# Patient Record
Sex: Female | Born: 1945 | Race: Asian | Hispanic: No | Marital: Married
Health system: Northeastern US, Academic
[De-identification: ages and names within clinical notes are randomized; demographics above are authoritative.]

## PROBLEM LIST (undated history)

## (undated) DIAGNOSIS — I10 Essential (primary) hypertension: Secondary | ICD-10-CM

## (undated) DIAGNOSIS — K219 Gastro-esophageal reflux disease without esophagitis: Secondary | ICD-10-CM

## (undated) DIAGNOSIS — E119 Type 2 diabetes mellitus without complications: Secondary | ICD-10-CM

## (undated) DIAGNOSIS — G629 Polyneuropathy, unspecified: Secondary | ICD-10-CM

## (undated) DIAGNOSIS — I739 Peripheral vascular disease, unspecified: Secondary | ICD-10-CM

## (undated) DIAGNOSIS — Z794 Long term (current) use of insulin: Secondary | ICD-10-CM

## (undated) DIAGNOSIS — E782 Mixed hyperlipidemia: Secondary | ICD-10-CM

## (undated) DIAGNOSIS — M1990 Unspecified osteoarthritis, unspecified site: Secondary | ICD-10-CM

## (undated) DIAGNOSIS — C801 Malignant (primary) neoplasm, unspecified: Secondary | ICD-10-CM

## (undated) DIAGNOSIS — Z923 Personal history of irradiation: Secondary | ICD-10-CM

## (undated) HISTORY — PX: SHOULDER ARTHROSCOPY: SHX128

## (undated) HISTORY — DX: Peripheral vascular disease, unspecified: I73.9

## (undated) HISTORY — DX: Essential (primary) hypertension: I10

## (undated) HISTORY — DX: Gastro-esophageal reflux disease without esophagitis: K21.9

## (undated) HISTORY — PX: SHOULDER OPEN ROTATOR CUFF REPAIR: SHX2407

---

## 1990-08-10 HISTORY — PX: VAGINAL HYSTERECTOMY: SUR661

## 1998-08-10 HISTORY — PX: BREAST BIOPSY: SHX20

## 1999-06-05 ENCOUNTER — Encounter: Payer: Self-pay | Admitting: General Surgery

## 1999-06-05 ENCOUNTER — Encounter (INDEPENDENT_AMBULATORY_CARE_PROVIDER_SITE_OTHER): Payer: Self-pay

## 1999-06-05 ENCOUNTER — Ambulatory Visit (HOSPITAL_COMMUNITY): Admission: RE | Admit: 1999-06-05 | Discharge: 1999-06-05 | Payer: Self-pay | Admitting: General Surgery

## 1999-06-17 ENCOUNTER — Ambulatory Visit (HOSPITAL_COMMUNITY): Admission: RE | Admit: 1999-06-17 | Discharge: 1999-06-17 | Payer: Self-pay | Admitting: General Surgery

## 1999-06-17 ENCOUNTER — Encounter: Payer: Self-pay | Admitting: General Surgery

## 1999-06-17 ENCOUNTER — Encounter (INDEPENDENT_AMBULATORY_CARE_PROVIDER_SITE_OTHER): Payer: Self-pay | Admitting: Specialist

## 1999-12-18 ENCOUNTER — Encounter: Admission: RE | Admit: 1999-12-18 | Discharge: 1999-12-18 | Payer: Self-pay | Admitting: Family Medicine

## 1999-12-18 ENCOUNTER — Encounter: Payer: Self-pay | Admitting: Family Medicine

## 2000-07-28 ENCOUNTER — Ambulatory Visit (HOSPITAL_BASED_OUTPATIENT_CLINIC_OR_DEPARTMENT_OTHER): Admission: RE | Admit: 2000-07-28 | Discharge: 2000-07-28 | Payer: Self-pay | Admitting: Orthopedic Surgery

## 2000-09-16 ENCOUNTER — Encounter: Admission: RE | Admit: 2000-09-16 | Discharge: 2000-12-15 | Payer: Self-pay | Admitting: Internal Medicine

## 2000-12-28 ENCOUNTER — Ambulatory Visit (HOSPITAL_COMMUNITY): Admission: RE | Admit: 2000-12-28 | Discharge: 2000-12-28 | Payer: Self-pay | Admitting: Family Medicine

## 2000-12-28 ENCOUNTER — Encounter: Payer: Self-pay | Admitting: Unknown Physician Specialty

## 2001-12-29 ENCOUNTER — Encounter: Payer: Self-pay | Admitting: Family Medicine

## 2001-12-29 ENCOUNTER — Ambulatory Visit (HOSPITAL_COMMUNITY): Admission: RE | Admit: 2001-12-29 | Discharge: 2001-12-29 | Payer: Self-pay | Admitting: Family Medicine

## 2002-06-16 ENCOUNTER — Ambulatory Visit (HOSPITAL_BASED_OUTPATIENT_CLINIC_OR_DEPARTMENT_OTHER): Admission: RE | Admit: 2002-06-16 | Discharge: 2002-06-16 | Payer: Self-pay | Admitting: Orthopedic Surgery

## 2003-01-04 ENCOUNTER — Ambulatory Visit (HOSPITAL_COMMUNITY): Admission: RE | Admit: 2003-01-04 | Discharge: 2003-01-04 | Payer: Self-pay | Admitting: *Deleted

## 2003-01-04 ENCOUNTER — Encounter: Payer: Self-pay | Admitting: *Deleted

## 2003-02-26 ENCOUNTER — Ambulatory Visit (HOSPITAL_COMMUNITY): Admission: RE | Admit: 2003-02-26 | Discharge: 2003-02-26 | Payer: Self-pay | Admitting: Gastroenterology

## 2005-06-19 ENCOUNTER — Ambulatory Visit (HOSPITAL_COMMUNITY): Admission: RE | Admit: 2005-06-19 | Discharge: 2005-06-19 | Payer: Self-pay | Admitting: *Deleted

## 2005-08-10 HISTORY — PX: WRIST SURGERY: SHX841

## 2005-10-06 ENCOUNTER — Ambulatory Visit: Payer: Self-pay | Admitting: Internal Medicine

## 2005-10-23 ENCOUNTER — Emergency Department (HOSPITAL_COMMUNITY): Admission: EM | Admit: 2005-10-23 | Discharge: 2005-10-23 | Payer: Self-pay | Admitting: Emergency Medicine

## 2008-03-15 ENCOUNTER — Ambulatory Visit (HOSPITAL_COMMUNITY): Admission: RE | Admit: 2008-03-15 | Discharge: 2008-03-15 | Payer: Self-pay | Admitting: Family Medicine

## 2009-04-04 ENCOUNTER — Ambulatory Visit (HOSPITAL_COMMUNITY): Admission: RE | Admit: 2009-04-04 | Discharge: 2009-04-04 | Payer: Self-pay | Admitting: Family Medicine

## 2010-04-02 ENCOUNTER — Ambulatory Visit (HOSPITAL_COMMUNITY): Admission: RE | Admit: 2010-04-02 | Discharge: 2010-04-02 | Payer: Self-pay | Admitting: Family Medicine

## 2010-09-17 ENCOUNTER — Other Ambulatory Visit: Payer: Self-pay | Admitting: Family Medicine

## 2010-09-17 DIAGNOSIS — N6310 Unspecified lump in the right breast, unspecified quadrant: Secondary | ICD-10-CM

## 2010-09-23 ENCOUNTER — Ambulatory Visit
Admission: RE | Admit: 2010-09-23 | Discharge: 2010-09-23 | Disposition: A | Discharge status: Home / Self Care | Payer: Federal, State, Local not specified - PPO | Source: Ambulatory Visit | Attending: Family Medicine | Admitting: Family Medicine

## 2010-09-23 ENCOUNTER — Other Ambulatory Visit: Payer: Self-pay | Admitting: Family Medicine

## 2010-09-23 DIAGNOSIS — N6310 Unspecified lump in the right breast, unspecified quadrant: Secondary | ICD-10-CM

## 2010-12-26 NOTE — Op Note (Signed)
   NAMEHIRAL, Gloria Mckinney                           ACCOUNT NO.:  [id number]   MEDICAL RECORD NO.:  [id number]                   PATIENT TYPE:  AMB   LOCATION:  ENDO                                 FACILITY:  [facility]   PHYSICIAN:  Anselmo Rod, M.D.               DATE OF BIRTH:  June 18, 1946   DATE OF PROCEDURE:  02/26/2003  DATE OF DISCHARGE:                                 OPERATIVE REPORT   PROCEDURE PERFORMED:  Esophagogastroduodenoscopy with biopsy.   ENDOSCOPIST:  Charna Elizabeth, M.D.   INSTRUMENT USED:  Olympus video panendoscope.   INDICATIONS FOR PROCEDURE:  The patient is a 65 year old Asian female with a  history of iron deficiency anemia.  Rule out peptic ulcer disease,  esophagitis, gastritis, etc.   PREPROCEDURE PREPARATION:  Informed consent was procured from the patient.  The patient was fasted for eight hours prior to the procedure.   PREPROCEDURE PHYSICAL:  The patient had stable vital signs.  Neck supple,  chest clear to auscultation.  S1, S2 regular.  Abdomen soft with normal  bowel sounds.   DESCRIPTION OF PROCEDURE:  The patient was placed in the left lateral  decubitus position and sedated with 50 mg of Demerol and 5 mg of Versed  intravenously.  Once the patient was adequately sedated and maintained on  low-flow oxygen and continuous cardiac monitoring, the Olympus video  panendoscope was advanced through the mouth piece over the tongue into the  esophagus under direct vision.  The entire esophagus appeared normal with no  evidence of ring, stricture, masses, esophagitis or Barrett's mucosa.  The  scope was then advanced to the stomach.  The entire gastric mucosa appeared  normal with no evidence of erosions, ulcerations, masses or polyps.  Retroflexion in the high cardia revealed no abnormalities.  The duodenal  bulb and the proximal small bowel distal to the bulb up to 16 cm appeared  normal.  There was no outlet obstruction.   IMPRESSION:  Normal  esophagogastroduodenoscopy.    RECOMMENDATIONS:  Proceed with a colonoscopy at this time.  Further  recommendations made after colonoscopy.                                                Anselmo Rod, M.D.    JNM/MEDQ  D:  02/26/2003  T:  02/26/2003  Job:  [id number]   cc:   Gabriel Earing, M.D.  [address]  [place]  [place] [address]  Fax: [phone]

## 2010-12-26 NOTE — Op Note (Signed)
   [name], Gloria Mckinney                           ACCOUNT NO.:  [id number]   MEDICAL RECORD NO.:  [id number]                   PATIENT TYPE:  AMB   LOCATION:  ENDO                                 FACILITY:  [facility]   PHYSICIAN:  Anselmo Rod, M.D.               DATE OF BIRTH:  21-Jul-1946   DATE OF PROCEDURE:  02/26/2003  DATE OF DISCHARGE:                                 OPERATIVE REPORT   PROCEDURE:  Screening colonoscopy.   ENDOSCOPIST:  Anselmo Rod, M.D.   INSTRUMENT USED:  Olympus video colonoscope.   INDICATIONS FOR PROCEDURE:  Iron deficiency anemia in a 65 year old [place] female, rule out colonic polyps, masses, etc.   PREPROCEDURE PREPARATION:  Informed consent was obtained from the patient  and the patient was  fasted for 8 hours prior to  the procedure and prepped  with a bottle of magnesium citrate and a gallon of GoLYTELY the  night prior  to the procedure.   PREPROCEDURE PHYSICAL:  The patient had stable vital signs. Neck supple.  Chest clear to auscultation, normal S1, S2. Abdomen soft with normoactive  bowel sounds.   DESCRIPTION OF PROCEDURE:  The patient was placed in the left lateral  decubitus position and sedated with Demerol and Versed for the EGD; no  additional sedation was used for the colonoscopy.   Once the patient was adequately positioned the Olympus video colonoscope was  advanced into the rectum to the cecum with slight difficulty. There was some  residual stool  on the right side of the colon. The appendiceal orifice and  ileocecal valve were clearly visualized and photographed. No masses, polyps,  erosions or ulcerations were seen. There was no evidence of diverticulosis.  Small  internal hemorrhoids were seen on retroflexion in the rectum.   IMPRESSION:  Normal colonoscopy except for small internal hemorrhoids. No  masses or polyps seen.    RECOMMENDATIONS:  1. Repeat CBCs will be done on an outpatient basis and iron  supplementation     provided if needed.  2. Repeat colorectal cancer screening in the next 10 years unless the     patient develops any abnormal symptoms in the interim.                                               Anselmo Rod, M.D.    JNM/MEDQ  D:  02/26/2003  T:  02/26/2003  Job:  [id number]   cc:   Gabriel Earing, M.D.  [address]  [place]  [place] [address]  Fax: [phone]

## 2010-12-26 NOTE — Op Note (Signed)
[place]  Patient:    Gloria Mckinney, Gloria Mckinney                        MRN: [id number] Proc. Date: 07/28/00 Adm. Date:  [id number] Attending:  Milly Jakob                           Operative Report  PREOPERATIVE DIAGNOSES:  Rotator cuff tear with impingement, acromioclavicular joint arthritis.  POSTOPERATIVE DIAGNOSES: 1. Rotator cuff tear with impingement, acromioclavicular joint arthritis. 2. Superior labral tearing anterior to posterior.  PRINCIPAL PROCEDURES: 1. Mini-open rotator cuff repair with three Mitek suture anchors. 2. Arthroscopic evaluation and subacromial decompression. 3. Debridement of superior labrum tear anterior to posterior and the    glenohumeral joint. 4. Partial distal clavicle excision.  SURGEON:  Harvie Junior, M.D.  ASSISTANT:  Currie Paris. Thedore Mins.  ANESTHESIA:  General.  BRIEF HISTORY:  This is a 65 year old female with a long history of having had significant right shoulder pain and weakness.  She had been in evaluated and felt to have significant weakness.  An injection had helped her pain some, but did not eliminate it.  Because of continued complaints of pain and weakness, she was sent through physical therapy.  When this failed, she was ultimately taken to the operating room for evaluation under anesthesia and arthroscopy.  PROCEDURE:  Patient taken to the operating room and after adequate anesthesia was obtained with general anesthetic, the patient placed supine on the operating table. She was then moved into the beach chair position.  All bony prominences were well-padded and care taken to position her without any pressure.  At this point, the right arm was prepped and draped in usual sterile fashion and following this, routine arthroscopic examination of the shoulder revealed there was a tear of the superior labrum anterior to posterior with fraying of the biceps tendon.  This was debrided back  to bleeding tissue within the glenohumeral joint.  Attention was then turned to the rotator cuff undersurface, which showed a connection to bone, but it was clear that this was floppy and it seemed like scar tissue.  The actual end of the rotator cuff could be identified undersurface.  No frank hole was seen to the undersurface. Attention was then turned superiorly, where there was noted to be an obvious defect in the rotator cuff.  An acromioplasty was performed. Anterolateral acromioplasty from both the anterior and side portal.  Following this, a distal clavicle excision was performed from an anterior portal of about 17 mm. Attention was then turned to the rotator cuff tear, where the camera was then removed from the shoulder joint.  Attention was then turned towards a mini-open.  Incision was made at the lateral aspect of the deltoid linear.  Subcutaneous tissues were taken down to level of the deltoid muscle, which was divided in line with its fibers.  The rotator cuff tear was identified, debrided, the tendinopathy was fairly consistent and showed that there was poor healing at the bone both in front and back of the rotator cuff tear.  This poor tendinous healing was resected and the attachment to bone was burred, three Superquick suture anchors were then used and the rotator cuff was repaired to this bleeding, bony bed.  Following this, the shoulder was put through a range of motion.  There was no tendency towards pull or stress on the rotator  cuff with the arm at the side.  The shoulder was then copiously irrigated and suctioned dry.  The deltoid muscle was then closed with 0 Vicryl interrupted suture.  Skin was then closed with a combination of 0 and 2-0 Vicryl and the skin with a 4-0 Prolene pullout suture.  Benzoin and Steri-Strips were applied.  A sterile compressive dressing was applied and the patient was placed into a shoulder sling.  She was taken to the recovery room, where  she was noted to be in satisfactory condition.  Estimated blood loss for the procedure was none. DD:  07/28/00 TD:  07/28/00 Job: [id number]

## 2010-12-26 NOTE — Op Note (Signed)
NAME:  Gloria Mckinney, Gloria Mckinney                           ACCOUNT NO.:  [id number]   MEDICAL RECORD NO.:  [id number]                   PATIENT TYPE:  AMB   LOCATION:  DSC                                  FACILITY:  [facility]   PHYSICIAN:  Harvie Junior, M.D.                DATE OF BIRTH:  [birth year]   DATE OF PROCEDURE:  DATE OF DISCHARGE:                                 OPERATIVE REPORT   PREOPERATIVE DIAGNOSES:  1. Impingement.  2. Acromioclavicular joint arthritis.  3. Suspected rotator cuff tear.   POSTOPERATIVE DIAGNOSES:  1. Impingement.  2. Acromioclavicular joint arthritis.  3. Anterior superior labral tear.   PROCEDURES:  1. Anterolateral acromioplasty.  2. Distal clavicle resection.  3. Debridement of anterior labral tear.   SURGEON:  Harvie Junior, M.D.   ASSISTANT:  Marshia Ly, P.A.   ANESTHESIA:  General.   BRIEF HISTORY:  The patient is a 65 year old female with a long history of  having right shoulder pain, ultimately had a full-thickness rotator cuff  tear, which was fixed through a mini-open approach.  She did well from this  repair and began having left shoulder pain.  She had a type 2 acromion  radiographically, failed all conservative care, and because of continued  complaints of pain came in for evaluation of the left shoulder.   DESCRIPTION OF PROCEDURE:  The patient was taken to the operating room and  after adequate anesthesia was obtained with a block, the patient was placed  supine on the operating table.  She was then moved to the beach chair  position, all bony prominences well-padded.  At this point attention was  turned into the glenohumeral joint, where there was noted to be an anterior  superior labral tear.  This was debrided from within the glenohumeral joint.  There was some tightness in the shoulder preoperatively, and this was  released with a manipulation.  The glenohumeral joint appeared to be  relatively open at that point.  Following  this, attention was turned into  the subacromial space, where after a superior labral resection attention was  turned to the subacromial space.  After checking the rotator cuff from  within the glenohumeral joint, it looked to be within normal limits.  At  this time attention was turned to the subacromial space, where there was a  large anterior lateral spur.  An anterior lateral acromioplasty was  performed, followed by distal clavicle resection of approximately 15 mm.  Once this was done, a significant bursectomy was performed from within the  subacromial space, and the rotator cuff was examined thoroughly at this  time.  The rotator cuff showed no evidence of full-thickness tearing and no  evidence of significant bursal side tearing.  At this point the shoulder was  copiously irrigated and suctioned dry.  The arthroscopic portals were closed  with a bandage, a sterile and compressive dressing  was applied, and the  patient was taken to the recovery room, where she was noted to be in  satisfactory condition in an arm sling.  She will be discharged to home, and  she will follow up in one week.                                               Harvie Junior, M.D.    Ranae Plumber  D:  06/16/2002  T:  06/16/2002  Job:  [id number]

## 2012-01-06 ENCOUNTER — Other Ambulatory Visit (HOSPITAL_COMMUNITY): Payer: Self-pay | Admitting: Family Medicine

## 2012-01-06 DIAGNOSIS — Z1231 Encounter for screening mammogram for malignant neoplasm of breast: Secondary | ICD-10-CM

## 2012-01-15 ENCOUNTER — Ambulatory Visit (HOSPITAL_COMMUNITY)
Admission: RE | Admit: 2012-01-15 | Discharge: 2012-01-15 | Disposition: A | Discharge status: Home / Self Care | Payer: Federal, State, Local not specified - PPO | Source: Ambulatory Visit | Attending: Family Medicine | Admitting: Family Medicine

## 2012-01-15 DIAGNOSIS — Z1231 Encounter for screening mammogram for malignant neoplasm of breast: Secondary | ICD-10-CM

## 2013-01-16 ENCOUNTER — Other Ambulatory Visit (HOSPITAL_COMMUNITY): Payer: Self-pay | Admitting: Family Medicine

## 2013-01-16 DIAGNOSIS — Z1231 Encounter for screening mammogram for malignant neoplasm of breast: Secondary | ICD-10-CM

## 2013-01-19 ENCOUNTER — Ambulatory Visit (HOSPITAL_COMMUNITY)
Admission: RE | Admit: 2013-01-19 | Discharge: 2013-01-19 | Disposition: A | Discharge status: Home / Self Care | Payer: Federal, State, Local not specified - PPO | Source: Ambulatory Visit | Attending: Family Medicine | Admitting: Family Medicine

## 2013-01-19 DIAGNOSIS — Z1231 Encounter for screening mammogram for malignant neoplasm of breast: Secondary | ICD-10-CM | POA: Insufficient documentation

## 2013-03-16 ENCOUNTER — Ambulatory Visit
Admission: RE | Admit: 2013-03-16 | Discharge: 2013-03-16 | Disposition: A | Discharge status: Home / Self Care | Payer: Federal, State, Local not specified - PPO | Source: Ambulatory Visit | Attending: Family Medicine | Admitting: Family Medicine

## 2013-03-16 ENCOUNTER — Other Ambulatory Visit: Payer: Self-pay | Admitting: Family Medicine

## 2013-03-16 DIAGNOSIS — R109 Unspecified abdominal pain: Secondary | ICD-10-CM

## 2013-12-25 ENCOUNTER — Other Ambulatory Visit (HOSPITAL_COMMUNITY): Payer: Self-pay | Admitting: Family Medicine

## 2013-12-25 DIAGNOSIS — Z1231 Encounter for screening mammogram for malignant neoplasm of breast: Secondary | ICD-10-CM

## 2014-01-22 ENCOUNTER — Ambulatory Visit (HOSPITAL_COMMUNITY)
Admission: RE | Admit: 2014-01-22 | Discharge: 2014-01-22 | Disposition: A | Discharge status: Home / Self Care | Payer: Federal, State, Local not specified - PPO | Source: Ambulatory Visit | Attending: Family Medicine | Admitting: Family Medicine

## 2014-01-22 ENCOUNTER — Ambulatory Visit (HOSPITAL_COMMUNITY): Payer: Federal, State, Local not specified - PPO

## 2014-01-22 DIAGNOSIS — Z1231 Encounter for screening mammogram for malignant neoplasm of breast: Secondary | ICD-10-CM | POA: Insufficient documentation

## 2014-10-19 ENCOUNTER — Other Ambulatory Visit (HOSPITAL_COMMUNITY): Payer: Self-pay | Admitting: Family Medicine

## 2014-10-19 DIAGNOSIS — Z1231 Encounter for screening mammogram for malignant neoplasm of breast: Secondary | ICD-10-CM

## 2015-02-22 ENCOUNTER — Ambulatory Visit (HOSPITAL_COMMUNITY)
Admission: RE | Admit: 2015-02-22 | Discharge: 2015-02-22 | Disposition: A | Discharge status: Home / Self Care | Payer: Federal, State, Local not specified - PPO | Source: Ambulatory Visit | Attending: Family Medicine | Admitting: Family Medicine

## 2015-02-22 DIAGNOSIS — Z1231 Encounter for screening mammogram for malignant neoplasm of breast: Secondary | ICD-10-CM | POA: Diagnosis not present

## 2015-12-18 ENCOUNTER — Other Ambulatory Visit: Payer: Self-pay

## 2015-12-18 DIAGNOSIS — Z1231 Encounter for screening mammogram for malignant neoplasm of breast: Secondary | ICD-10-CM

## 2016-03-25 ENCOUNTER — Ambulatory Visit: Payer: Federal, State, Local not specified - PPO

## 2016-03-31 ENCOUNTER — Ambulatory Visit
Admission: RE | Admit: 2016-03-31 | Discharge: 2016-03-31 | Disposition: A | Discharge status: Home / Self Care | Payer: Federal, State, Local not specified - PPO | Source: Ambulatory Visit

## 2016-03-31 DIAGNOSIS — Z1231 Encounter for screening mammogram for malignant neoplasm of breast: Secondary | ICD-10-CM

## 2016-09-25 ENCOUNTER — Telehealth: Payer: Self-pay | Admitting: *Deleted

## 2016-09-25 ENCOUNTER — Telehealth: Payer: Self-pay

## 2016-09-25 NOTE — Telephone Encounter (Signed)
Notes sent to scheduling.   

## 2016-09-25 NOTE — Telephone Encounter (Signed)
SENT NOTES TO SCHELDING 

## 2016-10-01 ENCOUNTER — Telehealth: Payer: Self-pay | Admitting: Cardiology

## 2016-10-01 NOTE — Telephone Encounter (Signed)
Received records from [facility] for appoiintment on 10/20/16 with Dr Percival Spanish.  Records put with Dr Hochrein's schedule for 10/20/16. lp

## 2016-10-20 ENCOUNTER — Encounter: Payer: Self-pay | Admitting: Cardiology

## 2016-10-20 ENCOUNTER — Ambulatory Visit (INDEPENDENT_AMBULATORY_CARE_PROVIDER_SITE_OTHER): Payer: Federal, State, Local not specified - PPO | Admitting: Cardiology

## 2016-10-20 VITALS — BP 144/88 | HR 77 | Ht 62.0 in | Wt 177.0 lb

## 2016-10-20 DIAGNOSIS — E785 Hyperlipidemia, unspecified: Secondary | ICD-10-CM

## 2016-10-20 DIAGNOSIS — R0609 Other forms of dyspnea: Secondary | ICD-10-CM | POA: Diagnosis not present

## 2016-10-20 DIAGNOSIS — E119 Type 2 diabetes mellitus without complications: Secondary | ICD-10-CM | POA: Diagnosis not present

## 2016-10-20 NOTE — Progress Notes (Signed)
Cardiology Office Note   Date:  10/21/2016   ID:  Gloria Mckinney, DOB Oct 31, 1945, MRN [id number]  PCP:  Tia Alert, PA  Cardiologist:   Minus Breeding, MD  Referring:  Tia Alert, PA  Chief Complaint  Patient presents with  . Shortness of Breath      History of Present Illness: Gloria Mckinney is a 71 y.o. female who presents for Evaluation of shortness of breath. She has not had any prior cardiac history although she apparently saw another cardiologist some years ago but I don't have this workup and she doesn't recall any details. She was having some shortness of breath. This seemed to happen after she was given a pain medication by one of her orthopedist. She doesn't recall the name. She took about 20 days worth of this and this at the recommendation of her daughter who is a Software engineer. She says that since stopping that in late February or breathing is better. She does do some walking for exercise. She denies any PND or orthopnea. She's had no palpitations, presyncope or syncope. She denies any chest pressure, neck or arm discomfort. She apparently  carotid bruits in the past but has not wanted Dopplers per notes that I can repeat. She's never had any other testing that she recalls. She does have household chores without significant limitations. She does have diabetes that has not been adequately controlled with hemoglobin A1c of 8.    Past Medical History:  Diagnosis Date  . Arthritis   . Diabetes mellitus without complication ([place])   . GERD (gastroesophageal reflux disease)   . Hyperlipidemia   . Hypertension   . PVD (peripheral vascular disease) ([place])     Past Surgical History:  Procedure Laterality Date  . ROTATOR CUFF REPAIR Bilateral   . VAGINAL HYSTERECTOMY    . WRIST SURGERY  2007     Current Outpatient Prescriptions  Medication Sig Dispense Refill  . aspirin EC 81 MG tablet Take 81 mg by mouth daily.    . Cholecalciferol (VITAMIN D3 PO) Take 600  mg by mouth daily.    Marland Kitchen EXFORGE 5-320 MG tablet Take 1 tablet by mouth daily.    . furosemide (LASIX) 40 MG tablet Take 40 mg by mouth.    . hydrochlorothiazide (HYDRODIURIL) 25 MG tablet Take 25 mg by mouth daily.    . IRON PO Take 65 mg by mouth daily.    . metFORMIN (GLUCOPHAGE) 1000 MG tablet Take 1,000 mg by mouth 2 (two) times daily.    Marland Kitchen omeprazole (PRILOSEC) 40 MG capsule Take 40 mg by mouth daily.    . simvastatin (ZOCOR) 40 MG tablet Take 40 mg by mouth daily.     No current facility-administered medications for this visit.     Allergies:   Cephalexin and Statins    Social History:  The patient  reports that she has never smoked. She has never used smokeless tobacco.   Family History:  The patient's family history includes Asthma in her father; CVA in her mother; Diabetes in her sister, sister, and sister; Heart disease in her brother and mother; Hypertension in her mother, sister, sister, and sister.    ROS:  Please see the history of present illness.   Otherwise, review of systems are positive for none.   All other systems are reviewed and negative.    PHYSICAL EXAM: VS:  BP (!) 144/88 Comment: Right arm.  Pulse 77   Ht 5\' 2"  (1.575  m)   Wt 177 lb (80.3 kg)   BMI 32.37 kg/m  , BMI Body mass index is 32.37 kg/m. GENERAL:  Well appearing HEENT:  Pupils equal round and reactive, fundi not visualized, oral mucosa unremarkable NECK:  No jugular venous distention, waveform within normal limits, carotid upstroke brisk and symmetric, soft carotid bruits, no thyromegaly LYMPHATICS:  No cervical, inguinal adenopathy LUNGS:  Clear to auscultation bilaterally BACK:  No CVA tenderness CHEST:  Unremarkable HEART:  PMI not displaced or sustained,S1 and S2 within normal limits, no S3, no S4, no clicks, no rubs, no murmurs ABD:  Flat, positive bowel sounds normal in frequency in pitch, no bruits, no rebound, no guarding, no midline pulsatile mass, no hepatomegaly, no  splenomegaly EXT:  2 plus pulses throughout, trace edema, no cyanosis no clubbing SKIN:  No rashes no nodules NEURO:  Cranial nerves II through XII grossly intact, motor grossly intact throughout PSYCH:  Cognitively intact, oriented to person place and time    EKG:  EKG is ordered today. The ekg ordered today demonstrates sinus rhythm, rate 77, axis within normal limits, intervals within normal limits, poor anterior R wave progression, no acute ST-T wave changes.   Recent Labs: No results found for requested labs within last 8760 hours.    Lipid Panel No results found for: CHOL, TRIG, HDL, CHOLHDL, VLDL, LDLCALC, LDLDIRECT    Wt Readings from Last 3 Encounters:  10/20/16 177 lb (80.3 kg)      Other studies Reviewed: Additional studies/ records that were reviewed today include: Office records. Review of the above records demonstrates:  Please see elsewhere in the note.      ASSESSMENT AND PLAN:  DYSPNEA: This seems to have improved. However, she does have cardiovascular risk factors. I will bring the patient back for a POET (Plain Old Exercise Test). This will allow me to screen for obstructive coronary disease, risk stratify and very importantly provide a prescription for exercise.  HTN:  The blood pressure is at target. No change in medications is indicated. We will continue with therapeutic lifestyle changes (TLC).  DM:  Per Dr. Stephanie Acre.  The patient and I did discuss that she needs better control.  I reviewed her most recent A1C and she is not at goal.     Current medicines are reviewed at length with the patient today.  The patient does not have concerns regarding medicines.  The following changes have been made:  no change  Labs/ tests ordered today include:   Orders Placed This Encounter  Procedures  . EXERCISE TOLERANCE TEST  . EKG 12-Lead     Disposition:   FU with me as needed.      Signed, Minus Breeding, MD  10/21/2016 1:00 PM    [place]

## 2016-10-20 NOTE — Patient Instructions (Signed)

## 2016-10-21 ENCOUNTER — Encounter: Payer: Self-pay | Admitting: Cardiology

## 2016-10-21 DIAGNOSIS — R0609 Other forms of dyspnea: Secondary | ICD-10-CM | POA: Insufficient documentation

## 2016-10-21 DIAGNOSIS — E119 Type 2 diabetes mellitus without complications: Secondary | ICD-10-CM | POA: Insufficient documentation

## 2016-10-21 DIAGNOSIS — E785 Hyperlipidemia, unspecified: Secondary | ICD-10-CM | POA: Insufficient documentation

## 2016-10-27 ENCOUNTER — Telehealth (HOSPITAL_COMMUNITY): Payer: Self-pay

## 2016-10-27 NOTE — Telephone Encounter (Signed)
I did reach pt through interpreter services-language line. Pt speaks English and I did not have any difficulty speaking with her. Encounter complete.

## 2016-10-30 ENCOUNTER — Ambulatory Visit (HOSPITAL_COMMUNITY)
Admission: RE | Admit: 2016-10-30 | Discharge: 2016-10-30 | Disposition: A | Discharge status: Home / Self Care | Payer: Federal, State, Local not specified - PPO | Source: Ambulatory Visit | Attending: Cardiology | Admitting: Cardiology

## 2016-10-30 DIAGNOSIS — R0609 Other forms of dyspnea: Secondary | ICD-10-CM

## 2016-11-03 LAB — EXERCISE TOLERANCE TEST
Exercise duration (min): 6 min
Exercise duration (sec): 4 s
Percent HR: 92 %

## 2017-02-22 ENCOUNTER — Other Ambulatory Visit: Payer: Self-pay | Admitting: Family Medicine

## 2017-02-22 DIAGNOSIS — Z1231 Encounter for screening mammogram for malignant neoplasm of breast: Secondary | ICD-10-CM

## 2017-04-01 ENCOUNTER — Ambulatory Visit
Admission: RE | Admit: 2017-04-01 | Discharge: 2017-04-01 | Disposition: A | Discharge status: Home / Self Care | Payer: Federal, State, Local not specified - PPO | Source: Ambulatory Visit | Attending: Family Medicine | Admitting: Family Medicine

## 2017-04-01 DIAGNOSIS — Z1231 Encounter for screening mammogram for malignant neoplasm of breast: Secondary | ICD-10-CM

## 2017-09-06 ENCOUNTER — Other Ambulatory Visit: Payer: Self-pay | Admitting: Family Medicine

## 2017-09-06 DIAGNOSIS — R229 Localized swelling, mass and lump, unspecified: Principal | ICD-10-CM

## 2017-09-06 DIAGNOSIS — IMO0002 Reserved for concepts with insufficient information to code with codable children: Secondary | ICD-10-CM

## 2017-09-10 ENCOUNTER — Ambulatory Visit
Admission: RE | Admit: 2017-09-10 | Discharge: 2017-09-10 | Disposition: A | Discharge status: Home / Self Care | Payer: Federal, State, Local not specified - PPO | Source: Ambulatory Visit | Attending: Family Medicine | Admitting: Family Medicine

## 2017-09-10 ENCOUNTER — Other Ambulatory Visit: Payer: Federal, State, Local not specified - PPO

## 2017-09-10 DIAGNOSIS — IMO0002 Reserved for concepts with insufficient information to code with codable children: Secondary | ICD-10-CM

## 2017-09-10 DIAGNOSIS — R229 Localized swelling, mass and lump, unspecified: Principal | ICD-10-CM

## 2017-11-08 DIAGNOSIS — I160 Hypertensive urgency: Secondary | ICD-10-CM | POA: Diagnosis present

## 2017-11-08 DIAGNOSIS — E782 Mixed hyperlipidemia: Secondary | ICD-10-CM | POA: Diagnosis present

## 2018-05-05 ENCOUNTER — Other Ambulatory Visit: Payer: Self-pay | Admitting: Family Medicine

## 2018-05-05 DIAGNOSIS — Z1231 Encounter for screening mammogram for malignant neoplasm of breast: Secondary | ICD-10-CM

## 2018-06-06 ENCOUNTER — Ambulatory Visit: Payer: Federal, State, Local not specified - PPO

## 2018-06-28 ENCOUNTER — Other Ambulatory Visit: Payer: Self-pay | Admitting: Orthopedic Surgery

## 2018-07-04 ENCOUNTER — Ambulatory Visit
Admission: RE | Admit: 2018-07-04 | Discharge: 2018-07-04 | Disposition: A | Discharge status: Home / Self Care | Payer: Federal, State, Local not specified - PPO | Source: Ambulatory Visit | Attending: Family Medicine | Admitting: Family Medicine

## 2018-07-04 DIAGNOSIS — Z1231 Encounter for screening mammogram for malignant neoplasm of breast: Secondary | ICD-10-CM

## 2018-07-06 ENCOUNTER — Other Ambulatory Visit: Payer: Self-pay | Admitting: Family Medicine

## 2018-07-06 ENCOUNTER — Other Ambulatory Visit (HOSPITAL_COMMUNITY): Payer: Self-pay | Admitting: *Deleted

## 2018-07-06 DIAGNOSIS — R928 Other abnormal and inconclusive findings on diagnostic imaging of breast: Secondary | ICD-10-CM

## 2018-07-06 NOTE — Patient Instructions (Addendum)
Gloria Mckinney  07/06/2018   Your procedure is scheduled on: 07-22-18  Report to [facility] Main  Entrance,  Report to admitting at  10:00 AM    Call this number if you have problems the morning of surgery [phone]        Remember: Do not eat food or drink liquids :After Midnight.                                    BRUSH YOUR TEETH MORNING OF SURGERY AND RINSE YOUR MOUTH OUT, NO CHEWING GUM CANDY OR MINTS.   _________________________________________________________________________________________________   How to Manage Your Diabetes Before and After Surgery    Why is it important to control my blood sugar before and after surgery? . Improving blood sugar levels before and after surgery helps healing and can limit problems. . A way of improving blood sugar control is eating a healthy diet by: o  Eating less sugar and carbohydrates o  Increasing activity/exercise o  Talking with your doctor about reaching your blood sugar goals . High blood sugars (greater than 180 mg/dL) can raise your risk of infections and slow your recovery, so you will need to focus on controlling your diabetes during the weeks before surgery. . Make sure that the doctor who takes care of your diabetes knows about your planned surgery including the date and location.  How do I manage my blood sugar before surgery? . Check your blood sugar at least 4 times a day, starting 2 days before surgery, to make sure that the level is not too high or low. o Check your blood sugar the morning of your surgery when you wake up and every 2 hours until you get to the Short Stay unit. . If your blood sugar is less than 70 mg/dL, you will need to treat for low blood sugar: o Do not take insulin. o Treat a low blood sugar (less than 70 mg/dL) with  cup of clear juice (cranberry or apple), 4 glucose tablets, OR glucose gel. o Recheck blood sugar in 15 minutes after treatment (to  make sure it is greater than 70 mg/dL). If your blood sugar is not greater than 70 mg/dL on recheck, call [phone] for further instructions. . Report your blood sugar to the short stay nurse when you get to Short Stay.  . If you are admitted to the hospital after surgery: o Your blood sugar will be checked by the staff and you will probably be given insulin after surgery (instead of oral diabetes medicines) to make sure you have good blood sugar levels. o The goal for blood sugar control after surgery is 80-180 mg/dL.   WHAT DO I DO ABOUT MY DIABETES MEDICATION?  . TAKE 1/2 OF YOUR EVENING DOSE OF TOUJEO ON 07-21-18 ( TAKE 25 UNITS)  . THE DAY  BEFORE SURGERY TAKE YOUR METFORMIN AS USUAL     . THE MORNING OF SURGERY DO NOT TAKE YOUR METFORMIN  .       Patient Signature:  Date:   Nurse Signature:  Date:   Reviewed and Endorsed by [facility] Patient Education Committee, August 2015   Take these medicines the morning of surgery with A SIP OF WATER: OMEPRAZOLE (PRILOSEC) DO NOT TAKE ANY DIABETIC MEDICATIONS DAY OF YOUR SURGERY  You may not have any metal on your body including hair pins and              piercings  Do not wear jewelry, make-up, lotions, powders or perfumes, deodorant             Do not wear nail polish.  Do not shave  48 hours prior to surgery.                Do not bring valuables to the hospital. [place].  Contacts, dentures or bridgework may not be worn into surgery.  Leave suitcase in the car. After surgery it may be brought to your room.                   Please read over the following fact sheets you were given: _____________________________________________________________________             [facility] - Preparing for Surgery Before surgery, you can play an important role.  Because skin is not sterile, your skin needs to be as free of germs as possible.  You can  reduce the number of germs on your skin by washing with CHG (chlorahexidine gluconate) soap before surgery.  CHG is an antiseptic cleaner which kills germs and bonds with the skin to continue killing germs even after washing. Please DO NOT use if you have an allergy to CHG or antibacterial soaps.  If your skin becomes reddened/irritated stop using the CHG and inform your nurse when you arrive at Short Stay. Do not shave (including legs and underarms) for at least 48 hours prior to the first CHG shower.  You may shave your face/neck. Please follow these instructions carefully:  1.  Shower with CHG Soap the night before surgery and the  morning of Surgery.  2.  If you choose to wash your hair, wash your hair first as usual with your  normal  shampoo.  3.  After you shampoo, rinse your hair and body thoroughly to remove the  shampoo.                            4.  Use CHG as you would any other liquid soap.  You can apply chg directly  to the skin and wash                       Gently with a scrungie or clean washcloth.  5.  Apply the CHG Soap to your body ONLY FROM THE NECK DOWN.   Do not use on face/ open                           Wound or open sores. Avoid contact with eyes, ears mouth and genitals (private parts).                       Wash face,  Genitals (private parts) with your normal soap.             6.  Wash thoroughly, paying special attention to the area where your surgery  will be performed.  7.  Thoroughly rinse your body with warm water from the neck down.  8.  DO NOT shower/wash with your normal soap after using and rinsing off  the CHG Soap.             9.  Pat yourself dry with a clean towel.            10.  Wear clean pajamas.            11.  Place clean sheets on your bed the night of your first shower and do not  sleep with pets. Day of Surgery : Do not apply any lotions/deodorants the morning of surgery.  Please wear clean clothes to the hospital/surgery  center.  ________________________________________________________________________   Incentive Spirometer  An incentive spirometer is a tool that can help keep your lungs clear and active. This tool measures how well you are filling your lungs with each breath. Taking long deep breaths may help reverse or decrease the chance of developing breathing (pulmonary) problems (especially infection) following:  A long period of time when you are unable to move or be active. BEFORE THE PROCEDURE   If the spirometer includes an indicator to show your best effort, your nurse or respiratory therapist will set it to a desired goal.  If possible, sit up straight or lean slightly forward. Try not to slouch.  Hold the incentive spirometer in an upright position. INSTRUCTIONS FOR USE  1. Sit on the edge of your bed if possible, or sit up as far as you can in bed or on a chair. 2. Hold the incentive spirometer in an upright position. 3. Breathe out normally. 4. Place the mouthpiece in your mouth and seal your lips tightly around it. 5. Breathe in slowly and as deeply as possible, raising the piston or the ball toward the top of the column. 6. Hold your breath for 3-5 seconds or for as long as possible. Allow the piston or ball to fall to the bottom of the column. 7. Remove the mouthpiece from your mouth and breathe out normally. 8. Rest for a few seconds and repeat Steps 1 through 7 at least 10 times every 1-2 hours when you are awake. Take your time and take a few normal breaths between deep breaths. 9. The spirometer may include an indicator to show your best effort. Use the indicator as a goal to work toward during each repetition. 10. After each set of 10 deep breaths, practice coughing to be sure your lungs are clear. If you have an incision (the cut made at the time of surgery), support your incision when coughing by placing a pillow or rolled up towels firmly against it. Once you are able to get out  of bed, walk around indoors and cough well. You may stop using the incentive spirometer when instructed by your caregiver.  RISKS AND COMPLICATIONS  Take your time so you do not get dizzy or light-headed.  If you are in pain, you may need to take or ask for pain medication before doing incentive spirometry. It is harder to take a deep breath if you are having pain. AFTER USE  Rest and breathe slowly and easily.  It can be helpful to keep track of a log of your progress. Your caregiver can provide you with a simple table to help with this. If you are using the spirometer at home, follow these instructions: [place] IF:   You are having difficultly using the spirometer.  You have trouble using the spirometer as often as instructed.  Your pain medication is not giving enough relief while using the spirometer.  You develop fever of 100.5  F (38.1 C) or higher. SEEK IMMEDIATE MEDICAL CARE IF:   You cough up bloody sputum that had not been present before.  You develop fever of 102 F (38.9 C) or greater.  You develop worsening pain at or near the incision site. MAKE SURE YOU:   Understand these instructions.  Will watch your condition.  Will get help right away if you are not doing well or get worse. Document Released: 12/07/2006 Document Revised: 10/19/2011 Document Reviewed: 02/07/2007 ExitCare Patient Information 2014 ExitCare, [place].   ________________________________________________________________________  WHAT IS A BLOOD TRANSFUSION? Blood Transfusion Information  A transfusion is the replacement of blood or some of its parts. Blood is made up of multiple cells which provide different functions.  Red blood cells carry oxygen and are used for blood loss replacement.  White blood cells fight against infection.  Platelets control bleeding.  Plasma helps clot blood.  Other blood products are available for specialized needs, such as hemophilia or other  clotting disorders. BEFORE THE TRANSFUSION  Who gives blood for transfusions?   Healthy volunteers who are fully evaluated to make sure their blood is safe. This is blood bank blood. Transfusion therapy is the safest it has ever been in the practice of medicine. Before blood is taken from a donor, a complete history is taken to make sure that person has no history of diseases nor engages in risky social behavior (examples are intravenous drug use or sexual activity with multiple partners). The donor's travel history is screened to minimize risk of transmitting infections, such as malaria. The donated blood is tested for signs of infectious diseases, such as HIV and hepatitis. The blood is then tested to be sure it is compatible with you in order to minimize the chance of a transfusion reaction. If you or a relative donates blood, this is often done in anticipation of surgery and is not appropriate for emergency situations. It takes many days to process the donated blood. RISKS AND COMPLICATIONS Although transfusion therapy is very safe and saves many lives, the main dangers of transfusion include:   Getting an infectious disease.  Developing a transfusion reaction. This is an allergic reaction to something in the blood you were given. Every precaution is taken to prevent this. The decision to have a blood transfusion has been considered carefully by your caregiver before blood is given. Blood is not given unless the benefits outweigh the risks. AFTER THE TRANSFUSION  Right after receiving a blood transfusion, you will usually feel much better and more energetic. This is especially true if your red blood cells have gotten low (anemic). The transfusion raises the level of the red blood cells which carry oxygen, and this usually causes an energy increase.  The nurse administering the transfusion will monitor you carefully for complications. HOME CARE INSTRUCTIONS  No special instructions are needed  after a transfusion. You may find your energy is better. Speak with your caregiver about any limitations on activity for underlying diseases you may have. SEEK MEDICAL CARE IF:   Your condition is not improving after your transfusion.  You develop redness or irritation at the intravenous (IV) site. SEEK IMMEDIATE MEDICAL CARE IF:  Any of the following symptoms occur over the next 12 hours:  Shaking chills.  You have a temperature by mouth above 102 F (38.9 C), not controlled by medicine.  Chest, back, or muscle pain.  People around you feel you are not acting correctly or are confused.  Shortness of breath or difficulty breathing.  Dizziness and fainting.  You get a rash or develop hives.  You have a decrease in urine output.  Your urine turns a dark color or changes to pink, red, or brown. Any of the following symptoms occur over the next 10 days:  You have a temperature by mouth above 102 F (38.9 C), not controlled by medicine.  Shortness of breath.  Weakness after normal activity.  The white part of the eye turns yellow (jaundice).  You have a decrease in the amount of urine or are urinating less often.  Your urine turns a dark color or changes to pink, red, or brown. Document Released: 07/24/2000 Document Revised: 10/19/2011 Document Reviewed: 03/12/2008 [facility] Patient Information 2014 [place], [place].  _______________________________________________________________________

## 2018-07-06 NOTE — Progress Notes (Signed)
LOV [facility] 10-20-16 Epic EXERCISE TOLERANCE TEST 10-30-16 EPIC

## 2018-07-13 ENCOUNTER — Other Ambulatory Visit: Payer: Federal, State, Local not specified - PPO

## 2018-07-15 ENCOUNTER — Encounter (HOSPITAL_COMMUNITY)
Admission: RE | Admit: 2018-07-15 | Discharge: 2018-07-15 | Disposition: A | Discharge status: Home / Self Care | Payer: Federal, State, Local not specified - PPO | Source: Ambulatory Visit | Attending: Orthopedic Surgery | Admitting: Orthopedic Surgery

## 2018-07-15 ENCOUNTER — Encounter (HOSPITAL_COMMUNITY): Payer: Self-pay

## 2018-07-15 ENCOUNTER — Ambulatory Visit
Admission: RE | Admit: 2018-07-15 | Discharge: 2018-07-15 | Disposition: A | Discharge status: Home / Self Care | Payer: Federal, State, Local not specified - PPO | Source: Ambulatory Visit | Attending: Family Medicine | Admitting: Family Medicine

## 2018-07-15 ENCOUNTER — Ambulatory Visit (HOSPITAL_COMMUNITY)
Admission: RE | Admit: 2018-07-15 | Discharge: 2018-07-15 | Disposition: A | Discharge status: Home / Self Care | Payer: Federal, State, Local not specified - PPO | Source: Ambulatory Visit | Attending: Orthopedic Surgery | Admitting: Orthopedic Surgery

## 2018-07-15 ENCOUNTER — Other Ambulatory Visit: Payer: Self-pay

## 2018-07-15 ENCOUNTER — Other Ambulatory Visit: Payer: Self-pay | Admitting: Family Medicine

## 2018-07-15 DIAGNOSIS — R928 Other abnormal and inconclusive findings on diagnostic imaging of breast: Secondary | ICD-10-CM

## 2018-07-15 DIAGNOSIS — M1711 Unilateral primary osteoarthritis, right knee: Secondary | ICD-10-CM | POA: Insufficient documentation

## 2018-07-15 DIAGNOSIS — Z01811 Encounter for preprocedural respiratory examination: Secondary | ICD-10-CM

## 2018-07-15 DIAGNOSIS — Z01818 Encounter for other preprocedural examination: Secondary | ICD-10-CM | POA: Diagnosis not present

## 2018-07-15 DIAGNOSIS — R9431 Abnormal electrocardiogram [ECG] [EKG]: Secondary | ICD-10-CM | POA: Diagnosis not present

## 2018-07-15 DIAGNOSIS — N6320 Unspecified lump in the left breast, unspecified quadrant: Secondary | ICD-10-CM

## 2018-07-15 HISTORY — DX: Type 2 diabetes mellitus without complications: Z79.4

## 2018-07-15 HISTORY — DX: Polyneuropathy, unspecified: G62.9

## 2018-07-15 HISTORY — DX: Type 2 diabetes mellitus without complications: E11.9

## 2018-07-15 HISTORY — DX: Mixed hyperlipidemia: E78.2

## 2018-07-15 HISTORY — DX: Unspecified osteoarthritis, unspecified site: M19.90

## 2018-07-15 LAB — CBC WITH DIFFERENTIAL/PLATELET
Abs Immature Granulocytes: 0.02 10*3/uL (ref 0.00–0.07)
Basophils Absolute: 0 10*3/uL (ref 0.0–0.1)
Basophils Relative: 1 %
Eosinophils Absolute: 0.2 10*3/uL (ref 0.0–0.5)
Eosinophils Relative: 3 %
HCT: 39.2 % (ref 36.0–46.0)
Hemoglobin: 12.4 g/dL (ref 12.0–15.0)
Immature Granulocytes: 0 %
LYMPHS PCT: 29 %
Lymphs Abs: 1.8 10*3/uL (ref 0.7–4.0)
MCH: 26.4 pg (ref 26.0–34.0)
MCHC: 31.6 g/dL (ref 30.0–36.0)
MCV: 83.4 fL (ref 80.0–100.0)
Monocytes Absolute: 0.5 10*3/uL (ref 0.1–1.0)
Monocytes Relative: 7 %
Neutro Abs: 3.7 10*3/uL (ref 1.7–7.7)
Neutrophils Relative %: 60 %
Platelets: 255 10*3/uL (ref 150–400)
RBC: 4.7 MIL/uL (ref 3.87–5.11)
RDW: 13.1 % (ref 11.5–15.5)
WBC: 6.2 10*3/uL (ref 4.0–10.5)
nRBC: 0 % (ref 0.0–0.2)

## 2018-07-15 LAB — ABO/RH: ABO/RH(D): B POS

## 2018-07-15 LAB — COMPREHENSIVE METABOLIC PANEL
ALK PHOS: 76 U/L (ref 38–126)
ALT: 19 U/L (ref 0–44)
AST: 19 U/L (ref 15–41)
Albumin: 4 g/dL (ref 3.5–5.0)
Anion gap: 11 (ref 5–15)
BUN: 12 mg/dL (ref 8–23)
CALCIUM: 9.5 mg/dL (ref 8.9–10.3)
CO2: 23 mmol/L (ref 22–32)
Chloride: 104 mmol/L (ref 98–111)
Creatinine, Ser: 0.88 mg/dL (ref 0.44–1.00)
GFR calc Af Amer: >60 mL/min (ref >60)
GFR calc non Af Amer: >60 mL/min (ref >60)
Glucose, Bld: 260 mg/dL — ABNORMAL HIGH (ref 70–99)
Potassium: 4.1 mmol/L (ref 3.5–5.1)
Sodium: 138 mmol/L (ref 135–145)
Total Bilirubin: 0.5 mg/dL (ref 0.3–1.2)
Total Protein: 7.1 g/dL (ref 6.5–8.1)

## 2018-07-15 LAB — APTT: aPTT: 30 seconds (ref 24–36)

## 2018-07-15 LAB — HEMOGLOBIN A1C
Hgb A1c MFr Bld: 8.5 % — ABNORMAL HIGH (ref 4.8–5.6)
Mean Plasma Glucose: 197.25 mg/dL

## 2018-07-15 LAB — PROTIME-INR
INR: 0.88
Prothrombin Time: 11.9 seconds (ref 11.4–15.2)

## 2018-07-15 LAB — SURGICAL PCR SCREEN
MRSA, PCR: NEGATIVE
Staphylococcus aureus: NEGATIVE

## 2018-07-15 LAB — GLUCOSE, CAPILLARY: Glucose-Capillary: 245 mg/dL — ABNORMAL HIGH (ref 70–99)

## 2018-07-15 NOTE — Progress Notes (Signed)
Routed A1c result dated 07-15-2018 to dr graves in epic. Final EKG dated 07-15-2018 in epic. Final CXR dated 07-15-2018 in epic.  Called and spoke w/ pt via phone. Asked pt to come by on Monday 07-18-2018 to give urine sample, pt stated that she could come by.

## 2018-07-18 ENCOUNTER — Other Ambulatory Visit (HOSPITAL_COMMUNITY): Payer: Self-pay | Admitting: *Deleted

## 2018-07-18 ENCOUNTER — Ambulatory Visit
Admission: RE | Admit: 2018-07-18 | Discharge: 2018-07-18 | Disposition: A | Discharge status: Home / Self Care | Payer: Federal, State, Local not specified - PPO | Source: Ambulatory Visit | Attending: Family Medicine | Admitting: Family Medicine

## 2018-07-18 ENCOUNTER — Other Ambulatory Visit (HOSPITAL_COMMUNITY)
Admission: RE | Admit: 2018-07-18 | Payer: Federal, State, Local not specified - PPO | Source: Ambulatory Visit | Admitting: Orthopedic Surgery

## 2018-07-18 ENCOUNTER — Encounter (HOSPITAL_COMMUNITY)
Admission: RE | Admit: 2018-07-18 | Payer: Federal, State, Local not specified - PPO | Source: Ambulatory Visit | Admitting: Orthopedic Surgery

## 2018-07-18 DIAGNOSIS — N6320 Unspecified lump in the left breast, unspecified quadrant: Secondary | ICD-10-CM

## 2018-07-18 LAB — URINALYSIS, ROUTINE W REFLEX MICROSCOPIC
BACTERIA UA: NONE SEEN
Bilirubin Urine: NEGATIVE
Glucose, UA: >=500 mg/dL — AB
Hgb urine dipstick: NEGATIVE
Ketones, ur: NEGATIVE mg/dL
Leukocytes, UA: NEGATIVE
Nitrite: NEGATIVE
Protein, ur: NEGATIVE mg/dL
Specific Gravity, Urine: 1.011 (ref 1.005–1.030)
pH: 7 (ref 5.0–8.0)

## 2018-07-21 MED ORDER — BUPIVACAINE LIPOSOME 1.3 % IJ SUSP
20.0000 mL | INTRAMUSCULAR | Status: DC
  Filled 2018-07-21: qty 20

## 2018-07-22 ENCOUNTER — Telehealth: Payer: Self-pay | Admitting: Hematology and Oncology

## 2018-07-22 ENCOUNTER — Ambulatory Visit (HOSPITAL_COMMUNITY)
Admission: RE | Admit: 2018-07-22 | Payer: Federal, State, Local not specified - PPO | Source: Ambulatory Visit | Admitting: Orthopedic Surgery

## 2018-07-22 ENCOUNTER — Encounter (HOSPITAL_COMMUNITY): Admission: RE | Payer: Self-pay | Source: Ambulatory Visit

## 2018-07-22 LAB — TYPE AND SCREEN
ABO/RH(D): B POS
Antibody Screen: NEGATIVE

## 2018-07-22 SURGERY — ARTHROPLASTY, KNEE, TOTAL
Anesthesia: Spinal | Laterality: Right

## 2018-07-22 NOTE — Telephone Encounter (Signed)
New referral received from Dr. Dalbert Batman for invasive ductal carcinoma of lft breast. Spoke to the pt's daughter and scheduled to see Dr. Lindi Adie on 12/18 at 330pm. Aware to arrive 30 minutes early. I provided the address and phone number for myself and the breast navigators for questions.

## 2018-07-26 NOTE — Progress Notes (Signed)
[facility] CONSULT NOTE  Patient Care Team: Tia Alert, [place] as PCP - General (Neurology)  CHIEF COMPLAINTS/PURPOSE OF CONSULTATION:  Newly diagnosed breast cancer  HISTORY OF PRESENTING ILLNESS:  Gloria Mckinney 72 y.o. female is here because of recent diagnosis of invasive ductal carcinoma of the left breast. She was referred by Dr. Dalbert Batman. The cancer was detected on a screening mammogram on 07/04/18 and was palpable. A diagnostic mammogram and ultrasound on 07/15/18 showed a 1.8cm mass in the 10 o'clock position in the left breast. A biopsy on 07/18/18 showed the tumor to be grade 3, HER2 positive, ER positive 80%, PR positive 70%, Ki67 40%.   She presents to the clinic today with her husband and two daughters, one is a lab tech at [employer]. She was scheduled to get a knee replacement on 07/18/18 but the surgery was canceled because her HgA1C was 8.5. Her daughters asked many questions about surgery and chance of ovarian cancer and translated for the patient. She has a past medical history of hysterectomy. She reviewed her medication list with me.  I reviewed her records extensively and collaborated the history with the patient.  REVIEW OF SYSTEMS:   Constitutional: Denies fevers, chills or abnormal night sweats Eyes: Denies blurriness of vision, double vision or watery eyes Ears, nose, mouth, throat, and face: Denies mucositis or sore throat Respiratory: Denies cough, dyspnea or wheezes Cardiovascular: Denies palpitation, chest discomfort or lower extremity swelling Gastrointestinal:  Denies nausea, heartburn or change in bowel habits Skin: Denies abnormal skin rashes Lymphatics: Denies new lymphadenopathy or easy bruising Neurological:Denies numbness, tingling or new weaknesses Behavioral/Psych: Mood is stable, no new changes  Breast: Denies discharge All other systems were reviewed with the patient and are negative.  SUMMARY OF ONCOLOGIC  HISTORY:   Malignant neoplasm of upper-inner quadrant of left breast in female, estrogen receptor positive ([place])   07/18/2018 Initial Diagnosis    Screening detected left breast mass at 10 o'clock position 1.8 cm axilla negative, biopsy revealed grade 3 IDC ER 80%, PR 70%, Ki-67 40%, HER-2 +3+ by IHC with heterogeneity, T1c N0 stage Ia clinical stage      MEDICAL HISTORY:  Past Medical History:  Diagnosis Date  . GERD (gastroesophageal reflux disease)   . Hypertension   . Mixed hyperlipidemia   . OA (osteoarthritis)    knee right  . Peripheral neuropathy   . PVD (peripheral vascular disease) ([place])   . Type 2 diabetes mellitus treated with insulin [facility])    endocrinologist--- dr Providence Crosby Mcpeters    SURGICAL HISTORY: Past Surgical History:  Procedure Laterality Date  . BREAST BIOPSY Bilateral 2000   benign  . SHOULDER ARTHROSCOPY Right 06-16-2002   dr graves  . SHOULDER OPEN ROTATOR CUFF REPAIR Right 07-28-2000    dr Berenice Primas  . VAGINAL HYSTERECTOMY  1992   with BSO  . WRIST SURGERY  2007    SOCIAL HISTORY: Social History   Socioeconomic History  . Marital status: Married    Spouse name: Not on file  . Number of children: 4  . Years of education: Not on file  . Highest education level: Not on file  Occupational History  . Not on file  Social Needs  . Financial resource strain: Not on file  . Food insecurity:    Worry: Not on file    Inability: Not on file  . Transportation needs:    Medical: Not on file    Non-medical: Not on file  Tobacco Use  . Smoking status: Never Smoker  . Smokeless tobacco: Never Used  Substance and Sexual Activity  . Alcohol use: Never    Frequency: Never  . Drug use: Never  . Sexual activity: Not on file  Lifestyle  . Physical activity:    Days per week: Not on file    Minutes per session: Not on file  . Stress: Not on file  Relationships  . Social connections:    Talks on phone: Not on file    Gets together: Not on file    Attends  religious service: Not on file    Active member of club or organization: Not on file    Attends meetings of clubs or organizations: Not on file    Relationship status: Not on file  . Intimate partner violence:    Fear of current or ex partner: Not on file    Emotionally abused: Not on file    Physically abused: Not on file    Forced sexual activity: Not on file  Other Topics Concern  . Not on file  Social History Narrative   Lives with husband and son.      FAMILY HISTORY: Family History  Problem Relation Age of Onset  . Hypertension Mother   . CVA Mother   . Heart disease Mother   . Asthma Father   . Hypertension Sister   . Diabetes Sister   . Heart disease Brother   . Hypertension Sister   . Diabetes Sister   . Hypertension Sister   . Diabetes Sister   . Breast cancer Neg Hx     ALLERGIES:  is allergic to penicillins; cephalexin; and statins.  MEDICATIONS:  Current Outpatient Medications  Medication Sig Dispense Refill  . aspirin EC 81 MG tablet Take 81 mg by mouth daily.    . Calcium Carb-Cholecalciferol (CALCIUM + VITAMIN D3 PO) Take 1 tablet by mouth daily.    . Empagliflozin-metFORMIN HCl (SYNJARDY) 12.12-998 MG TABS Take 1 tablet by mouth 2 (two) times daily.    Marland Kitchen EXFORGE 5-320 MG tablet Take 1 tablet by mouth daily.    . ferrous sulfate 325 (65 FE) MG tablet Take 325 mg by mouth daily with breakfast.    . hydrochlorothiazide (HYDRODIURIL) 25 MG tablet Take 25 mg by mouth daily.    . Insulin Glargine, 1 Unit Dial, (TOUJEO SOLOSTAR) 300 UNIT/ML SOPN Inject 50 Units into the skin every evening.    Marland Kitchen omeprazole (PRILOSEC) 40 MG capsule Take 40 mg by mouth every morning.     . simvastatin (ZOCOR) 40 MG tablet Take 40 mg by mouth daily.     No current facility-administered medications for this visit.     PHYSICAL EXAMINATION: ECOG PERFORMANCE STATUS: 1 - Symptomatic but completely ambulatory  Vitals:   07/27/18 1521  BP: 136/72  Pulse: 91  Resp: 18  Temp:  97.8 F (36.6 C)  SpO2: 100%   Filed Weights   07/27/18 1521  Weight: 167 lb 9.6 oz (76 kg)    GENERAL:alert, no distress and comfortable SKIN: skin color, texture, turgor are normal, no rashes or significant lesions EYES: normal, conjunctiva are pink and non-injected, sclera clear OROPHARYNX:no exudate, no erythema and lips, buccal mucosa, and tongue normal  NECK: supple, thyroid normal size, non-tender, without nodularity LYMPH:  no palpable lymphadenopathy in the cervical, axillary or inguinal LUNGS: clear to auscultation and percussion with normal breathing effort HEART: regular rate & rhythm and no murmurs and no lower extremity  edema ABDOMEN:abdomen soft, non-tender and normal bowel sounds Musculoskeletal:no cyanosis of digits and no clubbing  PSYCH: alert & oriented x 3 with fluent speech NEURO: no focal motor/sensory deficits  LABORATORY DATA:  I have reviewed the data as listed Lab Results  Component Value Date   WBC 6.2 07/15/2018   HGB 12.4 07/15/2018   HCT 39.2 07/15/2018   MCV 83.4 07/15/2018   PLT 255 07/15/2018   Lab Results  Component Value Date   NA 138 07/15/2018   K 4.1 07/15/2018   CL 104 07/15/2018   CO2 23 07/15/2018    RADIOGRAPHIC STUDIES: I have personally reviewed the radiological reports and agreed with the findings in the report.  ASSESSMENT AND PLAN:  Malignant neoplasm of upper-inner quadrant of left breast in female, estrogen receptor positive ([place]) 07/18/2018: Screening detected left breast mass at 10 o'clock position 1.8 cm axilla negative, biopsy revealed grade 3 IDC ER 80%, PR 70%, Ki-67 40%, HER-2 +3+ by IHC with heterogeneity, T1c N0 stage Ia clinical stage  Pathology and radiology counseling: Discussed with the patient, the details of pathology including the type of breast cancer,the clinical staging, the significance of ER, PR and HER-2/neu receptors and the implications for treatment. After reviewing the pathology in detail, we  proceeded to discuss the different treatment options between surgery, radiation, chemotherapy, antiestrogen therapies.  Recommendation: 1.  Breast conserving surgery with sentinel lymph node biopsy 2.  Adjuvant Taxol Herceptin weekly x12 followed by Herceptin maintenance for 1 year 3.  Adjuvant radiation therapy 4.  Followed by adjuvant antiestrogen therapy  Chemo counseling: I discussed with the patient the risks and benefits of chemotherapy with Taxol including hair loss neuropathy risk and cytopenias and fatigue and nausea.  Risks of Herceptin include cardiac dysfunction with a drop in ejection fraction which can be reversible.  Patient will undergo port placement and echocardiogram and chemo class after surgery. Return to clinic after surgery to discuss the final pathology report and then set up time for chemo.   All questions were answered. The patient knows to call the clinic with any problems, questions or concerns.   Nicholas Lose, MD 07/27/2018   I, Cloyde Reams Dorshimer, am acting as scribe for Nicholas Lose, MD.  I have reviewed the above documentation for accuracy and completeness, and I agree with the above.

## 2018-07-27 ENCOUNTER — Inpatient Hospital Stay
Payer: Federal, State, Local not specified - PPO | Attending: Hematology and Oncology | Admitting: Hematology and Oncology

## 2018-07-27 ENCOUNTER — Encounter: Payer: Self-pay | Admitting: *Deleted

## 2018-07-27 DIAGNOSIS — G629 Polyneuropathy, unspecified: Secondary | ICD-10-CM | POA: Insufficient documentation

## 2018-07-27 DIAGNOSIS — Z170 Estrogen receptor positive status [ER+]: Secondary | ICD-10-CM

## 2018-07-27 DIAGNOSIS — Z79899 Other long term (current) drug therapy: Secondary | ICD-10-CM | POA: Insufficient documentation

## 2018-07-27 DIAGNOSIS — Z90710 Acquired absence of both cervix and uterus: Secondary | ICD-10-CM | POA: Insufficient documentation

## 2018-07-27 DIAGNOSIS — M1990 Unspecified osteoarthritis, unspecified site: Secondary | ICD-10-CM | POA: Diagnosis not present

## 2018-07-27 DIAGNOSIS — Z794 Long term (current) use of insulin: Secondary | ICD-10-CM | POA: Diagnosis not present

## 2018-07-27 DIAGNOSIS — K219 Gastro-esophageal reflux disease without esophagitis: Secondary | ICD-10-CM | POA: Insufficient documentation

## 2018-07-27 DIAGNOSIS — I10 Essential (primary) hypertension: Secondary | ICD-10-CM

## 2018-07-27 DIAGNOSIS — C50212 Malignant neoplasm of upper-inner quadrant of left female breast: Secondary | ICD-10-CM | POA: Insufficient documentation

## 2018-07-27 DIAGNOSIS — Z7982 Long term (current) use of aspirin: Secondary | ICD-10-CM | POA: Diagnosis not present

## 2018-07-27 DIAGNOSIS — I739 Peripheral vascular disease, unspecified: Secondary | ICD-10-CM | POA: Insufficient documentation

## 2018-07-27 DIAGNOSIS — E119 Type 2 diabetes mellitus without complications: Secondary | ICD-10-CM | POA: Insufficient documentation

## 2018-07-27 DIAGNOSIS — E785 Hyperlipidemia, unspecified: Secondary | ICD-10-CM | POA: Diagnosis not present

## 2018-07-27 NOTE — Assessment & Plan Note (Signed)
07/18/2018: Screening detected left breast mass at 10 o'clock position 1.8 cm axilla negative, biopsy revealed grade 3 IDC ER 80%, PR 70%, Ki-67 40%, HER-2 +3+ by IHC with heterogeneity, T1c N0 stage Ia clinical stage  Pathology and radiology counseling: Discussed with the patient, the details of pathology including the type of breast cancer,the clinical staging, the significance of ER, PR and HER-2/neu receptors and the implications for treatment. After reviewing the pathology in detail, we proceeded to discuss the different treatment options between surgery, radiation, chemotherapy, antiestrogen therapies.  Recommendation: 1.  Breast conserving surgery with sentinel lymph node biopsy 2.  Adjuvant Taxol Herceptin weekly x12 followed by Herceptin maintenance for 1 year 3.  Adjuvant radiation therapy 4.  Followed by adjuvant antiestrogen therapy  Chemo counseling: I discussed with the patient the risks and benefits of chemotherapy with Taxol including hair loss neuropathy risk and cytopenias and fatigue and nausea.  Risks of Herceptin include cardiac dysfunction with a drop in ejection fraction which can be reversible.  Patient will undergo port placement and echocardiogram and chemo class after surgery. Return to clinic after surgery to discuss the final pathology report and then set up time for chemo.

## 2018-07-28 ENCOUNTER — Telehealth: Payer: Self-pay | Admitting: Hematology and Oncology

## 2018-07-28 NOTE — Telephone Encounter (Signed)
Per 12/18 no los °

## 2018-07-29 ENCOUNTER — Other Ambulatory Visit: Payer: Self-pay | Admitting: General Surgery

## 2018-07-29 DIAGNOSIS — C50212 Malignant neoplasm of upper-inner quadrant of left female breast: Secondary | ICD-10-CM

## 2018-07-29 DIAGNOSIS — Z170 Estrogen receptor positive status [ER+]: Principal | ICD-10-CM

## 2018-08-01 ENCOUNTER — Encounter (HOSPITAL_BASED_OUTPATIENT_CLINIC_OR_DEPARTMENT_OTHER): Payer: Self-pay | Admitting: *Deleted

## 2018-08-01 ENCOUNTER — Other Ambulatory Visit: Payer: Self-pay | Admitting: General Surgery

## 2018-08-01 ENCOUNTER — Telehealth: Payer: Self-pay | Admitting: Hematology and Oncology

## 2018-08-01 ENCOUNTER — Other Ambulatory Visit: Payer: Self-pay

## 2018-08-01 ENCOUNTER — Other Ambulatory Visit: Payer: Self-pay | Admitting: *Deleted

## 2018-08-01 DIAGNOSIS — C50212 Malignant neoplasm of upper-inner quadrant of left female breast: Secondary | ICD-10-CM

## 2018-08-01 DIAGNOSIS — Z170 Estrogen receptor positive status [ER+]: Principal | ICD-10-CM

## 2018-08-01 NOTE — Telephone Encounter (Signed)
Scheduled appt per 12/23 sch message - pt is aware of appt date and time   

## 2018-08-09 ENCOUNTER — Ambulatory Visit: Payer: Federal, State, Local not specified - PPO | Admitting: Hematology and Oncology

## 2018-08-11 ENCOUNTER — Ambulatory Visit
Admission: RE | Admit: 2018-08-11 | Discharge: 2018-08-11 | Disposition: A | Discharge status: Home / Self Care | Payer: Federal, State, Local not specified - PPO | Source: Ambulatory Visit | Attending: General Surgery | Admitting: General Surgery

## 2018-08-11 DIAGNOSIS — Z170 Estrogen receptor positive status [ER+]: Principal | ICD-10-CM

## 2018-08-11 DIAGNOSIS — C50212 Malignant neoplasm of upper-inner quadrant of left female breast: Secondary | ICD-10-CM

## 2018-08-11 NOTE — Progress Notes (Signed)
Ensure pre surgery drink given with instructions to complete by [place], surgical soap given with instructions, pt verbalized understanding.

## 2018-08-14 NOTE — H&P (Signed)
Gloria Mckinney Location: [facility] Patient #: [id number] DOB: 07-13-1946 Undefined / Language: [place] C[place] / Race: Undefined Female      History of Present Illness      . This is a very pleasant 73 year old female from [place]. She is here with her husband and 2 daughters. She was referred by Dr. Lovey Newcomer at the [facility] for her to invasive cancer left breast upper inner quadrant. She has requested that she see Dr. Lindi Adie and we are going to arrange that     The only prior breast problems was in 2017. She apparently had a core biopsy of the left breast lower outer quadrant which was negative family history negative She gets annual screening and was recalled. They found a 1.8 cm mass in the left breast, 10 o'clock position, 5 cm from the nipple. Left axillary ultrasound okay. Core biopsy was performed. Clip placement is good. Pathology shows invasive ductal carcinoma, grade 3. ER 80%. PR 70%. Ki-67 40%. HER-2/neu strongly positive      She was scheduled for right total knee replacement tomorrow by Dr. Berenice Primas. Cancelled.      Comorbidities include diabetes. Followed by a Dr. Posey Pronto endocrinology. CK D1. Chronic anemia. TAH. Rotator cuff surgery. Hypertension. GERD. Degenerative joint disease. Hyperlipidemia No cardiac disease. No pulmonary disease. CKD 1. Family history negative for breast ovarian or prostate cancer Father died had asthma Mother deceased had stroke Brother deceased of hypertension and renal failure Social history reveals she is married. Children. Husband with her. Never smoked. Denies alcohol. walks every day.      We had a very long discussion almost one hour. We talked about surgical options including lumpectomy sentinel node biopsy and radiation therapy. We talked about mastectomy and sentinel node biopsy. We talked about systemic chemotherapy and the high likelihood that she would be offered that. We talked about Port-A-Cath  insertion.  she needs to see Dr. Lindi Adie for a preop conference. Once the breast hematoma settles down she can probably have lumpectomy and sentinel node biopsy now.  She will be placed on breast conference.   Addendum Note 07/29/2018 9:17 AM) Dr. Lindi Adie has evaluated the patient and states that we may go ahead with definitive surgery and Port-A-Cath insertion We will plan left breast lumpectomy with RSL, left deep axillary SL and biopsy, insert Port-A-Cath with ultrasound Will contact patient and go ahead with scheduling    Past Surgical History  Breast Biopsy  Left. Hysterectomy (not due to cancer) - Complete  Shoulder Surgery  Bilateral.  Diagnostic Studies History  Colonoscopy  5-10 years ago Mammogram  within last year Pap Smear  never  Allergies  Penicillins  Swollen lips. Allergies Reconciled   Medication History [employer], RMA; 07/21/2018 11:59 AM) Exforge (5-320MG Tablet, Oral) Active. Iron (325 (65 Fe)MG Tablet, Oral) Active. Jardiance (25MG Tablet, Oral) Active. Toujeo SoloStar (300UNIT/ML Soln Pen-inj, Subcutaneous) Active. metFORMIN HCl (1000MG Tablet, Oral) Active. Medications Reconciled  Social History  Caffeine use  Tea. No alcohol use  No drug use  Tobacco use  Never smoker.  Family History  Cerebrovascular Accident  Brother, Mother. Diabetes Mellitus  Daughter, Sister. Hypertension  Mother. Kidney Disease  Brother. Migraine Headache  Daughter. Respiratory Condition  Father.  Pregnancy / Birth History Age at menarche  37 years. Age of menopause  26-50 Gravida  6 Irregular periods  Length (months) of breastfeeding  3-6 Maternal age  51-25 Para  44  Other Problems Arthritis  Breast Cancer  Diabetes Mellitus  Gastroesophageal  Reflux Disease  High blood pressure  Hypercholesterolemia  Lump In Breast     Review of Systems  General Not Present- Appetite Loss, Chills, Fatigue, Fever, Night  Sweats, Weight Gain and Weight Loss. HEENT Present- Ringing in the Ears. Not Present- Earache, Hearing Loss, Hoarseness, Nose Bleed, Oral Ulcers, Seasonal Allergies, Sinus Pain, Sore Throat, Visual Disturbances, Wears glasses/contact lenses and Yellow Eyes. Respiratory Not Present- Bloody sputum, Chronic Cough, Difficulty Breathing, Snoring and Wheezing. Breast Not Present- Breast Mass, Breast Pain, Nipple Discharge and Skin Changes. Gastrointestinal Present- Constipation, Excessive gas and Indigestion. Not Present- Abdominal Pain, Bloating, Bloody Stool, Change in Bowel Habits, Chronic diarrhea, Difficulty Swallowing, Gets full quickly at meals, Hemorrhoids, Nausea, Rectal Pain and Vomiting. Female Genitourinary Present- Nocturia. Not Present- Frequency, Painful Urination, Pelvic Pain and Urgency. Musculoskeletal Present- Joint Pain and Swelling of Extremities. Not Present- Back Pain, Joint Stiffness, Muscle Pain and Muscle Weakness. Neurological Not Present- Decreased Memory, Fainting, Headaches, Numbness, Seizures, Tingling, Tremor, Trouble walking and Weakness. Psychiatric Not Present- Anxiety, Bipolar, Change in Sleep Pattern, Depression, Fearful and Frequent crying. Endocrine Present- Hair Changes. Not Present- Cold Intolerance, Excessive Hunger, Heat Intolerance, Hot flashes and New Diabetes. Hematology Not Present- Blood Thinners, Easy Bruising, Excessive bleeding, Gland problems, HIV and Persistent Infections.  Vitals  Weight: 167 lb Height: 62in Body Surface Area: 1.77 m Body Mass Index: 30.54 kg/m  Temp.: 96.57F(Temporal)  Pulse: 99 (Regular)  P.OX: 99% (Room air) BP: 142/88 (Sitting, Left Arm, Standard)       Physical Exam  General Mental Status-Alert. General Appearance-Consistent with stated age. Hydration-Well hydrated. Voice-Normal.  Head and Neck Head-normocephalic, atraumatic with no lesions or palpable  masses. Trachea-midline. Thyroid Gland Characteristics - normal size and consistency.  Eye Eyeball - Bilateral-Extraocular movements intact. Sclera/Conjunctiva - Bilateral-No scleral icterus.  Chest and Lung Exam Chest and lung exam reveals -quiet, even and easy respiratory effort with no use of accessory muscles and on auscultation, normal breath sounds, no adventitious sounds and normal vocal resonance. Inspection Chest Wall - Normal. Back - normal.  Breast Note: Left breast reveals a 3 cm hematoma upper inner quadrant. No other masses in either breast. No skin changes. No axillary adenopathy.   Cardiovascular Cardiovascular examination reveals -normal heart sounds, regular rate and rhythm with no murmurs and normal pedal pulses bilaterally.  Abdomen Inspection Inspection of the abdomen reveals - No Hernias. Skin - Scar - no surgical scars. Palpation/Percussion Palpation and Percussion of the abdomen reveal - Soft, Non Tender, No Rebound tenderness, No Rigidity (guarding) and No hepatosplenomegaly. Auscultation Auscultation of the abdomen reveals - Bowel sounds normal.  Neurologic Neurologic evaluation reveals -alert and oriented x 3 with no impairment of recent or remote memory. Mental Status-Normal.  Musculoskeletal Normal Exam - Left-Upper Extremity Strength Normal and Lower Extremity Strength Normal. Normal Exam - Right-Upper Extremity Strength Normal and Lower Extremity Strength Normal.  Lymphatic Head & Neck  General Head & Neck Lymphatics: Bilateral - Description - Normal. Axillary  General Axillary Region: Bilateral - Description - Normal. Tenderness - Non Tender. Femoral & Inguinal  Generalized Femoral & Inguinal Lymphatics: Bilateral - Description - Normal. Tenderness - Non Tender.    Assessment & Plan PRIMARY CANCER OF UPPER INNER QUADRANT OF LEFT FEMALE BREAST (C50.212)   You have been found to have a cancer in the left breast,  upper inner quadrant By x-ray this is 1.8 cm in diameter By x-ray and by exam the lymph nodes are negative  Biopsy shows invasive ductal carcinoma, high-grade, estrogen receptor  positive, HER-2 positive  We had a long talk about surgical options including lumpectomy, sentinel node biopsy and postop radiation therapy. We also discussed mastectomy. The tumor appears solitary and you most likely will be a candidate for lumpectomy if you choose The next step in your care is a medical oncology consult with Dr. Nicholas Lose and we are making those arrangements now  Because the tumor is HER-2 positive, it is likely that he will recommend chemotherapy and Port-A-Cath insertion You decided to postpone the knee surgery and go ahead with definitive breast cancer surgery  You are scheduled for port a cath insertion, left breast lumpectomy with RSL, and left axillary sentinal lymph node biopsy      DEGENERATIVE JOINT DISEASE OF KNEE (M17.10) Impression: Scheduled for total knee replacement tomorrow. Dr. 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 Dalbert Batman, M.D., [facility] General and Minimally invasive Surgery Breast and Colorectal Surgery Office:   [phone] Pager:   [phone]

## 2018-08-15 ENCOUNTER — Ambulatory Visit (HOSPITAL_BASED_OUTPATIENT_CLINIC_OR_DEPARTMENT_OTHER): Payer: Federal, State, Local not specified - PPO | Admitting: Anesthesiology

## 2018-08-15 ENCOUNTER — Encounter (HOSPITAL_BASED_OUTPATIENT_CLINIC_OR_DEPARTMENT_OTHER): Payer: Self-pay | Admitting: Emergency Medicine

## 2018-08-15 ENCOUNTER — Ambulatory Visit (HOSPITAL_BASED_OUTPATIENT_CLINIC_OR_DEPARTMENT_OTHER)
Admission: RE | Admit: 2018-08-15 | Discharge: 2018-08-15 | Disposition: A | Discharge status: Home / Self Care | Payer: Federal, State, Local not specified - PPO | Attending: General Surgery | Admitting: General Surgery

## 2018-08-15 ENCOUNTER — Ambulatory Visit
Admission: RE | Admit: 2018-08-15 | Discharge: 2018-08-15 | Disposition: A | Discharge status: Home / Self Care | Payer: Federal, State, Local not specified - PPO | Source: Ambulatory Visit | Attending: General Surgery | Admitting: General Surgery

## 2018-08-15 ENCOUNTER — Ambulatory Visit (HOSPITAL_COMMUNITY): Payer: Federal, State, Local not specified - PPO

## 2018-08-15 ENCOUNTER — Encounter (HOSPITAL_COMMUNITY)
Admission: RE | Admit: 2018-08-15 | Discharge: 2018-08-15 | Disposition: A | Discharge status: Home / Self Care | Payer: Federal, State, Local not specified - PPO | Source: Ambulatory Visit | Attending: General Surgery | Admitting: General Surgery

## 2018-08-15 ENCOUNTER — Other Ambulatory Visit: Payer: Self-pay

## 2018-08-15 ENCOUNTER — Encounter (HOSPITAL_BASED_OUTPATIENT_CLINIC_OR_DEPARTMENT_OTHER)
Admission: RE | Disposition: A | Discharge status: Home / Self Care | Payer: Self-pay | Source: Home / Self Care | Attending: General Surgery

## 2018-08-15 DIAGNOSIS — Z79899 Other long term (current) drug therapy: Secondary | ICD-10-CM | POA: Insufficient documentation

## 2018-08-15 DIAGNOSIS — C50212 Malignant neoplasm of upper-inner quadrant of left female breast: Secondary | ICD-10-CM

## 2018-08-15 DIAGNOSIS — I129 Hypertensive chronic kidney disease with stage 1 through stage 4 chronic kidney disease, or unspecified chronic kidney disease: Secondary | ICD-10-CM | POA: Insufficient documentation

## 2018-08-15 DIAGNOSIS — Z95828 Presence of other vascular implants and grafts: Secondary | ICD-10-CM

## 2018-08-15 DIAGNOSIS — Z170 Estrogen receptor positive status [ER+]: Principal | ICD-10-CM

## 2018-08-15 DIAGNOSIS — Z880 Allergy status to penicillin: Secondary | ICD-10-CM | POA: Diagnosis not present

## 2018-08-15 DIAGNOSIS — Z794 Long term (current) use of insulin: Secondary | ICD-10-CM | POA: Insufficient documentation

## 2018-08-15 DIAGNOSIS — E1122 Type 2 diabetes mellitus with diabetic chronic kidney disease: Secondary | ICD-10-CM | POA: Insufficient documentation

## 2018-08-15 DIAGNOSIS — N181 Chronic kidney disease, stage 1: Secondary | ICD-10-CM | POA: Diagnosis not present

## 2018-08-15 DIAGNOSIS — M1710 Unilateral primary osteoarthritis, unspecified knee: Secondary | ICD-10-CM | POA: Diagnosis not present

## 2018-08-15 HISTORY — PX: PORTACATH PLACEMENT: SHX2246

## 2018-08-15 HISTORY — PX: BREAST LUMPECTOMY WITH RADIOACTIVE SEED AND SENTINEL LYMPH NODE BIOPSY: SHX6550

## 2018-08-15 LAB — GLUCOSE, CAPILLARY
Glucose-Capillary: 250 mg/dL — ABNORMAL HIGH (ref 70–99)
Glucose-Capillary: 183 mg/dL — ABNORMAL HIGH (ref 70–99)

## 2018-08-15 SURGERY — BREAST LUMPECTOMY WITH RADIOACTIVE SEED AND SENTINEL LYMPH NODE BIOPSY
Anesthesia: General | Site: Chest | Laterality: Right

## 2018-08-15 MED ORDER — EPHEDRINE SULFATE-NACL 50-0.9 MG/10ML-% IV SOSY
PREFILLED_SYRINGE | INTRAVENOUS | Status: DC | PRN
  Administered 2018-08-15: 10 mg via INTRAVENOUS

## 2018-08-15 MED ORDER — SODIUM CHLORIDE 0.9% FLUSH: 3.0000 mL | INTRAVENOUS | Status: DC | PRN

## 2018-08-15 MED ORDER — DEXAMETHASONE SODIUM PHOSPHATE 10 MG/ML IJ SOLN
INTRAMUSCULAR | Status: AC
  Filled 2018-08-15: qty 1

## 2018-08-15 MED ORDER — ACETAMINOPHEN 325 MG PO TABS: 650.0000 mg | ORAL_TABLET | ORAL | Status: DC | PRN

## 2018-08-15 MED ORDER — LIDOCAINE 2% (20 MG/ML) 5 ML SYRINGE
INTRAMUSCULAR | Status: AC
  Filled 2018-08-15: qty 5

## 2018-08-15 MED ORDER — OXYCODONE HCL 5 MG PO TABS: 5.0000 mg | ORAL_TABLET | ORAL | Status: DC | PRN

## 2018-08-15 MED ORDER — ACETAMINOPHEN 650 MG RE SUPP: 650.0000 mg | RECTAL | Status: DC | PRN

## 2018-08-15 MED ORDER — SODIUM CHLORIDE 0.9% FLUSH: 3.0000 mL | Freq: Two times a day (BID) | INTRAVENOUS | Status: DC

## 2018-08-15 MED ORDER — MIDAZOLAM HCL 2 MG/2ML IJ SOLN
INTRAMUSCULAR | Status: AC
  Filled 2018-08-15: qty 2

## 2018-08-15 MED ORDER — CELECOXIB 100 MG PO CAPS
ORAL_CAPSULE | ORAL | Status: AC
  Filled 2018-08-15: qty 1

## 2018-08-15 MED ORDER — CHLORHEXIDINE GLUCONATE CLOTH 2 % EX PADS: 6.0000 | MEDICATED_PAD | Freq: Once | CUTANEOUS | Status: DC

## 2018-08-15 MED ORDER — BUPIVACAINE-EPINEPHRINE (PF) 0.25% -1:200000 IJ SOLN
INTRAMUSCULAR | Status: DC | PRN
  Administered 2018-08-15: 22 mL

## 2018-08-15 MED ORDER — GABAPENTIN 300 MG PO CAPS
ORAL_CAPSULE | ORAL | Status: AC
  Filled 2018-08-15: qty 1

## 2018-08-15 MED ORDER — FENTANYL CITRATE (PF) 100 MCG/2ML IJ SOLN
INTRAMUSCULAR | Status: AC
  Filled 2018-08-15: qty 2

## 2018-08-15 MED ORDER — ACETAMINOPHEN 500 MG PO TABS
1000.0000 mg | ORAL_TABLET | ORAL | Status: AC
  Administered 2018-08-15: 1000 mg via ORAL

## 2018-08-15 MED ORDER — VANCOMYCIN HCL IN DEXTROSE 1-5 GM/200ML-% IV SOLN
1000.0000 mg | INTRAVENOUS | Status: AC
  Administered 2018-08-15: 1000 mg via INTRAVENOUS

## 2018-08-15 MED ORDER — HEPARIN SOD (PORK) LOCK FLUSH 100 UNIT/ML IV SOLN
INTRAVENOUS | Status: DC | PRN
  Administered 2018-08-15: 500 [IU] via INTRAVENOUS

## 2018-08-15 MED ORDER — ACETAMINOPHEN 500 MG PO TABS
ORAL_TABLET | ORAL | Status: AC
  Filled 2018-08-15: qty 2

## 2018-08-15 MED ORDER — TECHNETIUM TC 99M SULFUR COLLOID FILTERED
1.0000 | Freq: Once | INTRAVENOUS | Status: AC | PRN
  Administered 2018-08-15: 1 via INTRADERMAL

## 2018-08-15 MED ORDER — HYDROCODONE-ACETAMINOPHEN 5-325 MG PO TABS: 1.0000 | ORAL_TABLET | Freq: Four times a day (QID) | ORAL | 0 refills | Status: DC | PRN

## 2018-08-15 MED ORDER — EPHEDRINE 5 MG/ML INJ
INTRAVENOUS | Status: AC
  Filled 2018-08-15: qty 10

## 2018-08-15 MED ORDER — SCOPOLAMINE 1 MG/3DAYS TD PT72: 1.0000 | MEDICATED_PATCH | Freq: Once | TRANSDERMAL | Status: DC | PRN

## 2018-08-15 MED ORDER — FENTANYL CITRATE (PF) 100 MCG/2ML IJ SOLN
50.0000 ug | INTRAMUSCULAR | Status: AC | PRN
  Administered 2018-08-15: 25 ug via INTRAVENOUS
  Administered 2018-08-15: 50 ug via INTRAVENOUS
  Administered 2018-08-15 (×2): 25 ug via INTRAVENOUS

## 2018-08-15 MED ORDER — ONDANSETRON HCL 4 MG/2ML IJ SOLN
INTRAMUSCULAR | Status: AC
  Filled 2018-08-15: qty 2

## 2018-08-15 MED ORDER — FENTANYL CITRATE (PF) 100 MCG/2ML IJ SOLN
25.0000 ug | INTRAMUSCULAR | Status: DC | PRN
  Administered 2018-08-15: 25 ug via INTRAVENOUS

## 2018-08-15 MED ORDER — PROPOFOL 10 MG/ML IV BOLUS
INTRAVENOUS | Status: AC
  Filled 2018-08-15: qty 20

## 2018-08-15 MED ORDER — LACTATED RINGERS IV SOLN: INTRAVENOUS | Status: DC

## 2018-08-15 MED ORDER — GABAPENTIN 300 MG PO CAPS
300.0000 mg | ORAL_CAPSULE | ORAL | Status: AC
  Administered 2018-08-15: 300 mg via ORAL

## 2018-08-15 MED ORDER — CELECOXIB 100 MG PO CAPS
100.0000 mg | ORAL_CAPSULE | ORAL | Status: AC
  Administered 2018-08-15: 100 mg via ORAL

## 2018-08-15 MED ORDER — ONDANSETRON HCL 4 MG/2ML IJ SOLN
INTRAMUSCULAR | Status: DC | PRN
  Administered 2018-08-15: 4 mg via INTRAVENOUS

## 2018-08-15 MED ORDER — SODIUM CHLORIDE (PF) 0.9 % IJ SOLN
INTRAVENOUS | Status: DC | PRN
  Administered 2018-08-15: 5 mL via INTRAMUSCULAR

## 2018-08-15 MED ORDER — HEPARIN (PORCINE) IN NACL 2-0.9 UNITS/ML
INTRAMUSCULAR | Status: AC | PRN
  Administered 2018-08-15: 1 via INTRAVENOUS

## 2018-08-15 MED ORDER — SODIUM CHLORIDE 0.9 % IV SOLN: 250.0000 mL | INTRAVENOUS | Status: DC | PRN

## 2018-08-15 MED ORDER — ONDANSETRON HCL 4 MG/2ML IJ SOLN: 4.0000 mg | Freq: Once | INTRAMUSCULAR | Status: DC | PRN

## 2018-08-15 MED ORDER — ACETAMINOPHEN 500 MG PO TABS: 1000.0000 mg | ORAL_TABLET | Freq: Four times a day (QID) | ORAL | Status: DC

## 2018-08-15 MED ORDER — LIDOCAINE HCL (CARDIAC) PF 100 MG/5ML IV SOSY
PREFILLED_SYRINGE | INTRAVENOUS | Status: DC | PRN
  Administered 2018-08-15: 80 mg via INTRAVENOUS

## 2018-08-15 MED ORDER — BUPIVACAINE-EPINEPHRINE (PF) 0.5% -1:200000 IJ SOLN
INTRAMUSCULAR | Status: DC | PRN
  Administered 2018-08-15: 30 mL via PERINEURAL

## 2018-08-15 MED ORDER — PROPOFOL 10 MG/ML IV BOLUS
INTRAVENOUS | Status: DC | PRN
  Administered 2018-08-15: 150 mg via INTRAVENOUS
  Administered 2018-08-15 (×2): 20 mg via INTRAVENOUS

## 2018-08-15 MED ORDER — VANCOMYCIN HCL IN DEXTROSE 1-5 GM/200ML-% IV SOLN
INTRAVENOUS | Status: AC
  Filled 2018-08-15: qty 200

## 2018-08-15 MED ORDER — LACTATED RINGERS IV SOLN
INTRAVENOUS | Status: DC
  Administered 2018-08-15 (×2): via INTRAVENOUS

## 2018-08-15 MED ORDER — MIDAZOLAM HCL 2 MG/2ML IJ SOLN
1.0000 mg | INTRAMUSCULAR | Status: DC | PRN
  Administered 2018-08-15: 1 mg via INTRAVENOUS

## 2018-08-15 MED ORDER — DEXAMETHASONE SODIUM PHOSPHATE 4 MG/ML IJ SOLN
INTRAMUSCULAR | Status: DC | PRN
  Administered 2018-08-15: 10 mg via INTRAVENOUS

## 2018-08-15 MED ORDER — FENTANYL CITRATE (PF) 100 MCG/2ML IJ SOLN: 25.0000 ug | INTRAMUSCULAR | Status: DC | PRN

## 2018-08-15 SURGICAL SUPPLY — 85 items
ADH SKN CLS APL DERMABOND .7 (GAUZE/BANDAGES/DRESSINGS) ×2
APL SKNCLS STERI-STRIP NONHPOA (GAUZE/BANDAGES/DRESSINGS)
APPLIER CLIP 11 MED OPEN (CLIP) ×4
APR CLP MED 11 20 MLT OPN (CLIP) ×2
BAG DECANTER FOR FLEXI CONT (MISCELLANEOUS) ×4 IMPLANT
BENZOIN TINCTURE PRP APPL 2/3 (GAUZE/BANDAGES/DRESSINGS) IMPLANT
BINDER BREAST LRG (GAUZE/BANDAGES/DRESSINGS) IMPLANT
BINDER BREAST MEDIUM (GAUZE/BANDAGES/DRESSINGS) IMPLANT
BINDER BREAST XLRG (GAUZE/BANDAGES/DRESSINGS) IMPLANT
BLADE HEX COATED 2.75 (ELECTRODE) ×4 IMPLANT
BLADE SURG 15 STRL LF DISP TIS (BLADE) ×4 IMPLANT
BLADE SURG 15 STRL SS (BLADE) ×8
CANISTER SUCT 1200ML W/VALVE (MISCELLANEOUS) ×4 IMPLANT
CHLORAPREP W/TINT 26ML (MISCELLANEOUS) ×6 IMPLANT
CLIP APPLIE 11 MED OPEN (CLIP) ×2 IMPLANT
CLOSURE WOUND 1/2 X4 (GAUZE/BANDAGES/DRESSINGS)
COVER BACK TABLE 60X90IN (DRAPES) ×4 IMPLANT
COVER MAYO STAND STRL (DRAPES) ×4 IMPLANT
COVER PROBE 5X48 (MISCELLANEOUS) ×3
COVER PROBE W GEL 5X96 (DRAPES) ×4 IMPLANT
COVER WAND RF STERILE (DRAPES) IMPLANT
DECANTER SPIKE VIAL GLASS SM (MISCELLANEOUS) IMPLANT
DERMABOND ADVANCED (GAUZE/BANDAGES/DRESSINGS) ×2
DERMABOND ADVANCED .7 DNX12 (GAUZE/BANDAGES/DRESSINGS) ×2 IMPLANT
DRAPE C-ARM 42X72 X-RAY (DRAPES) ×4 IMPLANT
DRAPE LAPAROSCOPIC ABDOMINAL (DRAPES) ×6 IMPLANT
DRAPE UTILITY XL STRL (DRAPES) ×6 IMPLANT
DRSG PAD ABDOMINAL 8X10 ST (GAUZE/BANDAGES/DRESSINGS) ×4 IMPLANT
DRSG TEGADERM 2-3/8X2-3/4 SM (GAUZE/BANDAGES/DRESSINGS) IMPLANT
DRSG TEGADERM 4X10 (GAUZE/BANDAGES/DRESSINGS) IMPLANT
DRSG TEGADERM 4X4.75 (GAUZE/BANDAGES/DRESSINGS) IMPLANT
ELECT REM PT RETURN 9FT ADLT (ELECTROSURGICAL) ×4
ELECTRODE REM PT RTRN 9FT ADLT (ELECTROSURGICAL) ×2 IMPLANT
GAUZE SPONGE 4X4 12PLY STRL (GAUZE/BANDAGES/DRESSINGS) ×4 IMPLANT
GAUZE SPONGE 4X4 12PLY STRL LF (GAUZE/BANDAGES/DRESSINGS) IMPLANT
GLOVE BIO SURGEON STRL SZ7 (GLOVE) ×4 IMPLANT
GLOVE BIOGEL PI IND STRL 7.0 (GLOVE) IMPLANT
GLOVE BIOGEL PI IND STRL 7.5 (GLOVE) IMPLANT
GLOVE BIOGEL PI INDICATOR 7.0 (GLOVE) ×2
GLOVE BIOGEL PI INDICATOR 7.5 (GLOVE) ×4
GLOVE EUDERMIC 7 POWDERFREE (GLOVE) ×6 IMPLANT
GOWN STRL REUS W/ TWL LRG LVL3 (GOWN DISPOSABLE) ×2 IMPLANT
GOWN STRL REUS W/ TWL XL LVL3 (GOWN DISPOSABLE) ×2 IMPLANT
GOWN STRL REUS W/TWL LRG LVL3 (GOWN DISPOSABLE) ×6
GOWN STRL REUS W/TWL XL LVL3 (GOWN DISPOSABLE) ×6
ILLUMINATOR WAVEGUIDE N/F (MISCELLANEOUS) IMPLANT
IV CATH PLACEMENT UNIT 16 GA (IV SOLUTION) IMPLANT
IV KIT MINILOC 20X1 SAFETY (NEEDLE) IMPLANT
KIT CVR 48X5XPRB PLUP LF (MISCELLANEOUS) ×2 IMPLANT
KIT MARKER MARGIN INK (KITS) ×4 IMPLANT
KIT PORT POWER 8FR ISP CVUE (Port) ×4 IMPLANT
LIGHT WAVEGUIDE WIDE FLAT (MISCELLANEOUS) IMPLANT
NDL BLUNT 17GA (NEEDLE) IMPLANT
NDL HYPO 25X1 1.5 SAFETY (NEEDLE) ×4 IMPLANT
NDL SAFETY ECLIPSE 18X1.5 (NEEDLE) ×2 IMPLANT
NEEDLE BLUNT 17GA (NEEDLE) IMPLANT
NEEDLE HYPO 18GX1.5 SHARP (NEEDLE) ×3
NEEDLE HYPO 22GX1.5 SAFETY (NEEDLE) ×4 IMPLANT
NEEDLE HYPO 25X1 1.5 SAFETY (NEEDLE) ×4 IMPLANT
NS IRRIG 1000ML POUR BTL (IV SOLUTION) ×4 IMPLANT
PACK BASIN DAY SURGERY FS (CUSTOM PROCEDURE TRAY) ×4 IMPLANT
PAD ALCOHOL SWAB (MISCELLANEOUS) ×4 IMPLANT
PENCIL BUTTON HOLSTER BLD 10FT (ELECTRODE) ×4 IMPLANT
SET SHEATH INTRODUCER 10FR (MISCELLANEOUS) IMPLANT
SHEATH COOK PEEL AWAY SET 9F (SHEATH) IMPLANT
SHEET MEDIUM DRAPE 40X70 STRL (DRAPES) ×4 IMPLANT
SLEEVE SCD COMPRESS KNEE MED (MISCELLANEOUS) ×4 IMPLANT
SPONGE LAP 4X18 RFD (DISPOSABLE) ×6 IMPLANT
STRIP CLOSURE SKIN 1/2X4 (GAUZE/BANDAGES/DRESSINGS) IMPLANT
SUT MNCRL AB 4-0 PS2 18 (SUTURE) ×10 IMPLANT
SUT PROLENE 2 0 CT2 30 (SUTURE) ×4 IMPLANT
SUT SILK 2 0 SH (SUTURE) ×4 IMPLANT
SUT VIC AB 2-0 CT1 27 (SUTURE)
SUT VIC AB 2-0 CT1 TAPERPNT 27 (SUTURE) IMPLANT
SUT VIC AB 3-0 SH 27 (SUTURE)
SUT VIC AB 3-0 SH 27X BRD (SUTURE) IMPLANT
SUT VICRYL 3-0 CR8 SH (SUTURE) ×6 IMPLANT
SYR 10ML LL (SYRINGE) ×8 IMPLANT
SYR 5ML LUER SLIP (SYRINGE) ×4 IMPLANT
TOWEL GREEN STERILE FF (TOWEL DISPOSABLE) ×6 IMPLANT
TOWEL OR NON WOVEN STRL DISP B (DISPOSABLE) ×4 IMPLANT
TRAY FAXITRON CT DISP (TRAY / TRAY PROCEDURE) ×2 IMPLANT
TUBE CONNECTING 20'X1/4 (TUBING) ×1
TUBE CONNECTING 20X1/4 (TUBING) ×3 IMPLANT
YANKAUER SUCT BULB TIP NO VENT (SUCTIONS) ×4 IMPLANT

## 2018-08-15 NOTE — Anesthesia Procedure Notes (Signed)
Anesthesia Regional Block: Pectoralis block   Pre-Anesthetic Checklist: ,, timeout performed, Correct Patient, Correct Site, Correct Laterality, Correct Procedure, Correct Position, site marked, Risks and benefits discussed,  Surgical consent,  Pre-op evaluation,  At surgeon's request and post-op pain management  Laterality: Left  Prep: chloraprep       Needles:  Injection technique: Single-shot  Needle Type: Echogenic Needle     Needle Length: 9cm  Needle Gauge: 21     Additional Needles:   Procedures:,,,, ultrasound used (permanent image in chart),,,,  Narrative:  Start time: 08/15/2018 12:01 PM End time: 08/15/2018 12:06 PM Injection made incrementally with aspirations every 5 mL.  Performed by: Personally  Anesthesiologist: Catalina Gravel, MD  Additional Notes: No pain on injection. No increased resistance to injection. Injection made in 5cc increments.  Good needle visualization.  Patient tolerated procedure well.

## 2018-08-15 NOTE — Anesthesia Procedure Notes (Signed)
Procedure Name: LMA Insertion Date/Time: 08/15/2018 12:42 PM Performed by: Lyndee Leo, CRNA Pre-anesthesia Checklist: Patient identified, Emergency Drugs available, Suction available and Patient being monitored Patient Re-evaluated:Patient Re-evaluated prior to induction Oxygen Delivery Method: Circle system utilized Preoxygenation: Pre-oxygenation with 100% oxygen Induction Type: IV induction Ventilation: Mask ventilation without difficulty LMA: LMA inserted LMA Size: 4.0 Number of attempts: 1 Airway Equipment and Method: Bite block Placement Confirmation: positive ETCO2 Tube secured with: Tape Dental Injury: Teeth and Oropharynx as per pre-operative assessment

## 2018-08-15 NOTE — Progress Notes (Signed)
Assisted Dr. Turk with left, ultrasound guided, pectoralis block. Side rails up, monitors on throughout procedure. See vital signs in flow sheet. Tolerated Procedure well. 

## 2018-08-15 NOTE — Anesthesia Postprocedure Evaluation (Signed)
Anesthesia Post Note  Patient: Ellana Rameshchandra Irion  Procedure(s) Performed: LEFT BREAST LUMPECTOMY WITH RADIOACTIVE SEED AND LEFT AXILLARY DEEP SENTINEL LYMPH NODE BIOPSY INJECT BLUE DYE LEFT BREAST (Left Breast) INSERTION PORT-A-CATH WITH ULTRASOUND (Right Chest)     Patient location during evaluation: PACU Anesthesia Type: General Level of consciousness: awake and alert Pain management: pain level controlled Vital Signs Assessment: post-procedure vital signs reviewed and stable Respiratory status: spontaneous breathing, nonlabored ventilation and respiratory function stable Cardiovascular status: blood pressure returned to baseline and stable Postop Assessment: no apparent nausea or vomiting Anesthetic complications: no    Last Vitals:  Vitals:   08/15/18 1545 08/15/18 1600  BP: (!) 153/85 (!) 153/88  Pulse: 91 87  Resp: 15 16  Temp:  36.4 C  SpO2: 97% 98%    Last Pain:  Vitals:   08/15/18 1600  TempSrc:   PainSc: 0-No pain                 Catalina Gravel

## 2018-08-15 NOTE — Transfer of Care (Signed)
Immediate Anesthesia Transfer of Care Note  Patient: Gloria Mckinney  Procedure(s) Performed: LEFT BREAST LUMPECTOMY WITH RADIOACTIVE SEED AND LEFT AXILLARY DEEP SENTINEL LYMPH NODE BIOPSY INJECT BLUE DYE LEFT BREAST (Left Breast) INSERTION PORT-A-CATH WITH ULTRASOUND (Right Chest)  Patient Location: PACU  Anesthesia Type:GA combined with regional for post-op pain  Level of Consciousness: sedated and responds to stimulation  Airway & Oxygen Therapy: Patient Spontanous Breathing and Patient connected to face mask oxygen  Post-op Assessment: Report given to RN and Post -op Vital signs reviewed and stable  Post vital signs: Reviewed and stable  Last Vitals:  Vitals Value Taken Time  BP 161/90 08/15/2018  2:39 PM  Temp    Pulse 98 08/15/2018  2:41 PM  Resp 17 08/15/2018  2:41 PM  SpO2 100 % 08/15/2018  2:41 PM  Vitals shown include unvalidated device data.  Last Pain:  Vitals:   08/15/18 1215  TempSrc:   PainSc: 0-No pain         Complications: No apparent anesthesia complications

## 2018-08-15 NOTE — Discharge Instructions (Signed)
PORT-A-CATH: POST OP INSTRUCTIONS  Always review your discharge instruction sheet given to you by the facility where your surgery was performed.   1. A prescription for pain medication may be given to you upon discharge. Take your pain medication as prescribed, if needed. If narcotic pain medicine is not needed, then you make take acetaminophen (Tylenol) or ibuprofen (Advil) as needed. No Tylenol until 5:45pm if needed. No Ibuprofen until 6:45pm if needed. 2. Take your usually prescribed medications unless otherwise directed. 3. If you need a refill on your pain medication, please contact our office. All narcotic pain medicine now requires a paper prescription.  Phoned in and fax refills are no longer allowed by law.  Prescriptions will not be filled after 5 pm or on weekends.  4. You should follow a light diet for the remainder of the day after your procedure. 5. Most patients will experience some mild swelling and/or bruising in the area of the incision. It may take several days to resolve. 6. It is common to experience some constipation if taking pain medication after surgery. Increasing fluid intake and taking a stool softener (such as Colace) will usually help or prevent this problem from occurring. A mild laxative (Milk of Magnesia or Miralax) should be taken according to package directions if there are no bowel movements after 48 hours.  7. Unless discharge instructions indicate otherwise, you may remove your bandages 48 hours after surgery, and you may shower at that time. You may have steri-strips (small white skin tapes) in place directly over the incision.  These strips should be left on the skin for 7-10 days.  If your surgeon used Dermabond (skin glue) on the incision, you may shower in 24 hours.  The glue will flake off over the next 2-3 weeks.  8. If your port is left accessed at the end of surgery (needle left in port), the dressing cannot get wet and should only by changed by a  healthcare professional. When the port is no longer accessed (when the needle has been removed), follow step 7.   9. ACTIVITIES:  Limit activity involving your arms for the next 72 hours. Do no strenuous exercise or activity for 1 week. You may drive when you are no longer taking prescription pain medication, you can comfortably wear a seatbelt, and you can maneuver your car. 10.You may need to see your doctor in the office for a follow-up appointment.  Please       check with your doctor.  11.When you receive a new Port-a-Cath, you will get a product guide and        ID card.  Please keep them in case you need them.  WHEN TO CALL YOUR DOCTOR [phone]): 1. Fever over 101.0 2. Chills 3. Continued bleeding from incision 4. Increased redness and tenderness at the site 5. Shortness of breath, difficulty breathing   The clinic staff is available to answer your questions during regular business hours. Please dont hesitate to call and ask to speak to one of the nurses or medical assistants for clinical concerns. If you have a medical emergency, go to the nearest emergency room or call 911.  A surgeon from [facility] is always on call at the hospital.     For further information, please visit [email or web address]    [employer] Phone Number [phone]        BREAST BIOPSY/ LUMPECTOMY: POST OP INSTRUCTIONS  Always review your discharge instruction sheet given to  you by the facility where your surgery was performed.  IF YOU HAVE DISABILITY OR FAMILY LEAVE FORMS, YOU MUST BRING THEM TO THE OFFICE FOR PROCESSING.  DO NOT GIVE THEM TO YOUR DOCTOR.  1. A prescription for pain medication may be given to you upon discharge.  Take your pain medication as prescribed, if needed.  If narcotic pain medicine is not needed, then you may take acetaminophen (Tylenol) or ibuprofen (Advil) as needed. 2. Take your usually prescribed medications unless  otherwise directed 3. If you need a refill on your pain medication, please contact your pharmacy.  They will contact our office to request authorization.  Prescriptions will not be filled after 5pm or on week-ends. 4. You should eat very light the first 24 hours after surgery, such as soup, crackers, pudding, etc.  Resume your normal diet the day after surgery. 5. Most patients will experience some swelling and bruising in the breast.  Ice packs and a good support bra will help.  Swelling and bruising can take several days to resolve.  6. It is common to experience some constipation if taking pain medication after surgery.  Increasing fluid intake and taking a stool softener will usually help or prevent this problem from occurring.  A mild laxative (Milk of Magnesia or Miralax) should be taken according to package directions if there are no bowel movements after 48 hours. 7. Unless discharge instructions indicate otherwise, you may remove your bandages 24-48 hours after surgery, and you may shower at that time.  You may have steri-strips (small skin tapes) in place directly over the incision.  These strips should be left on the skin for 7-10 days.  If your surgeon used skin glue on the incision, you may shower in 24 hours.  The glue will flake off over the next 2-3 weeks.  Any sutures or staples will be removed at the office during your follow-up visit. 8. ACTIVITIES:  You may resume regular daily activities (gradually increasing) beginning the next day.  Wearing a good support bra or sports bra minimizes pain and swelling.  You may have sexual intercourse when it is comfortable. a. You may drive when you no longer are taking prescription pain medication, you can comfortably wear a seatbelt, and you can safely maneuver your car and apply brakes. b. RETURN TO WORK:  ______________________________________________________________________________________ 9. You should see your doctor in the office for a  follow-up appointment approximately two weeks after your surgery.  Your doctors nurse will typically make your follow-up appointment when she calls you with your pathology report.  Expect your pathology report 2-3 business days after your surgery.  You may call to check if you do not hear from [place] after three days. 10. OTHER INSTRUCTIONS: _______________________________________________________________________________________________ _____________________________________________________________________________________________________________________________________ _____________________________________________________________________________________________________________________________________ _____________________________________________________________________________________________________________________________________  WHEN TO CALL YOUR DOCTOR: 1. Fever over 101.0 2. Nausea and/or vomiting. 3. Extreme swelling or bruising. 4. Continued bleeding from incision. 5. Increased pain, redness, or drainage from the incision.  The clinic staff is available to answer your questions during regular business hours.  Please dont hesitate to call and ask to speak to one of the nurses for clinical concerns.  If you have a medical emergency, go to the nearest emergency room or call 911.  A surgeon from [facility] is always on call at the hospital.  For further questions, please visit [email or web address]    Post Anesthesia Home Care Instructions  Activity: Get plenty of rest for the remainder of the day. A responsible individual must stay  with you for 24 hours following the procedure.  For the next 24 hours, DO NOT: -Drive a car -Paediatric nurse -Drink alcoholic beverages -Take any medication unless instructed by your physician -Make any legal decisions or sign important papers.  Meals: Start with liquid foods such as gelatin or soup. Progress to regular foods as  tolerated. Avoid greasy, spicy, heavy foods. If nausea and/or vomiting occur, drink only clear liquids until the nausea and/or vomiting subsides. Call your physician if vomiting continues.  Special Instructions/Symptoms: Your throat may feel dry or sore from the anesthesia or the breathing tube placed in your throat during surgery. If this causes discomfort, gargle with warm salt water. The discomfort should disappear within 24 hours.  If you had a scopolamine patch placed behind your ear for the management of post- operative nausea and/or vomiting:  1. The medication in the patch is effective for 72 hours, after which it should be removed.  Wrap patch in a tissue and discard in the trash. Wash hands thoroughly with soap and water. 2. You may remove the patch earlier than 72 hours if you experience unpleasant side effects which may include dry mouth, dizziness or visual disturbances. 3. Avoid touching the patch. Wash your hands with soap and water after contact with the patch.

## 2018-08-15 NOTE — Anesthesia Preprocedure Evaluation (Signed)
Anesthesia Evaluation  Patient identified by MRN, date of birth, ID band Patient awake  General Assessment Comment: left breast cancer  Reviewed: Allergy & Precautions, NPO status , Patient's Chart, lab work & pertinent test results  Airway Mallampati: II  TM Distance: >3 FB Neck ROM: Full    Dental  (+) Teeth Intact, Dental Advisory Given   Pulmonary neg pulmonary ROS,    Pulmonary exam normal breath sounds clear to auscultation       Cardiovascular hypertension, Pt. on medications Normal cardiovascular exam Rhythm:Regular Rate:Normal     Neuro/Psych  Neuromuscular disease    GI/Hepatic Neg liver ROS, GERD  Medicated,  Endo/Other  diabetes, Type 2, Insulin Dependent  Renal/GU negative Renal ROS     Musculoskeletal negative musculoskeletal ROS (+)   Abdominal   Peds  Hematology negative hematology ROS (+)   Anesthesia Other Findings Day of surgery medications reviewed with the patient.  Reproductive/Obstetrics                             Anesthesia Physical Anesthesia Plan  ASA: III  Anesthesia Plan: General   Post-op Pain Management:    Induction: Intravenous  PONV Risk Score and Plan: 3 and Midazolam, Dexamethasone and Ondansetron  Airway Management Planned: LMA  Additional Equipment:   Intra-op Plan:   Post-operative Plan: Extubation in OR  Informed Consent: I have reviewed the patients History and Physical, chart, labs and discussed the procedure including the risks, benefits and alternatives for the proposed anesthesia with the patient or authorized representative who has indicated his/her understanding and acceptance.   Dental advisory given  Plan Discussed with: CRNA  Anesthesia Plan Comments:         Anesthesia Quick Evaluation

## 2018-08-15 NOTE — Progress Notes (Signed)
Assisted nuclear medicine with procedure and time out performed

## 2018-08-15 NOTE — Op Note (Signed)
Patient Name:           Ermel Verne   Date of Surgery:        08/15/2018  Pre op Diagnosis:      Invasive cancer left breast, upper inner quadrant, triple positive breast cancer, clinical stage T1c, N0  Post op Diagnosis:    Same  Procedure:                 Inject blue dye left breast                                      Left breast lumpectomy with radioactive seed localization                                      Left axillary deep sentinel lymph node biopsy                                      Insertion PowerPort [place] 8 French tunneled venous vascular access device                                      Use of fluoroscopy for guidance and positioning  Surgeon:                     Edsel Petrin. Dalbert Batman, M.D., FACS  Assistant:                      OR staff  Operative Indications:    This is a very pleasant 73 year old female from [place] who  was referred by Dr. Lovey Newcomer at the [facility] for her triple positive invasive cancer left breast upper inner quadrant.     The only prior breast problems was in 2017. She apparently had a core biopsy of the left breast lower outer quadrant which was negative She gets annual screening and was recalled. They found a 1.8 cm mass in the left breast, 10 o'clock position, 5 cm from the nipple. Left axillary ultrasound okay. Core biopsy was performed. Clip placement is good. Pathology shows invasive ductal carcinoma, grade 3. ER 80%. PR 70%. Ki-67 40%. HER-2/neu strongly positive. Family history negative for breast ovarian or prostate cancer      . We talked about surgical options including lumpectomy sentinel node biopsy and radiation therapy. We talked about mastectomy and sentinel node biopsy. We talked about systemic chemotherapy and the high likelihood that she would be offered that. We talked about Port-A-Cath insertion.  She has seen Dr. Lindi Adie who states that she will need chemotherapy in the adjuvant setting.  Our recommendation is  Port-A-Cath insertion, lumpectomy and sentinel node biopsy.  This is also the consensus recommendation from our citywide breast conference.  She and her family agree with this plan  Operative Findings:       The port was placed through the right subclavian vein without difficulty.  It appeared well positioned by C arm.  The tip of the catheter was in the superior vena cava near the right atrium and the catheter flushed well and had excellent blood return.     The cancer was high  in the upper inner quadrant of the left breast.  The broad anterior margin is the skin.  The broad posterior margin is the pectoralis muscle.  The specimen mammogram looked good and the tumor and the radioactive seed were in the center of the specimen.  I found 3 or 4 sentinel lymph nodes which were not pathologically enlarged.  Procedure in Detail:          The patient underwent pectoral block in the holding area.  The patient underwent injection of technetium 99 in the holding area by the nuclear medicine technician.  The patient was taken to the operating room and underwent general anesthesia with LMA device.  Intravenous antibiotics were given.  Surgical timeout was performed.  Following skin prep I injected 5 cc of dilute methylene blue into the left breast subareolar area and massaged the breast for a minute or 2.  The patient was positioned with a small roll behind her shoulders and her arms tucked at her sides.  0.25% Marcaine with epinephrine was used as a local infiltration anesthetic throughout the case      The entire neck and chest wall were then prepped and draped in a sterile fashion.  A right subclavian venipuncture was performed with good blood return.  Guidewire was inserted into the superior vena cava under fluoroscopic guidance.  Using the C arm I marked a template on the chest wall to guide positioning and length of catheter so that the tip of the catheter would be in the superior vena cava just above the right  atrium.  A small incision was made at the wire insertion site.  A transverse incision was made 2 or 3 cm below the mid clavicle on the right side.  A subcutaneous pocket was created.  Using a tunneling device I passed the catheter from the port pocket site to the wire insertion site.  Using the template marked on the chest wall I cut the catheter 24 cm in length.  The catheter was secured to the port with the locking device and the port and catheter flushed with heparinized saline.  The port was sutured to the pectoralis fascia with 3 interrupted sutures of 2-0 Prolene.  The dilator and peel-away sheath were inserted over the guidewire into the central venous circulation without difficulty.  The dilator and wire were removed and the catheter threaded through the peel-away sheath and the peel-away sheath removed.  I had excellent blood return and the catheter flushed easily.  Fluoroscopy confirmed that the catheter tip was in the superior vena cava just above the right atrium and there was no deformity of the catheter anywhere along its course.  The port and catheter were then flushed with concentrated heparin solution.  Subcutaneous tissue was closed with interrupted 3-0 Vicryl sutures and the skin incisions were closed with subcuticular 4-0 Monocryl and Dermabond.     We then repositioned the patient with her arms out at her sides.  We prepped the entire left chest wall and axilla.  Using the neoprobe I identified the area of maximal radioactivity in the upper inner left breast.  A curvilinear incision was made in this position with a knife.  The lumpectomy was performed with the neoprobe and electrocautery.  The specimen was removed and marked with silk sutures and a 6 color ink kit.  The specimen mammogram looked very good as described above.  The specimen was sent to the lab where the seed was retrieved.  The lumpectomy incision was irrigated.  Hemostasis was excellent.  I placed 5 metal marker clips in the  walls of the lumpectomy cavity.  The breast tissues were reapproximated with 3-0 Vicryl sutures and the skin closed with a running subcuticular 4-0 Monocryl and Dermabond.     I made an incision in a skin crease in the left axilla right at the hairline.  Dissection was carried down through the clavipectoral fascia entering the axillary space.  Using the neoprobe I  identified about 4 sentinel lymph nodes and sent all of these to the lab.  There was no more radioactivity at this point.  Hemostasis was excellent and had been achieved electrocautery and some Vicryl ties.  The wound was copiously irrigated.  It was completely dry.  The clavipectoral fascia was closed with 3-0 Vicryl sutures and the skin closed with a running subcuticular 4-0 Monocryl and Dermabond.  Clean bandages and a breast binder were placed.  The patient tolerated the procedure well and was taken to PACU in stable condition.  EBL 30 to 40 cc.  Counts correct.  Complications none.    Addendum: I logged onto the [employer] and reviewed her prescription medication history     Teaghan Formica M. Dalbert Batman, M.D., FACS General and Minimally Invasive Surgery Breast and Colorectal Surgery  08/15/2018 2:52 PM

## 2018-08-15 NOTE — Interval H&P Note (Signed)
History and Physical Interval Note:  08/15/2018 12:01 PM  Gloria Mckinney  has presented today for surgery, with the diagnosis of left breast cancer  The various methods of treatment have been discussed with the patient and family. After consideration of risks, benefits and other options for treatment, the patient has consented to  Procedure(s): LEFT BREAST LUMPECTOMY WITH RADIOACTIVE SEED AND LEFT AXILLARY DEEP SENTINEL LYMPH NODE BIOPSY INJECT BLUE DYE LEFT BREAST (Left) INSERTION PORT-A-CATH WITH ULTRASOUND (N/A) as a surgical intervention .  The patient's history has been reviewed, patient examined, no change in status, stable for surgery.  I have reviewed the patient's chart and labs.  Questions were answered to the patient's satisfaction.     Adin Hector

## 2018-08-17 ENCOUNTER — Encounter (HOSPITAL_BASED_OUTPATIENT_CLINIC_OR_DEPARTMENT_OTHER): Payer: Self-pay | Admitting: General Surgery

## 2018-08-18 NOTE — Progress Notes (Signed)
Patient Care Team: Tia Alert, PA as PCP - General (Neurology)  DIAGNOSIS:    ICD-10-CM   1. Malignant neoplasm of upper-inner quadrant of left breast in female, estrogen receptor positive ([place]) C50.212    Z17.0     SUMMARY OF ONCOLOGIC HISTORY:   Malignant neoplasm of upper-inner quadrant of left breast in female, estrogen receptor positive ([place])   07/18/2018 Initial Diagnosis    Screening detected left breast mass at 10 o'clock position 1.8 cm axilla negative, biopsy revealed grade 3 IDC ER 80%, PR 70%, Ki-67 40%, HER-2 +3+ by IHC with heterogeneity, T1c N0 stage Ia clinical stage    08/15/2018 Surgery    Left Lumpectomy: IDC grade 3, 1.9 cm, Posterior margin positive (Muscle) due to a transected satellite nodule 0.5 cm, 0/3 LN Neg, ER 80%, PR 70%, Ki-67 40%, HER-2 +3+ by IHC with heterogeneity T1cN0 Stage 1A     CHIEF COMPLIANT: Follow-up of left breast cancer s/p lumpectomy  INTERVAL HISTORY: Gloria Mckinney is a 73 y.o. with above-mentioned history of left breast cancer. She had a lumpectomy of the left breast on 08/15/18 for which pathology confirmed the stage 1a cancer to be 1.9cm, grade 3, HER 2 positive, ER 80%, PR 70%, Ki67 40%, with involved posterior margin and all 3 lymph nodes were negative. She had an ECHO today that showed an ejection fraction in the range of 55-60%. She presents to the clinic today with her daughters to review the surgical pathology report and discuss upcoming chemotherapy treatment. She has a follow-up appointment with Dr. Dalbert Batman on 09/05/18.   REVIEW OF SYSTEMS:   Constitutional: Denies fevers, chills or abnormal weight loss Eyes: Denies blurriness of vision Ears, nose, mouth, throat, and face: Denies mucositis or sore throat Respiratory: Denies cough, dyspnea or wheezes Cardiovascular: Denies palpitation, chest discomfort Gastrointestinal:  Denies nausea, heartburn or change in bowel habits Skin: Denies abnormal skin  rashes Lymphatics: Denies new lymphadenopathy or easy bruising Neurological: Denies numbness, tingling or new weaknesses Behavioral/Psych: Mood is stable, no new changes  Extremities: No lower extremity edema Breast: denies any pain or lumps or nodules in either breasts All other systems were reviewed with the patient and are negative.  I have reviewed the past medical history, past surgical history, social history and family history with the patient and they are unchanged from previous note.  ALLERGIES:  is allergic to penicillins; cephalexin; and statins.  MEDICATIONS:  Current Outpatient Medications  Medication Sig Dispense Refill  . aspirin EC 81 MG tablet Take 81 mg by mouth daily.    . Calcium Carb-Cholecalciferol (CALCIUM + VITAMIN D3 PO) Take 1 tablet by mouth daily.    . Empagliflozin-metFORMIN HCl (SYNJARDY) 12.12-998 MG TABS Take 1 tablet by mouth 2 (two) times daily.    Marland Kitchen EXFORGE 5-320 MG tablet Take 1 tablet by mouth daily.    . ferrous sulfate 325 (65 FE) MG tablet Take 325 mg by mouth daily with breakfast.    . hydrochlorothiazide (HYDRODIURIL) 25 MG tablet Take 25 mg by mouth daily.    Marland Kitchen HYDROcodone-acetaminophen (NORCO) 5-325 MG tablet Take 1-2 tablets by mouth every 6 (six) hours as needed for moderate pain or severe pain. 20 tablet 0  . Insulin Glargine, 1 Unit Dial, (TOUJEO SOLOSTAR) 300 UNIT/ML SOPN Inject 50 Units into the skin every evening.    Marland Kitchen omeprazole (PRILOSEC) 40 MG capsule Take 40 mg by mouth every morning.     . simvastatin (ZOCOR) 40 MG tablet Take 40  mg by mouth daily.     No current facility-administered medications for this visit.     PHYSICAL EXAMINATION: ECOG PERFORMANCE STATUS: 1 - Symptomatic but completely ambulatory  Vitals:   08/23/18 1314  BP: 131/81  Pulse: 89  Resp: 17  Temp: 98 F (36.7 C)  SpO2: 100%   Filed Weights   08/23/18 1314  Weight: 164 lb 11.2 oz (74.7 kg)    GENERAL: alert, no distress and comfortable SKIN: skin  color, texture, turgor are normal, no rashes or significant lesions EYES: normal, Conjunctiva are pink and non-injected, sclera clear OROPHARYNX: no exudate, no erythema and lips, buccal mucosa, and tongue normal  NECK: supple, thyroid normal size, non-tender, without nodularity LYMPH: no palpable lymphadenopathy in the cervical, axillary or inguinal LUNGS: clear to auscultation and percussion with normal breathing effort HEART: regular rate & rhythm and no murmurs and no lower extremity edema ABDOMEN: abdomen soft, non-tender and normal bowel sounds MUSCULOSKELETAL: no cyanosis of digits and no clubbing  NEURO: alert & oriented x 3 with fluent speech, no focal motor/sensory deficits EXTREMITIES: No lower extremity edema  LABORATORY DATA:  I have reviewed the data as listed CMP Latest Ref Rng & Units 07/15/2018  Glucose 70 - 99 mg/dL 260(H)  BUN 8 - 23 mg/dL 12  Creatinine 0.44 - 1.00 mg/dL 0.88  Sodium 135 - 145 mmol/L 138  Potassium 3.5 - 5.1 mmol/L 4.1  Chloride 98 - 111 mmol/L 104  CO2 22 - 32 mmol/L 23  Calcium 8.9 - 10.3 mg/dL 9.5  Total Protein 6.5 - 8.1 g/dL 7.1  Total Bilirubin 0.3 - 1.2 mg/dL 0.5  Alkaline Phos 38 - 126 U/L 76  AST 15 - 41 U/L 19  ALT 0 - 44 U/L 19    Lab Results  Component Value Date   WBC 6.2 07/15/2018   HGB 12.4 07/15/2018   HCT 39.2 07/15/2018   MCV 83.4 07/15/2018   PLT 255 07/15/2018   NEUTROABS 3.7 07/15/2018    ASSESSMENT & PLAN:  Malignant neoplasm of upper-inner quadrant of left breast in female, estrogen receptor positive ([place]) 08/15/18: Left Lumpectomy: IDC grade 3, 1.9 cm, Posterior margin positive (Muscle) due to a transected satellite nodule 0.5 cm, 0/3 LN Neg, ER 80%, PR 70%, Ki-67 40%, HER-2 +3+ by IHC with heterogeneity T1cN0 Stage 1A  Pathology counseling: I discussed the final pathology report of the patient provided  a copy of this report. I discussed the margins as well as lymph node surgeries. We also discussed the final  staging along with previously performed ER/PR and HER-2/neu testing.  Posterior Margin: based on discussions with surgery, it is muscle and hence no further surgery is possible.  Plan:  1. Adjuvant Taxol Herceptin weekly x12 followed by Herceptin maintenance for 1 year 2.  Adjuvant radiation therapy 3.  Followed by adjuvant antiestrogen therapy  RTC in 3 weeks to start chemo    No orders of the defined types were placed in this encounter.  The patient has a good understanding of the overall plan. she agrees with it. she will call with any problems that may develop before the next visit here.  Nicholas Lose, MD 08/23/2018  Julious Oka Dorshimer am acting as scribe for Dr. Nicholas Lose.  I have reviewed the above documentation for accuracy and completeness, and I agree with the above.

## 2018-08-23 ENCOUNTER — Inpatient Hospital Stay: Payer: Federal, State, Local not specified - PPO

## 2018-08-23 ENCOUNTER — Inpatient Hospital Stay
Payer: Federal, State, Local not specified - PPO | Attending: Hematology and Oncology | Admitting: Hematology and Oncology

## 2018-08-23 ENCOUNTER — Ambulatory Visit (HOSPITAL_COMMUNITY)
Admission: RE | Admit: 2018-08-23 | Discharge: 2018-08-23 | Disposition: A | Discharge status: Home / Self Care | Payer: Federal, State, Local not specified - PPO | Source: Ambulatory Visit | Attending: Hematology and Oncology | Admitting: Hematology and Oncology

## 2018-08-23 ENCOUNTER — Telehealth: Payer: Self-pay | Admitting: Hematology and Oncology

## 2018-08-23 DIAGNOSIS — Z5111 Encounter for antineoplastic chemotherapy: Secondary | ICD-10-CM | POA: Diagnosis not present

## 2018-08-23 DIAGNOSIS — C50212 Malignant neoplasm of upper-inner quadrant of left female breast: Secondary | ICD-10-CM

## 2018-08-23 DIAGNOSIS — E119 Type 2 diabetes mellitus without complications: Secondary | ICD-10-CM | POA: Diagnosis not present

## 2018-08-23 DIAGNOSIS — I10 Essential (primary) hypertension: Secondary | ICD-10-CM | POA: Insufficient documentation

## 2018-08-23 DIAGNOSIS — Z7982 Long term (current) use of aspirin: Secondary | ICD-10-CM

## 2018-08-23 DIAGNOSIS — Z5112 Encounter for antineoplastic immunotherapy: Secondary | ICD-10-CM

## 2018-08-23 DIAGNOSIS — Z170 Estrogen receptor positive status [ER+]: Secondary | ICD-10-CM

## 2018-08-23 DIAGNOSIS — Z794 Long term (current) use of insulin: Secondary | ICD-10-CM

## 2018-08-23 DIAGNOSIS — E785 Hyperlipidemia, unspecified: Secondary | ICD-10-CM | POA: Insufficient documentation

## 2018-08-23 DIAGNOSIS — Z79899 Other long term (current) drug therapy: Secondary | ICD-10-CM | POA: Diagnosis not present

## 2018-08-23 DIAGNOSIS — Z9221 Personal history of antineoplastic chemotherapy: Secondary | ICD-10-CM | POA: Diagnosis not present

## 2018-08-23 NOTE — Progress Notes (Signed)
  Echocardiogram 2D Echocardiogram has been performed.  Gloria Mckinney 08/23/2018, 11:50 AM

## 2018-08-23 NOTE — Telephone Encounter (Signed)
Gave avs and calendar ° °

## 2018-08-23 NOTE — Assessment & Plan Note (Signed)
08/15/18: Left Lumpectomy: IDC grade 3, 1.9 cm, Posterior margin positive (Muscle) due to a transected satellite nodule 0.5 cm, 0/3 LN Neg, ER 80%, PR 70%, Ki-67 40%, HER-2 +3+ by IHC with heterogeneity T1cN0 Stage 1A  Pathology counseling: I discussed the final pathology report of the patient provided  a copy of this report. I discussed the margins as well as lymph node surgeries. We also discussed the final staging along with previously performed ER/PR and HER-2/neu testing.  Posterior Margin: based on discussions with surgery, it is muscle and hence no further surgery is possible.  Plan:  1. Adjuvant Taxol Herceptin weekly x12 followed by Herceptin maintenance for 1 year 2.  Adjuvant radiation therapy 3.  Followed by adjuvant antiestrogen therapy  RTC in 3 weeks to start chemo

## 2018-08-27 ENCOUNTER — Other Ambulatory Visit: Payer: Self-pay | Admitting: Hematology and Oncology

## 2018-08-27 DIAGNOSIS — C50212 Malignant neoplasm of upper-inner quadrant of left female breast: Secondary | ICD-10-CM

## 2018-08-27 DIAGNOSIS — Z170 Estrogen receptor positive status [ER+]: Principal | ICD-10-CM

## 2018-08-27 MED ORDER — LIDOCAINE-PRILOCAINE 2.5-2.5 % EX CREA: TOPICAL_CREAM | CUTANEOUS | 3 refills | Status: DC

## 2018-08-27 MED ORDER — ONDANSETRON HCL 8 MG PO TABS: 8.0000 mg | ORAL_TABLET | Freq: Two times a day (BID) | ORAL | 1 refills | Status: DC | PRN

## 2018-08-27 MED ORDER — PROCHLORPERAZINE MALEATE 10 MG PO TABS: 10.0000 mg | ORAL_TABLET | Freq: Four times a day (QID) | ORAL | 1 refills | Status: DC | PRN

## 2018-08-27 NOTE — Progress Notes (Signed)
START ON PATHWAY REGIMEN - Breast   Paclitaxel Weekly + Trastuzumab Weekly:   Administer weekly:     Paclitaxel      Trastuzumab-xxxx      Trastuzumab-xxxx   **Always confirm dose/schedule in your pharmacy ordering system**  Trastuzumab (Maintenance - NO Loading Dose):   A cycle is every 21 days:     Trastuzumab-xxxx   **Always confirm dose/schedule in your pharmacy ordering system**  Patient Characteristics: Postoperative without Neoadjuvant Therapy (Pathologic Staging), Invasive Disease, Adjuvant Therapy, HER2 Positive, ER Positive, Node Negative, pT1c, pN0/N1mi Therapeutic Status: Postoperative without Neoadjuvant Therapy (Pathologic Staging) AJCC Grade: G3 AJCC N Category: pN0 AJCC M Category: cM0 ER Status: Positive (+) AJCC 8 Stage Grouping: IA HER2 Status: Positive (+) Oncotype Dx Recurrence Score: Not Appropriate AJCC T Category: pT1c PR Status: Positive (+) Intent of Therapy: Curative Intent, Discussed with Patient 

## 2018-09-08 ENCOUNTER — Telehealth: Payer: Self-pay | Admitting: Hematology and Oncology

## 2018-09-08 NOTE — Telephone Encounter (Signed)
VG PAL 1/31 - per instruction provided cxd 1/31 f/u not needed due to 1st treatment. Patient has other appointments, including f/u on schedule. Spoke with dtr re change and new time for 1/31 @ 10:45 am. Other appointments remain the same.

## 2018-09-09 ENCOUNTER — Inpatient Hospital Stay: Payer: Federal, State, Local not specified - PPO

## 2018-09-09 ENCOUNTER — Encounter: Payer: Self-pay | Admitting: Hematology and Oncology

## 2018-09-09 ENCOUNTER — Inpatient Hospital Stay: Payer: Federal, State, Local not specified - PPO | Admitting: Hematology and Oncology

## 2018-09-09 VITALS — BP 163/82 | HR 86 | Temp 99.1°F | Resp 16

## 2018-09-09 DIAGNOSIS — C50212 Malignant neoplasm of upper-inner quadrant of left female breast: Secondary | ICD-10-CM

## 2018-09-09 DIAGNOSIS — Z95828 Presence of other vascular implants and grafts: Secondary | ICD-10-CM

## 2018-09-09 DIAGNOSIS — Z170 Estrogen receptor positive status [ER+]: Principal | ICD-10-CM

## 2018-09-09 LAB — CMP (CANCER CENTER ONLY)
ALT: 11 U/L (ref 0–44)
AST: 10 U/L — ABNORMAL LOW (ref 15–41)
Albumin: 3.6 g/dL (ref 3.5–5.0)
Alkaline Phosphatase: 81 U/L (ref 38–126)
Anion gap: 11 (ref 5–15)
BUN: 11 mg/dL (ref 8–23)
CO2: 22 mmol/L (ref 22–32)
Calcium: 9.9 mg/dL (ref 8.9–10.3)
Chloride: 104 mmol/L (ref 98–111)
Creatinine: 0.93 mg/dL (ref 0.44–1.00)
GFR, Est AFR Am: >60 mL/min (ref >60)
GFR, Estimated: >60 mL/min (ref >60)
Glucose, Bld: 202 mg/dL — ABNORMAL HIGH (ref 70–99)
Potassium: 4 mmol/L (ref 3.5–5.1)
SODIUM: 137 mmol/L (ref 135–145)
Total Bilirubin: 0.3 mg/dL (ref 0.3–1.2)
Total Protein: 7.2 g/dL (ref 6.5–8.1)

## 2018-09-09 LAB — CBC WITH DIFFERENTIAL (CANCER CENTER ONLY)
Abs Immature Granulocytes: 0.02 10*3/uL (ref 0.00–0.07)
Basophils Absolute: 0 10*3/uL (ref 0.0–0.1)
Basophils Relative: 0 %
Eosinophils Absolute: 0.2 10*3/uL (ref 0.0–0.5)
Eosinophils Relative: 3 %
HCT: 36.1 % (ref 36.0–46.0)
Hemoglobin: 11.6 g/dL — ABNORMAL LOW (ref 12.0–15.0)
IMMATURE GRANULOCYTES: 0 %
Lymphocytes Relative: 26 %
Lymphs Abs: 1.9 10*3/uL (ref 0.7–4.0)
MCH: 26.7 pg (ref 26.0–34.0)
MCHC: 32.1 g/dL (ref 30.0–36.0)
MCV: 83.2 fL (ref 80.0–100.0)
Monocytes Absolute: 0.8 10*3/uL (ref 0.1–1.0)
Monocytes Relative: 10 %
NEUTROS PCT: 61 %
Neutro Abs: 4.6 10*3/uL (ref 1.7–7.7)
Platelet Count: 229 10*3/uL (ref 150–400)
RBC: 4.34 MIL/uL (ref 3.87–5.11)
RDW: 13.7 % (ref 11.5–15.5)
WBC Count: 7.6 10*3/uL (ref 4.0–10.5)
nRBC: 0 % (ref 0.0–0.2)

## 2018-09-09 MED ORDER — SODIUM CHLORIDE 0.9 % IV SOLN
20.0000 mg | Freq: Once | INTRAVENOUS | Status: AC
  Administered 2018-09-09: 20 mg via INTRAVENOUS
  Filled 2018-09-09: qty 2

## 2018-09-09 MED ORDER — DIPHENHYDRAMINE HCL 50 MG/ML IJ SOLN
INTRAMUSCULAR | Status: AC
  Filled 2018-09-09: qty 1

## 2018-09-09 MED ORDER — ACETAMINOPHEN 325 MG PO TABS
650.0000 mg | ORAL_TABLET | Freq: Once | ORAL | Status: AC
  Administered 2018-09-09: 650 mg via ORAL

## 2018-09-09 MED ORDER — SODIUM CHLORIDE 0.9 % IV SOLN
Freq: Once | INTRAVENOUS | Status: AC
  Administered 2018-09-09: 12:00:00 via INTRAVENOUS
  Filled 2018-09-09: qty 250

## 2018-09-09 MED ORDER — SODIUM CHLORIDE 0.9% FLUSH
10.0000 mL | INTRAVENOUS | Status: DC | PRN
  Administered 2018-09-09: 10 mL via INTRAVENOUS
  Filled 2018-09-09: qty 10

## 2018-09-09 MED ORDER — FAMOTIDINE IN NACL 20-0.9 MG/50ML-% IV SOLN
20.0000 mg | Freq: Once | INTRAVENOUS | Status: AC
  Administered 2018-09-09: 20 mg via INTRAVENOUS

## 2018-09-09 MED ORDER — ACETAMINOPHEN 325 MG PO TABS
ORAL_TABLET | ORAL | Status: AC
  Filled 2018-09-09: qty 2

## 2018-09-09 MED ORDER — HEPARIN SOD (PORK) LOCK FLUSH 100 UNIT/ML IV SOLN
500.0000 [IU] | Freq: Once | INTRAVENOUS | Status: AC | PRN
  Administered 2018-09-09: 500 [IU]
  Filled 2018-09-09: qty 5

## 2018-09-09 MED ORDER — FAMOTIDINE IN NACL 20-0.9 MG/50ML-% IV SOLN
INTRAVENOUS | Status: AC
  Filled 2018-09-09: qty 50

## 2018-09-09 MED ORDER — TRASTUZUMAB CHEMO 150 MG IV SOLR
300.0000 mg | Freq: Once | INTRAVENOUS | Status: AC
  Administered 2018-09-09: 300 mg via INTRAVENOUS
  Filled 2018-09-09: qty 14.29

## 2018-09-09 MED ORDER — DIPHENHYDRAMINE HCL 50 MG/ML IJ SOLN
50.0000 mg | Freq: Once | INTRAMUSCULAR | Status: AC
  Administered 2018-09-09: 50 mg via INTRAVENOUS

## 2018-09-09 MED ORDER — SODIUM CHLORIDE 0.9 % IV SOLN
80.0000 mg/m2 | Freq: Once | INTRAVENOUS | Status: AC
  Administered 2018-09-09: 150 mg via INTRAVENOUS
  Filled 2018-09-09: qty 25

## 2018-09-09 MED ORDER — SODIUM CHLORIDE 0.9% FLUSH
10.0000 mL | INTRAVENOUS | Status: DC | PRN
  Administered 2018-09-09: 10 mL
  Filled 2018-09-09: qty 10

## 2018-09-09 NOTE — Patient Instructions (Signed)
[facility] Discharge Instructions for Patients Receiving Chemotherapy  Today you received the following chemotherapy agents Herceptin and Taxol  To help prevent nausea and vomiting after your treatment, we encourage you to take your nausea medication as directed.    If you develop nausea and vomiting that is not controlled by your nausea medication, call the clinic.   BELOW ARE SYMPTOMS THAT SHOULD BE REPORTED IMMEDIATELY:  *FEVER GREATER THAN 100.5 F  *CHILLS WITH OR WITHOUT FEVER  NAUSEA AND VOMITING THAT IS NOT CONTROLLED WITH YOUR NAUSEA MEDICATION  *UNUSUAL SHORTNESS OF BREATH  *UNUSUAL BRUISING OR BLEEDING  TENDERNESS IN MOUTH AND THROAT WITH OR WITHOUT PRESENCE OF ULCERS  *URINARY PROBLEMS  *BOWEL PROBLEMS  UNUSUAL RASH Items with * indicate a potential emergency and should be followed up as soon as possible.  Feel free to call the clinic should you have any questions or concerns. The clinic phone number is [phone].  Please show the [place] [facility] at check-in to the Emergency Department and triage nurse.   Trastuzumab injection for infusion What is this medicine? TRASTUZUMAB (tras TOO zoo mab) is a monoclonal antibody. It is used to treat breast cancer and stomach cancer. This medicine may be used for other purposes; ask your health care provider or pharmacist if you have questions. COMMON BRAND NAME(S): Herceptin, Calla Kicks, OGIVRI What should I tell my health care provider before I take this medicine? They need to know if you have any of these conditions: -heart disease -heart failure -lung or breathing disease, like asthma -an unusual or allergic reaction to trastuzumab, benzyl alcohol, or other medications, foods, dyes, or preservatives -pregnant or trying to get pregnant -breast-feeding How should I use this medicine? This drug is given as an infusion into a vein. It is administered in a hospital or clinic by a specially trained  health care professional. Talk to your pediatrician regarding the use of this medicine in children. This medicine is not approved for use in children. Overdosage: If you think you have taken too much of this medicine contact a poison control center or emergency room at once. NOTE: This medicine is only for you. Do not share this medicine with others. What if I miss a dose? It is important not to miss a dose. Call your doctor or health care professional if you are unable to keep an appointment. What may interact with this medicine? This medicine may interact with the following medications: -certain types of chemotherapy, such as daunorubicin, doxorubicin, epirubicin, and idarubicin This list may not describe all possible interactions. Give your health care provider a list of all the medicines, herbs, non-prescription drugs, or dietary supplements you use. Also tell them if you smoke, drink alcohol, or use illegal drugs. Some items may interact with your medicine. What should I watch for while using this medicine? Visit your doctor for checks on your progress. Report any side effects. Continue your course of treatment even though you feel ill unless your doctor tells you to stop. Call your doctor or health care professional for advice if you get a fever, chills or sore throat, or other symptoms of a cold or flu. Do not treat yourself. Try to avoid being around people who are sick. You may experience fever, chills and shaking during your first infusion. These effects are usually mild and can be treated with other medicines. Report any side effects during the infusion to your health care professional. Fever and chills usually do not happen with later  infusions. Do not become pregnant while taking this medicine or for 7 months after stopping it. Women should inform their doctor if they wish to become pregnant or think they might be pregnant. Women of child-bearing potential will need to have a negative  pregnancy test before starting this medicine. There is a potential for serious side effects to an unborn child. Talk to your health care professional or pharmacist for more information. Do not breast-feed an infant while taking this medicine or for 7 months after stopping it. Women must use effective birth control with this medicine. What side effects may I notice from receiving this medicine? Side effects that you should report to your doctor or health care professional as soon as possible: -allergic reactions like skin rash, itching or hives, swelling of the face, lips, or tongue -chest pain or palpitations -cough -dizziness -feeling faint or lightheaded, falls -fever -general ill feeling or flu-like symptoms -signs of worsening heart failure like breathing problems; swelling in your legs and feet -unusually weak or tired Side effects that usually do not require medical attention (report to your doctor or health care professional if they continue or are bothersome): -bone pain -changes in taste -diarrhea -joint pain -nausea/vomiting -weight loss This list may not describe all possible side effects. Call your doctor for medical advice about side effects. You may report side effects to FDA at 1-800-FDA-1088. Where should I keep my medicine? This drug is given in a hospital or clinic and will not be stored at home. NOTE: This sheet is a summary. It may not cover all possible information. If you have questions about this medicine, talk to your doctor, pharmacist, or health care provider.  2019 Elsevier/Gold Standard (2016-07-21 14:37:52)   Paclitaxel injection What is this medicine? PACLITAXEL (PAK li TAX el) is a chemotherapy drug. It targets fast dividing cells, like cancer cells, and causes these cells to die. This medicine is used to treat ovarian cancer, breast cancer, lung cancer, Kaposi's sarcoma, and other cancers. This medicine may be used for other purposes; ask your health  care provider or pharmacist if you have questions. COMMON BRAND NAME(S): Onxol, Taxol What should I tell my health care provider before I take this medicine? They need to know if you have any of these conditions: -history of irregular heartbeat -liver disease -low blood counts, like low white cell, platelet, or red cell counts -lung or breathing disease, like asthma -tingling of the fingers or toes, or other nerve disorder -an unusual or allergic reaction to paclitaxel, alcohol, polyoxyethylated castor oil, other chemotherapy, other medicines, foods, dyes, or preservatives -pregnant or trying to get pregnant -breast-feeding How should I use this medicine? This drug is given as an infusion into a vein. It is administered in a hospital or clinic by a specially trained health care professional. Talk to your pediatrician regarding the use of this medicine in children. Special care may be needed. Overdosage: If you think you have taken too much of this medicine contact a poison control center or emergency room at once. NOTE: This medicine is only for you. Do not share this medicine with others. What if I miss a dose? It is important not to miss your dose. Call your doctor or health care professional if you are unable to keep an appointment. What may interact with this medicine? Do not take this medicine with any of the following medications: -disulfiram -metronidazole This medicine may also interact with the following medications: -antiviral medicines for hepatitis, HIV or AIDS -certain  antibiotics like erythromycin and clarithromycin -certain medicines for fungal infections like ketoconazole and itraconazole -certain medicines for seizures like carbamazepine, phenobarbital, phenytoin -gemfibrozil -nefazodone -rifampin -St. John's wort This list may not describe all possible interactions. Give your health care provider a list of all the medicines, herbs, non-prescription drugs, or dietary  supplements you use. Also tell them if you smoke, drink alcohol, or use illegal drugs. Some items may interact with your medicine. What should I watch for while using this medicine? Your condition will be monitored carefully while you are receiving this medicine. You will need important blood work done while you are taking this medicine. This medicine can cause serious allergic reactions. To reduce your risk you will need to take other medicine(s) before treatment with this medicine. If you experience allergic reactions like skin rash, itching or hives, swelling of the face, lips, or tongue, tell your doctor or health care professional right away. In some cases, you may be given additional medicines to help with side effects. Follow all directions for their use. This drug may make you feel generally unwell. This is not uncommon, as chemotherapy can affect healthy cells as well as cancer cells. Report any side effects. Continue your course of treatment even though you feel ill unless your doctor tells you to stop. Call your doctor or health care professional for advice if you get a fever, chills or sore throat, or other symptoms of a cold or flu. Do not treat yourself. This drug decreases your body's ability to fight infections. Try to avoid being around people who are sick. This medicine may increase your risk to bruise or bleed. Call your doctor or health care professional if you notice any unusual bleeding. Be careful brushing and flossing your teeth or using a toothpick because you may get an infection or bleed more easily. If you have any dental work done, tell your dentist you are receiving this medicine. Avoid taking products that contain aspirin, acetaminophen, ibuprofen, naproxen, or ketoprofen unless instructed by your doctor. These medicines may hide a fever. Do not become pregnant while taking this medicine. Women should inform their doctor if they wish to become pregnant or think they might be  pregnant. There is a potential for serious side effects to an unborn child. Talk to your health care professional or pharmacist for more information. Do not breast-feed an infant while taking this medicine. Men are advised not to father a child while receiving this medicine. This product may contain alcohol. Ask your pharmacist or healthcare provider if this medicine contains alcohol. Be sure to tell all healthcare providers you are taking this medicine. Certain medicines, like metronidazole and disulfiram, can cause an unpleasant reaction when taken with alcohol. The reaction includes flushing, headache, nausea, vomiting, sweating, and increased thirst. The reaction can last from 30 minutes to several hours. What side effects may I notice from receiving this medicine? Side effects that you should report to your doctor or health care professional as soon as possible: -allergic reactions like skin rash, itching or hives, swelling of the face, lips, or tongue -breathing problems -changes in vision -fast, irregular heartbeat -high or low blood pressure -mouth sores -pain, tingling, numbness in the hands or feet -signs of decreased platelets or bleeding - bruising, pinpoint red spots on the skin, black, tarry stools, blood in the urine -signs of decreased red blood cells - unusually weak or tired, feeling faint or lightheaded, falls -signs of infection - fever or chills, cough, sore throat, pain or  difficulty passing urine -signs and symptoms of liver injury like dark yellow or brown urine; general ill feeling or flu-like symptoms; light-colored stools; loss of appetite; nausea; right upper belly pain; unusually weak or tired; yellowing of the eyes or skin -swelling of the ankles, feet, hands -unusually slow heartbeat Side effects that usually do not require medical attention (report to your doctor or health care professional if they continue or are bothersome): -diarrhea -hair loss -loss of  appetite -muscle or joint pain -nausea, vomiting -pain, redness, or irritation at site where injected -tiredness This list may not describe all possible side effects. Call your doctor for medical advice about side effects. You may report side effects to FDA at 1-800-FDA-1088. Where should I keep my medicine? This drug is given in a hospital or clinic and will not be stored at home. NOTE: This sheet is a summary. It may not cover all possible information. If you have questions about this medicine, talk to your doctor, pharmacist, or health care provider.  2019 Elsevier/Gold Standard (2017-03-30 13:14:55)

## 2018-09-09 NOTE — Progress Notes (Signed)
Called pt to introduce myself as her Arboriculturist and to discuss copay assistance.  Since pt doesn't speak English well I spoke to her daughter who gave me consent to apply in her behalf so I applied to the [facility] [place] but pt was denied because her household income exceeded the guidelines of their program so I enrolled her in [facility] program. She wasapproved for $25,000 for Herceptin for a year from1/31/20.Pt's responsibility will be as little as $5 per infusion.  Pt is also overqualified for the [employer].  Iwill giveher my card on 09/09/18 for any questions or concerns she may have in the future.

## 2018-09-12 ENCOUNTER — Ambulatory Visit: Payer: Federal, State, Local not specified - PPO

## 2018-09-19 ENCOUNTER — Inpatient Hospital Stay: Payer: Federal, State, Local not specified - PPO

## 2018-09-19 ENCOUNTER — Encounter: Payer: Self-pay | Admitting: Adult Health

## 2018-09-19 ENCOUNTER — Inpatient Hospital Stay: Payer: Federal, State, Local not specified - PPO | Attending: Hematology and Oncology

## 2018-09-19 ENCOUNTER — Telehealth: Payer: Self-pay | Admitting: Adult Health

## 2018-09-19 ENCOUNTER — Inpatient Hospital Stay (HOSPITAL_BASED_OUTPATIENT_CLINIC_OR_DEPARTMENT_OTHER): Payer: Federal, State, Local not specified - PPO | Admitting: Adult Health

## 2018-09-19 VITALS — BP 151/81 | HR 88 | Temp 98.5°F | Resp 18 | Ht 65.0 in | Wt 166.3 lb

## 2018-09-19 DIAGNOSIS — Z170 Estrogen receptor positive status [ER+]: Secondary | ICD-10-CM

## 2018-09-19 DIAGNOSIS — C50212 Malignant neoplasm of upper-inner quadrant of left female breast: Secondary | ICD-10-CM

## 2018-09-19 DIAGNOSIS — Z5112 Encounter for antineoplastic immunotherapy: Secondary | ICD-10-CM

## 2018-09-19 DIAGNOSIS — I10 Essential (primary) hypertension: Secondary | ICD-10-CM

## 2018-09-19 DIAGNOSIS — Z794 Long term (current) use of insulin: Secondary | ICD-10-CM | POA: Diagnosis not present

## 2018-09-19 DIAGNOSIS — R21 Rash and other nonspecific skin eruption: Secondary | ICD-10-CM | POA: Insufficient documentation

## 2018-09-19 DIAGNOSIS — R0602 Shortness of breath: Secondary | ICD-10-CM | POA: Diagnosis not present

## 2018-09-19 DIAGNOSIS — E1140 Type 2 diabetes mellitus with diabetic neuropathy, unspecified: Secondary | ICD-10-CM | POA: Insufficient documentation

## 2018-09-19 DIAGNOSIS — Z79899 Other long term (current) drug therapy: Secondary | ICD-10-CM

## 2018-09-19 DIAGNOSIS — K219 Gastro-esophageal reflux disease without esophagitis: Secondary | ICD-10-CM | POA: Insufficient documentation

## 2018-09-19 DIAGNOSIS — R197 Diarrhea, unspecified: Secondary | ICD-10-CM | POA: Diagnosis not present

## 2018-09-19 DIAGNOSIS — Z95828 Presence of other vascular implants and grafts: Secondary | ICD-10-CM

## 2018-09-19 DIAGNOSIS — D649 Anemia, unspecified: Secondary | ICD-10-CM | POA: Diagnosis not present

## 2018-09-19 DIAGNOSIS — E119 Type 2 diabetes mellitus without complications: Secondary | ICD-10-CM

## 2018-09-19 DIAGNOSIS — E785 Hyperlipidemia, unspecified: Secondary | ICD-10-CM | POA: Insufficient documentation

## 2018-09-19 LAB — CMP (CANCER CENTER ONLY)
ALT: 25 U/L (ref 0–44)
AST: 16 U/L (ref 15–41)
Albumin: 3.5 g/dL (ref 3.5–5.0)
Alkaline Phosphatase: 81 U/L (ref 38–126)
Anion gap: 9 (ref 5–15)
BUN: 12 mg/dL (ref 8–23)
CO2: 21 mmol/L — ABNORMAL LOW (ref 22–32)
Calcium: 9.2 mg/dL (ref 8.9–10.3)
Chloride: 107 mmol/L (ref 98–111)
Creatinine: 0.87 mg/dL (ref 0.44–1.00)
GFR, Est AFR Am: >60 mL/min (ref >60)
Glucose, Bld: 182 mg/dL — ABNORMAL HIGH (ref 70–99)
Potassium: 4.1 mmol/L (ref 3.5–5.1)
Sodium: 137 mmol/L (ref 135–145)
Total Bilirubin: 0.5 mg/dL (ref 0.3–1.2)
Total Protein: 6.8 g/dL (ref 6.5–8.1)

## 2018-09-19 LAB — CBC WITH DIFFERENTIAL (CANCER CENTER ONLY)
Abs Immature Granulocytes: 0.03 10*3/uL (ref 0.00–0.07)
Basophils Absolute: 0 10*3/uL (ref 0.0–0.1)
Basophils Relative: 0 %
Eosinophils Absolute: 0.1 10*3/uL (ref 0.0–0.5)
Eosinophils Relative: 2 %
HCT: 33.4 % — ABNORMAL LOW (ref 36.0–46.0)
HEMOGLOBIN: 11.1 g/dL — AB (ref 12.0–15.0)
Immature Granulocytes: 1 %
Lymphocytes Relative: 33 %
Lymphs Abs: 2 10*3/uL (ref 0.7–4.0)
MCH: 27.1 pg (ref 26.0–34.0)
MCHC: 33.2 g/dL (ref 30.0–36.0)
MCV: 81.5 fL (ref 80.0–100.0)
Monocytes Absolute: 0.5 10*3/uL (ref 0.1–1.0)
Monocytes Relative: 8 %
Neutro Abs: 3.5 10*3/uL (ref 1.7–7.7)
Neutrophils Relative %: 56 %
Platelet Count: 233 10*3/uL (ref 150–400)
RBC: 4.1 MIL/uL (ref 3.87–5.11)
RDW: 14.1 % (ref 11.5–15.5)
WBC Count: 6.1 10*3/uL (ref 4.0–10.5)
nRBC: 0 % (ref 0.0–0.2)

## 2018-09-19 MED ORDER — ACETAMINOPHEN 325 MG PO TABS
ORAL_TABLET | ORAL | Status: AC
  Filled 2018-09-19: qty 2

## 2018-09-19 MED ORDER — TRASTUZUMAB CHEMO 150 MG IV SOLR
150.0000 mg | Freq: Once | INTRAVENOUS | Status: AC
  Administered 2018-09-19: 150 mg via INTRAVENOUS
  Filled 2018-09-19: qty 7.14

## 2018-09-19 MED ORDER — FAMOTIDINE IN NACL 20-0.9 MG/50ML-% IV SOLN
INTRAVENOUS | Status: AC
  Filled 2018-09-19: qty 50

## 2018-09-19 MED ORDER — FAMOTIDINE IN NACL 20-0.9 MG/50ML-% IV SOLN
20.0000 mg | Freq: Once | INTRAVENOUS | Status: AC
  Administered 2018-09-19: 20 mg via INTRAVENOUS

## 2018-09-19 MED ORDER — ACETAMINOPHEN 325 MG PO TABS
650.0000 mg | ORAL_TABLET | Freq: Once | ORAL | Status: AC
  Administered 2018-09-19: 650 mg via ORAL

## 2018-09-19 MED ORDER — SODIUM CHLORIDE 0.9 % IV SOLN
20.0000 mg | Freq: Once | INTRAVENOUS | Status: AC
  Administered 2018-09-19: 20 mg via INTRAVENOUS
  Filled 2018-09-19: qty 2

## 2018-09-19 MED ORDER — SODIUM CHLORIDE 0.9% FLUSH
10.0000 mL | INTRAVENOUS | Status: DC | PRN
  Administered 2018-09-19: 10 mL
  Filled 2018-09-19: qty 10

## 2018-09-19 MED ORDER — HEPARIN SOD (PORK) LOCK FLUSH 100 UNIT/ML IV SOLN
500.0000 [IU] | Freq: Once | INTRAVENOUS | Status: AC | PRN
  Administered 2018-09-19: 500 [IU]
  Filled 2018-09-19: qty 5

## 2018-09-19 MED ORDER — SODIUM CHLORIDE 0.9% FLUSH
10.0000 mL | Freq: Once | INTRAVENOUS | Status: AC
  Administered 2018-09-19: 10 mL
  Filled 2018-09-19: qty 10

## 2018-09-19 MED ORDER — SODIUM CHLORIDE 0.9 % IV SOLN
80.0000 mg/m2 | Freq: Once | INTRAVENOUS | Status: AC
  Administered 2018-09-19: 150 mg via INTRAVENOUS
  Filled 2018-09-19: qty 25

## 2018-09-19 MED ORDER — DIPHENHYDRAMINE HCL 50 MG/ML IJ SOLN
50.0000 mg | Freq: Once | INTRAMUSCULAR | Status: AC
  Administered 2018-09-19: 50 mg via INTRAVENOUS

## 2018-09-19 MED ORDER — DIPHENHYDRAMINE HCL 50 MG/ML IJ SOLN
INTRAMUSCULAR | Status: AC
  Filled 2018-09-19: qty 1

## 2018-09-19 MED ORDER — SODIUM CHLORIDE 0.9 % IV SOLN
Freq: Once | INTRAVENOUS | Status: AC
  Administered 2018-09-19: 13:00:00 via INTRAVENOUS
  Filled 2018-09-19: qty 250

## 2018-09-19 MED ORDER — SODIUM CHLORIDE 0.9 % IV SOLN: 20.0000 mg | Freq: Once | INTRAVENOUS | Status: DC

## 2018-09-19 NOTE — Progress Notes (Signed)
Gloria Mckinney Follow up:    Gloria Jordan, MD Gloria Mckinney [address] [id number]   DIAGNOSIS: Mckinney Staging Malignant neoplasm of upper-inner quadrant of left breast in female, estrogen receptor positive ([place]) Staging form: Breast, AJCC 8th Edition - Pathologic: Stage IA (pT1b, pN0, cM0, G3, ER+, PR+, HER2+) - Signed by Gloria Phlegm, NP on 08/24/2018   SUMMARY OF ONCOLOGIC HISTORY:   Malignant neoplasm of upper-inner quadrant of left breast in female, estrogen receptor positive ([place])   07/18/2018 Initial Diagnosis    Screening detected left breast mass at 10 o'clock position 1.8 cm axilla negative, biopsy revealed grade 3 IDC ER 80%, PR 70%, Ki-67 40%, HER-2 +3+ by IHC with heterogeneity, T1c N0 stage Ia clinical stage    08/15/2018 Surgery    Left Lumpectomy: IDC grade 3, 1.9 cm, Posterior margin positive (Muscle) due to a transected satellite nodule 0.5 cm, 0/3 LN Neg, ER 80%, PR 70%, Ki-67 40%, HER-2 +3+ by IHC with heterogeneity T1cN0 Stage 1A    08/24/2018 Mckinney Staging    Staging form: Breast, AJCC 8th Edition - Pathologic: Stage IA (pT1b, pN0, cM0, G3, ER+, PR+, HER2+) - Signed by Gloria Phlegm, NP on 08/24/2018    09/09/2018 -  Chemotherapy    The patient had trastuzumab (HERCEPTIN) 300 mg in sodium chloride 0.9 % 250 mL chemo infusion, 294 mg, Intravenous,  Once, 1 of 16 cycles Administration: 300 mg (09/09/2018) PACLitaxel (TAXOL) 150 mg in sodium chloride 0.9 % 250 mL chemo infusion (</= 4m/m2), 80 mg/m2 = 150 mg, Intravenous,  Once, 1 of 3 cycles Administration: 150 mg (09/09/2018)  for chemotherapy treatment.      CURRENT THERAPY: Taxol/Herceptin  INTERVAL HISTORY: Gloria Mckinney 73y.o. female returns for evaluation prior to receiving her second week of Taxol and Herceptin.  She is doing well today.  She had no issues with the treatment other than mild diarrhea that has since resolved.  She  wants more information on when she should be meeting with radiation oncology and who I would recommend.  They note that they haven't met a radiation oncologist.     Patient Active Problem List   Diagnosis Date Noted  . Port-A-Cath in place 09/19/2018  . Malignant neoplasm of upper-inner quadrant of left breast in female, estrogen receptor positive ([place] 07/27/2018  . Dyspnea on exertion 10/21/2016  . Type 2 diabetes mellitus without complication, without long-term current use of insulin ([place] 10/21/2016  . Dyslipidemia 10/21/2016    is allergic to penicillins; cephalexin; and statins.  MEDICAL HISTORY: Past Medical History:  Diagnosis Date  . GERD (gastroesophageal reflux disease)   . Hypertension   . Mixed hyperlipidemia   . OA (osteoarthritis)    knee right  . Peripheral neuropathy   . PVD (peripheral vascular disease) ([facility]   . Type 2 diabetes mellitus treated with insulin ([facility]    endocrinologist--- dr dProvidence Crosbypatel    SURGICAL HISTORY: Past Surgical History:  Procedure Laterality Date  . BREAST BIOPSY Bilateral 2000   benign  . BREAST LUMPECTOMY WITH RADIOACTIVE SEED AND SENTINEL LYMPH NODE BIOPSY Left 08/15/2018   Procedure: LEFT BREAST LUMPECTOMY WITH RADIOACTIVE SEED AND LEFT AXILLARY DEEP SENTINEL LYMPH NODE BIOPSY INJECT BLUE DYE LEFT BREAST;  Surgeon: IFanny Skates MD;  [facility]  Service: General;  Laterality: Left;  . PORTACATH PLACEMENT Right 08/15/2018   Procedure: INSERTION PORT-A-CATH WITH ULTRASOUND;  Surgeon: IFanny Skates MD;  Location[facility];  Service: General;  Laterality: Right;  . SHOULDER ARTHROSCOPY Right 06-16-2002   dr graves  . SHOULDER OPEN ROTATOR CUFF REPAIR Right 07-28-2000    dr Berenice Primas  . VAGINAL HYSTERECTOMY  1992   with BSO  . WRIST SURGERY  2007    SOCIAL HISTORY: Social History   Socioeconomic History  . Marital status: Married    Spouse name: Not on file  . Number of children: 4   . Years of education: Not on file  . Highest education level: Not on file  Occupational History  . Not on file  Social Needs  . Financial resource strain: Not on file  . Food insecurity:    Worry: Not on file    Inability: Not on file  . Transportation needs:    Medical: Not on file    Non-medical: Not on file  Tobacco Use  . Smoking status: Never Smoker  . Smokeless tobacco: Never Used  Substance and Sexual Activity  . Alcohol use: Never    Frequency: Never  . Drug use: Never  . Sexual activity: Not on file  Lifestyle  . Physical activity:    Days per week: Not on file    Minutes per session: Not on file  . Stress: Not on file  Relationships  . Social connections:    Talks on phone: Not on file    Gets together: Not on file    Attends religious service: Not on file    Active member of club or organization: Not on file    Attends meetings of clubs or organizations: Not on file    Relationship status: Not on file  . Intimate partner violence:    Fear of current or ex partner: Not on file    Emotionally abused: Not on file    Physically abused: Not on file    Forced sexual activity: Not on file  Other Topics Concern  . Not on file  Social History Narrative   Lives with husband and son.      FAMILY HISTORY: Family History  Problem Relation Age of Onset  . Hypertension Mother   . CVA Mother   . Heart disease Mother   . Asthma Father   . Hypertension Sister   . Diabetes Sister   . Heart disease Brother   . Hypertension Sister   . Diabetes Sister   . Hypertension Sister   . Diabetes Sister   . Breast Mckinney Neg Hx     Review of Systems  Constitutional: Negative for appetite change, chills, fatigue, fever and unexpected weight change.  HENT:   Negative for hearing loss, lump/mass, mouth sores and trouble swallowing.   Eyes: Negative for eye problems and icterus.  Respiratory: Negative for chest tightness, cough and shortness of breath.   Cardiovascular:  Negative for chest pain, leg swelling and palpitations.  Gastrointestinal: Negative for abdominal distention, abdominal pain, constipation, diarrhea, nausea and vomiting.  Endocrine: Negative for hot flashes.  Musculoskeletal: Negative for arthralgias.  Skin: Negative for itching and rash.  Neurological: Negative for dizziness, extremity weakness, headaches and numbness.  Hematological: Negative for adenopathy. Does not bruise/bleed easily.  Psychiatric/Behavioral: Negative for depression. The patient is not nervous/anxious.       PHYSICAL EXAMINATION  ECOG PERFORMANCE STATUS: 1 - Symptomatic but completely ambulatory  Vitals:   09/19/18 1127  BP: (!) 151/81  Pulse: 88  Resp: 18  Temp: 98.5 F (36.9 C)  SpO2: 100%    Physical  Exam Constitutional:      General: She is not in acute distress.    Appearance: Normal appearance.  HENT:     Head: Normocephalic and atraumatic.     Mouth/Throat:     Mouth: Mucous membranes are moist.     Pharynx: Oropharynx is clear. No oropharyngeal exudate.  Eyes:     General: No scleral icterus.    Pupils: Pupils are equal, round, and reactive to light.  Neck:     Musculoskeletal: Neck supple.  Cardiovascular:     Rate and Rhythm: Normal rate and regular rhythm.     Pulses: Normal pulses.     Heart sounds: Normal heart sounds.  Pulmonary:     Effort: Pulmonary effort is normal.     Breath sounds: Normal breath sounds.  Abdominal:     General: Abdomen is flat. Bowel sounds are normal. There is no distension.     Palpations: Abdomen is soft.     Tenderness: There is no abdominal tenderness.  Musculoskeletal:        General: No swelling.  Lymphadenopathy:     Cervical: No cervical adenopathy.  Skin:    General: Skin is warm and dry.     Capillary Refill: Capillary refill takes less than 2 seconds.     Findings: No rash.  Neurological:     General: No focal deficit present.     Mental Status: She is alert.  Psychiatric:        Mood  and Affect: Mood normal.        Behavior: Behavior normal.     LABORATORY DATA:  CBC    Component Value Date/Time   WBC 6.1 09/19/2018 1104   WBC 6.2 07/15/2018 1003   RBC 4.10 09/19/2018 1104   HGB 11.1 (L) 09/19/2018 1104   HCT 33.4 (L) 09/19/2018 1104   PLT 233 09/19/2018 1104   MCV 81.5 09/19/2018 1104   MCH 27.1 09/19/2018 1104   MCHC 33.2 09/19/2018 1104   RDW 14.1 09/19/2018 1104   LYMPHSABS 2.0 09/19/2018 1104   MONOABS 0.5 09/19/2018 1104   EOSABS 0.1 09/19/2018 1104   BASOSABS 0.0 09/19/2018 1104    CMP     Component Value Date/Time   NA 137 09/19/2018 1104   K 4.1 09/19/2018 1104   CL 107 09/19/2018 1104   CO2 21 (L) 09/19/2018 1104   GLUCOSE 182 (H) 09/19/2018 1104   BUN 12 09/19/2018 1104   CREATININE 0.87 09/19/2018 1104   CALCIUM 9.2 09/19/2018 1104   PROT 6.8 09/19/2018 1104   ALBUMIN 3.5 09/19/2018 1104   AST 16 09/19/2018 1104   ALT 25 09/19/2018 1104   ALKPHOS 81 09/19/2018 1104   BILITOT 0.5 09/19/2018 1104   GFRNONAA >60 09/19/2018 1104   GFRAA >60 09/19/2018 1104     ASSESSMENT and PLAN:   Malignant neoplasm of upper-inner quadrant of left breast in female, estrogen receptor positive ([place]) 08/15/18: Left Lumpectomy: IDC grade 3, 1.9 cm, Posterior margin positive (Muscle) due to a transected satellite nodule 0.5 cm, 0/3 LN Neg, ER 80%, PR 70%, Ki-67 40%, HER-2 +3+ by IHC with heterogeneity T1cN0 Stage 1A  Pathology counseling: I discussed the final pathology report of the patient provided  a copy of this report. I discussed the margins as well as lymph node surgeries. We also discussed the final staging along with previously performed ER/PR and HER-2/neu testing.  Posterior Margin: based on discussions with surgery, it is muscle and hence no further surgery is  possible.  Plan:  1. Adjuvant Taxol Herceptin weekly x12 followed by Herceptin maintenance for 1 year 2.  Adjuvant radiation therapy 3.  Followed by adjuvant antiestrogen  therapy   _________________________________________________________________________________________ Echo 08/23/2018 shows EF of 55-60%  Week 2 Taxol/Herceptin Tolerating well Toxicities 1. Diarrhea-resolved  Let patient and her daughter know that we will schedule with radiation oncology when we get closer to the completion date of her chemotherapy.  Likely around week 8 or 10 of treatment.    She will return in 1 week for labs and treatment, and will see myself or Dr. Lindi Adie with every other treatment (in 2 weeks).  All questions were answered. The patient knows to call the clinic with any problems, questions or concerns. We can certainly see the patient much sooner if necessary.  A total of (20) minutes of face-to-face time was spent with this patient with greater than 50% of that time in counseling and care-coordination.  This note was electronically signed. Scot Dock, NP 09/19/2018

## 2018-09-19 NOTE — Patient Instructions (Signed)
[facility] Discharge Instructions for Patients Receiving Chemotherapy  Today you received the following chemotherapy agents:  Herceptin and Taxol.  To help prevent nausea and vomiting after your treatment, we encourage you to take your nausea medication as directed.   If you develop nausea and vomiting that is not controlled by your nausea medication, call the clinic.   BELOW ARE SYMPTOMS THAT SHOULD BE REPORTED IMMEDIATELY:  *FEVER GREATER THAN 100.5 F  *CHILLS WITH OR WITHOUT FEVER  NAUSEA AND VOMITING THAT IS NOT CONTROLLED WITH YOUR NAUSEA MEDICATION  *UNUSUAL SHORTNESS OF BREATH  *UNUSUAL BRUISING OR BLEEDING  TENDERNESS IN MOUTH AND THROAT WITH OR WITHOUT PRESENCE OF ULCERS  *URINARY PROBLEMS  *BOWEL PROBLEMS  UNUSUAL RASH Items with * indicate a potential emergency and should be followed up as soon as possible.  Feel free to call the clinic should you have any questions or concerns. The clinic phone number is [phone].  Please show the CHEMO ALERT CARD at check-in to the Emergency Department and triage nurse.   

## 2018-09-19 NOTE — Assessment & Plan Note (Addendum)
08/15/18: Left Lumpectomy: IDC grade 3, 1.9 cm, Posterior margin positive (Muscle) due to a transected satellite nodule 0.5 cm, 0/3 LN Neg, ER 80%, PR 70%, Ki-67 40%, HER-2 +3+ by IHC with heterogeneity T1cN0 Stage 1A  Pathology counseling: I discussed the final pathology report of the patient provided  a copy of this report. I discussed the margins as well as lymph node surgeries. We also discussed the final staging along with previously performed ER/PR and HER-2/neu testing.  Posterior Margin: based on discussions with surgery, it is muscle and hence no further surgery is possible.  Plan:  1. Adjuvant Taxol Herceptin weekly x12 followed by Herceptin maintenance for 1 year 2.  Adjuvant radiation therapy 3.  Followed by adjuvant antiestrogen therapy   _________________________________________________________________________________________ Echo 08/23/2018 shows EF of 55-60%  Week 2 Taxol/Herceptin Tolerating well Toxicities 1. Diarrhea-resolved  Let patient and her daughter know that we will schedule with radiation oncology when we get closer to the completion date of her chemotherapy.  Likely around week 8 or 10 of treatment.    She will return in 1 week for labs and treatment, and will see myself or Dr. Lindi Adie with every other treatment (in 2 weeks).

## 2018-09-19 NOTE — Telephone Encounter (Signed)
No los °

## 2018-09-26 ENCOUNTER — Inpatient Hospital Stay: Payer: Federal, State, Local not specified - PPO

## 2018-09-26 ENCOUNTER — Other Ambulatory Visit: Payer: Self-pay | Admitting: Medical

## 2018-09-26 ENCOUNTER — Inpatient Hospital Stay (HOSPITAL_BASED_OUTPATIENT_CLINIC_OR_DEPARTMENT_OTHER): Payer: Federal, State, Local not specified - PPO | Admitting: Medical

## 2018-09-26 VITALS — BP 153/75 | HR 86 | Temp 98.3°F | Resp 16

## 2018-09-26 DIAGNOSIS — Z95828 Presence of other vascular implants and grafts: Secondary | ICD-10-CM

## 2018-09-26 DIAGNOSIS — C50212 Malignant neoplasm of upper-inner quadrant of left female breast: Secondary | ICD-10-CM | POA: Diagnosis not present

## 2018-09-26 DIAGNOSIS — Z170 Estrogen receptor positive status [ER+]: Secondary | ICD-10-CM

## 2018-09-26 DIAGNOSIS — G4700 Insomnia, unspecified: Secondary | ICD-10-CM

## 2018-09-26 LAB — CBC WITH DIFFERENTIAL (CANCER CENTER ONLY)
Abs Immature Granulocytes: 0.03 10*3/uL (ref 0.00–0.07)
Basophils Absolute: 0 10*3/uL (ref 0.0–0.1)
Basophils Relative: 1 %
EOS ABS: 0.1 10*3/uL (ref 0.0–0.5)
Eosinophils Relative: 2 %
HCT: 32.8 % — ABNORMAL LOW (ref 36.0–46.0)
Hemoglobin: 10.8 g/dL — ABNORMAL LOW (ref 12.0–15.0)
Immature Granulocytes: 1 %
LYMPHS ABS: 2.2 10*3/uL (ref 0.7–4.0)
Lymphocytes Relative: 42 %
MCH: 26.7 pg (ref 26.0–34.0)
MCHC: 32.9 g/dL (ref 30.0–36.0)
MCV: 81.2 fL (ref 80.0–100.0)
Monocytes Absolute: 0.3 10*3/uL (ref 0.1–1.0)
Monocytes Relative: 7 %
Neutro Abs: 2.5 10*3/uL (ref 1.7–7.7)
Neutrophils Relative %: 47 %
Platelet Count: 239 10*3/uL (ref 150–400)
RBC: 4.04 MIL/uL (ref 3.87–5.11)
RDW: 13.7 % (ref 11.5–15.5)
WBC Count: 5.2 10*3/uL (ref 4.0–10.5)
nRBC: 0 % (ref 0.0–0.2)

## 2018-09-26 LAB — CMP (CANCER CENTER ONLY)
ALT: 26 U/L (ref 0–44)
AST: 16 U/L (ref 15–41)
Albumin: 3.6 g/dL (ref 3.5–5.0)
Alkaline Phosphatase: 77 U/L (ref 38–126)
Anion gap: 11 (ref 5–15)
BUN: 10 mg/dL (ref 8–23)
CO2: 22 mmol/L (ref 22–32)
Calcium: 9.5 mg/dL (ref 8.9–10.3)
Chloride: 105 mmol/L (ref 98–111)
Creatinine: 0.78 mg/dL (ref 0.44–1.00)
GFR, Est AFR Am: >60 mL/min (ref >60)
GFR, Estimated: >60 mL/min (ref >60)
Glucose, Bld: 107 mg/dL — ABNORMAL HIGH (ref 70–99)
Potassium: 4.3 mmol/L (ref 3.5–5.1)
SODIUM: 138 mmol/L (ref 135–145)
Total Bilirubin: 0.2 mg/dL — ABNORMAL LOW (ref 0.3–1.2)
Total Protein: 6.8 g/dL (ref 6.5–8.1)

## 2018-09-26 MED ORDER — TRASTUZUMAB CHEMO 150 MG IV SOLR
150.0000 mg | Freq: Once | INTRAVENOUS | Status: AC
  Administered 2018-09-26: 150 mg via INTRAVENOUS
  Filled 2018-09-26: qty 7.14

## 2018-09-26 MED ORDER — ACETAMINOPHEN 325 MG PO TABS
650.0000 mg | ORAL_TABLET | Freq: Once | ORAL | Status: AC
  Administered 2018-09-26: 650 mg via ORAL

## 2018-09-26 MED ORDER — SODIUM CHLORIDE 0.9% FLUSH
10.0000 mL | INTRAVENOUS | Status: DC | PRN
  Administered 2018-09-26: 10 mL
  Filled 2018-09-26: qty 10

## 2018-09-26 MED ORDER — SODIUM CHLORIDE 0.9% FLUSH
10.0000 mL | Freq: Once | INTRAVENOUS | Status: AC
  Administered 2018-09-26: 10 mL
  Filled 2018-09-26: qty 10

## 2018-09-26 MED ORDER — SODIUM CHLORIDE 0.9 % IV SOLN
20.0000 mg | Freq: Once | INTRAVENOUS | Status: AC
  Administered 2018-09-26: 20 mg via INTRAVENOUS
  Filled 2018-09-26: qty 20

## 2018-09-26 MED ORDER — SODIUM CHLORIDE 0.9 % IV SOLN
80.0000 mg/m2 | Freq: Once | INTRAVENOUS | Status: AC
  Administered 2018-09-26: 150 mg via INTRAVENOUS
  Filled 2018-09-26: qty 25

## 2018-09-26 MED ORDER — FAMOTIDINE IN NACL 20-0.9 MG/50ML-% IV SOLN
INTRAVENOUS | Status: AC
  Filled 2018-09-26: qty 50

## 2018-09-26 MED ORDER — MIRTAZAPINE 7.5 MG PO TABS: 7.5000 mg | ORAL_TABLET | Freq: Every day | ORAL | 0 refills | Status: DC

## 2018-09-26 MED ORDER — DIPHENHYDRAMINE HCL 50 MG/ML IJ SOLN
INTRAMUSCULAR | Status: AC
  Filled 2018-09-26: qty 1

## 2018-09-26 MED ORDER — SODIUM CHLORIDE 0.9 % IV SOLN
Freq: Once | INTRAVENOUS | Status: AC
  Administered 2018-09-26: 13:00:00 via INTRAVENOUS
  Filled 2018-09-26: qty 250

## 2018-09-26 MED ORDER — HEPARIN SOD (PORK) LOCK FLUSH 100 UNIT/ML IV SOLN
500.0000 [IU] | Freq: Once | INTRAVENOUS | Status: AC | PRN
  Administered 2018-09-26: 500 [IU]
  Filled 2018-09-26: qty 5

## 2018-09-26 MED ORDER — ACETAMINOPHEN 325 MG PO TABS
ORAL_TABLET | ORAL | Status: AC
  Filled 2018-09-26: qty 2

## 2018-09-26 MED ORDER — FAMOTIDINE IN NACL 20-0.9 MG/50ML-% IV SOLN
20.0000 mg | Freq: Once | INTRAVENOUS | Status: AC
  Administered 2018-09-26: 20 mg via INTRAVENOUS

## 2018-09-26 MED ORDER — DIPHENHYDRAMINE HCL 50 MG/ML IJ SOLN
50.0000 mg | Freq: Once | INTRAMUSCULAR | Status: AC
  Administered 2018-09-26: 50 mg via INTRAVENOUS

## 2018-09-26 NOTE — Progress Notes (Signed)
The patient was seen briefly in the infusion room today.  She has been having insomnia since last week.  She has not been taking anything for her insomnia.  A prescription for Remeron 7.5 mg p.o. nightly was sent to the patient's pharmacy.  Sandi Mealy, MHS, PA-C Physician Assistant

## 2018-09-26 NOTE — Patient Instructions (Signed)
[facility] Discharge Instructions for Patients Receiving Chemotherapy  Today you received the following chemotherapy agents:  Herceptin and Taxol.  To help prevent nausea and vomiting after your treatment, we encourage you to take your nausea medication as directed.   If you develop nausea and vomiting that is not controlled by your nausea medication, call the clinic.   BELOW ARE SYMPTOMS THAT SHOULD BE REPORTED IMMEDIATELY:  *FEVER GREATER THAN 100.5 F  *CHILLS WITH OR WITHOUT FEVER  NAUSEA AND VOMITING THAT IS NOT CONTROLLED WITH YOUR NAUSEA MEDICATION  *UNUSUAL SHORTNESS OF BREATH  *UNUSUAL BRUISING OR BLEEDING  TENDERNESS IN MOUTH AND THROAT WITH OR WITHOUT PRESENCE OF ULCERS  *URINARY PROBLEMS  *BOWEL PROBLEMS  UNUSUAL RASH Items with * indicate a potential emergency and should be followed up as soon as possible.  Feel free to call the clinic should you have any questions or concerns. The clinic phone number is [phone].  Please show the CHEMO ALERT CARD at check-in to the Emergency Department and triage nurse.   

## 2018-09-26 NOTE — Progress Notes (Signed)
Patient verbalized increased insomnia since beginning chemo treatments. Denies napping during the day or any other changes in activity that would potentially increase the incidence of insomnia. Sandi Mealy, PA-C came to infusion room to see patient.

## 2018-10-03 ENCOUNTER — Inpatient Hospital Stay (HOSPITAL_BASED_OUTPATIENT_CLINIC_OR_DEPARTMENT_OTHER): Payer: Federal, State, Local not specified - PPO | Admitting: Hematology and Oncology

## 2018-10-03 ENCOUNTER — Inpatient Hospital Stay: Payer: Federal, State, Local not specified - PPO

## 2018-10-03 DIAGNOSIS — Z95828 Presence of other vascular implants and grafts: Secondary | ICD-10-CM

## 2018-10-03 DIAGNOSIS — C50212 Malignant neoplasm of upper-inner quadrant of left female breast: Secondary | ICD-10-CM

## 2018-10-03 DIAGNOSIS — Z79899 Other long term (current) drug therapy: Secondary | ICD-10-CM

## 2018-10-03 DIAGNOSIS — K219 Gastro-esophageal reflux disease without esophagitis: Secondary | ICD-10-CM

## 2018-10-03 DIAGNOSIS — R21 Rash and other nonspecific skin eruption: Secondary | ICD-10-CM | POA: Diagnosis not present

## 2018-10-03 DIAGNOSIS — E785 Hyperlipidemia, unspecified: Secondary | ICD-10-CM

## 2018-10-03 DIAGNOSIS — Z170 Estrogen receptor positive status [ER+]: Secondary | ICD-10-CM

## 2018-10-03 DIAGNOSIS — Z5112 Encounter for antineoplastic immunotherapy: Secondary | ICD-10-CM | POA: Diagnosis not present

## 2018-10-03 DIAGNOSIS — I10 Essential (primary) hypertension: Secondary | ICD-10-CM

## 2018-10-03 DIAGNOSIS — R197 Diarrhea, unspecified: Secondary | ICD-10-CM

## 2018-10-03 DIAGNOSIS — Z794 Long term (current) use of insulin: Secondary | ICD-10-CM

## 2018-10-03 DIAGNOSIS — R0602 Shortness of breath: Secondary | ICD-10-CM

## 2018-10-03 DIAGNOSIS — E1140 Type 2 diabetes mellitus with diabetic neuropathy, unspecified: Secondary | ICD-10-CM

## 2018-10-03 DIAGNOSIS — D649 Anemia, unspecified: Secondary | ICD-10-CM

## 2018-10-03 LAB — CBC WITH DIFFERENTIAL (CANCER CENTER ONLY)
Abs Immature Granulocytes: 0.06 10*3/uL (ref 0.00–0.07)
Basophils Absolute: 0 10*3/uL (ref 0.0–0.1)
Basophils Relative: 1 %
EOS ABS: 0.2 10*3/uL (ref 0.0–0.5)
EOS PCT: 4 %
HCT: 34 % — ABNORMAL LOW (ref 36.0–46.0)
Hemoglobin: 10.9 g/dL — ABNORMAL LOW (ref 12.0–15.0)
Immature Granulocytes: 1 %
Lymphocytes Relative: 35 %
Lymphs Abs: 1.8 10*3/uL (ref 0.7–4.0)
MCH: 26.6 pg (ref 26.0–34.0)
MCHC: 32.1 g/dL (ref 30.0–36.0)
MCV: 82.9 fL (ref 80.0–100.0)
Monocytes Absolute: 0.4 10*3/uL (ref 0.1–1.0)
Monocytes Relative: 8 %
Neutro Abs: 2.7 10*3/uL (ref 1.7–7.7)
Neutrophils Relative %: 51 %
Platelet Count: 235 10*3/uL (ref 150–400)
RBC: 4.1 MIL/uL (ref 3.87–5.11)
RDW: 13.9 % (ref 11.5–15.5)
WBC Count: 5.2 10*3/uL (ref 4.0–10.5)
nRBC: 0 % (ref 0.0–0.2)

## 2018-10-03 LAB — CMP (CANCER CENTER ONLY)
ALT: 25 U/L (ref 0–44)
ANION GAP: 11 (ref 5–15)
AST: 15 U/L (ref 15–41)
Albumin: 3.6 g/dL (ref 3.5–5.0)
Alkaline Phosphatase: 79 U/L (ref 38–126)
BUN: 11 mg/dL (ref 8–23)
CO2: 23 mmol/L (ref 22–32)
Calcium: 9.2 mg/dL (ref 8.9–10.3)
Chloride: 104 mmol/L (ref 98–111)
Creatinine: 0.86 mg/dL (ref 0.44–1.00)
GFR, Est AFR Am: >60 mL/min (ref >60)
GFR, Estimated: >60 mL/min (ref >60)
Glucose, Bld: 183 mg/dL — ABNORMAL HIGH (ref 70–99)
Potassium: 4 mmol/L (ref 3.5–5.1)
Sodium: 138 mmol/L (ref 135–145)
Total Bilirubin: 0.3 mg/dL (ref 0.3–1.2)
Total Protein: 6.7 g/dL (ref 6.5–8.1)

## 2018-10-03 MED ORDER — SODIUM CHLORIDE 0.9 % IV SOLN
20.0000 mg | Freq: Once | INTRAVENOUS | Status: AC
  Administered 2018-10-03: 20 mg via INTRAVENOUS
  Filled 2018-10-03: qty 20

## 2018-10-03 MED ORDER — SODIUM CHLORIDE 0.9 % IV SOLN
80.0000 mg/m2 | Freq: Once | INTRAVENOUS | Status: AC
  Administered 2018-10-03: 150 mg via INTRAVENOUS
  Filled 2018-10-03: qty 25

## 2018-10-03 MED ORDER — SODIUM CHLORIDE 0.9 % IV SOLN
Freq: Once | INTRAVENOUS | Status: AC
  Administered 2018-10-03: 11:00:00 via INTRAVENOUS
  Filled 2018-10-03: qty 250

## 2018-10-03 MED ORDER — TRASTUZUMAB CHEMO 150 MG IV SOLR
150.0000 mg | Freq: Once | INTRAVENOUS | Status: AC
  Administered 2018-10-03: 150 mg via INTRAVENOUS
  Filled 2018-10-03: qty 7.14

## 2018-10-03 MED ORDER — FAMOTIDINE IN NACL 20-0.9 MG/50ML-% IV SOLN: 20.0000 mg | Freq: Once | INTRAVENOUS | Status: DC

## 2018-10-03 MED ORDER — SODIUM CHLORIDE 0.9% FLUSH
10.0000 mL | Freq: Once | INTRAVENOUS | Status: AC
  Administered 2018-10-03: 10 mL
  Filled 2018-10-03: qty 10

## 2018-10-03 MED ORDER — SODIUM CHLORIDE 0.9% FLUSH
10.0000 mL | INTRAVENOUS | Status: DC | PRN
  Administered 2018-10-03: 10 mL
  Filled 2018-10-03: qty 10

## 2018-10-03 MED ORDER — SODIUM CHLORIDE 0.9 % IV SOLN
20.0000 mg | Freq: Once | INTRAVENOUS | Status: AC
  Administered 2018-10-03: 20 mg via INTRAVENOUS
  Filled 2018-10-03: qty 2

## 2018-10-03 MED ORDER — HEPARIN SOD (PORK) LOCK FLUSH 100 UNIT/ML IV SOLN
500.0000 [IU] | Freq: Once | INTRAVENOUS | Status: AC | PRN
  Administered 2018-10-03: 500 [IU]
  Filled 2018-10-03: qty 5

## 2018-10-03 MED ORDER — DIPHENHYDRAMINE HCL 50 MG/ML IJ SOLN
50.0000 mg | Freq: Once | INTRAMUSCULAR | Status: AC
  Administered 2018-10-03: 50 mg via INTRAVENOUS

## 2018-10-03 MED ORDER — DIPHENHYDRAMINE HCL 50 MG/ML IJ SOLN
INTRAMUSCULAR | Status: AC
  Filled 2018-10-03: qty 1

## 2018-10-03 MED ORDER — ACETAMINOPHEN 325 MG PO TABS
650.0000 mg | ORAL_TABLET | Freq: Once | ORAL | Status: AC
  Administered 2018-10-03: 650 mg via ORAL

## 2018-10-03 MED ORDER — ACETAMINOPHEN 325 MG PO TABS
ORAL_TABLET | ORAL | Status: AC
  Filled 2018-10-03: qty 2

## 2018-10-03 NOTE — Assessment & Plan Note (Addendum)
08/15/18: Left Lumpectomy: IDC grade 3, 1.9 cm, Posterior margin positive (Muscle) due to a transected satellite nodule 0.5 cm, 0/3 LN Neg, ER 80%, PR 70%, Ki-67 40%, HER-2 +3+ by IHC with heterogeneity T1cN0 Stage 1A  Posterior Margin: based on discussions with surgery, it is muscle and hence no further surgery is possible.  Plan:  1. Adjuvant Taxol Herceptin weekly x12 followed by Herceptin maintenance for 1 year 2.Adjuvant radiation therapy 3.Followed by adjuvant antiestrogen therapy --------------------------------------------------------------------------------------------------------------------------------------------------------- Current treatment: Cycle 4 Taxol Herceptin Chemo toxicities:  We discussed the importance of icing to decrease neuropathy. Return to clinic weekly for chemo and every other week for follow-up with me.

## 2018-10-03 NOTE — Progress Notes (Signed)
Patient Care Team: [name], MD as PCP - General (Family Medicine) Nicholas Lose, MD as Consulting Physician (Hematology and Oncology) Delice Bison Charlestine Massed, NP as Nurse Practitioner (Hematology and Oncology) Fanny Skates, MD as Consulting Physician (General Surgery)  DIAGNOSIS:  Encounter Diagnosis  Name Primary?  . Malignant neoplasm of upper-inner quadrant of left breast in female, estrogen receptor positive ([place])     SUMMARY OF ONCOLOGIC HISTORY:   Malignant neoplasm of upper-inner quadrant of left breast in female, estrogen receptor positive ([place])   07/18/2018 Initial Diagnosis    Screening detected left breast mass at 10 o'clock position 1.8 cm axilla negative, biopsy revealed grade 3 IDC ER 80%, PR 70%, Ki-67 40%, HER-2 +3+ by IHC with heterogeneity, T1c N0 stage Ia clinical stage    08/15/2018 Surgery    Left Lumpectomy: IDC grade 3, 1.9 cm, Posterior margin positive (Muscle) due to a transected satellite nodule 0.5 cm, 0/3 LN Neg, ER 80%, PR 70%, Ki-67 40%, HER-2 +3+ by IHC with heterogeneity T1cN0 Stage 1A    08/24/2018 Cancer Staging    Staging form: Breast, AJCC 8th Edition - Pathologic: Stage IA (pT1b, pN0, cM0, G3, ER+, PR+, HER2+) - Signed by Gardenia Phlegm, NP on 08/24/2018    09/09/2018 -  Chemotherapy    The patient had trastuzumab (HERCEPTIN) 300 mg in sodium chloride 0.9 % 250 mL chemo infusion, 294 mg, Intravenous,  Once, 1 of 16 cycles Administration: 300 mg (09/09/2018), 150 mg (09/19/2018), 150 mg (09/26/2018) PACLitaxel (TAXOL) 150 mg in sodium chloride 0.9 % 250 mL chemo infusion (</= 87m/m2), 80 mg/m2 = 150 mg, Intravenous,  Once, 1 of 3 cycles Administration: 150 mg (09/09/2018), 150 mg (09/19/2018), 150 mg (09/26/2018)  for chemotherapy treatment.      CHIEF COMPLIANT: Cycle 4 Taxol Herceptin  INTERVAL HISTORY: Gloria Mckinney a 73year old with above-mentioned history of left breast cancer treated with lumpectomy and is  now on adjuvant chemotherapy with Taxol Herceptin.  Gloria Mckinney is tolerating the treatment fairly well.  Her biggest complaint is hair loss as well as a rash on her cheeks.  It seems to get worse after the treatment gets better over time.  REVIEW OF SYSTEMS:   Constitutional: Denies fevers, chills or abnormal weight loss, complains of hair loss Eyes: Denies blurriness of vision Ears, nose, mouth, throat, and face: Denies mucositis or sore throat Respiratory: Denies cough, dyspnea or wheezes Cardiovascular: Denies palpitation, chest discomfort Gastrointestinal:  Denies nausea, heartburn or change in bowel habits Skin: Skin rash on bilateral cheeks Lymphatics: Denies new lymphadenopathy or easy bruising Neurological:Denies numbness, tingling or new weaknesses Behavioral/Psych: Mood is stable, no new changes  Extremities: No lower extremity edema All other systems were reviewed with the patient and are negative.  I have reviewed the past medical history, past surgical history, social history and family history with the patient and they are unchanged from previous note.  ALLERGIES:  is allergic to penicillins; cephalexin; and statins.  MEDICATIONS:  Current Outpatient Medications  Medication Sig Dispense Refill  . aspirin EC 81 MG tablet Take 81 mg by mouth daily.    . Calcium Carb-Cholecalciferol (CALCIUM + VITAMIN D3 PO) Take 1 tablet by mouth daily.    . Empagliflozin-metFORMIN HCl (SYNJARDY) 12.12-998 MG TABS Take 1 tablet by mouth 2 (two) times daily.    .Marland KitchenEXFORGE 5-320 MG tablet Take 1 tablet by mouth daily.    . ferrous sulfate 325 (65 FE) MG tablet Take 325 mg by mouth daily with  breakfast.    . hydrochlorothiazide (HYDRODIURIL) 25 MG tablet Take 25 mg by mouth daily.    Marland Kitchen HYDROcodone-acetaminophen (NORCO) 5-325 MG tablet Take 1-2 tablets by mouth every 6 (six) hours as needed for moderate pain or severe pain. 20 tablet 0  . Insulin Glargine, 1 Unit Dial, (TOUJEO SOLOSTAR) 300 UNIT/ML SOPN  Inject 50 Units into the skin every evening.    . lidocaine-prilocaine (EMLA) cream Apply to affected area once 30 g 3  . mirtazapine (REMERON) 7.5 MG tablet Take 1 tablet (7.5 mg total) by mouth at bedtime. 30 tablet 0  . omeprazole (PRILOSEC) 40 MG capsule Take 40 mg by mouth every morning.     . ondansetron (ZOFRAN) 8 MG tablet Take 1 tablet (8 mg total) by mouth 2 (two) times daily as needed (Nausea or vomiting). 30 tablet 1  . prochlorperazine (COMPAZINE) 10 MG tablet Take 1 tablet (10 mg total) by mouth every 6 (six) hours as needed (Nausea or vomiting). 30 tablet 1  . simvastatin (ZOCOR) 40 MG tablet Take 40 mg by mouth daily.     No current facility-administered medications for this visit.    Facility-Administered Medications Ordered in Other Visits  Medication Dose Route Frequency Provider Last Rate Last Dose  . sodium chloride flush (NS) 0.9 % injection 10 mL  10 mL Intravenous PRN Nicholas Lose, MD   10 mL at 09/09/18 1114    PHYSICAL EXAMINATION: ECOG PERFORMANCE STATUS: 1 - Symptomatic but completely ambulatory  Vitals:   10/03/18 1014  BP: (!) 163/80  Pulse: 92  Resp: 20  Temp: 98.3 F (36.8 C)  SpO2: 100%   Filed Weights   10/03/18 1014  Weight: 166 lb 12.8 oz (75.7 kg)    GENERAL:alert, no distress and comfortable SKIN: skin color, texture, turgor are normal, no rashes or significant lesions EYES: normal, Conjunctiva are pink and non-injected, sclera clear OROPHARYNX:no exudate, no erythema and lips, buccal mucosa, and tongue normal  NECK: supple, thyroid normal size, non-tender, without nodularity LYMPH:  no palpable lymphadenopathy in the cervical, axillary or inguinal LUNGS: clear to auscultation and percussion with normal breathing effort HEART: regular rate & rhythm and no murmurs and no lower extremity edema ABDOMEN:abdomen soft, non-tender and normal bowel sounds MUSCULOSKELETAL:no cyanosis of digits and no clubbing  NEURO: alert & oriented x 3 with  fluent speech, no focal motor/sensory deficits EXTREMITIES: No lower extremity edema   LABORATORY DATA:  I have reviewed the data as listed CMP Latest Ref Rng & Units 09/26/2018 09/19/2018 09/09/2018  Glucose 70 - 99 mg/dL 107(H) 182(H) 202(H)  BUN 8 - 23 mg/dL _0 Creatinine 0.44 - 1.00 mg/dL 0.78 0.87 0.93  Sodium 135 - 145 mmol/L 138 137 137  Potassium 3.5 - 5.1 mmol/L 4.3 4.1 4.0  Chloride 98 - 111 mmol/L 105 107 104  CO2 22 - 32 mmol/L 22 21(L) 22  Calcium 8.9 - 10.3 mg/dL 9.5 9.2 9.9  Total Protein 6.5 - 8.1 g/dL 6.8 6.8 7.2  Total Bilirubin 0.3 - 1.2 mg/dL 0.2(L) 0.5 0.3  Alkaline Phos 38 - 126 U/L 77 81 81  AST 15 - 41 U/L 16 16 10(L)  ALT 0 - 44 U/L _1 Lab Results  Component Value Date   WBC 5.2 09/26/2018   HGB 10.8 (L) 09/26/2018   HCT 32.8 (L) 09/26/2018   MCV 81.2 09/26/2018   PLT 239 09/26/2018   NEUTROABS 2.5 09/26/2018    ASSESSMENT &  PLAN:  Malignant neoplasm of upper-inner quadrant of left breast in female, estrogen receptor positive ([place]) 08/15/18: Left Lumpectomy: IDC grade 3, 1.9 cm, Posterior margin positive (Muscle) due to a transected satellite nodule 0.5 cm, 0/3 LN Neg, ER 80%, PR 70%, Ki-67 40%, HER-2 +3+ by IHC with heterogeneity T1cN0 Stage 1A  Posterior Margin: based on discussions with surgery, it is muscle and hence no further surgery is possible.  Plan:  1. Adjuvant Taxol Herceptin weekly x12 followed by Herceptin maintenance for 1 year 2.Adjuvant radiation therapy 3.Followed by adjuvant antiestrogen therapy --------------------------------------------------------------------------------------------------------------------------------------------------------- Current treatment: Cycle 4 Taxol Herceptin Chemo toxicities: 1.  Hair thinning and hair loss: I will give her a prescription for a week 2.  Intermittent diarrhea  I reviewed her blood work.  Mild anemia we will monitor  We discussed the importance of icing to  decrease neuropathy. Return to clinic weekly for chemo and every other week for follow-up with me.   No orders of the defined types were placed in this encounter.  The patient has a good understanding of the overall plan. Gloria Mckinney agrees with it. Gloria Mckinney will call with any problems that may develop before the next visit here.   Harriette Ohara, MD 10/03/18

## 2018-10-03 NOTE — Patient Instructions (Signed)
[facility] Discharge Instructions for Patients Receiving Chemotherapy  Today you received the following chemotherapy agents:  Herceptin and Taxol.  To help prevent nausea and vomiting after your treatment, we encourage you to take your nausea medication as directed.   If you develop nausea and vomiting that is not controlled by your nausea medication, call the clinic.   BELOW ARE SYMPTOMS THAT SHOULD BE REPORTED IMMEDIATELY:  *FEVER GREATER THAN 100.5 F  *CHILLS WITH OR WITHOUT FEVER  NAUSEA AND VOMITING THAT IS NOT CONTROLLED WITH YOUR NAUSEA MEDICATION  *UNUSUAL SHORTNESS OF BREATH  *UNUSUAL BRUISING OR BLEEDING  TENDERNESS IN MOUTH AND THROAT WITH OR WITHOUT PRESENCE OF ULCERS  *URINARY PROBLEMS  *BOWEL PROBLEMS  UNUSUAL RASH Items with * indicate a potential emergency and should be followed up as soon as possible.  Feel free to call the clinic should you have any questions or concerns. The clinic phone number is [phone].  Please show the CHEMO ALERT CARD at check-in to the Emergency Department and triage nurse.   

## 2018-10-04 ENCOUNTER — Telehealth: Payer: Self-pay | Admitting: Hematology and Oncology

## 2018-10-04 NOTE — Telephone Encounter (Signed)
No los °

## 2018-10-08 ENCOUNTER — Telehealth: Payer: Self-pay | Admitting: Hematology

## 2018-10-08 NOTE — Telephone Encounter (Signed)
Pt's daughter called and reports pt is having some abdominal cramps, rectal urgency with very small bowel movement, since last night.  She has no significant nausea, able to eat and drink adequately, no fever or other new complaints.  Patient is on adjuvant chemotherapy Herceptin and Taxol for early stage breast cancer, status post 4 weeks treatment.  I think this is probably related to chemotherapy, I encouraged patient to drink fluids adequately, and watch for fever, or worsening diarrhea or abdominal pain.  She knows to call us back, or go to emergency room if she has worsening symptoms.   Truitt Merle  10/08/2018

## 2018-10-10 ENCOUNTER — Inpatient Hospital Stay: Payer: Federal, State, Local not specified - PPO

## 2018-10-10 ENCOUNTER — Inpatient Hospital Stay: Payer: Federal, State, Local not specified - PPO | Attending: Hematology and Oncology

## 2018-10-10 VITALS — BP 152/70 | HR 88 | Temp 98.6°F | Resp 18

## 2018-10-10 DIAGNOSIS — Z95828 Presence of other vascular implants and grafts: Secondary | ICD-10-CM

## 2018-10-10 DIAGNOSIS — R5383 Other fatigue: Secondary | ICD-10-CM | POA: Insufficient documentation

## 2018-10-10 DIAGNOSIS — Z79899 Other long term (current) drug therapy: Secondary | ICD-10-CM | POA: Insufficient documentation

## 2018-10-10 DIAGNOSIS — C50212 Malignant neoplasm of upper-inner quadrant of left female breast: Secondary | ICD-10-CM

## 2018-10-10 DIAGNOSIS — Z794 Long term (current) use of insulin: Secondary | ICD-10-CM | POA: Diagnosis not present

## 2018-10-10 DIAGNOSIS — Z170 Estrogen receptor positive status [ER+]: Secondary | ICD-10-CM | POA: Insufficient documentation

## 2018-10-10 DIAGNOSIS — Z7982 Long term (current) use of aspirin: Secondary | ICD-10-CM | POA: Diagnosis not present

## 2018-10-10 DIAGNOSIS — Z5112 Encounter for antineoplastic immunotherapy: Secondary | ICD-10-CM | POA: Insufficient documentation

## 2018-10-10 DIAGNOSIS — Z5111 Encounter for antineoplastic chemotherapy: Secondary | ICD-10-CM | POA: Diagnosis not present

## 2018-10-10 DIAGNOSIS — R197 Diarrhea, unspecified: Secondary | ICD-10-CM | POA: Insufficient documentation

## 2018-10-10 DIAGNOSIS — D649 Anemia, unspecified: Secondary | ICD-10-CM | POA: Diagnosis not present

## 2018-10-10 DIAGNOSIS — R110 Nausea: Secondary | ICD-10-CM | POA: Insufficient documentation

## 2018-10-10 DIAGNOSIS — R109 Unspecified abdominal pain: Secondary | ICD-10-CM | POA: Insufficient documentation

## 2018-10-10 LAB — CMP (CANCER CENTER ONLY)
ALT: 24 U/L (ref 0–44)
AST: 14 U/L — ABNORMAL LOW (ref 15–41)
Albumin: 3.4 g/dL — ABNORMAL LOW (ref 3.5–5.0)
Alkaline Phosphatase: 74 U/L (ref 38–126)
Anion gap: 10 (ref 5–15)
BUN: 13 mg/dL (ref 8–23)
CHLORIDE: 105 mmol/L (ref 98–111)
CO2: 21 mmol/L — ABNORMAL LOW (ref 22–32)
Calcium: 9.1 mg/dL (ref 8.9–10.3)
Creatinine: 0.97 mg/dL (ref 0.44–1.00)
GFR, Est AFR Am: >60 mL/min (ref >60)
GFR, Estimated: 59 mL/min — ABNORMAL LOW (ref >60)
Glucose, Bld: 218 mg/dL — ABNORMAL HIGH (ref 70–99)
POTASSIUM: 3.9 mmol/L (ref 3.5–5.1)
Sodium: 136 mmol/L (ref 135–145)
Total Bilirubin: 0.3 mg/dL (ref 0.3–1.2)
Total Protein: 6.6 g/dL (ref 6.5–8.1)

## 2018-10-10 LAB — CBC WITH DIFFERENTIAL (CANCER CENTER ONLY)
Abs Immature Granulocytes: 0.04 10*3/uL (ref 0.00–0.07)
BASOS ABS: 0 10*3/uL (ref 0.0–0.1)
BASOS PCT: 1 %
Eosinophils Absolute: 0.2 10*3/uL (ref 0.0–0.5)
Eosinophils Relative: 4 %
HCT: 33.2 % — ABNORMAL LOW (ref 36.0–46.0)
Hemoglobin: 11.1 g/dL — ABNORMAL LOW (ref 12.0–15.0)
Immature Granulocytes: 1 %
LYMPHS PCT: 41 %
Lymphs Abs: 2 10*3/uL (ref 0.7–4.0)
MCH: 27.5 pg (ref 26.0–34.0)
MCHC: 33.4 g/dL (ref 30.0–36.0)
MCV: 82.4 fL (ref 80.0–100.0)
Monocytes Absolute: 0.3 10*3/uL (ref 0.1–1.0)
Monocytes Relative: 7 %
NEUTROS ABS: 2.2 10*3/uL (ref 1.7–7.7)
Neutrophils Relative %: 46 %
Platelet Count: 257 10*3/uL (ref 150–400)
RBC: 4.03 MIL/uL (ref 3.87–5.11)
RDW: 13.9 % (ref 11.5–15.5)
WBC Count: 4.8 10*3/uL (ref 4.0–10.5)
nRBC: 0 % (ref 0.0–0.2)

## 2018-10-10 MED ORDER — ACETAMINOPHEN 325 MG PO TABS
ORAL_TABLET | ORAL | Status: AC
  Filled 2018-10-10: qty 2

## 2018-10-10 MED ORDER — ACETAMINOPHEN 325 MG PO TABS
650.0000 mg | ORAL_TABLET | Freq: Once | ORAL | Status: AC
  Administered 2018-10-10: 650 mg via ORAL

## 2018-10-10 MED ORDER — SODIUM CHLORIDE 0.9 % IV SOLN
20.0000 mg | Freq: Once | INTRAVENOUS | Status: AC
  Administered 2018-10-10: 20 mg via INTRAVENOUS
  Filled 2018-10-10: qty 2

## 2018-10-10 MED ORDER — DIPHENHYDRAMINE HCL 50 MG/ML IJ SOLN
INTRAMUSCULAR | Status: AC
  Filled 2018-10-10: qty 1

## 2018-10-10 MED ORDER — TRASTUZUMAB CHEMO 150 MG IV SOLR
150.0000 mg | Freq: Once | INTRAVENOUS | Status: AC
  Administered 2018-10-10: 150 mg via INTRAVENOUS
  Filled 2018-10-10: qty 7.14

## 2018-10-10 MED ORDER — DIPHENHYDRAMINE HCL 50 MG/ML IJ SOLN
50.0000 mg | Freq: Once | INTRAMUSCULAR | Status: AC
  Administered 2018-10-10: 50 mg via INTRAVENOUS

## 2018-10-10 MED ORDER — SODIUM CHLORIDE 0.9% FLUSH
10.0000 mL | Freq: Once | INTRAVENOUS | Status: AC
  Administered 2018-10-10: 10 mL
  Filled 2018-10-10: qty 10

## 2018-10-10 MED ORDER — FAMOTIDINE IN NACL 20-0.9 MG/50ML-% IV SOLN: 20.0000 mg | Freq: Once | INTRAVENOUS | Status: DC

## 2018-10-10 MED ORDER — SODIUM CHLORIDE 0.9% FLUSH
10.0000 mL | INTRAVENOUS | Status: DC | PRN
  Administered 2018-10-10: 10 mL
  Filled 2018-10-10: qty 10

## 2018-10-10 MED ORDER — HEPARIN SOD (PORK) LOCK FLUSH 100 UNIT/ML IV SOLN
500.0000 [IU] | Freq: Once | INTRAVENOUS | Status: AC | PRN
  Administered 2018-10-10: 500 [IU]
  Filled 2018-10-10: qty 5

## 2018-10-10 MED ORDER — SODIUM CHLORIDE 0.9 % IV SOLN
Freq: Once | INTRAVENOUS | Status: AC
  Administered 2018-10-10: 12:00:00 via INTRAVENOUS
  Filled 2018-10-10: qty 250

## 2018-10-10 MED ORDER — SODIUM CHLORIDE 0.9 % IV SOLN
80.0000 mg/m2 | Freq: Once | INTRAVENOUS | Status: AC
  Administered 2018-10-10: 150 mg via INTRAVENOUS
  Filled 2018-10-10: qty 25

## 2018-10-17 ENCOUNTER — Inpatient Hospital Stay: Payer: Federal, State, Local not specified - PPO

## 2018-10-17 ENCOUNTER — Inpatient Hospital Stay (HOSPITAL_BASED_OUTPATIENT_CLINIC_OR_DEPARTMENT_OTHER): Payer: Federal, State, Local not specified - PPO | Admitting: Hematology and Oncology

## 2018-10-17 VITALS — BP 117/59 | HR 93 | Temp 98.3°F | Resp 16

## 2018-10-17 DIAGNOSIS — R109 Unspecified abdominal pain: Secondary | ICD-10-CM

## 2018-10-17 DIAGNOSIS — Z794 Long term (current) use of insulin: Secondary | ICD-10-CM

## 2018-10-17 DIAGNOSIS — Z5111 Encounter for antineoplastic chemotherapy: Secondary | ICD-10-CM | POA: Diagnosis not present

## 2018-10-17 DIAGNOSIS — C50212 Malignant neoplasm of upper-inner quadrant of left female breast: Secondary | ICD-10-CM

## 2018-10-17 DIAGNOSIS — Z170 Estrogen receptor positive status [ER+]: Secondary | ICD-10-CM | POA: Diagnosis not present

## 2018-10-17 DIAGNOSIS — R110 Nausea: Secondary | ICD-10-CM

## 2018-10-17 DIAGNOSIS — Z5112 Encounter for antineoplastic immunotherapy: Secondary | ICD-10-CM

## 2018-10-17 DIAGNOSIS — R197 Diarrhea, unspecified: Secondary | ICD-10-CM

## 2018-10-17 DIAGNOSIS — D649 Anemia, unspecified: Secondary | ICD-10-CM

## 2018-10-17 DIAGNOSIS — Z7982 Long term (current) use of aspirin: Secondary | ICD-10-CM

## 2018-10-17 DIAGNOSIS — Z79899 Other long term (current) drug therapy: Secondary | ICD-10-CM

## 2018-10-17 LAB — COMPREHENSIVE METABOLIC PANEL
ALT: 23 U/L (ref 0–44)
AST: 21 U/L (ref 15–41)
Albumin: 3.7 g/dL (ref 3.5–5.0)
Alkaline Phosphatase: 73 U/L (ref 38–126)
Anion gap: 11 (ref 5–15)
BUN: 12 mg/dL (ref 8–23)
CHLORIDE: 105 mmol/L (ref 98–111)
CO2: 19 mmol/L — AB (ref 22–32)
Calcium: 9 mg/dL (ref 8.9–10.3)
Creatinine, Ser: 0.84 mg/dL (ref 0.44–1.00)
GFR calc Af Amer: >60 mL/min (ref >60)
GFR calc non Af Amer: >60 mL/min (ref >60)
Glucose, Bld: 191 mg/dL — ABNORMAL HIGH (ref 70–99)
Potassium: 3.9 mmol/L (ref 3.5–5.1)
SODIUM: 135 mmol/L (ref 135–145)
Total Bilirubin: 0.5 mg/dL (ref 0.3–1.2)
Total Protein: 6.5 g/dL (ref 6.5–8.1)

## 2018-10-17 LAB — CBC WITH DIFFERENTIAL (CANCER CENTER ONLY)
Abs Immature Granulocytes: 0.09 10*3/uL — ABNORMAL HIGH (ref 0.00–0.07)
BASOS ABS: 0 10*3/uL (ref 0.0–0.1)
Basophils Relative: 1 %
Eosinophils Absolute: 0.2 10*3/uL (ref 0.0–0.5)
Eosinophils Relative: 4 %
HCT: 33.1 % — ABNORMAL LOW (ref 36.0–46.0)
Hemoglobin: 10.8 g/dL — ABNORMAL LOW (ref 12.0–15.0)
Immature Granulocytes: 2 %
Lymphocytes Relative: 31 %
Lymphs Abs: 1.7 10*3/uL (ref 0.7–4.0)
MCH: 27.1 pg (ref 26.0–34.0)
MCHC: 32.6 g/dL (ref 30.0–36.0)
MCV: 83 fL (ref 80.0–100.0)
Monocytes Absolute: 0.5 10*3/uL (ref 0.1–1.0)
Monocytes Relative: 9 %
NEUTROS ABS: 3 10*3/uL (ref 1.7–7.7)
Neutrophils Relative %: 53 %
PLATELETS: 245 10*3/uL (ref 150–400)
RBC: 3.99 MIL/uL (ref 3.87–5.11)
RDW: 14.7 % (ref 11.5–15.5)
WBC Count: 5.6 10*3/uL (ref 4.0–10.5)
nRBC: 0 % (ref 0.0–0.2)

## 2018-10-17 MED ORDER — SODIUM CHLORIDE 0.9 % IV SOLN
60.0000 mg/m2 | Freq: Once | INTRAVENOUS | Status: AC
  Administered 2018-10-17: 114 mg via INTRAVENOUS
  Filled 2018-10-17: qty 19

## 2018-10-17 MED ORDER — SODIUM CHLORIDE 0.9 % IV SOLN
20.0000 mg | Freq: Once | INTRAVENOUS | Status: AC
  Administered 2018-10-17: 20 mg via INTRAVENOUS
  Filled 2018-10-17: qty 2

## 2018-10-17 MED ORDER — DIPHENHYDRAMINE HCL 50 MG/ML IJ SOLN
50.0000 mg | Freq: Once | INTRAMUSCULAR | Status: AC
  Administered 2018-10-17: 50 mg via INTRAVENOUS

## 2018-10-17 MED ORDER — SODIUM CHLORIDE 0.9% FLUSH
10.0000 mL | INTRAVENOUS | Status: DC | PRN
  Administered 2018-10-17: 10 mL
  Filled 2018-10-17: qty 10

## 2018-10-17 MED ORDER — MAGIC MOUTHWASH W/LIDOCAINE: 5.0000 mL | Freq: Three times a day (TID) | ORAL | 0 refills | Status: DC | PRN

## 2018-10-17 MED ORDER — FAMOTIDINE IN NACL 20-0.9 MG/50ML-% IV SOLN: 20.0000 mg | Freq: Once | INTRAVENOUS | Status: DC

## 2018-10-17 MED ORDER — SODIUM CHLORIDE 0.9 % IV SOLN
Freq: Once | INTRAVENOUS | Status: AC
  Administered 2018-10-17: 10:00:00 via INTRAVENOUS
  Filled 2018-10-17: qty 250

## 2018-10-17 MED ORDER — HEPARIN SOD (PORK) LOCK FLUSH 100 UNIT/ML IV SOLN
500.0000 [IU] | Freq: Once | INTRAVENOUS | Status: AC | PRN
  Administered 2018-10-17: 500 [IU]
  Filled 2018-10-17: qty 5

## 2018-10-17 MED ORDER — ACETAMINOPHEN 325 MG PO TABS
650.0000 mg | ORAL_TABLET | Freq: Once | ORAL | Status: AC
  Administered 2018-10-17: 650 mg via ORAL

## 2018-10-17 MED ORDER — TRASTUZUMAB CHEMO 150 MG IV SOLR
150.0000 mg | Freq: Once | INTRAVENOUS | Status: AC
  Administered 2018-10-17: 150 mg via INTRAVENOUS
  Filled 2018-10-17: qty 7.14

## 2018-10-17 MED ORDER — DIPHENHYDRAMINE HCL 50 MG/ML IJ SOLN
INTRAMUSCULAR | Status: AC
  Filled 2018-10-17: qty 1

## 2018-10-17 MED ORDER — ACETAMINOPHEN 325 MG PO TABS
ORAL_TABLET | ORAL | Status: AC
  Filled 2018-10-17: qty 2

## 2018-10-17 NOTE — Assessment & Plan Note (Signed)
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  Plan:  1.Adjuvant Taxol Herceptin weekly x12 followed by Herceptin maintenance for 1 year 2.Adjuvant radiation therapy 3.Followed by adjuvant antiestrogen therapy --------------------------------------------------------------------------------------------------------------------------------------------------------- Current treatment: Cycle 6 Taxol Herceptin Chemo toxicities: 1.  Hair thinning and hair loss: I will give her a prescription for a week 2.  Intermittent diarrhea with abdominal cramps  I reviewed her blood work.  Mild anemia we will monitor  Return to clinic weekly for chemo and every other week for follow-up with me.

## 2018-10-17 NOTE — Progress Notes (Signed)
Patient Care Team: [name], MD as PCP - General (Family Medicine) Nicholas Lose, MD as Consulting Physician (Hematology and Oncology) Delice Bison Charlestine Massed, NP as Nurse Practitioner (Hematology and Oncology) Fanny Skates, MD as Consulting Physician (General Surgery)  DIAGNOSIS:  Encounter Diagnosis  Name Primary?  . Malignant neoplasm of upper-inner quadrant of left breast in female, estrogen receptor positive ([place])     SUMMARY OF ONCOLOGIC HISTORY:   Malignant neoplasm of upper-inner quadrant of left breast in female, estrogen receptor positive ([place])   07/18/2018 Initial Diagnosis    Screening detected left breast mass at 10 o'clock position 1.8 cm axilla negative, biopsy revealed grade 3 IDC ER 80%, PR 70%, Ki-67 40%, HER-2 +3+ by IHC with heterogeneity, T1c N0 stage Ia clinical stage    08/15/2018 Surgery    Left Lumpectomy: IDC grade 3, 1.9 cm, Posterior margin positive (Muscle) due to a transected satellite nodule 0.5 cm, 0/3 LN Neg, ER 80%, PR 70%, Ki-67 40%, HER-2 +3+ by IHC with heterogeneity T1cN0 Stage 1A    08/24/2018 Cancer Staging    Staging form: Breast, AJCC 8th Edition - Pathologic: Stage IA (pT1b, pN0, cM0, G3, ER+, PR+, HER2+) - Signed by Gardenia Phlegm, NP on 08/24/2018    09/09/2018 -  Chemotherapy    The patient had trastuzumab (HERCEPTIN) 300 mg in sodium chloride 0.9 % 250 mL chemo infusion, 294 mg, Intravenous,  Once, 2 of 16 cycles Administration: 300 mg (09/09/2018), 150 mg (09/19/2018), 150 mg (10/10/2018), 150 mg (09/26/2018), 150 mg (10/03/2018) PACLitaxel (TAXOL) 150 mg in sodium chloride 0.9 % 250 mL chemo infusion (</= 70m/m2), 80 mg/m2 = 150 mg, Intravenous,  Once, 2 of 3 cycles Dose modification: 60 mg/m2 (original dose 80 mg/m2, Cycle 2, Reason: Dose not tolerated) Administration: 150 mg (09/09/2018), 150 mg (09/19/2018), 150 mg (10/10/2018), 150 mg (09/26/2018), 150 mg (10/03/2018)  for chemotherapy treatment.      CHIEF COMPLIANT:  Cycle 6 Taxol and Herceptin  INTERVAL HISTORY: Gloria Mckinney a 73year old with above-mentioned history left breast cancer underwent lumpectomy is here on adjuvant chemotherapy today cycle 6 of Taxol and Herceptin.  She has been complaining of a lot of abdominal cramps diarrhea due to chemo.  She also has nausea.  She is also losing hair.  She has had days where she lost 10-15 loose stools per day but did not take any Imodium.  She is worried about constipation and therefore is not using Imodium.  She tells me that she is drinking lots of water 2 to 3 L/day.  REVIEW OF SYSTEMS:   Constitutional: Denies fevers, chills or abnormal weight loss Eyes: Denies blurriness of vision Ears, nose, mouth, throat, and face: Denies mucositis or sore throat Respiratory: Denies cough, dyspnea or wheezes Cardiovascular: Denies palpitation, chest discomfort Gastrointestinal: Nausea, abdominal cramps and diarrhea Skin: Denies abnormal skin rashes Lymphatics: Denies new lymphadenopathy or easy bruising Neurological:Denies numbness, tingling or new weaknesses Behavioral/Psych: Mood is stable, no new changes  Extremities: No lower extremity edema  All other systems were reviewed with the patient and are negative.  I have reviewed the past medical history, past surgical history, social history and family history with the patient and they are unchanged from previous note.  ALLERGIES:  is allergic to penicillins; cephalexin; and statins.  MEDICATIONS:  Current Outpatient Medications  Medication Sig Dispense Refill  . aspirin EC 81 MG tablet Take 81 mg by mouth daily.    . Calcium Carb-Cholecalciferol (CALCIUM + VITAMIN D3 PO) Take  1 tablet by mouth daily.    . Empagliflozin-metFORMIN HCl (SYNJARDY) 12.12-998 MG TABS Take 1 tablet by mouth 2 (two) times daily.    Marland Kitchen EXFORGE 5-320 MG tablet Take 1 tablet by mouth daily.    . ferrous sulfate 325 (65 FE) MG tablet Take 325 mg by mouth daily with  breakfast.    . hydrochlorothiazide (HYDRODIURIL) 25 MG tablet Take 25 mg by mouth daily.    Marland Kitchen HYDROcodone-acetaminophen (NORCO) 5-325 MG tablet Take 1-2 tablets by mouth every 6 (six) hours as needed for moderate pain or severe pain. 20 tablet 0  . Insulin Glargine, 1 Unit Dial, (TOUJEO SOLOSTAR) 300 UNIT/ML SOPN Inject 50 Units into the skin every evening.    . lidocaine-prilocaine (EMLA) cream Apply to affected area once 30 g 3  . magic mouthwash w/lidocaine SOLN Take 5 mLs by mouth 3 (three) times daily as needed for mouth pain. 100 mL 0  . mirtazapine (REMERON) 7.5 MG tablet Take 1 tablet (7.5 mg total) by mouth at bedtime. 30 tablet 0  . omeprazole (PRILOSEC) 40 MG capsule Take 40 mg by mouth every morning.     . ondansetron (ZOFRAN) 8 MG tablet Take 1 tablet (8 mg total) by mouth 2 (two) times daily as needed (Nausea or vomiting). 30 tablet 1  . prochlorperazine (COMPAZINE) 10 MG tablet Take 1 tablet (10 mg total) by mouth every 6 (six) hours as needed (Nausea or vomiting). 30 tablet 1  . simvastatin (ZOCOR) 40 MG tablet Take 40 mg by mouth daily.     No current facility-administered medications for this visit.    Facility-Administered Medications Ordered in Other Visits  Medication Dose Route Frequency Provider Last Rate Last Dose  . sodium chloride flush (NS) 0.9 % injection 10 mL  10 mL Intravenous PRN Nicholas Lose, MD   10 mL at 09/09/18 1114    PHYSICAL EXAMINATION: ECOG PERFORMANCE STATUS: 1 - Symptomatic but completely ambulatory  There were no vitals filed for this visit. There were no vitals filed for this visit.  GENERAL:alert, no distress and comfortable SKIN: skin color, texture, turgor are normal, no rashes or significant lesions EYES: normal, Conjunctiva are pink and non-injected, sclera clear OROPHARYNX:no exudate, no erythema and lips, buccal mucosa, and tongue normal  NECK: supple, thyroid normal size, non-tender, without nodularity LYMPH:  no palpable  lymphadenopathy in the cervical, axillary or inguinal LUNGS: clear to auscultation and percussion with normal breathing effort HEART: regular rate & rhythm and no murmurs and no lower extremity edema ABDOMEN:abdomen soft, non-tender and normal bowel sounds MUSCULOSKELETAL:no cyanosis of digits and no clubbing  NEURO: alert & oriented x 3 with fluent speech, no focal motor/sensory deficits EXTREMITIES: No lower extremity edema   LABORATORY DATA:  I have reviewed the data as listed CMP Latest Ref Rng & Units 10/17/2018 10/10/2018 10/03/2018  Glucose 70 - 99 mg/dL 191(H) 218(H) 183(H)  BUN 8 - 23 mg/dL _0 Creatinine 0.44 - 1.00 mg/dL 0.84 0.97 0.86  Sodium 135 - 145 mmol/L 135 136 138  Potassium 3.5 - 5.1 mmol/L 3.9 3.9 4.0  Chloride 98 - 111 mmol/L 105 105 104  CO2 22 - 32 mmol/L 19(L) 21(L) 23  Calcium 8.9 - 10.3 mg/dL 9.0 9.1 9.2  Total Protein 6.5 - 8.1 g/dL 6.5 6.6 6.7  Total Bilirubin 0.3 - 1.2 mg/dL 0.5 0.3 0.3  Alkaline Phos 38 - 126 U/L 73 74 79  AST 15 - 41 U/L 21 14(L) 15  ALT  0 - 44 U/L _0 Lab Results  Component Value Date   WBC 5.6 10/17/2018   HGB 10.8 (L) 10/17/2018   HCT 33.1 (L) 10/17/2018   MCV 83.0 10/17/2018   PLT 245 10/17/2018   NEUTROABS 3.0 10/17/2018    ASSESSMENT & PLAN:  Malignant neoplasm of upper-inner quadrant of left breast in female, estrogen receptor positive ([facility]) 08/15/18:Left Lumpectomy: IDC grade 3, 1.9 cm, Posterior margin positive (Muscle) due to a transected satellite nodule 0.5 cm, 0/3 LN Neg, ER 80%, PR 70%, Ki-67 40%, HER-2 +3+ by IHC with heterogeneity T1cN0 Stage 1A  Posterior Margin: based on discussions with surgery, it is muscle and hence no further surgery is possible.  Plan:  1.Adjuvant Taxol Herceptin weekly x12 followed by Herceptin maintenance for 1 year 2.Adjuvant radiation therapy 3.Followed by adjuvant antiestrogen  therapy --------------------------------------------------------------------------------------------------------------------------------------------------------- Current treatment: Cycle 6 Taxol Herceptin Chemo toxicities: 1.  Hair thinning and hair loss: I will give her a prescription for a week 2.  Intermittent diarrhea with abdominal cramps: Discussed with her about taking Imodium but she is reluctant to take any medication. 3.  Denies any neuropathy.  Because of her intolerance generally speaking, we reduced the dosage of Taxol to 60 mg/m. She was also requesting if the total number of doses of chemo can be reduced.  I informed her that we will assess how she is tolerating it over the next 4 weeks and determine if only 10 treatments can be given versus continuing and finishing all 12 cycles.  I reviewed her blood work.  Mild anemia we will monitor  Return to clinic weekly for chemo and every other week for follow-up with me.   No orders of the defined types were placed in this encounter.  The patient has a good understanding of the overall plan. she agrees with it. she will call with any problems that may develop before the next visit here.   Harriette Ohara, MD 10/17/18

## 2018-10-17 NOTE — Patient Instructions (Signed)
[facility] Discharge Instructions for Patients Receiving Chemotherapy  Today you received the following chemotherapy agents :  Herceptin,  Taxol.  To help prevent nausea and vomiting after your treatment, we encourage you to take your nausea medication as prescribed.   If you develop nausea and vomiting that is not controlled by your nausea medication, call the clinic.   BELOW ARE SYMPTOMS THAT SHOULD BE REPORTED IMMEDIATELY:  *FEVER GREATER THAN 100.5 F  *CHILLS WITH OR WITHOUT FEVER  NAUSEA AND VOMITING THAT IS NOT CONTROLLED WITH YOUR NAUSEA MEDICATION  *UNUSUAL SHORTNESS OF BREATH  *UNUSUAL BRUISING OR BLEEDING  TENDERNESS IN MOUTH AND THROAT WITH OR WITHOUT PRESENCE OF ULCERS  *URINARY PROBLEMS  *BOWEL PROBLEMS  UNUSUAL RASH Items with * indicate a potential emergency and should be followed up as soon as possible.  Feel free to call the clinic should you have any questions or concerns. The clinic phone number is [phone].  Please show the CHEMO ALERT CARD at check-in to the Emergency Department and triage nurse.   

## 2018-10-18 ENCOUNTER — Other Ambulatory Visit: Payer: Self-pay

## 2018-10-18 MED ORDER — MAGIC MOUTHWASH W/LIDOCAINE: 5.0000 mL | Freq: Three times a day (TID) | ORAL | 0 refills | Status: DC | PRN

## 2018-10-24 ENCOUNTER — Inpatient Hospital Stay: Payer: Federal, State, Local not specified - PPO

## 2018-10-24 ENCOUNTER — Other Ambulatory Visit: Payer: Self-pay

## 2018-10-24 VITALS — BP 171/70 | HR 89 | Temp 98.3°F | Resp 16

## 2018-10-24 DIAGNOSIS — Z170 Estrogen receptor positive status [ER+]: Principal | ICD-10-CM

## 2018-10-24 DIAGNOSIS — C50212 Malignant neoplasm of upper-inner quadrant of left female breast: Secondary | ICD-10-CM | POA: Diagnosis not present

## 2018-10-24 DIAGNOSIS — Z95828 Presence of other vascular implants and grafts: Secondary | ICD-10-CM

## 2018-10-24 LAB — CBC WITH DIFFERENTIAL (CANCER CENTER ONLY)
ABS IMMATURE GRANULOCYTES: 0.06 10*3/uL (ref 0.00–0.07)
Basophils Absolute: 0 10*3/uL (ref 0.0–0.1)
Basophils Relative: 1 %
Eosinophils Absolute: 0.2 10*3/uL (ref 0.0–0.5)
Eosinophils Relative: 3 %
HCT: 32.5 % — ABNORMAL LOW (ref 36.0–46.0)
Hemoglobin: 10.6 g/dL — ABNORMAL LOW (ref 12.0–15.0)
Immature Granulocytes: 1 %
LYMPHS PCT: 35 %
Lymphs Abs: 1.7 10*3/uL (ref 0.7–4.0)
MCH: 27.5 pg (ref 26.0–34.0)
MCHC: 32.6 g/dL (ref 30.0–36.0)
MCV: 84.2 fL (ref 80.0–100.0)
Monocytes Absolute: 0.4 10*3/uL (ref 0.1–1.0)
Monocytes Relative: 9 %
Neutro Abs: 2.5 10*3/uL (ref 1.7–7.7)
Neutrophils Relative %: 51 %
Platelet Count: 277 10*3/uL (ref 150–400)
RBC: 3.86 MIL/uL — ABNORMAL LOW (ref 3.87–5.11)
RDW: 15.1 % (ref 11.5–15.5)
WBC Count: 4.8 10*3/uL (ref 4.0–10.5)
nRBC: 0 % (ref 0.0–0.2)

## 2018-10-24 LAB — CMP (CANCER CENTER ONLY)
ALK PHOS: 79 U/L (ref 38–126)
ALT: 21 U/L (ref 0–44)
ANION GAP: 11 (ref 5–15)
AST: 15 U/L (ref 15–41)
Albumin: 3.3 g/dL — ABNORMAL LOW (ref 3.5–5.0)
BUN: 10 mg/dL (ref 8–23)
CALCIUM: 9.4 mg/dL (ref 8.9–10.3)
CO2: 22 mmol/L (ref 22–32)
CREATININE: 0.85 mg/dL (ref 0.44–1.00)
Chloride: 105 mmol/L (ref 98–111)
GFR, Est AFR Am: >60 mL/min (ref >60)
GFR, Estimated: >60 mL/min (ref >60)
Glucose, Bld: 163 mg/dL — ABNORMAL HIGH (ref 70–99)
Potassium: 4.3 mmol/L (ref 3.5–5.1)
SODIUM: 138 mmol/L (ref 135–145)
Total Bilirubin: 0.3 mg/dL (ref 0.3–1.2)
Total Protein: 6.5 g/dL (ref 6.5–8.1)

## 2018-10-24 MED ORDER — DIPHENHYDRAMINE HCL 25 MG PO CAPS
ORAL_CAPSULE | ORAL | Status: AC
  Filled 2018-10-24: qty 2

## 2018-10-24 MED ORDER — DIPHENHYDRAMINE HCL 50 MG/ML IJ SOLN
INTRAMUSCULAR | Status: AC
  Filled 2018-10-24: qty 1

## 2018-10-24 MED ORDER — SODIUM CHLORIDE 0.9 % IV SOLN
Freq: Once | INTRAVENOUS | Status: AC
  Administered 2018-10-24: 12:00:00 via INTRAVENOUS
  Filled 2018-10-24: qty 250

## 2018-10-24 MED ORDER — HEPARIN SOD (PORK) LOCK FLUSH 100 UNIT/ML IV SOLN
500.0000 [IU] | Freq: Once | INTRAVENOUS | Status: AC | PRN
  Administered 2018-10-24: 500 [IU]
  Filled 2018-10-24: qty 5

## 2018-10-24 MED ORDER — ACETAMINOPHEN 325 MG PO TABS
650.0000 mg | ORAL_TABLET | Freq: Once | ORAL | Status: AC
  Administered 2018-10-24: 650 mg via ORAL

## 2018-10-24 MED ORDER — SODIUM CHLORIDE 0.9% FLUSH
10.0000 mL | Freq: Once | INTRAVENOUS | Status: AC
  Administered 2018-10-24: 10 mL
  Filled 2018-10-24: qty 10

## 2018-10-24 MED ORDER — SODIUM CHLORIDE 0.9 % IV SOLN
60.0000 mg/m2 | Freq: Once | INTRAVENOUS | Status: AC
  Administered 2018-10-24: 114 mg via INTRAVENOUS
  Filled 2018-10-24: qty 19

## 2018-10-24 MED ORDER — SODIUM CHLORIDE 0.9% FLUSH
10.0000 mL | INTRAVENOUS | Status: DC | PRN
  Administered 2018-10-24: 10 mL
  Filled 2018-10-24: qty 10

## 2018-10-24 MED ORDER — ACETAMINOPHEN 325 MG PO TABS
ORAL_TABLET | ORAL | Status: AC
  Filled 2018-10-24: qty 2

## 2018-10-24 MED ORDER — DIPHENHYDRAMINE HCL 50 MG/ML IJ SOLN
50.0000 mg | Freq: Once | INTRAMUSCULAR | Status: AC
  Administered 2018-10-24: 50 mg via INTRAVENOUS

## 2018-10-24 MED ORDER — TRASTUZUMAB CHEMO 150 MG IV SOLR
150.0000 mg | Freq: Once | INTRAVENOUS | Status: AC
  Administered 2018-10-24: 150 mg via INTRAVENOUS
  Filled 2018-10-24: qty 7.14

## 2018-10-24 MED ORDER — SODIUM CHLORIDE 0.9 % IV SOLN
20.0000 mg | Freq: Once | INTRAVENOUS | Status: AC
  Administered 2018-10-24: 20 mg via INTRAVENOUS
  Filled 2018-10-24: qty 2

## 2018-10-24 NOTE — Patient Instructions (Signed)
[facility] Discharge Instructions for Patients Receiving Chemotherapy  Today you received the following chemotherapy agents :  Herceptin,  Taxol.  To help prevent nausea and vomiting after your treatment, we encourage you to take your nausea medication as prescribed.   If you develop nausea and vomiting that is not controlled by your nausea medication, call the clinic.   BELOW ARE SYMPTOMS THAT SHOULD BE REPORTED IMMEDIATELY:  *FEVER GREATER THAN 100.5 F  *CHILLS WITH OR WITHOUT FEVER  NAUSEA AND VOMITING THAT IS NOT CONTROLLED WITH YOUR NAUSEA MEDICATION  *UNUSUAL SHORTNESS OF BREATH  *UNUSUAL BRUISING OR BLEEDING  TENDERNESS IN MOUTH AND THROAT WITH OR WITHOUT PRESENCE OF ULCERS  *URINARY PROBLEMS  *BOWEL PROBLEMS  UNUSUAL RASH Items with * indicate a potential emergency and should be followed up as soon as possible.  Feel free to call the clinic should you have any questions or concerns. The clinic phone number is [phone].  Please show the CHEMO ALERT CARD at check-in to the Emergency Department and triage nurse.   

## 2018-10-26 NOTE — Progress Notes (Signed)
Patient Care Team: [name], MD as PCP - General (Family Medicine) Nicholas Lose, MD as Consulting Physician (Hematology and Oncology) Delice Bison, Charlestine Massed, NP as Nurse Practitioner (Hematology and Oncology) Fanny Skates, MD as Consulting Physician (General Surgery)  DIAGNOSIS:    ICD-10-CM   1. Malignant neoplasm of upper-inner quadrant of left breast in female, estrogen receptor positive ([place]) C50.212    Z17.0     SUMMARY OF ONCOLOGIC HISTORY:   Malignant neoplasm of upper-inner quadrant of left breast in female, estrogen receptor positive ([place])   07/18/2018 Initial Diagnosis    Screening detected left breast mass at 10 o'clock position 1.8 cm axilla negative, biopsy revealed grade 3 IDC ER 80%, PR 70%, Ki-67 40%, HER-2 +3+ by IHC with heterogeneity, T1c N0 stage Ia clinical stage    08/15/2018 Surgery    Left Lumpectomy: IDC grade 3, 1.9 cm, Posterior margin positive (Muscle) due to a transected satellite nodule 0.5 cm, 0/3 LN Neg, ER 80%, PR 70%, Ki-67 40%, HER-2 +3+ by IHC with heterogeneity T1cN0 Stage 1A    08/24/2018 Cancer Staging    Staging form: Breast, AJCC 8th Edition - Pathologic: Stage IA (pT1b, pN0, cM0, G3, ER+, PR+, HER2+) - Signed by Gardenia Phlegm, NP on 08/24/2018    09/09/2018 -  Chemotherapy    The patient had trastuzumab (HERCEPTIN) 300 mg in sodium chloride 0.9 % 250 mL chemo infusion, 294 mg, Intravenous,  Once, 2 of 16 cycles Administration: 300 mg (09/09/2018), 150 mg (09/19/2018), 150 mg (10/10/2018), 150 mg (09/26/2018), 150 mg (10/03/2018), 150 mg (10/17/2018), 150 mg (10/24/2018) PACLitaxel (TAXOL) 150 mg in sodium chloride 0.9 % 250 mL chemo infusion (</= 28m/m2), 80 mg/m2 = 150 mg, Intravenous,  Once, 2 of 3 cycles Dose modification: 60 mg/m2 (original dose 80 mg/m2, Cycle 2, Reason: Dose not tolerated) Administration: 150 mg (09/09/2018), 150 mg (09/19/2018), 150 mg (10/10/2018), 150 mg (09/26/2018), 150 mg (10/03/2018), 114 mg (10/17/2018), 114  mg (10/24/2018)  for chemotherapy treatment.      CHIEF COMPLIANT: Cycle 8 Taxol and Herceptin  INTERVAL HISTORY: Malory Rameshchandra PEimeris a 73y.o. with above-mentioned history of left breast cancer who underwent a lumpectomy and is on adjuvant chemotherapy with Taxol and Herceptin. She presents to the clinic today for cycle 8.  She is doing much better with reduced dosage of Taxol.  She denies neuropathy.  Denies any nausea or vomiting.  She does feel moderate fatigue.  REVIEW OF SYSTEMS:   Constitutional: Denies fevers, chills or abnormal weight loss Eyes: Denies blurriness of vision Ears, nose, mouth, throat, and face: Denies mucositis or sore throat Respiratory: Denies cough, dyspnea or wheezes Cardiovascular: Denies palpitation, chest discomfort Gastrointestinal: Denies nausea, heartburn or change in bowel habits Skin: Denies abnormal skin rashes Lymphatics: Denies new lymphadenopathy or easy bruising Neurological: Denies numbness, tingling or new weaknesses Behavioral/Psych: Mood is stable, no new changes  Extremities: No lower extremity edema Breast: denies any pain or lumps or nodules in either breasts All other systems were reviewed with the patient and are negative.  I have reviewed the past medical history, past surgical history, social history and family history with the patient and they are unchanged from previous note.  ALLERGIES:  is allergic to penicillins; cephalexin; and statins.  MEDICATIONS:  Current Outpatient Medications  Medication Sig Dispense Refill  . aspirin EC 81 MG tablet Take 81 mg by mouth daily.    . Calcium Carb-Cholecalciferol (CALCIUM + VITAMIN D3 PO) Take 1 tablet by mouth daily.    .Marland Kitchen  Empagliflozin-metFORMIN HCl (SYNJARDY) 12.12-998 MG TABS Take 1 tablet by mouth 2 (two) times daily.    Marland Kitchen EXFORGE 5-320 MG tablet Take 1 tablet by mouth daily.    . ferrous sulfate 325 (65 FE) MG tablet Take 325 mg by mouth daily with breakfast.    .  hydrochlorothiazide (HYDRODIURIL) 25 MG tablet Take 25 mg by mouth daily.    Marland Kitchen HYDROcodone-acetaminophen (NORCO) 5-325 MG tablet Take 1-2 tablets by mouth every 6 (six) hours as needed for moderate pain or severe pain. 20 tablet 0  . Insulin Glargine, 1 Unit Dial, (TOUJEO SOLOSTAR) 300 UNIT/ML SOPN Inject 50 Units into the skin every evening.    . lidocaine-prilocaine (EMLA) cream Apply to affected area once 30 g 3  . magic mouthwash w/lidocaine SOLN Take 5 mLs by mouth 3 (three) times daily as needed for mouth pain. Recipe: 1 part lidocaine, 1 part Maalox, 1 part Diphenhydramine. 100 mL 0  . mirtazapine (REMERON) 7.5 MG tablet Take 1 tablet (7.5 mg total) by mouth at bedtime. 30 tablet 0  . omeprazole (PRILOSEC) 40 MG capsule Take 40 mg by mouth every morning.     . ondansetron (ZOFRAN) 8 MG tablet Take 1 tablet (8 mg total) by mouth 2 (two) times daily as needed (Nausea or vomiting). 30 tablet 1  . prochlorperazine (COMPAZINE) 10 MG tablet Take 1 tablet (10 mg total) by mouth every 6 (six) hours as needed (Nausea or vomiting). 30 tablet 1  . simvastatin (ZOCOR) 40 MG tablet Take 40 mg by mouth daily.     No current facility-administered medications for this visit.    Facility-Administered Medications Ordered in Other Visits  Medication Dose Route Frequency Provider Last Rate Last Dose  . sodium chloride flush (NS) 0.9 % injection 10 mL  10 mL Intravenous PRN Nicholas Lose, MD   10 mL at 09/09/18 1114    PHYSICAL EXAMINATION: ECOG PERFORMANCE STATUS: 1 - Symptomatic but completely ambulatory  Vitals:   10/31/18 1039  BP: (!) 145/76  Pulse: 83  Resp: 18  Temp: 98.2 F (36.8 C)  SpO2: 100%   Filed Weights   10/31/18 1039  Weight: 167 lb 1.6 oz (75.8 kg)    GENERAL: alert, no distress and comfortable SKIN: skin color, texture, turgor are normal, no rashes or significant lesions EYES: normal, Conjunctiva are pink and non-injected, sclera clear OROPHARYNX: no exudate, no erythema  and lips, buccal mucosa, and tongue normal  NECK: supple, thyroid normal size, non-tender, without nodularity LYMPH: no palpable lymphadenopathy in the cervical, axillary or inguinal LUNGS: clear to auscultation and percussion with normal breathing effort HEART: regular rate & rhythm and no murmurs and no lower extremity edema ABDOMEN: abdomen soft, non-tender and normal bowel sounds MUSCULOSKELETAL: no cyanosis of digits and no clubbing  NEURO: alert & oriented x 3 with fluent speech, no focal motor/sensory deficits EXTREMITIES: No lower extremity edema  LABORATORY DATA:  I have reviewed the data as listed CMP Latest Ref Rng & Units 10/31/2018 10/24/2018 10/17/2018  Glucose 70 - 99 mg/dL 173(H) 163(H) 191(H)  BUN 8 - 23 mg/dL _0 Creatinine 0.44 - 1.00 mg/dL 0.89 0.85 0.84  Sodium 135 - 145 mmol/L 136 138 135  Potassium 3.5 - 5.1 mmol/L 4.4 4.3 3.9  Chloride 98 - 111 mmol/L 105 105 105  CO2 22 - 32 mmol/L 20(L) 22 19(L)  Calcium 8.9 - 10.3 mg/dL 9.2 9.4 9.0  Total Protein 6.5 - 8.1 g/dL 6.7 6.5 6.5  Total  Bilirubin 0.3 - 1.2 mg/dL 0.3 0.3 0.5  Alkaline Phos 38 - 126 U/L 65 79 73  AST 15 - 41 U/L _0 ALT 0 - 44 U/L _1 Lab Results  Component Value Date   WBC 5.2 10/31/2018   HGB 10.9 (L) 10/31/2018   HCT 33.3 (L) 10/31/2018   MCV 84.1 10/31/2018   PLT 291 10/31/2018   NEUTROABS 2.4 10/31/2018    ASSESSMENT & PLAN:  Malignant neoplasm of upper-inner quadrant of left breast in female, estrogen receptor positive ([facility]) 08/15/18:Left Lumpectomy: IDC grade 3, 1.9 cm, Posterior margin positive (Muscle) due to a transected satellite nodule 0.5 cm, 0/3 LN Neg, ER 80%, PR 70%, Ki-67 40%, HER-2 +3+ by IHC with heterogeneity T1cN0 Stage 1A  Posterior Margin: based on discussions with surgery, it is muscle and hence no further surgery is possible.  Plan:  1.Adjuvant Taxol Herceptin weekly x12 followed by Herceptin maintenance for 1 year 2.Adjuvant radiation  therapy 3.Followed by adjuvant antiestrogen therapy --------------------------------------------------------------------------------------------------------------------------------------------------------- Current treatment: Cycle 8 Taxol Herceptin Chemo toxicities: 1.Hair thinning and hair loss: I will give her a prescription for a wig 2.Intermittent diarrhea with abdominal cramps: Discussed with her about taking Imodium but she is reluctant to take any medication. 3.  Denies any neuropathy. 4.  Mild anemia: Being monitored  Patient wants to know if she can get fewer treatments.  I discussed with her the toxicity determines the number of treatments.  We will monitor her and make a decision in 2 weeks. Return to clinic weekly for chemo and every other week for follow-up with me   No orders of the defined types were placed in this encounter.  The patient has a good understanding of the overall plan. she agrees with it. she will call with any problems that may develop before the next visit here.  Nicholas Lose, MD 10/31/2018  Julious Oka Dorshimer am acting as scribe for Dr. Nicholas Lose.  I have reviewed the above documentation for accuracy and completeness, and I agree with the above.

## 2018-10-31 ENCOUNTER — Other Ambulatory Visit: Payer: Self-pay

## 2018-10-31 ENCOUNTER — Inpatient Hospital Stay: Payer: Federal, State, Local not specified - PPO

## 2018-10-31 ENCOUNTER — Inpatient Hospital Stay (HOSPITAL_BASED_OUTPATIENT_CLINIC_OR_DEPARTMENT_OTHER): Payer: Federal, State, Local not specified - PPO | Admitting: Hematology and Oncology

## 2018-10-31 DIAGNOSIS — Z170 Estrogen receptor positive status [ER+]: Principal | ICD-10-CM

## 2018-10-31 DIAGNOSIS — C50212 Malignant neoplasm of upper-inner quadrant of left female breast: Secondary | ICD-10-CM

## 2018-10-31 DIAGNOSIS — Z5112 Encounter for antineoplastic immunotherapy: Secondary | ICD-10-CM | POA: Diagnosis not present

## 2018-10-31 DIAGNOSIS — Z794 Long term (current) use of insulin: Secondary | ICD-10-CM

## 2018-10-31 DIAGNOSIS — R109 Unspecified abdominal pain: Secondary | ICD-10-CM

## 2018-10-31 DIAGNOSIS — R197 Diarrhea, unspecified: Secondary | ICD-10-CM

## 2018-10-31 DIAGNOSIS — Z5111 Encounter for antineoplastic chemotherapy: Secondary | ICD-10-CM | POA: Diagnosis not present

## 2018-10-31 DIAGNOSIS — Z7982 Long term (current) use of aspirin: Secondary | ICD-10-CM

## 2018-10-31 DIAGNOSIS — R5383 Other fatigue: Secondary | ICD-10-CM

## 2018-10-31 DIAGNOSIS — D649 Anemia, unspecified: Secondary | ICD-10-CM

## 2018-10-31 DIAGNOSIS — Z95828 Presence of other vascular implants and grafts: Secondary | ICD-10-CM

## 2018-10-31 DIAGNOSIS — Z79899 Other long term (current) drug therapy: Secondary | ICD-10-CM

## 2018-10-31 LAB — CMP (CANCER CENTER ONLY)
ALK PHOS: 65 U/L (ref 38–126)
ALT: 21 U/L (ref 0–44)
AST: 15 U/L (ref 15–41)
Albumin: 3.5 g/dL (ref 3.5–5.0)
Anion gap: 11 (ref 5–15)
BILIRUBIN TOTAL: 0.3 mg/dL (ref 0.3–1.2)
BUN: 13 mg/dL (ref 8–23)
CO2: 20 mmol/L — ABNORMAL LOW (ref 22–32)
Calcium: 9.2 mg/dL (ref 8.9–10.3)
Chloride: 105 mmol/L (ref 98–111)
Creatinine: 0.89 mg/dL (ref 0.44–1.00)
Glucose, Bld: 173 mg/dL — ABNORMAL HIGH (ref 70–99)
Potassium: 4.4 mmol/L (ref 3.5–5.1)
Sodium: 136 mmol/L (ref 135–145)
Total Protein: 6.7 g/dL (ref 6.5–8.1)

## 2018-10-31 LAB — CBC WITH DIFFERENTIAL (CANCER CENTER ONLY)
Abs Immature Granulocytes: 0.05 10*3/uL (ref 0.00–0.07)
BASOS PCT: 1 %
Basophils Absolute: 0 10*3/uL (ref 0.0–0.1)
Eosinophils Absolute: 0.3 10*3/uL (ref 0.0–0.5)
Eosinophils Relative: 5 %
HCT: 33.3 % — ABNORMAL LOW (ref 36.0–46.0)
Hemoglobin: 10.9 g/dL — ABNORMAL LOW (ref 12.0–15.0)
Immature Granulocytes: 1 %
Lymphocytes Relative: 38 %
Lymphs Abs: 2 10*3/uL (ref 0.7–4.0)
MCH: 27.5 pg (ref 26.0–34.0)
MCHC: 32.7 g/dL (ref 30.0–36.0)
MCV: 84.1 fL (ref 80.0–100.0)
MONOS PCT: 9 %
Monocytes Absolute: 0.5 10*3/uL (ref 0.1–1.0)
Neutro Abs: 2.4 10*3/uL (ref 1.7–7.7)
Neutrophils Relative %: 46 %
PLATELETS: 291 10*3/uL (ref 150–400)
RBC: 3.96 MIL/uL (ref 3.87–5.11)
RDW: 15.1 % (ref 11.5–15.5)
WBC Count: 5.2 10*3/uL (ref 4.0–10.5)
nRBC: 0 % (ref 0.0–0.2)

## 2018-10-31 MED ORDER — DIPHENHYDRAMINE HCL 50 MG/ML IJ SOLN
50.0000 mg | Freq: Once | INTRAMUSCULAR | Status: AC
  Administered 2018-10-31: 50 mg via INTRAVENOUS

## 2018-10-31 MED ORDER — SODIUM CHLORIDE 0.9% FLUSH
10.0000 mL | Freq: Once | INTRAVENOUS | Status: AC
  Administered 2018-10-31: 10 mL
  Filled 2018-10-31: qty 10

## 2018-10-31 MED ORDER — ACETAMINOPHEN 325 MG PO TABS
650.0000 mg | ORAL_TABLET | Freq: Once | ORAL | Status: AC
  Administered 2018-10-31: 650 mg via ORAL

## 2018-10-31 MED ORDER — SODIUM CHLORIDE 0.9 % IV SOLN
Freq: Once | INTRAVENOUS | Status: AC
  Administered 2018-10-31: 11:00:00 via INTRAVENOUS
  Filled 2018-10-31: qty 250

## 2018-10-31 MED ORDER — SODIUM CHLORIDE 0.9% FLUSH
10.0000 mL | INTRAVENOUS | Status: DC | PRN
  Administered 2018-10-31: 10 mL
  Filled 2018-10-31: qty 10

## 2018-10-31 MED ORDER — HEPARIN SOD (PORK) LOCK FLUSH 100 UNIT/ML IV SOLN
500.0000 [IU] | Freq: Once | INTRAVENOUS | Status: AC | PRN
  Administered 2018-10-31: 500 [IU]
  Filled 2018-10-31: qty 5

## 2018-10-31 MED ORDER — SODIUM CHLORIDE 0.9 % IV SOLN
20.0000 mg | Freq: Once | INTRAVENOUS | Status: AC
  Administered 2018-10-31: 20 mg via INTRAVENOUS
  Filled 2018-10-31: qty 2

## 2018-10-31 MED ORDER — TRASTUZUMAB CHEMO 150 MG IV SOLR
150.0000 mg | Freq: Once | INTRAVENOUS | Status: AC
  Administered 2018-10-31: 150 mg via INTRAVENOUS
  Filled 2018-10-31: qty 7.14

## 2018-10-31 MED ORDER — DIPHENHYDRAMINE HCL 50 MG/ML IJ SOLN
INTRAMUSCULAR | Status: AC
  Filled 2018-10-31: qty 1

## 2018-10-31 MED ORDER — ACETAMINOPHEN 325 MG PO TABS
ORAL_TABLET | ORAL | Status: AC
  Filled 2018-10-31: qty 2

## 2018-10-31 MED ORDER — SODIUM CHLORIDE 0.9 % IV SOLN
60.0000 mg/m2 | Freq: Once | INTRAVENOUS | Status: AC
  Administered 2018-10-31: 114 mg via INTRAVENOUS
  Filled 2018-10-31: qty 19

## 2018-10-31 NOTE — Patient Instructions (Signed)
[facility] Discharge Instructions for Patients Receiving Chemotherapy  Today you received the following chemotherapy agents :  Herceptin,  Taxol.  To help prevent nausea and vomiting after your treatment, we encourage you to take your nausea medication as prescribed.   If you develop nausea and vomiting that is not controlled by your nausea medication, call the clinic.   BELOW ARE SYMPTOMS THAT SHOULD BE REPORTED IMMEDIATELY:  *FEVER GREATER THAN 100.5 F  *CHILLS WITH OR WITHOUT FEVER  NAUSEA AND VOMITING THAT IS NOT CONTROLLED WITH YOUR NAUSEA MEDICATION  *UNUSUAL SHORTNESS OF BREATH  *UNUSUAL BRUISING OR BLEEDING  TENDERNESS IN MOUTH AND THROAT WITH OR WITHOUT PRESENCE OF ULCERS  *URINARY PROBLEMS  *BOWEL PROBLEMS  UNUSUAL RASH Items with * indicate a potential emergency and should be followed up as soon as possible.  Feel free to call the clinic should you have any questions or concerns. The clinic phone number is [phone].  Please show the CHEMO ALERT CARD at check-in to the Emergency Department and triage nurse.   

## 2018-10-31 NOTE — Assessment & Plan Note (Signed)
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  Plan:  1.Adjuvant Taxol Herceptin weekly x12 followed by Herceptin maintenance for 1 year 2.Adjuvant radiation therapy 3.Followed by adjuvant antiestrogen therapy --------------------------------------------------------------------------------------------------------------------------------------------------------- Current treatment: Cycle 8 Taxol Herceptin Chemo toxicities: 1.Hair thinning and hair loss: I will give her a prescription for a wig 2.Intermittent diarrhea with abdominal cramps: Discussed with her about taking Imodium but she is reluctant to take any medication. 3.  Denies any neuropathy. 4.  Mild anemia: Being monitored  Patient wants to know if she can get fewer treatments.  I discussed with her the toxicity determines the number of treatments.  We will monitor her and make a decision in 2 weeks. Return to clinic weekly for chemo and every other week for follow-up with me

## 2018-11-07 ENCOUNTER — Inpatient Hospital Stay: Payer: Federal, State, Local not specified - PPO

## 2018-11-07 ENCOUNTER — Other Ambulatory Visit: Payer: Self-pay

## 2018-11-07 VITALS — BP 148/68 | HR 75 | Temp 98.3°F | Resp 18

## 2018-11-07 DIAGNOSIS — C50212 Malignant neoplasm of upper-inner quadrant of left female breast: Secondary | ICD-10-CM

## 2018-11-07 DIAGNOSIS — Z170 Estrogen receptor positive status [ER+]: Principal | ICD-10-CM

## 2018-11-07 DIAGNOSIS — Z95828 Presence of other vascular implants and grafts: Secondary | ICD-10-CM

## 2018-11-07 LAB — CBC WITH DIFFERENTIAL (CANCER CENTER ONLY)
Abs Immature Granulocytes: 0.17 10*3/uL — ABNORMAL HIGH (ref 0.00–0.07)
BASOS ABS: 0.1 10*3/uL (ref 0.0–0.1)
BASOS PCT: 1 %
Eosinophils Absolute: 0.2 10*3/uL (ref 0.0–0.5)
Eosinophils Relative: 3 %
HCT: 34.5 % — ABNORMAL LOW (ref 36.0–46.0)
Hemoglobin: 11.3 g/dL — ABNORMAL LOW (ref 12.0–15.0)
Immature Granulocytes: 2 %
Lymphocytes Relative: 41 %
Lymphs Abs: 3 10*3/uL (ref 0.7–4.0)
MCH: 28 pg (ref 26.0–34.0)
MCHC: 32.8 g/dL (ref 30.0–36.0)
MCV: 85.4 fL (ref 80.0–100.0)
Monocytes Absolute: 0.8 10*3/uL (ref 0.1–1.0)
Monocytes Relative: 11 %
NRBC: 0 % (ref 0.0–0.2)
Neutro Abs: 3.3 10*3/uL (ref 1.7–7.7)
Neutrophils Relative %: 42 %
Platelet Count: 323 10*3/uL (ref 150–400)
RBC: 4.04 MIL/uL (ref 3.87–5.11)
RDW: 15.8 % — ABNORMAL HIGH (ref 11.5–15.5)
WBC: 7.5 10*3/uL (ref 4.0–10.5)

## 2018-11-07 LAB — CMP (CANCER CENTER ONLY)
ALT: 21 U/L (ref 0–44)
ANION GAP: 10 (ref 5–15)
AST: 16 U/L (ref 15–41)
Albumin: 3.7 g/dL (ref 3.5–5.0)
Alkaline Phosphatase: 69 U/L (ref 38–126)
BUN: 15 mg/dL (ref 8–23)
CO2: 21 mmol/L — ABNORMAL LOW (ref 22–32)
Calcium: 9.3 mg/dL (ref 8.9–10.3)
Chloride: 106 mmol/L (ref 98–111)
Creatinine: 0.86 mg/dL (ref 0.44–1.00)
GFR, Est AFR Am: >60 mL/min (ref >60)
GFR, Estimated: >60 mL/min (ref >60)
Glucose, Bld: 142 mg/dL — ABNORMAL HIGH (ref 70–99)
POTASSIUM: 4.2 mmol/L (ref 3.5–5.1)
Sodium: 137 mmol/L (ref 135–145)
Total Bilirubin: 0.3 mg/dL (ref 0.3–1.2)
Total Protein: 6.9 g/dL (ref 6.5–8.1)

## 2018-11-07 MED ORDER — DIPHENHYDRAMINE HCL 50 MG/ML IJ SOLN
50.0000 mg | Freq: Once | INTRAMUSCULAR | Status: AC
  Administered 2018-11-07: 50 mg via INTRAVENOUS

## 2018-11-07 MED ORDER — HEPARIN SOD (PORK) LOCK FLUSH 100 UNIT/ML IV SOLN
500.0000 [IU] | Freq: Once | INTRAVENOUS | Status: AC | PRN
  Administered 2018-11-07: 500 [IU]
  Filled 2018-11-07: qty 5

## 2018-11-07 MED ORDER — DIPHENHYDRAMINE HCL 50 MG/ML IJ SOLN
INTRAMUSCULAR | Status: AC
  Filled 2018-11-07: qty 1

## 2018-11-07 MED ORDER — SODIUM CHLORIDE 0.9% FLUSH
10.0000 mL | Freq: Once | INTRAVENOUS | Status: AC
  Administered 2018-11-07: 10 mL
  Filled 2018-11-07: qty 10

## 2018-11-07 MED ORDER — TRASTUZUMAB CHEMO 150 MG IV SOLR
150.0000 mg | Freq: Once | INTRAVENOUS | Status: AC
  Administered 2018-11-07: 150 mg via INTRAVENOUS
  Filled 2018-11-07: qty 7.14

## 2018-11-07 MED ORDER — SODIUM CHLORIDE 0.9% FLUSH
10.0000 mL | INTRAVENOUS | Status: DC | PRN
  Administered 2018-11-07: 10 mL
  Filled 2018-11-07: qty 10

## 2018-11-07 MED ORDER — SODIUM CHLORIDE 0.9 % IV SOLN
Freq: Once | INTRAVENOUS | Status: AC
  Administered 2018-11-07: 13:00:00 via INTRAVENOUS
  Filled 2018-11-07: qty 250

## 2018-11-07 MED ORDER — SODIUM CHLORIDE 0.9 % IV SOLN
20.0000 mg | Freq: Once | INTRAVENOUS | Status: AC
  Administered 2018-11-07: 20 mg via INTRAVENOUS
  Filled 2018-11-07: qty 2

## 2018-11-07 MED ORDER — ACETAMINOPHEN 325 MG PO TABS
650.0000 mg | ORAL_TABLET | Freq: Once | ORAL | Status: AC
  Administered 2018-11-07: 650 mg via ORAL

## 2018-11-07 MED ORDER — SODIUM CHLORIDE 0.9 % IV SOLN
60.0000 mg/m2 | Freq: Once | INTRAVENOUS | Status: AC
  Administered 2018-11-07: 114 mg via INTRAVENOUS
  Filled 2018-11-07: qty 19

## 2018-11-07 MED ORDER — ACETAMINOPHEN 325 MG PO TABS
ORAL_TABLET | ORAL | Status: AC
  Filled 2018-11-07: qty 2

## 2018-11-07 NOTE — Patient Instructions (Signed)
[facility] Discharge Instructions for Patients Receiving Chemotherapy  Today you received the following chemotherapy agents :  Herceptin,  Taxol.  To help prevent nausea and vomiting after your treatment, we encourage you to take your nausea medication as prescribed.   If you develop nausea and vomiting that is not controlled by your nausea medication, call the clinic.   BELOW ARE SYMPTOMS THAT SHOULD BE REPORTED IMMEDIATELY:  *FEVER GREATER THAN 100.5 F  *CHILLS WITH OR WITHOUT FEVER  NAUSEA AND VOMITING THAT IS NOT CONTROLLED WITH YOUR NAUSEA MEDICATION  *UNUSUAL SHORTNESS OF BREATH  *UNUSUAL BRUISING OR BLEEDING  TENDERNESS IN MOUTH AND THROAT WITH OR WITHOUT PRESENCE OF ULCERS  *URINARY PROBLEMS  *BOWEL PROBLEMS  UNUSUAL RASH Items with * indicate a potential emergency and should be followed up as soon as possible.  Feel free to call the clinic should you have any questions or concerns. The clinic phone number is [phone].  Please show the CHEMO ALERT CARD at check-in to the Emergency Department and triage nurse.   

## 2018-11-14 ENCOUNTER — Inpatient Hospital Stay: Payer: Federal, State, Local not specified - PPO

## 2018-11-14 ENCOUNTER — Other Ambulatory Visit: Payer: Self-pay | Admitting: *Deleted

## 2018-11-14 ENCOUNTER — Encounter: Payer: Self-pay | Admitting: Adult Health

## 2018-11-14 ENCOUNTER — Inpatient Hospital Stay: Payer: Federal, State, Local not specified - PPO | Attending: Hematology and Oncology

## 2018-11-14 ENCOUNTER — Other Ambulatory Visit: Payer: Self-pay

## 2018-11-14 ENCOUNTER — Inpatient Hospital Stay (HOSPITAL_BASED_OUTPATIENT_CLINIC_OR_DEPARTMENT_OTHER): Payer: Federal, State, Local not specified - PPO | Admitting: Adult Health

## 2018-11-14 VITALS — BP 148/84 | HR 91 | Temp 98.8°F | Resp 18 | Ht 65.0 in | Wt 163.4 lb

## 2018-11-14 DIAGNOSIS — E785 Hyperlipidemia, unspecified: Secondary | ICD-10-CM

## 2018-11-14 DIAGNOSIS — G629 Polyneuropathy, unspecified: Secondary | ICD-10-CM

## 2018-11-14 DIAGNOSIS — M1990 Unspecified osteoarthritis, unspecified site: Secondary | ICD-10-CM | POA: Diagnosis not present

## 2018-11-14 DIAGNOSIS — Z170 Estrogen receptor positive status [ER+]: Principal | ICD-10-CM

## 2018-11-14 DIAGNOSIS — Z5111 Encounter for antineoplastic chemotherapy: Secondary | ICD-10-CM

## 2018-11-14 DIAGNOSIS — Z90710 Acquired absence of both cervix and uterus: Secondary | ICD-10-CM

## 2018-11-14 DIAGNOSIS — C50212 Malignant neoplasm of upper-inner quadrant of left female breast: Secondary | ICD-10-CM

## 2018-11-14 DIAGNOSIS — I739 Peripheral vascular disease, unspecified: Secondary | ICD-10-CM | POA: Insufficient documentation

## 2018-11-14 DIAGNOSIS — K219 Gastro-esophageal reflux disease without esophagitis: Secondary | ICD-10-CM | POA: Diagnosis not present

## 2018-11-14 DIAGNOSIS — Z90722 Acquired absence of ovaries, bilateral: Secondary | ICD-10-CM | POA: Diagnosis not present

## 2018-11-14 DIAGNOSIS — R0602 Shortness of breath: Secondary | ICD-10-CM | POA: Diagnosis not present

## 2018-11-14 DIAGNOSIS — E119 Type 2 diabetes mellitus without complications: Secondary | ICD-10-CM

## 2018-11-14 DIAGNOSIS — Z5112 Encounter for antineoplastic immunotherapy: Secondary | ICD-10-CM

## 2018-11-14 DIAGNOSIS — Z95828 Presence of other vascular implants and grafts: Secondary | ICD-10-CM

## 2018-11-14 DIAGNOSIS — Z794 Long term (current) use of insulin: Secondary | ICD-10-CM

## 2018-11-14 DIAGNOSIS — I10 Essential (primary) hypertension: Secondary | ICD-10-CM

## 2018-11-14 DIAGNOSIS — Z9221 Personal history of antineoplastic chemotherapy: Secondary | ICD-10-CM | POA: Diagnosis not present

## 2018-11-14 DIAGNOSIS — R5383 Other fatigue: Secondary | ICD-10-CM | POA: Insufficient documentation

## 2018-11-14 DIAGNOSIS — D649 Anemia, unspecified: Secondary | ICD-10-CM

## 2018-11-14 LAB — CMP (CANCER CENTER ONLY)
ALT: 25 U/L (ref 0–44)
AST: 17 U/L (ref 15–41)
Albumin: 3.5 g/dL (ref 3.5–5.0)
Alkaline Phosphatase: 72 U/L (ref 38–126)
Anion gap: 10 (ref 5–15)
BUN: 11 mg/dL (ref 8–23)
CO2: 23 mmol/L (ref 22–32)
Calcium: 9.6 mg/dL (ref 8.9–10.3)
Chloride: 101 mmol/L (ref 98–111)
Creatinine: 0.85 mg/dL (ref 0.44–1.00)
GFR, Est AFR Am: >60 mL/min (ref >60)
GFR, Estimated: >60 mL/min (ref >60)
Glucose, Bld: 134 mg/dL — ABNORMAL HIGH (ref 70–99)
Potassium: 4.2 mmol/L (ref 3.5–5.1)
Sodium: 134 mmol/L — ABNORMAL LOW (ref 135–145)
Total Bilirubin: 0.3 mg/dL (ref 0.3–1.2)
Total Protein: 7 g/dL (ref 6.5–8.1)

## 2018-11-14 LAB — CBC WITH DIFFERENTIAL (CANCER CENTER ONLY)
Abs Immature Granulocytes: 0.06 10*3/uL (ref 0.00–0.07)
Basophils Absolute: 0 10*3/uL (ref 0.0–0.1)
Basophils Relative: 1 %
Eosinophils Absolute: 0.1 10*3/uL (ref 0.0–0.5)
Eosinophils Relative: 2 %
HCT: 33.2 % — ABNORMAL LOW (ref 36.0–46.0)
Hemoglobin: 10.9 g/dL — ABNORMAL LOW (ref 12.0–15.0)
Immature Granulocytes: 1 %
Lymphocytes Relative: 34 %
Lymphs Abs: 2 10*3/uL (ref 0.7–4.0)
MCH: 27.6 pg (ref 26.0–34.0)
MCHC: 32.8 g/dL (ref 30.0–36.0)
MCV: 84.1 fL (ref 80.0–100.0)
Monocytes Absolute: 0.7 10*3/uL (ref 0.1–1.0)
Monocytes Relative: 11 %
Neutro Abs: 3 10*3/uL (ref 1.7–7.7)
Neutrophils Relative %: 51 %
Platelet Count: 270 10*3/uL (ref 150–400)
RBC: 3.95 MIL/uL (ref 3.87–5.11)
RDW: 15.7 % — ABNORMAL HIGH (ref 11.5–15.5)
WBC Count: 5.9 10*3/uL (ref 4.0–10.5)
nRBC: 0 % (ref 0.0–0.2)

## 2018-11-14 MED ORDER — SODIUM CHLORIDE 0.9% FLUSH
10.0000 mL | Freq: Once | INTRAVENOUS | Status: AC
  Administered 2018-11-14: 10 mL
  Filled 2018-11-14: qty 10

## 2018-11-14 MED ORDER — SODIUM CHLORIDE 0.9 % IV SOLN
Freq: Once | INTRAVENOUS | Status: AC
  Administered 2018-11-14: 13:00:00 via INTRAVENOUS
  Filled 2018-11-14: qty 250

## 2018-11-14 MED ORDER — ACETAMINOPHEN 325 MG PO TABS
650.0000 mg | ORAL_TABLET | Freq: Once | ORAL | Status: AC
  Administered 2018-11-14: 13:00:00 650 mg via ORAL

## 2018-11-14 MED ORDER — HEPARIN SOD (PORK) LOCK FLUSH 100 UNIT/ML IV SOLN
500.0000 [IU] | Freq: Once | INTRAVENOUS | Status: AC | PRN
  Administered 2018-11-14: 500 [IU]
  Filled 2018-11-14: qty 5

## 2018-11-14 MED ORDER — FAMOTIDINE IN NACL 20-0.9 MG/50ML-% IV SOLN: 20.0000 mg | Freq: Once | INTRAVENOUS | Status: DC

## 2018-11-14 MED ORDER — ACETAMINOPHEN 325 MG PO TABS
ORAL_TABLET | ORAL | Status: AC
  Filled 2018-11-14: qty 2

## 2018-11-14 MED ORDER — SODIUM CHLORIDE 0.9 % IV SOLN
20.0000 mg | Freq: Once | INTRAVENOUS | Status: AC
  Administered 2018-11-14: 13:00:00 20 mg via INTRAVENOUS
  Filled 2018-11-14: qty 2

## 2018-11-14 MED ORDER — SODIUM CHLORIDE 0.9 % IV SOLN
60.0000 mg/m2 | Freq: Once | INTRAVENOUS | Status: AC
  Administered 2018-11-14: 114 mg via INTRAVENOUS
  Filled 2018-11-14: qty 19

## 2018-11-14 MED ORDER — DIPHENHYDRAMINE HCL 50 MG/ML IJ SOLN
INTRAMUSCULAR | Status: AC
  Filled 2018-11-14: qty 1

## 2018-11-14 MED ORDER — TRASTUZUMAB CHEMO 150 MG IV SOLR
150.0000 mg | Freq: Once | INTRAVENOUS | Status: AC
  Administered 2018-11-14: 14:00:00 150 mg via INTRAVENOUS
  Filled 2018-11-14: qty 7.14

## 2018-11-14 MED ORDER — DIPHENHYDRAMINE HCL 50 MG/ML IJ SOLN
50.0000 mg | Freq: Once | INTRAMUSCULAR | Status: AC
  Administered 2018-11-14: 13:00:00 50 mg via INTRAVENOUS

## 2018-11-14 MED ORDER — SODIUM CHLORIDE 0.9% FLUSH
10.0000 mL | INTRAVENOUS | Status: DC | PRN
  Administered 2018-11-14: 10 mL
  Filled 2018-11-14: qty 10

## 2018-11-14 NOTE — Assessment & Plan Note (Addendum)
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  Plan:  1.Adjuvant Taxol Herceptin weekly x12 followed by Herceptin maintenance for 1 year 2.Adjuvant radiation therapy 3.Followed by adjuvant antiestrogen therapy --------------------------------------------------------------------------------------------------------------------------------------------------------- Current treatment: Cycle 10 Taxol Herceptin  Chemo toxicities: 1.Hair thinning and hair loss: Has wig prescription 2.Mild anemia: stable  Covid precautions reviewed.  No peripheral neuropathy.  Will return weekly for labs/treatment, and will see Dr. Lindi Adie in 2 weeks for her final chemotherapy.  She and I reviewed her upcoming therapy plan.  She has appointment for radiation oncology on 12/28/2018.

## 2018-11-14 NOTE — Progress Notes (Signed)
[facility] Follow up:    [name], MD [address] [address]   DIAGNOSIS: Cancer Staging Malignant neoplasm of upper-inner quadrant of left breast in female, estrogen receptor positive ([place]) Staging form: Breast, AJCC 8th Edition - Pathologic: Stage IA (pT1b, pN0, cM0, G3, ER+, PR+, HER2+) - Signed by Gardenia Phlegm, NP on 08/24/2018   SUMMARY OF ONCOLOGIC HISTORY:   Malignant neoplasm of upper-inner quadrant of left breast in female, estrogen receptor positive ([place])   07/18/2018 Initial Diagnosis    Screening detected left breast mass at 10 o'clock position 1.8 cm axilla negative, biopsy revealed grade 3 IDC ER 80%, PR 70%, Ki-67 40%, HER-2 +3+ by IHC with heterogeneity, T1c N0 stage Ia clinical stage    08/15/2018 Surgery    Left Lumpectomy: IDC grade 3, 1.9 cm, Posterior margin positive (Muscle) due to a transected satellite nodule 0.5 cm, 0/3 LN Neg, ER 80%, PR 70%, Ki-67 40%, HER-2 +3+ by IHC with heterogeneity T1cN0 Stage 1A    08/24/2018 Cancer Staging    Staging form: Breast, AJCC 8th Edition - Pathologic: Stage IA (pT1b, pN0, cM0, G3, ER+, PR+, HER2+) - Signed by Gardenia Phlegm, NP on 08/24/2018    09/09/2018 -  Chemotherapy    The patient had trastuzumab (HERCEPTIN) 300 mg in sodium chloride 0.9 % 250 mL chemo infusion, 294 mg, Intravenous,  Once, 3 of 16 cycles Administration: 300 mg (09/09/2018), 150 mg (09/19/2018), 150 mg (10/10/2018), 150 mg (09/26/2018), 150 mg (10/03/2018), 150 mg (10/17/2018), 150 mg (10/24/2018), 150 mg (10/31/2018), 150 mg (11/07/2018) PACLitaxel (TAXOL) 150 mg in sodium chloride 0.9 % 250 mL chemo infusion (</= 50m/m2), 80 mg/m2 = 150 mg, Intravenous,  Once, 3 of 3 cycles Dose modification: 60 mg/m2 (original dose 80 mg/m2, Cycle 2, Reason: Dose not tolerated) Administration: 150 mg (09/09/2018), 150 mg (09/19/2018), 150 mg (10/10/2018), 150 mg (09/26/2018), 150 mg (10/03/2018), 114 mg  (10/17/2018), 114 mg (10/24/2018), 114 mg (10/31/2018), 114 mg (11/07/2018)  for chemotherapy treatment.      CURRENT THERAPY: Taxol/Herceptin week 10  INTERVAL HISTORY: SDoral89y.o. female returns for evaluation prior to receiving her tenth cycle of adjuvant Taxol/Herceptin.  She is tolerating it well and has no issues with it.  She denies peripheral neuropathy.     Patient Active Problem List   Diagnosis Date Noted  . Port-A-Cath in place 09/19/2018  . Malignant neoplasm of upper-inner quadrant of left breast in female, estrogen receptor positive ([facility] 07/27/2018  . Dyspnea on exertion 10/21/2016  . Type 2 diabetes mellitus without complication, without long-term current use of insulin ([facility] 10/21/2016  . Dyslipidemia 10/21/2016    is allergic to penicillins; cephalexin; and statins.  MEDICAL HISTORY: Past Medical History:  Diagnosis Date  . GERD (gastroesophageal reflux disease)   . Hypertension   . Mixed hyperlipidemia   . OA (osteoarthritis)    knee right  . Peripheral neuropathy   . PVD (peripheral vascular disease) ([facility]   . Type 2 diabetes mellitus treated with insulin ([facility]    endocrinologist--- dr dProvidence Crosbypatel    SURGICAL HISTORY: Past Surgical History:  Procedure Laterality Date  . BREAST BIOPSY Bilateral 2000   benign  . BREAST LUMPECTOMY WITH RADIOACTIVE SEED AND SENTINEL LYMPH NODE BIOPSY Left 08/15/2018   Procedure: LEFT BREAST LUMPECTOMY WITH RADIOACTIVE SEED AND LEFT AXILLARY DEEP SENTINEL LYMPH NODE BIOPSY INJECT BLUE DYE LEFT BREAST;  Surgeon: IFanny Skates MD;  Location: [facility];  Service: General;  Laterality: Left;  . PORTACATH PLACEMENT Right 08/15/2018   Procedure: INSERTION PORT-A-CATH WITH ULTRASOUND;  Surgeon: Fanny Skates, MD;  Location: [facility];  Service: General;  Laterality: Right;  . SHOULDER ARTHROSCOPY Right 06-16-2002   dr graves  . SHOULDER OPEN ROTATOR CUFF REPAIR Right  07-28-2000    dr Berenice Primas  . VAGINAL HYSTERECTOMY  1992   with BSO  . WRIST SURGERY  2007    SOCIAL HISTORY: Social History   Socioeconomic History  . Marital status: Married    Spouse name: Not on file  . Number of children: 4  . Years of education: Not on file  . Highest education level: Not on file  Occupational History  . Not on file  Social Needs  . Financial resource strain: Not on file  . Food insecurity:    Worry: Not on file    Inability: Not on file  . Transportation needs:    Medical: Not on file    Non-medical: Not on file  Tobacco Use  . Smoking status: Never Smoker  . Smokeless tobacco: Never Used  Substance and Sexual Activity  . Alcohol use: Never    Frequency: Never  . Drug use: Never  . Sexual activity: Not on file  Lifestyle  . Physical activity:    Days per week: Not on file    Minutes per session: Not on file  . Stress: Not on file  Relationships  . Social connections:    Talks on phone: Not on file    Gets together: Not on file    Attends religious service: Not on file    Active member of club or organization: Not on file    Attends meetings of clubs or organizations: Not on file    Relationship status: Not on file  . Intimate partner violence:    Fear of current or ex partner: Not on file    Emotionally abused: Not on file    Physically abused: Not on file    Forced sexual activity: Not on file  Other Topics Concern  . Not on file  Social History Narrative   Lives with husband and son.      FAMILY HISTORY: Family History  Problem Relation Age of Onset  . Hypertension Mother   . CVA Mother   . Heart disease Mother   . Asthma Father   . Hypertension Sister   . Diabetes Sister   . Heart disease Brother   . Hypertension Sister   . Diabetes Sister   . Hypertension Sister   . Diabetes Sister   . Breast cancer Neg Hx     Review of Systems  Constitutional: Positive for fatigue. Negative for appetite change, chills and fever.   HENT:   Negative for hearing loss, lump/mass and mouth sores.   Eyes: Negative for eye problems and icterus.  Respiratory: Negative for chest tightness, cough and shortness of breath.   Cardiovascular: Negative for chest pain and leg swelling.  Gastrointestinal: Negative for abdominal distention, abdominal pain, blood in stool, constipation, diarrhea, nausea and vomiting.  Endocrine: Negative for hot flashes.  Skin: Negative for itching and rash.  Neurological: Negative for dizziness, extremity weakness, headaches and numbness.  Hematological: Negative for adenopathy. Does not bruise/bleed easily.  Psychiatric/Behavioral: Negative for depression. The patient is not nervous/anxious.       PHYSICAL EXAMINATION  ECOG PERFORMANCE STATUS: 1 - Symptomatic but completely ambulatory  Vitals:   11/14/18 1138  BP: (!) 148/84  Pulse: 91  Resp: 18  Temp: 98.8 F (37.1 C)  SpO2: 100%    Physical Exam Constitutional:      General: She is not in acute distress.    Appearance: Normal appearance.  HENT:     Head: Atraumatic.     Mouth/Throat:     Mouth: Mucous membranes are dry.     Pharynx: No oropharyngeal exudate.  Eyes:     General: No scleral icterus.    Pupils: Pupils are equal, round, and reactive to light.  Neck:     Musculoskeletal: Neck supple.  Cardiovascular:     Rate and Rhythm: Normal rate and regular rhythm.     Pulses: Normal pulses.     Heart sounds: Normal heart sounds.  Pulmonary:     Effort: Pulmonary effort is normal. No respiratory distress.     Breath sounds: Normal breath sounds.  Abdominal:     General: Abdomen is flat. There is no distension.     Palpations: Abdomen is soft.     Tenderness: There is no abdominal tenderness.  Musculoskeletal:        General: No swelling.  Lymphadenopathy:     Cervical: No cervical adenopathy.  Skin:    General: Skin is warm and dry.     Capillary Refill: Capillary refill takes less than 2 seconds.     Findings: No  rash.  Neurological:     General: No focal deficit present.     Mental Status: She is alert.  Psychiatric:        Mood and Affect: Mood normal.        Behavior: Behavior normal.     LABORATORY DATA:  CBC    Component Value Date/Time   WBC 5.9 11/14/2018 1124   WBC 6.2 07/15/2018 1003   RBC 3.95 11/14/2018 1124   HGB 10.9 (L) 11/14/2018 1124   HCT 33.2 (L) 11/14/2018 1124   PLT 270 11/14/2018 1124   MCV 84.1 11/14/2018 1124   MCH 27.6 11/14/2018 1124   MCHC 32.8 11/14/2018 1124   RDW 15.7 (H) 11/14/2018 1124   LYMPHSABS 2.0 11/14/2018 1124   MONOABS 0.7 11/14/2018 1124   EOSABS 0.1 11/14/2018 1124   BASOSABS 0.0 11/14/2018 1124    CMP     Component Value Date/Time   NA 134 (L) 11/14/2018 1124   K 4.2 11/14/2018 1124   CL 101 11/14/2018 1124   CO2 23 11/14/2018 1124   GLUCOSE 134 (H) 11/14/2018 1124   BUN 11 11/14/2018 1124   CREATININE 0.85 11/14/2018 1124   CALCIUM 9.6 11/14/2018 1124   PROT 7.0 11/14/2018 1124   ALBUMIN 3.5 11/14/2018 1124   AST 17 11/14/2018 1124   ALT 25 11/14/2018 1124   ALKPHOS 72 11/14/2018 1124   BILITOT 0.3 11/14/2018 1124   GFRNONAA >60 11/14/2018 1124   GFRAA >60 11/14/2018 1124      ASSESSMENT and PLAN:   Malignant neoplasm of upper-inner quadrant of left breast in female, estrogen receptor positive ([facility]) 08/15/18:Left Lumpectomy: IDC grade 3, 1.9 cm, Posterior margin positive (Muscle) due to a transected satellite nodule 0.5 cm, 0/3 LN Neg, ER 80%, PR 70%, Ki-67 40%, HER-2 +3+ by IHC with heterogeneity T1cN0 Stage 1A  Posterior Margin: based on discussions with surgery, it is muscle and hence no further surgery is possible.  Plan:  1.Adjuvant Taxol Herceptin weekly x12 followed by Herceptin maintenance for 1 year 2.Adjuvant radiation therapy 3.Followed by adjuvant antiestrogen  therapy ---------------------------------------------------------------------------------------------------------------------------------------------------------  Current treatment: Cycle 10 Taxol Herceptin  Chemo toxicities: 1.Hair thinning and hair loss: Has wig prescription 2.Mild anemia: stable  Covid precautions reviewed.  No peripheral neuropathy.  Will return weekly for labs/treatment, and will see Dr. Lindi Adie in 2 weeks for her final chemotherapy.  She and I reviewed her upcoming therapy plan.  She has appointment for radiation oncology on 12/28/2018.      All questions were answered. The patient knows to call the clinic with any problems, questions or concerns. We can certainly see the patient much sooner if necessary.   A total of (20) minutes of face-to-face time was spent with this patient with greater than 50% of that time in counseling and care-coordination.  This note was electronically signed. Scot Dock, NP 11/14/2018

## 2018-11-14 NOTE — Patient Instructions (Signed)
[facility] Discharge Instructions for Patients Receiving Chemotherapy  Today you received the following chemotherapy agents:  Herceptin and Taxol.  To help prevent nausea and vomiting after your treatment, we encourage you to take your nausea medication as directed.   If you develop nausea and vomiting that is not controlled by your nausea medication, call the clinic.   BELOW ARE SYMPTOMS THAT SHOULD BE REPORTED IMMEDIATELY:  *FEVER GREATER THAN 100.5 F  *CHILLS WITH OR WITHOUT FEVER  NAUSEA AND VOMITING THAT IS NOT CONTROLLED WITH YOUR NAUSEA MEDICATION  *UNUSUAL SHORTNESS OF BREATH  *UNUSUAL BRUISING OR BLEEDING  TENDERNESS IN MOUTH AND THROAT WITH OR WITHOUT PRESENCE OF ULCERS  *URINARY PROBLEMS  *BOWEL PROBLEMS  UNUSUAL RASH Items with * indicate a potential emergency and should be followed up as soon as possible.  Feel free to call the clinic should you have any questions or concerns. The clinic phone number is [phone].  Please show the CHEMO ALERT CARD at check-in to the Emergency Department and triage nurse.   

## 2018-11-21 ENCOUNTER — Inpatient Hospital Stay: Payer: Federal, State, Local not specified - PPO

## 2018-11-21 ENCOUNTER — Other Ambulatory Visit: Payer: Self-pay

## 2018-11-21 VITALS — BP 140/67 | HR 83 | Temp 97.8°F | Resp 18

## 2018-11-21 DIAGNOSIS — C50212 Malignant neoplasm of upper-inner quadrant of left female breast: Secondary | ICD-10-CM

## 2018-11-21 DIAGNOSIS — Z170 Estrogen receptor positive status [ER+]: Principal | ICD-10-CM

## 2018-11-21 DIAGNOSIS — Z95828 Presence of other vascular implants and grafts: Secondary | ICD-10-CM

## 2018-11-21 LAB — CMP (CANCER CENTER ONLY)
ALT: 20 U/L (ref 0–44)
AST: 16 U/L (ref 15–41)
Albumin: 3.5 g/dL (ref 3.5–5.0)
Alkaline Phosphatase: 79 U/L (ref 38–126)
Anion gap: 10 (ref 5–15)
BUN: 13 mg/dL (ref 8–23)
CO2: 22 mmol/L (ref 22–32)
Calcium: 9.4 mg/dL (ref 8.9–10.3)
Chloride: 103 mmol/L (ref 98–111)
Creatinine: 0.88 mg/dL (ref 0.44–1.00)
GFR, Est AFR Am: >60 mL/min (ref >60)
GFR, Estimated: >60 mL/min (ref >60)
Glucose, Bld: 186 mg/dL — ABNORMAL HIGH (ref 70–99)
Potassium: 4.3 mmol/L (ref 3.5–5.1)
Sodium: 135 mmol/L (ref 135–145)
Total Bilirubin: 0.3 mg/dL (ref 0.3–1.2)
Total Protein: 6.7 g/dL (ref 6.5–8.1)

## 2018-11-21 LAB — CBC WITH DIFFERENTIAL (CANCER CENTER ONLY)
Abs Immature Granulocytes: 0.07 10*3/uL (ref 0.00–0.07)
Basophils Absolute: 0 10*3/uL (ref 0.0–0.1)
Basophils Relative: 1 %
Eosinophils Absolute: 0.1 10*3/uL (ref 0.0–0.5)
Eosinophils Relative: 1 %
HCT: 33.2 % — ABNORMAL LOW (ref 36.0–46.0)
Hemoglobin: 10.9 g/dL — ABNORMAL LOW (ref 12.0–15.0)
Immature Granulocytes: 1 %
Lymphocytes Relative: 26 %
Lymphs Abs: 1.6 10*3/uL (ref 0.7–4.0)
MCH: 28.2 pg (ref 26.0–34.0)
MCHC: 32.8 g/dL (ref 30.0–36.0)
MCV: 86 fL (ref 80.0–100.0)
Monocytes Absolute: 0.5 10*3/uL (ref 0.1–1.0)
Monocytes Relative: 9 %
Neutro Abs: 3.7 10*3/uL (ref 1.7–7.7)
Neutrophils Relative %: 62 %
Platelet Count: 297 10*3/uL (ref 150–400)
RBC: 3.86 MIL/uL — ABNORMAL LOW (ref 3.87–5.11)
RDW: 15.9 % — ABNORMAL HIGH (ref 11.5–15.5)
WBC Count: 6 10*3/uL (ref 4.0–10.5)
nRBC: 0 % (ref 0.0–0.2)

## 2018-11-21 MED ORDER — HEPARIN SOD (PORK) LOCK FLUSH 100 UNIT/ML IV SOLN
500.0000 [IU] | Freq: Once | INTRAVENOUS | Status: AC | PRN
  Administered 2018-11-21: 15:00:00 500 [IU]
  Filled 2018-11-21: qty 5

## 2018-11-21 MED ORDER — SODIUM CHLORIDE 0.9 % IV SOLN
Freq: Once | INTRAVENOUS | Status: AC
  Administered 2018-11-21: 12:00:00 via INTRAVENOUS
  Filled 2018-11-21: qty 250

## 2018-11-21 MED ORDER — TRASTUZUMAB CHEMO 150 MG IV SOLR
150.0000 mg | Freq: Once | INTRAVENOUS | Status: AC
  Administered 2018-11-21: 150 mg via INTRAVENOUS
  Filled 2018-11-21: qty 7.14

## 2018-11-21 MED ORDER — ACETAMINOPHEN 325 MG PO TABS
ORAL_TABLET | ORAL | Status: AC
  Filled 2018-11-21: qty 2

## 2018-11-21 MED ORDER — SODIUM CHLORIDE 0.9% FLUSH
10.0000 mL | INTRAVENOUS | Status: DC | PRN
  Administered 2018-11-21: 10 mL
  Filled 2018-11-21: qty 10

## 2018-11-21 MED ORDER — SODIUM CHLORIDE 0.9 % IV SOLN
20.0000 mg | Freq: Once | INTRAVENOUS | Status: AC
  Administered 2018-11-21: 20 mg via INTRAVENOUS
  Filled 2018-11-21: qty 2

## 2018-11-21 MED ORDER — DIPHENHYDRAMINE HCL 50 MG/ML IJ SOLN
50.0000 mg | Freq: Once | INTRAMUSCULAR | Status: AC
  Administered 2018-11-21: 50 mg via INTRAVENOUS

## 2018-11-21 MED ORDER — ACETAMINOPHEN 325 MG PO TABS
650.0000 mg | ORAL_TABLET | Freq: Once | ORAL | Status: AC
  Administered 2018-11-21: 650 mg via ORAL

## 2018-11-21 MED ORDER — DIPHENHYDRAMINE HCL 50 MG/ML IJ SOLN
INTRAMUSCULAR | Status: AC
  Filled 2018-11-21: qty 1

## 2018-11-21 MED ORDER — SODIUM CHLORIDE 0.9% FLUSH
10.0000 mL | Freq: Once | INTRAVENOUS | Status: AC
  Administered 2018-11-21: 10 mL
  Filled 2018-11-21: qty 10

## 2018-11-21 MED ORDER — SODIUM CHLORIDE 0.9 % IV SOLN
60.0000 mg/m2 | Freq: Once | INTRAVENOUS | Status: AC
  Administered 2018-11-21: 14:00:00 114 mg via INTRAVENOUS
  Filled 2018-11-21: qty 19

## 2018-11-21 NOTE — Patient Instructions (Signed)
[facility] Discharge Instructions for Patients Receiving Chemotherapy  Today you received the following chemotherapy agents:  Herceptin and Taxol.  To help prevent nausea and vomiting after your treatment, we encourage you to take your nausea medication as directed.   If you develop nausea and vomiting that is not controlled by your nausea medication, call the clinic.   BELOW ARE SYMPTOMS THAT SHOULD BE REPORTED IMMEDIATELY:  *FEVER GREATER THAN 100.5 F  *CHILLS WITH OR WITHOUT FEVER  NAUSEA AND VOMITING THAT IS NOT CONTROLLED WITH YOUR NAUSEA MEDICATION  *UNUSUAL SHORTNESS OF BREATH  *UNUSUAL BRUISING OR BLEEDING  TENDERNESS IN MOUTH AND THROAT WITH OR WITHOUT PRESENCE OF ULCERS  *URINARY PROBLEMS  *BOWEL PROBLEMS  UNUSUAL RASH Items with * indicate a potential emergency and should be followed up as soon as possible.  Feel free to call the clinic should you have any questions or concerns. The clinic phone number is [phone].  Please show the CHEMO ALERT CARD at check-in to the Emergency Department and triage nurse.   

## 2018-11-25 NOTE — Progress Notes (Signed)
Patient Care Team: [name], MD as PCP - General (Family Medicine) Nicholas Lose, MD as Consulting Physician (Hematology and Oncology) Delice Bison, Charlestine Massed, NP as Nurse Practitioner (Hematology and Oncology) Fanny Skates, MD as Consulting Physician (General Surgery)  DIAGNOSIS:    ICD-10-CM   1. Malignant neoplasm of upper-inner quadrant of left breast in female, estrogen receptor positive ([place]) C50.212    Z17.0     SUMMARY OF ONCOLOGIC HISTORY:   Malignant neoplasm of upper-inner quadrant of left breast in female, estrogen receptor positive ([place])   07/18/2018 Initial Diagnosis    Screening detected left breast mass at 10 o'clock position 1.8 cm axilla negative, biopsy revealed grade 3 IDC ER 80%, PR 70%, Ki-67 40%, HER-2 +3+ by IHC with heterogeneity, T1c N0 stage Ia clinical stage    08/15/2018 Surgery    Left Lumpectomy: IDC grade 3, 1.9 cm, Posterior margin positive (Muscle) due to a transected satellite nodule 0.5 cm, 0/3 LN Neg, ER 80%, PR 70%, Ki-67 40%, HER-2 +3+ by IHC with heterogeneity T1cN0 Stage 1A    08/24/2018 Cancer Staging    Staging form: Breast, AJCC 8th Edition - Pathologic: Stage IA (pT1b, pN0, cM0, G3, ER+, PR+, HER2+) - Signed by Gardenia Phlegm, NP on 08/24/2018    09/09/2018 -  Chemotherapy    The patient had trastuzumab (HERCEPTIN) 300 mg in sodium chloride 0.9 % 250 mL chemo infusion, 294 mg, Intravenous,  Once, 3 of 16 cycles Administration: 300 mg (09/09/2018), 150 mg (09/19/2018), 150 mg (10/10/2018), 150 mg (09/26/2018), 150 mg (10/03/2018), 150 mg (10/17/2018), 150 mg (10/24/2018), 150 mg (10/31/2018), 150 mg (11/07/2018), 150 mg (11/14/2018), 150 mg (11/21/2018) PACLitaxel (TAXOL) 150 mg in sodium chloride 0.9 % 250 mL chemo infusion (</= 49m/m2), 80 mg/m2 = 150 mg, Intravenous,  Once, 3 of 3 cycles Dose modification: 60 mg/m2 (original dose 80 mg/m2, Cycle 2, Reason: Dose not tolerated) Administration: 150 mg (09/09/2018), 150 mg (09/19/2018), 150  mg (10/10/2018), 150 mg (09/26/2018), 150 mg (10/03/2018), 114 mg (10/17/2018), 114 mg (10/24/2018), 114 mg (10/31/2018), 114 mg (11/07/2018), 114 mg (11/14/2018), 114 mg (11/21/2018)  for chemotherapy treatment.      CHIEF COMPLIANT: Cycle 12 Taxol and Herceptin  INTERVAL HISTORY: Gloria Rameshchandra PPreslaris a 73y.o. with above-mentioned history of left breast cancer treated with lumpectomy who is currently on adjuvant chemotherapy with weekly Taxol and Herceptin. She presents to the clinic alone today for treatment.  She denies any neuropathy.  Denies any nausea or vomiting.  She does have fatigue.  REVIEW OF SYSTEMS:   Constitutional: Fatigue Eyes: Denies blurriness of vision Ears, nose, mouth, throat, and face: Denies mucositis or sore throat Respiratory: Denies cough, dyspnea or wheezes Cardiovascular: Denies palpitation, chest discomfort Gastrointestinal: Denies nausea, heartburn or change in bowel habits Skin: Denies abnormal skin rashes Lymphatics: Denies new lymphadenopathy or easy bruising Neurological: Denies numbness, tingling or new weaknesses Behavioral/Psych: Mood is stable, no new changes  Extremities: No lower extremity edema Breast: denies any pain or lumps or nodules in either breasts All other systems were reviewed with the patient and are negative.  I have reviewed the past medical history, past surgical history, social history and family history with the patient and they are unchanged from previous note.  ALLERGIES:  is allergic to penicillins; cephalexin; and statins.  MEDICATIONS:  Current Outpatient Medications  Medication Sig Dispense Refill  . aspirin EC 81 MG tablet Take 81 mg by mouth daily.    . Calcium Carb-Cholecalciferol (CALCIUM + VITAMIN  D3 PO) Take 1 tablet by mouth daily.    . Empagliflozin-metFORMIN HCl (SYNJARDY) 12.12-998 MG TABS Take 1 tablet by mouth 2 (two) times daily.    Marland Kitchen EXFORGE 5-320 MG tablet Take 1 tablet by mouth daily.    . ferrous  sulfate 325 (65 FE) MG tablet Take 325 mg by mouth daily with breakfast.    . hydrochlorothiazide (HYDRODIURIL) 25 MG tablet Take 25 mg by mouth daily.    Marland Kitchen HYDROcodone-acetaminophen (NORCO) 5-325 MG tablet Take 1-2 tablets by mouth every 6 (six) hours as needed for moderate pain or severe pain. 20 tablet 0  . Insulin Glargine, 1 Unit Dial, (TOUJEO SOLOSTAR) 300 UNIT/ML SOPN Inject 50 Units into the skin every evening.    . lidocaine-prilocaine (EMLA) cream Apply to affected area once 30 g 3  . magic mouthwash w/lidocaine SOLN Take 5 mLs by mouth 3 (three) times daily as needed for mouth pain. Recipe: 1 part lidocaine, 1 part Maalox, 1 part Diphenhydramine. 100 mL 0  . mirtazapine (REMERON) 7.5 MG tablet Take 1 tablet (7.5 mg total) by mouth at bedtime. 30 tablet 0  . omeprazole (PRILOSEC) 40 MG capsule Take 40 mg by mouth every morning.     . ondansetron (ZOFRAN) 8 MG tablet Take 1 tablet (8 mg total) by mouth 2 (two) times daily as needed (Nausea or vomiting). 30 tablet 1  . prochlorperazine (COMPAZINE) 10 MG tablet Take 1 tablet (10 mg total) by mouth every 6 (six) hours as needed (Nausea or vomiting). 30 tablet 1  . simvastatin (ZOCOR) 40 MG tablet Take 40 mg by mouth daily.    . traMADol (ULTRAM) 50 MG tablet      No current facility-administered medications for this visit.    Facility-Administered Medications Ordered in Other Visits  Medication Dose Route Frequency Provider Last Rate Last Dose  . sodium chloride flush (NS) 0.9 % injection 10 mL  10 mL Intravenous PRN Nicholas Lose, MD   10 mL at 09/09/18 1114    PHYSICAL EXAMINATION: ECOG PERFORMANCE STATUS: 1 - Symptomatic but completely ambulatory  Vitals:   11/28/18 1059  BP: (!) 159/80  Pulse: 85  Resp: 18  Temp: 98.1 F (36.7 C)  SpO2: 100%   Filed Weights   11/28/18 1059  Weight: 162 lb 12.8 oz (73.8 kg)    GENERAL: alert, no distress and comfortable SKIN: skin color, texture, turgor are normal, no rashes or  significant lesions EYES: normal, Conjunctiva are pink and non-injected, sclera clear OROPHARYNX: no exudate, no erythema and lips, buccal mucosa, and tongue normal  NECK: supple, thyroid normal size, non-tender, without nodularity LYMPH: no palpable lymphadenopathy in the cervical, axillary or inguinal LUNGS: clear to auscultation and percussion with normal breathing effort HEART: regular rate & rhythm and no murmurs and no lower extremity edema ABDOMEN: abdomen soft, non-tender and normal bowel sounds MUSCULOSKELETAL: no cyanosis of digits and no clubbing  NEURO: alert & oriented x 3 with fluent speech, no focal motor/sensory deficits EXTREMITIES: No lower extremity edema  LABORATORY DATA:  I have reviewed the data as listed CMP Latest Ref Rng & Units 11/21/2018 11/14/2018 11/07/2018  Glucose 70 - 99 mg/dL 186(H) 134(H) 142(H)  BUN 8 - 23 mg/dL 13 11 15   Creatinine 0.44 - 1.00 mg/dL 0.88 0.85 0.86  Sodium 135 - 145 mmol/L 135 134(L) 137  Potassium 3.5 - 5.1 mmol/L 4.3 4.2 4.2  Chloride 98 - 111 mmol/L 103 101 106  CO2 22 - 32 mmol/L 22 23  21(L)  Calcium 8.9 - 10.3 mg/dL 9.4 9.6 9.3  Total Protein 6.5 - 8.1 g/dL 6.7 7.0 6.9  Total Bilirubin 0.3 - 1.2 mg/dL 0.3 0.3 0.3  Alkaline Phos 38 - 126 U/L 79 72 69  AST 15 - 41 U/L 16 17 16   ALT 0 - 44 U/L 20 25 21     Lab Results  Component Value Date   WBC 7.0 11/28/2018   HGB 11.0 (L) 11/28/2018   HCT 33.5 (L) 11/28/2018   MCV 85.5 11/28/2018   PLT 336 11/28/2018   NEUTROABS 3.4 11/28/2018    ASSESSMENT & PLAN:  Malignant neoplasm of upper-inner quadrant of left breast in female, estrogen receptor positive ([facility]) 08/15/18:Left Lumpectomy: IDC grade 3, 1.9 cm, Posterior margin positive (Muscle) due to a transected satellite nodule 0.5 cm, 0/3 LN Neg, ER 80%, PR 70%, Ki-67 40%, HER-2 +3+ by IHC with heterogeneity T1cN0 Stage 1A  Posterior Margin: based on discussions with surgery, it is muscle and hence no further surgery is possible.   Plan:  1.Adjuvant Taxol Herceptin weekly x12 followed by Herceptin maintenance for 1 year 2.Adjuvant radiation therapy 3.Followed by adjuvant antiestrogen therapy --------------------------------------------------------------------------------------------------------------------------------------------------------- Current treatment: Cycle 12 Taxol Herceptin  Chemo toxicities: 1.Hair thinning and hair loss  2.Mild anemia: stable Patient denies any signs or symptoms of peripheral neuropathy.  She has appointment for radiation oncology on 12/28/2018.   Plan is to continue Herceptin every 3 weeks to complete 1 year of maintenance therapy.  Patient needs an echocardiogram.  Labs and MD follow-up every 6 weeks.   No orders of the defined types were placed in this encounter.  The patient has a good understanding of the overall plan. she agrees with it. she will call with any problems that may develop before the next visit here.  Nicholas Lose, MD 11/28/2018  Julious Oka Dorshimer am acting as scribe for Dr. Nicholas Lose.  I have reviewed the above documentation for accuracy and completeness, and I agree with the above.

## 2018-11-28 ENCOUNTER — Other Ambulatory Visit: Payer: Self-pay

## 2018-11-28 ENCOUNTER — Other Ambulatory Visit: Payer: Self-pay | Admitting: *Deleted

## 2018-11-28 ENCOUNTER — Inpatient Hospital Stay: Payer: Federal, State, Local not specified - PPO

## 2018-11-28 ENCOUNTER — Inpatient Hospital Stay (HOSPITAL_BASED_OUTPATIENT_CLINIC_OR_DEPARTMENT_OTHER): Payer: Federal, State, Local not specified - PPO | Admitting: Hematology and Oncology

## 2018-11-28 ENCOUNTER — Encounter: Payer: Self-pay | Admitting: *Deleted

## 2018-11-28 DIAGNOSIS — Z5111 Encounter for antineoplastic chemotherapy: Secondary | ICD-10-CM | POA: Diagnosis not present

## 2018-11-28 DIAGNOSIS — C50212 Malignant neoplasm of upper-inner quadrant of left female breast: Secondary | ICD-10-CM

## 2018-11-28 DIAGNOSIS — G629 Polyneuropathy, unspecified: Secondary | ICD-10-CM

## 2018-11-28 DIAGNOSIS — Z90710 Acquired absence of both cervix and uterus: Secondary | ICD-10-CM

## 2018-11-28 DIAGNOSIS — R5383 Other fatigue: Secondary | ICD-10-CM

## 2018-11-28 DIAGNOSIS — E119 Type 2 diabetes mellitus without complications: Secondary | ICD-10-CM

## 2018-11-28 DIAGNOSIS — M1990 Unspecified osteoarthritis, unspecified site: Secondary | ICD-10-CM

## 2018-11-28 DIAGNOSIS — Z170 Estrogen receptor positive status [ER+]: Secondary | ICD-10-CM | POA: Diagnosis not present

## 2018-11-28 DIAGNOSIS — Z5112 Encounter for antineoplastic immunotherapy: Secondary | ICD-10-CM

## 2018-11-28 DIAGNOSIS — E785 Hyperlipidemia, unspecified: Secondary | ICD-10-CM

## 2018-11-28 DIAGNOSIS — Z9221 Personal history of antineoplastic chemotherapy: Secondary | ICD-10-CM

## 2018-11-28 DIAGNOSIS — Z794 Long term (current) use of insulin: Secondary | ICD-10-CM

## 2018-11-28 DIAGNOSIS — D649 Anemia, unspecified: Secondary | ICD-10-CM

## 2018-11-28 DIAGNOSIS — R0602 Shortness of breath: Secondary | ICD-10-CM

## 2018-11-28 DIAGNOSIS — I739 Peripheral vascular disease, unspecified: Secondary | ICD-10-CM

## 2018-11-28 DIAGNOSIS — Z95828 Presence of other vascular implants and grafts: Secondary | ICD-10-CM

## 2018-11-28 DIAGNOSIS — K219 Gastro-esophageal reflux disease without esophagitis: Secondary | ICD-10-CM

## 2018-11-28 DIAGNOSIS — I10 Essential (primary) hypertension: Secondary | ICD-10-CM

## 2018-11-28 DIAGNOSIS — Z90722 Acquired absence of ovaries, bilateral: Secondary | ICD-10-CM

## 2018-11-28 LAB — CBC WITH DIFFERENTIAL (CANCER CENTER ONLY)
Abs Immature Granulocytes: 0.09 10*3/uL — ABNORMAL HIGH (ref 0.00–0.07)
Basophils Absolute: 0 10*3/uL (ref 0.0–0.1)
Basophils Relative: 0 %
Eosinophils Absolute: 0 10*3/uL (ref 0.0–0.5)
Eosinophils Relative: 0 %
HCT: 33.5 % — ABNORMAL LOW (ref 36.0–46.0)
Hemoglobin: 11 g/dL — ABNORMAL LOW (ref 12.0–15.0)
Immature Granulocytes: 1 %
Lymphocytes Relative: 40 %
Lymphs Abs: 2.8 10*3/uL (ref 0.7–4.0)
MCH: 28.1 pg (ref 26.0–34.0)
MCHC: 32.8 g/dL (ref 30.0–36.0)
MCV: 85.5 fL (ref 80.0–100.0)
Monocytes Absolute: 0.7 10*3/uL (ref 0.1–1.0)
Monocytes Relative: 10 %
Neutro Abs: 3.4 10*3/uL (ref 1.7–7.7)
Neutrophils Relative %: 49 %
Platelet Count: 336 10*3/uL (ref 150–400)
RBC: 3.92 MIL/uL (ref 3.87–5.11)
RDW: 15.8 % — ABNORMAL HIGH (ref 11.5–15.5)
WBC Count: 7 10*3/uL (ref 4.0–10.5)
nRBC: 0 % (ref 0.0–0.2)

## 2018-11-28 LAB — CMP (CANCER CENTER ONLY)
ALT: 17 U/L (ref 0–44)
AST: 11 U/L — ABNORMAL LOW (ref 15–41)
Albumin: 3.6 g/dL (ref 3.5–5.0)
Alkaline Phosphatase: 72 U/L (ref 38–126)
Anion gap: 11 (ref 5–15)
BUN: 20 mg/dL (ref 8–23)
CO2: 22 mmol/L (ref 22–32)
Calcium: 9.7 mg/dL (ref 8.9–10.3)
Chloride: 102 mmol/L (ref 98–111)
Creatinine: 0.91 mg/dL (ref 0.44–1.00)
GFR, Est AFR Am: >60 mL/min (ref >60)
GFR, Estimated: >60 mL/min (ref >60)
Glucose, Bld: 164 mg/dL — ABNORMAL HIGH (ref 70–99)
Potassium: 4.1 mmol/L (ref 3.5–5.1)
Sodium: 135 mmol/L (ref 135–145)
Total Bilirubin: 0.3 mg/dL (ref 0.3–1.2)
Total Protein: 6.9 g/dL (ref 6.5–8.1)

## 2018-11-28 MED ORDER — SODIUM CHLORIDE 0.9% FLUSH
10.0000 mL | Freq: Once | INTRAVENOUS | Status: AC
  Administered 2018-11-28: 10 mL
  Filled 2018-11-28: qty 10

## 2018-11-28 MED ORDER — SODIUM CHLORIDE 0.9 % IV SOLN
60.0000 mg/m2 | Freq: Once | INTRAVENOUS | Status: AC
  Administered 2018-11-28: 15:00:00 114 mg via INTRAVENOUS
  Filled 2018-11-28: qty 19

## 2018-11-28 MED ORDER — SODIUM CHLORIDE 0.9 % IV SOLN
20.0000 mg | Freq: Once | INTRAVENOUS | Status: AC
  Administered 2018-11-28: 20 mg via INTRAVENOUS
  Filled 2018-11-28: qty 2

## 2018-11-28 MED ORDER — DIPHENHYDRAMINE HCL 50 MG/ML IJ SOLN
50.0000 mg | Freq: Once | INTRAMUSCULAR | Status: AC
  Administered 2018-11-28: 13:00:00 50 mg via INTRAVENOUS

## 2018-11-28 MED ORDER — SODIUM CHLORIDE 0.9% FLUSH
10.0000 mL | INTRAVENOUS | Status: DC | PRN
  Administered 2018-11-28: 16:00:00 10 mL
  Filled 2018-11-28: qty 10

## 2018-11-28 MED ORDER — HEPARIN SOD (PORK) LOCK FLUSH 100 UNIT/ML IV SOLN
500.0000 [IU] | Freq: Once | INTRAVENOUS | Status: AC | PRN
  Administered 2018-11-28: 16:00:00 500 [IU]
  Filled 2018-11-28: qty 5

## 2018-11-28 MED ORDER — ACETAMINOPHEN 325 MG PO TABS
ORAL_TABLET | ORAL | Status: AC
  Filled 2018-11-28: qty 2

## 2018-11-28 MED ORDER — SODIUM CHLORIDE 0.9 % IV SOLN
Freq: Once | INTRAVENOUS | Status: AC
  Administered 2018-11-28: 13:00:00 via INTRAVENOUS
  Filled 2018-11-28: qty 250

## 2018-11-28 MED ORDER — DIPHENHYDRAMINE HCL 50 MG/ML IJ SOLN
INTRAMUSCULAR | Status: AC
  Filled 2018-11-28: qty 1

## 2018-11-28 MED ORDER — TRASTUZUMAB CHEMO 150 MG IV SOLR
450.0000 mg | Freq: Once | INTRAVENOUS | Status: AC
  Administered 2018-11-28: 450 mg via INTRAVENOUS
  Filled 2018-11-28: qty 21.43

## 2018-11-28 MED ORDER — ACETAMINOPHEN 325 MG PO TABS
650.0000 mg | ORAL_TABLET | Freq: Once | ORAL | Status: AC
  Administered 2018-11-28: 13:00:00 650 mg via ORAL

## 2018-11-28 NOTE — Progress Notes (Signed)
11/28/18  Herceptin dose orders changed by Dr Lindi Adie to 6 mg/kg every 3 weeks.  Today's dose is in treatment plan is in at 2 mg/kg. Per dosing guidelines today's dose should be increased to 6 mg/kg as next dose is in 3 weeks.  Dr Lindi Adie approved dose increase to 6 mg/kg today.  Today's order modified to reflect 6 mg/kg dosing.  T.O. Dr Jannifer Hick, PharmD

## 2018-11-28 NOTE — Patient Instructions (Signed)
[facility] Discharge Instructions for Patients Receiving Chemotherapy  Today you received the following chemotherapy agents:  Herceptin and Taxol.  To help prevent nausea and vomiting after your treatment, we encourage you to take your nausea medication as directed.   If you develop nausea and vomiting that is not controlled by your nausea medication, call the clinic.   BELOW ARE SYMPTOMS THAT SHOULD BE REPORTED IMMEDIATELY:  *FEVER GREATER THAN 100.5 F  *CHILLS WITH OR WITHOUT FEVER  NAUSEA AND VOMITING THAT IS NOT CONTROLLED WITH YOUR NAUSEA MEDICATION  *UNUSUAL SHORTNESS OF BREATH  *UNUSUAL BRUISING OR BLEEDING  TENDERNESS IN MOUTH AND THROAT WITH OR WITHOUT PRESENCE OF ULCERS  *URINARY PROBLEMS  *BOWEL PROBLEMS  UNUSUAL RASH Items with * indicate a potential emergency and should be followed up as soon as possible.  Feel free to call the clinic should you have any questions or concerns. The clinic phone number is [phone].  Please show the CHEMO ALERT CARD at check-in to the Emergency Department and triage nurse.   

## 2018-11-28 NOTE — Patient Instructions (Signed)

## 2018-11-28 NOTE — Assessment & Plan Note (Signed)
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  Plan:  1.Adjuvant Taxol Herceptin weekly x12 followed by Herceptin maintenance for 1 year 2.Adjuvant radiation therapy 3.Followed by adjuvant antiestrogen therapy --------------------------------------------------------------------------------------------------------------------------------------------------------- Current treatment: Cycle 12 Taxol Herceptin  Chemo toxicities: 1.Hair thinning and hair loss  2.Mild anemia: stable Patient denies any signs or symptoms of peripheral neuropathy.  She has appointment for radiation oncology on 12/28/2018.   Plan is to continue Herceptin every 3 weeks to complete 1 year of maintenance therapy.  Labs and MD follow-up every 6 weeks.

## 2018-11-29 ENCOUNTER — Ambulatory Visit (HOSPITAL_COMMUNITY)
Admission: RE | Admit: 2018-11-29 | Discharge: 2018-11-29 | Disposition: A | Discharge status: Home / Self Care | Payer: Federal, State, Local not specified - PPO | Source: Ambulatory Visit | Attending: Hematology and Oncology | Admitting: Hematology and Oncology

## 2018-11-29 DIAGNOSIS — E1151 Type 2 diabetes mellitus with diabetic peripheral angiopathy without gangrene: Secondary | ICD-10-CM | POA: Insufficient documentation

## 2018-11-29 DIAGNOSIS — C50212 Malignant neoplasm of upper-inner quadrant of left female breast: Secondary | ICD-10-CM | POA: Diagnosis not present

## 2018-11-29 DIAGNOSIS — K219 Gastro-esophageal reflux disease without esophagitis: Secondary | ICD-10-CM | POA: Insufficient documentation

## 2018-11-29 DIAGNOSIS — I10 Essential (primary) hypertension: Secondary | ICD-10-CM | POA: Insufficient documentation

## 2018-11-29 DIAGNOSIS — E785 Hyperlipidemia, unspecified: Secondary | ICD-10-CM | POA: Diagnosis not present

## 2018-11-29 DIAGNOSIS — Z170 Estrogen receptor positive status [ER+]: Secondary | ICD-10-CM | POA: Diagnosis not present

## 2018-11-29 NOTE — Progress Notes (Signed)
  Echocardiogram 2D Echocardiogram has been performed.  Darlina Sicilian M 11/29/2018, 10:02 AM

## 2018-12-16 ENCOUNTER — Other Ambulatory Visit: Payer: Self-pay | Admitting: Hematology and Oncology

## 2018-12-19 ENCOUNTER — Other Ambulatory Visit: Payer: Self-pay

## 2018-12-19 ENCOUNTER — Inpatient Hospital Stay: Payer: Federal, State, Local not specified - PPO | Attending: Hematology and Oncology

## 2018-12-19 VITALS — BP 167/59 | HR 94 | Temp 98.2°F | Resp 18

## 2018-12-19 DIAGNOSIS — Z170 Estrogen receptor positive status [ER+]: Secondary | ICD-10-CM | POA: Diagnosis not present

## 2018-12-19 DIAGNOSIS — Z5112 Encounter for antineoplastic immunotherapy: Secondary | ICD-10-CM | POA: Insufficient documentation

## 2018-12-19 DIAGNOSIS — C50212 Malignant neoplasm of upper-inner quadrant of left female breast: Secondary | ICD-10-CM

## 2018-12-19 MED ORDER — ACETAMINOPHEN 325 MG PO TABS
650.0000 mg | ORAL_TABLET | Freq: Once | ORAL | Status: AC
  Administered 2018-12-19: 650 mg via ORAL

## 2018-12-19 MED ORDER — HEPARIN SOD (PORK) LOCK FLUSH 100 UNIT/ML IV SOLN
500.0000 [IU] | Freq: Once | INTRAVENOUS | Status: AC | PRN
  Administered 2018-12-19: 500 [IU]
  Filled 2018-12-19: qty 5

## 2018-12-19 MED ORDER — SODIUM CHLORIDE 0.9% FLUSH
10.0000 mL | INTRAVENOUS | Status: DC | PRN
  Administered 2018-12-19: 10 mL
  Filled 2018-12-19: qty 10

## 2018-12-19 MED ORDER — SODIUM CHLORIDE 0.9 % IV SOLN
Freq: Once | INTRAVENOUS | Status: AC
  Administered 2018-12-19: 12:00:00 via INTRAVENOUS
  Filled 2018-12-19: qty 250

## 2018-12-19 MED ORDER — TRASTUZUMAB CHEMO 150 MG IV SOLR
450.0000 mg | Freq: Once | INTRAVENOUS | Status: AC
  Administered 2018-12-19: 450 mg via INTRAVENOUS
  Filled 2018-12-19: qty 21.43

## 2018-12-19 MED ORDER — ACETAMINOPHEN 325 MG PO TABS
ORAL_TABLET | ORAL | Status: AC
  Filled 2018-12-19: qty 2

## 2018-12-19 MED ORDER — DIPHENHYDRAMINE HCL 25 MG PO CAPS
50.0000 mg | ORAL_CAPSULE | Freq: Once | ORAL | Status: AC
  Administered 2018-12-19: 50 mg via ORAL

## 2018-12-19 MED ORDER — DIPHENHYDRAMINE HCL 25 MG PO CAPS
ORAL_CAPSULE | ORAL | Status: AC
  Filled 2018-12-19: qty 2

## 2018-12-19 NOTE — Patient Instructions (Signed)
Trastuzumab injection for infusion What is this medicine? TRASTUZUMAB (tras TOO zoo mab) is a monoclonal antibody. It is used to treat breast cancer and stomach cancer. This medicine may be used for other purposes; ask your health care provider or pharmacist if you have questions. COMMON BRAND NAME(S): Herceptin, KANJINTI, OGIVRI What should I tell my health care provider before I take this medicine? They need to know if you have any of these conditions: -heart disease -heart failure -lung or breathing disease, like asthma -an unusual or allergic reaction to trastuzumab, benzyl alcohol, or other medications, foods, dyes, or preservatives -pregnant or trying to get pregnant -breast-feeding How should I use this medicine? This drug is given as an infusion into a vein. It is administered in a hospital or clinic by a specially trained health care professional. Talk to your pediatrician regarding the use of this medicine in children. This medicine is not approved for use in children. Overdosage: If you think you have taken too much of this medicine contact a poison control center or emergency room at once. NOTE: This medicine is only for you. Do not share this medicine with others. What if I miss a dose? It is important not to miss a dose. Call your doctor or health care professional if you are unable to keep an appointment. What may interact with this medicine? This medicine may interact with the following medications: -certain types of chemotherapy, such as daunorubicin, doxorubicin, epirubicin, and idarubicin This list may not describe all possible interactions. Give your health care provider a list of all the medicines, herbs, non-prescription drugs, or dietary supplements you use. Also tell them if you smoke, drink alcohol, or use illegal drugs. Some items may interact with your medicine. What should I watch for while using this medicine? Visit your doctor for checks on your progress. Report  any side effects. Continue your course of treatment even though you feel ill unless your doctor tells you to stop. Call your doctor or health care professional for advice if you get a fever, chills or sore throat, or other symptoms of a cold or flu. Do not treat yourself. Try to avoid being around people who are sick. You may experience fever, chills and shaking during your first infusion. These effects are usually mild and can be treated with other medicines. Report any side effects during the infusion to your health care professional. Fever and chills usually do not happen with later infusions. Do not become pregnant while taking this medicine or for 7 months after stopping it. Women should inform their doctor if they wish to become pregnant or think they might be pregnant. Women of child-bearing potential will need to have a negative pregnancy test before starting this medicine. There is a potential for serious side effects to an unborn child. Talk to your health care professional or pharmacist for more information. Do not breast-feed an infant while taking this medicine or for 7 months after stopping it. Women must use effective birth control with this medicine. What side effects may I notice from receiving this medicine? Side effects that you should report to your doctor or health care professional as soon as possible: -allergic reactions like skin rash, itching or hives, swelling of the face, lips, or tongue -chest pain or palpitations -cough -dizziness -feeling faint or lightheaded, falls -fever -general ill feeling or flu-like symptoms -signs of worsening heart failure like breathing problems; swelling in your legs and feet -unusually weak or tired Side effects that usually do not   require medical attention (report to your doctor or health care professional if they continue or are bothersome): -bone pain -changes in taste -diarrhea -joint pain -nausea/vomiting -weight loss This list may  not describe all possible side effects. Call your doctor for medical advice about side effects. You may report side effects to FDA at 1-800-FDA-1088. Where should I keep my medicine? This drug is given in a hospital or clinic and will not be stored at home. NOTE: This sheet is a summary. It may not cover all possible information. If you have questions about this medicine, talk to your doctor, pharmacist, or health care provider.  2019 Elsevier/Gold Standard (2016-07-21 14:37:52)  

## 2018-12-23 NOTE — Progress Notes (Signed)
Office Visit Note  Patient: Gloria Mckinney             Date of Birth: 11/01/1945           MRN: [id number]             PCP: [name], MD Referring: [name], MD Visit Date: 01/05/2019 Occupation: @GUAROCC @  Subjective:  Lower back pain.   History of Present Illness: Gloria Mckinney is a 73 y.o. female seen in consultation per request of her PCP.  According to patient in 2001 she started having knee joint discomfort.  At the time she was advised total knee replacement but she could not have it due to elevated hemoglobin A1c.  She states over time she has had pain and discomfort in multiple joints.  She has had arthritis in her hands hip joints and knee joints.  She was diagnosed with breast cancer in December 2019.  She states she just recently finished chemotherapy and is started on Herceptin.  She has been experiencing lower back pain for the last few months.  The pain is constant and is more on the right side.  She states she also has right SI joint pain and right trochanteric bursa pain.  She has difficulty sleeping on the right side at night.  She denies any joint swelling.  She also has history of gout for several years.  She states the last gout episode was about 2 years ago.  She takes colchicine as needed for gout.  Activities of Daily Living:  Patient reports morning stiffness for 5 minutes.   Patient Reports nocturnal pain.  Difficulty dressing/grooming: Denies Difficulty climbing stairs: Reports Difficulty getting out of chair: Reports Difficulty using hands for taps, buttons, cutlery, and/or writing: Denies  Review of Systems  Constitutional: Negative for fatigue, night sweats, weight gain and weight loss.  HENT: Positive for mouth dryness. Negative for mouth sores, trouble swallowing, trouble swallowing and nose dryness.   Eyes: Negative for pain, redness, visual disturbance and dryness.  Respiratory: Negative for cough, shortness of  breath and difficulty breathing.   Cardiovascular: Negative for chest pain, palpitations, hypertension, irregular heartbeat and swelling in legs/feet.  Gastrointestinal: Negative for blood in stool, constipation and diarrhea.  Endocrine: Negative for increased urination.  Genitourinary: Negative for vaginal dryness.  Musculoskeletal: Positive for arthralgias, joint pain and morning stiffness. Negative for joint swelling, myalgias, muscle weakness, muscle tenderness and myalgias.  Skin: Negative for color change, rash, hair loss, skin tightness, ulcers and sensitivity to sunlight.  Allergic/Immunologic: Negative for susceptible to infections.  Neurological: Negative for dizziness, memory loss, night sweats and weakness.  Hematological: Negative for swollen glands.  Psychiatric/Behavioral: Negative for depressed mood and sleep disturbance. The patient is not nervous/anxious.     PMFS History:  Patient Active Problem List   Diagnosis Date Noted  . Port-A-Cath in place 09/19/2018  . Malignant neoplasm of upper-inner quadrant of left breast in female, estrogen receptor positive ([place]) 07/27/2018  . Dyspnea on exertion 10/21/2016  . Type 2 diabetes mellitus without complication, without long-term current use of insulin ([place]) 10/21/2016  . Dyslipidemia 10/21/2016    Past Medical History:  Diagnosis Date  . GERD (gastroesophageal reflux disease)   . Hypertension   . Mixed hyperlipidemia   . OA (osteoarthritis)    knee right  . Peripheral neuropathy   . PVD (peripheral vascular disease) ([place])   . Type 2 diabetes mellitus treated with insulin [facility])    endocrinologist--- dr Providence Crosby Dannemiller  Family History  Problem Relation Age of Onset  . Hypertension Mother   . CVA Mother   . Heart disease Mother   . Asthma Father   . Hypertension Sister   . Diabetes Sister   . Heart disease Brother   . Hypertension Sister   . Diabetes Sister   . Hypertension Sister   . Diabetes Sister   . Healthy  Son   . Healthy Daughter   . Healthy Daughter   . Healthy Daughter   . Breast cancer Neg Hx    Past Surgical History:  Procedure Laterality Date  . BREAST BIOPSY Bilateral 2000   benign  . BREAST LUMPECTOMY WITH RADIOACTIVE SEED AND SENTINEL LYMPH NODE BIOPSY Left 08/15/2018   Procedure: LEFT BREAST LUMPECTOMY WITH RADIOACTIVE SEED AND LEFT AXILLARY DEEP SENTINEL LYMPH NODE BIOPSY INJECT BLUE DYE LEFT BREAST;  Surgeon: Fanny Skates, MD;  Location: [facility];  Service: General;  Laterality: Left;  . PORTACATH PLACEMENT Right 08/15/2018   Procedure: INSERTION PORT-A-CATH WITH ULTRASOUND;  Surgeon: Fanny Skates, MD;  Location: [facility];  Service: General;  Laterality: Right;  . SHOULDER ARTHROSCOPY Right 06-16-2002   dr graves  . SHOULDER OPEN ROTATOR CUFF REPAIR Right 07-28-2000    dr Berenice Primas  . VAGINAL HYSTERECTOMY  1992   with BSO  . WRIST SURGERY  2007   Social History   Social History Narrative   Lives with husband and son.     Immunization History  Administered Date(s) Administered  . Influenza-Unspecified 05/24/2018     Objective: Vital Signs: BP (!) 157/88 (BP Location: Left Arm, Patient Position: Sitting, Cuff Size: Normal)   Pulse 90   Resp 13   Ht 5\' 2"  (1.575 m)   Wt 168 lb (76.2 kg)   BMI 30.73 kg/m    Physical Exam Vitals signs and nursing note reviewed.  Constitutional:      Appearance: She is well-developed.  HENT:     Head: Normocephalic and atraumatic.  Eyes:     Conjunctiva/sclera: Conjunctivae normal.  Neck:     Musculoskeletal: Normal range of motion.  Cardiovascular:     Rate and Rhythm: Normal rate and regular rhythm.     Heart sounds: Normal heart sounds.  Pulmonary:     Effort: Pulmonary effort is normal.     Breath sounds: Normal breath sounds.  Abdominal:     General: Bowel sounds are normal.     Palpations: Abdomen is soft.  Lymphadenopathy:     Cervical: No cervical adenopathy.  Skin:    General:  Skin is warm and dry.     Capillary Refill: Capillary refill takes less than 2 seconds.  Neurological:     Mental Status: She is alert and oriented to person, place, and time.  Psychiatric:        Behavior: Behavior normal.      Musculoskeletal Exam: C-spine thoracic spine was in good range of motion.  She has discomfort range of motion of her lumbar spine which was limited.  She also had tenderness over right SI joint and right trochanteric bursa.  Shoulder joints, elbow joints, wrist joints were in good range of motion with no synovitis.  She has DIP and PIP mild thickening consistent with osteoarthritis.  She had good range of motion in her hip joints.  She has good range of motion her knee joints with some crepitus and discomfort.  No synovitis was noted.  CDAI Exam: CDAI Score: Not documented Patient Global  Assessment: Not documented; Provider Global Assessment: Not documented Swollen: Not documented; Tender: Not documented Joint Exam   Not documented   There is currently no information documented on the homunculus. Go to the Rheumatology activity and complete the homunculus joint exam.  Investigation: No additional findings.  Imaging: Xr Lumbar Spine 2-3 Views  Result Date: 01/05/2019 Multilevel spondylosis with anterior osteophytes.  Mild L4-5 spondylolisthesis.  Facet joint arthropathy. Impression: These findings are consistent with degenerative disease of lumbar spine and facet joint arthropathy.  Xr Pelvis 1-2 Views  Result Date: 01/05/2019 No SI joint to sclerosis or narrowing was noted.  Narrowing and spurring was noted. Impression: Unremarkable x-ray of the SI joints.    Recent Labs: Lab Results  Component Value Date   WBC 7.0 11/28/2018   HGB 11.0 (L) 11/28/2018   PLT 336 11/28/2018   NA 135 11/28/2018   K 4.1 11/28/2018   CL 102 11/28/2018   CO2 22 11/28/2018   GLUCOSE 164 (H) 11/28/2018   BUN 20 11/28/2018   CREATININE 0.91 11/28/2018   BILITOT 0.3  11/28/2018   ALKPHOS 72 11/28/2018   AST 11 (L) 11/28/2018   ALT 17 11/28/2018   PROT 6.9 11/28/2018   ALBUMIN 3.6 11/28/2018   CALCIUM 9.7 11/28/2018   GFRAA >60 11/28/2018    Speciality Comments: No specialty comments available.  Procedures:  No procedures performed Allergies: Penicillins; Cephalexin; and Statins   Assessment / Plan:     Visit Diagnoses: Chronic midline low back pain without sciatica -patient complains of chronic lower back pain.  Plan: XR Lumbar Spine 2-3 Views.  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  She has anterior spurring.  Mild L4-5 listhesis.  Due to history of diabetes she cannot take multiple medications.  We will apply for TENS unit with 2 electrodes.  Natural anti-inflammatories were discussed.  I have given her a handout on back exercises.  I will also given her a referral for physical therapy.  Weight loss diet and exercise was discussed.  Chronic right SI joint pain -patient complains of right-sided SI joint.  She states she had frequent cortisone injections by Dr. Berenice Primas in her right SI joint.  Her blood glucose levels are mostly elevated.  I would avoid cortisone injection for right now.  Plan: XR Pelvis 1-2 Views.  The SI joints were unremarkable.  Primary osteoarthritis of both hands-clinical findings are consistent with osteoarthritis in her hands.  Trochanteric bursitis of both hips-she has been having pain and discomfort in her bilateral trochanteric bursa.  More so on the right side.  A handout on IT band exercises was given.  Primary osteoarthritis of left knee-patient gives history of end-stage osteoarthritis of her knee joint.  She states she was not a candidate of total knee replacement.  Primary osteoarthritis of both hips-chronic pain.  History of gout-patient gives longstanding history of gout and she has not had a flare in the last 2 years.  She takes colchicine on PRN basis.   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 Tens unit  . XR Lumbar Spine 2-3 Views  . XR Pelvis 1-2 Views   No orders of the defined types were placed in this encounter.   Face-to-face time spent with patient was 50 minutes. Greater than 50% of time was spent in counseling and coordination of care.  Follow-Up Instructions: Return if symptoms worsen or fail to improve, for Lower back pain.   Bo Merino, MD  Note - This record has been created using Editor, commissioning.  Chart creation errors have been sought, but may not always  have been located. Such creation errors do not reflect on  the standard of medical care.

## 2018-12-23 NOTE — Progress Notes (Signed)
Location of Breast Cancer: Left Breast  Histology per Pathology Report:  07/18/18 Diagnosis Breast, left, needle core biopsy, 10 o'clock - INVASIVE DUCTAL CARCINOMA, GRADE 3. SEE NOTE.  Receptor Status: ER(80%), PR (70%), Her2-neu (POS), Ki-(40%)  08/15/18 Diagnosis 1. Breast, lumpectomy, Left w/seed - INVASIVE DUCTAL CARCINOMA, 1.9 CM, GRADE 3. - POSTERIOR MARGIN INVOLVED BY INVASIVE CARCINOMA. - FIBROCYSTIC CHANGES WITH CALCIFICATIONS. - PREVIOUS BIOPSY SITE AND BIOPSY CLIP. 2. Lymph node, sentinel, biopsy, Left Axillary - ONE BENIGN LYMPH NODE (0/1). 3. Lymph node, sentinel, biopsy, Left - ONE BENIGN LYMPH NODE (0/1). 4. Lymph node, sentinel, biopsy, Left - ONE BENIGN LYMPH NODE (0/1).  Did patient present with symptoms or was this found on screening mammography?: It was found on a screening mammogram.   Past/Anticipated interventions by surgeon, if any: Procedure:                 Inject blue dye left breast                                      Left breast lumpectomy with radioactive seed localization                                      Left axillary deep sentinel lymph node biopsy                                      Insertion PowerPort [place] 8 French tunneled venous vascular access device                                      Use of fluoroscopy for guidance and positioning Surgeon:                     Edsel Petrin. Dalbert Batman, M.D., [facility]   Past/Anticipated interventions by medical oncology, if any:  11/28/18 Dr. Lindi Adie Plan:  1.Adjuvant Taxol Herceptin weekly x12 followed by Herceptin maintenance for 1 year 2.Adjuvant radiation therapy 3.Followed by adjuvant antiestrogen therapy --------------------------------------------------------------------------------------------------------------------------------------------------------- Current treatment: Cycle12Taxol Herceptin  Chemo toxicities: 1.Hair thinning and hair loss  2.Mild anemia: stable Patient denies  any signs or symptoms of peripheral neuropathy.  She has appointment for radiation oncology on 12/28/2018.   Plan is to continue Herceptin every 3 weeks to complete 1 year of maintenance therapy.  Patient needs an echocardiogram.  Lymphedema issues, if any:  She denies.   Pain issues, if any:  She denies.   SAFETY ISSUES:  Prior radiation? No  Pacemaker/ICD? No  Possible current pregnancy? No  Is the patient on methotrexate? No  Current Complaints / other details:      Janos Shampine, Stephani Police, RN 12/23/2018,10:12 AM

## 2018-12-28 ENCOUNTER — Encounter: Payer: Self-pay | Admitting: Radiation Oncology

## 2018-12-28 ENCOUNTER — Ambulatory Visit
Admission: RE | Admit: 2018-12-28 | Discharge: 2018-12-28 | Disposition: A | Discharge status: Home / Self Care | Payer: Federal, State, Local not specified - PPO | Source: Ambulatory Visit | Attending: Radiation Oncology | Admitting: Radiation Oncology

## 2018-12-28 ENCOUNTER — Other Ambulatory Visit: Payer: Self-pay

## 2018-12-28 DIAGNOSIS — Z170 Estrogen receptor positive status [ER+]: Secondary | ICD-10-CM | POA: Insufficient documentation

## 2018-12-28 DIAGNOSIS — C50212 Malignant neoplasm of upper-inner quadrant of left female breast: Secondary | ICD-10-CM

## 2018-12-28 DIAGNOSIS — Z510 Encounter for antineoplastic radiation therapy: Secondary | ICD-10-CM | POA: Insufficient documentation

## 2018-12-30 ENCOUNTER — Encounter: Payer: Self-pay | Admitting: Radiation Oncology

## 2018-12-30 NOTE — Progress Notes (Signed)
Radiation Oncology         [phone] ________________________________  Initial phone Consultation  Name: Gloria Mckinney MRN: [id number]  Date: 12/28/2018  DOB: 09-11-1945  LX:[id number], Ivin Booty, MD  Nicholas Lose, MD   REFERRING PHYSICIAN: Nicholas Lose, MD  DIAGNOSIS:    ICD-10-CM   1. Malignant neoplasm of upper-inner quadrant of left breast in female, estrogen receptor positive ([place]) C50.212    Z17.0   Cancer Staging Malignant neoplasm of upper-inner quadrant of left breast in female, estrogen receptor positive ([place]) Staging form: Breast, AJCC 8th Edition - Pathologic: Stage IA (pT1b, pN0, cM0, G3, ER+, PR+, HER2+) - Signed by Gardenia Phlegm, NP on 08/24/2018   CHIEF COMPLAINT:  left breast cancer  HISTORY OF PRESENT ILLNESS::Gloria Mckinney is a 73 y.o. female  who presented with a left breast mass on screening mammogram. 1.8cm measurement on [place], axilla negative on [place].  On 07/18/18  Left breast needle core biopsy, 10 o'clock showed: - INVASIVE DUCTAL CARCINOMA, GRADE 3. SEE NOTE.  Receptor Status: ER(80%), PR (70%), Her2-neu (POS), Ki-(40%)  She then underwent, on 08/15/18, surgery showing the following:   1. Breast, lumpectomy, Left w/seed - INVASIVE DUCTAL CARCINOMA, 1.9 CM, GRADE 3. - POSTERIOR MARGIN INVOLVED BY INVASIVE CARCINOMA. - FIBROCYSTIC CHANGES WITH CALCIFICATIONS. - PREVIOUS BIOPSY SITE AND BIOPSY CLIP. 2. Lymph node, sentinel, biopsy, Left Axillary - ONE BENIGN LYMPH NODE (0/1). 3. Lymph node, sentinel, biopsy, Left - ONE BENIGN LYMPH NODE (0/1). 4. Lymph node, sentinel, biopsy, Left - ONE BENIGN LYMPH NODE (0/1).    Dr. Lindi Adie prescribed  1.Adjuvant Taxol Herceptin weekly x12 followed by Herceptin maintenance for 1 year (still receiving the Herceptin, done with chemotherapy).  She has had hair thinning and hair loss and mild anemia   Lymphedema issues, if any:  She denies.   Pain issues, if any:  She denies.    SAFETY ISSUES:  Prior radiation? No  Pacemaker/ICD? No  Possible current pregnancy? No  Is the patient on methotrexate? No    PREVIOUS RADIATION THERAPY: No  PAST MEDICAL HISTORY:  has a past medical history of GERD (gastroesophageal reflux disease), Hypertension, Mixed hyperlipidemia, OA (osteoarthritis), Peripheral neuropathy, PVD (peripheral vascular disease) ([place]), and Type 2 diabetes mellitus treated with insulin ([place]).    PAST SURGICAL HISTORY: Past Surgical History:  Procedure Laterality Date  . BREAST BIOPSY Bilateral 2000   benign  . BREAST LUMPECTOMY WITH RADIOACTIVE SEED AND SENTINEL LYMPH NODE BIOPSY Left 08/15/2018   Procedure: LEFT BREAST LUMPECTOMY WITH RADIOACTIVE SEED AND LEFT AXILLARY DEEP SENTINEL LYMPH NODE BIOPSY INJECT BLUE DYE LEFT BREAST;  Surgeon: Fanny Skates, MD;  Location: [facility];  Service: General;  Laterality: Left;  . PORTACATH PLACEMENT Right 08/15/2018   Procedure: INSERTION PORT-A-CATH WITH ULTRASOUND;  Surgeon: Fanny Skates, MD;  Location: [facility];  Service: General;  Laterality: Right;  . SHOULDER ARTHROSCOPY Right 06-16-2002   dr graves  . SHOULDER OPEN ROTATOR CUFF REPAIR Right 07-28-2000    dr Berenice Primas  . VAGINAL HYSTERECTOMY  1992   with BSO  . WRIST SURGERY  2007    FAMILY HISTORY: family history includes Asthma in her father; CVA in her mother; Diabetes in her sister, sister, and sister; Heart disease in her brother and mother; Hypertension in her mother, sister, sister, and sister.  SOCIAL HISTORY:  reports that she has never smoked. She has never used smokeless tobacco. She reports that she does not drink alcohol or use drugs.  ALLERGIES: Penicillins; Cephalexin; and Statins  MEDICATIONS:  Current Outpatient Medications  Medication Sig Dispense Refill  . aspirin EC 81 MG tablet Take 81 mg by mouth daily.    . Calcium Carb-Cholecalciferol (CALCIUM + VITAMIN D3 PO) Take 1 tablet by mouth daily.     . Empagliflozin-metFORMIN HCl (SYNJARDY) 12.12-998 MG TABS Take 1 tablet by mouth 2 (two) times daily.    Marland Kitchen EXFORGE 5-320 MG tablet Take 1 tablet by mouth daily.    . hydrochlorothiazide (HYDRODIURIL) 25 MG tablet Take 25 mg by mouth daily.    . Insulin Glargine, 1 Unit Dial, (TOUJEO SOLOSTAR) 300 UNIT/ML SOPN Inject 50 Units into the skin every evening.    . lidocaine-prilocaine (EMLA) cream Apply to affected area once 30 g 3  . magic mouthwash w/lidocaine SOLN Take 5 mLs by mouth 3 (three) times daily as needed for mouth pain. Recipe: 1 part lidocaine, 1 part Maalox, 1 part Diphenhydramine. 100 mL 0  . meloxicam (MOBIC) 15 MG tablet     . mirtazapine (REMERON) 7.5 MG tablet Take 1 tablet (7.5 mg total) by mouth at bedtime. 30 tablet 0  . omeprazole (PRILOSEC) 40 MG capsule Take 40 mg by mouth every morning.     . simvastatin (ZOCOR) 40 MG tablet Take 40 mg by mouth daily.    . ferrous sulfate 325 (65 FE) MG tablet Take 325 mg by mouth daily with breakfast.    . HYDROcodone-acetaminophen (NORCO) 5-325 MG tablet Take 1-2 tablets by mouth every 6 (six) hours as needed for moderate pain or severe pain. (Patient not taking: Reported on 12/28/2018) 20 tablet 0  . ondansetron (ZOFRAN) 8 MG tablet Take 1 tablet (8 mg total) by mouth 2 (two) times daily as needed (Nausea or vomiting). (Patient not taking: Reported on 12/28/2018) 30 tablet 1  . prochlorperazine (COMPAZINE) 10 MG tablet Take 1 tablet (10 mg total) by mouth every 6 (six) hours as needed (Nausea or vomiting). (Patient not taking: Reported on 12/28/2018) 30 tablet 1  . traMADol (ULTRAM) 50 MG tablet      No current facility-administered medications for this encounter.    Facility-Administered Medications Ordered in Other Encounters  Medication Dose Route Frequency Provider Last Rate Last Dose  . sodium chloride flush (NS) 0.9 % injection 10 mL  10 mL Intravenous PRN Nicholas Lose, MD   10 mL at 09/09/18 1114    REVIEW OF SYSTEMS: as  above  PHYSICAL EXAM:  vitals were not taken for this visit.     NA   LABORATORY DATA:  Lab Results  Component Value Date   WBC 7.0 11/28/2018   HGB 11.0 (L) 11/28/2018   HCT 33.5 (L) 11/28/2018   MCV 85.5 11/28/2018   PLT 336 11/28/2018   CMP     Component Value Date/Time   NA 135 11/28/2018 1045   K 4.1 11/28/2018 1045   CL 102 11/28/2018 1045   CO2 22 11/28/2018 1045   GLUCOSE 164 (H) 11/28/2018 1045   BUN 20 11/28/2018 1045   CREATININE 0.91 11/28/2018 1045   CALCIUM 9.7 11/28/2018 1045   PROT 6.9 11/28/2018 1045   ALBUMIN 3.6 11/28/2018 1045   AST 11 (L) 11/28/2018 1045   ALT 17 11/28/2018 1045   ALKPHOS 72 11/28/2018 1045   BILITOT 0.3 11/28/2018 1045   GFRNONAA >60 11/28/2018 1045   GFRAA >60 11/28/2018 1045        RADIOGRAPHY: as above    IMPRESSION/PLAN:  It was a pleasure talking  with the patient and her significant other today. We discussed the risks, benefits, and side effects of radiotherapy. I recommend radiotherapy to the left breast to reduce her risk of locoregional recurrence by 2/3.  We discussed that radiation would take approximately 4 week. We spoke about acute effects including skin irritation and fatigue as well as much less common late effects including internal organ injury or irritation. We spoke about the latest technology that is used to minimize the risk of late effects for patients undergoing radiotherapy to the breast or chest wall. No guarantees of treatment were given. The patient is enthusiastic about proceeding with treatment. I look forward to participating in the patient's care.    Simulation ordered for next week.  A total of 3 medically necessary complex treatment devices will be fabricated and supervised by me: 2 fields with MLCs for custom blocks to protect heart, and lungs;  and, a Vac-lok. MORE COMPLEX DEVICES MAY BE MADE IN DOSIMETRY FOR FIELD IN FIELD BEAMS FOR DOSE HOMOGENEITY.  I have requested : 3D Simulation which is  medically necessary to give adequate dose to at risk tissues while sparing lungs and heart.  I have requested a DVH of the following structures: lungs, heart, lumpectomy cavity.    The patient will receive 40.05 Gy in 15 fractions to the left breast with 2 tangential fields.  This will be followed by a boost.  This encounter was provided by telephone due to pandemic risks - family and patient couldn't access WebEx.   The time spent during this encounter was at least 15 minutes. The attendants for this meeting include Eppie Gibson  and [employer].  During the encounter, Eppie Gibson was located at [facility] Radiation Oncology Department.  Gloria Mckinney was located at home.  __________________________________________   Eppie Gibson, MD

## 2019-01-03 NOTE — Assessment & Plan Note (Signed)
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  Plan:  1.Adjuvant Taxol Herceptin weekly x12 completed 11/28/2018 followed by Herceptin maintenance for 1 year 2.Adjuvant radiation therapy 3.Followed by adjuvant antiestrogen therapy --------------------------------------------------------------------------------------------------------------------------------------------------------- Current treatment: Adjuvant Herceptin Radiation will start adjuvant radiation therapy. Echocardiogram 11/29/2018: EF 55 to 60%  Plan is to continue Herceptin every 3 weeks to complete 1 year of maintenance therapy

## 2019-01-05 ENCOUNTER — Encounter: Payer: Self-pay | Admitting: Rheumatology

## 2019-01-05 ENCOUNTER — Ambulatory Visit (INDEPENDENT_AMBULATORY_CARE_PROVIDER_SITE_OTHER): Payer: Federal, State, Local not specified - PPO | Admitting: Rheumatology

## 2019-01-05 ENCOUNTER — Ambulatory Visit (INDEPENDENT_AMBULATORY_CARE_PROVIDER_SITE_OTHER): Payer: Federal, State, Local not specified - PPO

## 2019-01-05 ENCOUNTER — Other Ambulatory Visit: Payer: Self-pay

## 2019-01-05 ENCOUNTER — Ambulatory Visit: Payer: Self-pay

## 2019-01-05 VITALS — BP 157/88 | HR 90 | Resp 13 | Ht 62.0 in | Wt 168.0 lb

## 2019-01-05 DIAGNOSIS — M7061 Trochanteric bursitis, right hip: Secondary | ICD-10-CM

## 2019-01-05 DIAGNOSIS — G8929 Other chronic pain: Secondary | ICD-10-CM

## 2019-01-05 DIAGNOSIS — M19041 Primary osteoarthritis, right hand: Secondary | ICD-10-CM | POA: Diagnosis not present

## 2019-01-05 DIAGNOSIS — I10 Essential (primary) hypertension: Secondary | ICD-10-CM

## 2019-01-05 DIAGNOSIS — N182 Chronic kidney disease, stage 2 (mild): Secondary | ICD-10-CM

## 2019-01-05 DIAGNOSIS — Z8719 Personal history of other diseases of the digestive system: Secondary | ICD-10-CM

## 2019-01-05 DIAGNOSIS — E559 Vitamin D deficiency, unspecified: Secondary | ICD-10-CM

## 2019-01-05 DIAGNOSIS — I739 Peripheral vascular disease, unspecified: Secondary | ICD-10-CM

## 2019-01-05 DIAGNOSIS — M7062 Trochanteric bursitis, left hip: Secondary | ICD-10-CM

## 2019-01-05 DIAGNOSIS — M545 Low back pain, unspecified: Secondary | ICD-10-CM

## 2019-01-05 DIAGNOSIS — M1712 Unilateral primary osteoarthritis, left knee: Secondary | ICD-10-CM

## 2019-01-05 DIAGNOSIS — E7800 Pure hypercholesterolemia, unspecified: Secondary | ICD-10-CM

## 2019-01-05 DIAGNOSIS — E119 Type 2 diabetes mellitus without complications: Secondary | ICD-10-CM

## 2019-01-05 DIAGNOSIS — M533 Sacrococcygeal disorders, not elsewhere classified: Secondary | ICD-10-CM

## 2019-01-05 DIAGNOSIS — M160 Bilateral primary osteoarthritis of hip: Secondary | ICD-10-CM

## 2019-01-05 DIAGNOSIS — M19042 Primary osteoarthritis, left hand: Secondary | ICD-10-CM

## 2019-01-05 DIAGNOSIS — E1022 Type 1 diabetes mellitus with diabetic chronic kidney disease: Secondary | ICD-10-CM

## 2019-01-05 DIAGNOSIS — Z95828 Presence of other vascular implants and grafts: Secondary | ICD-10-CM

## 2019-01-05 DIAGNOSIS — C50212 Malignant neoplasm of upper-inner quadrant of left female breast: Secondary | ICD-10-CM

## 2019-01-05 DIAGNOSIS — Z170 Estrogen receptor positive status [ER+]: Secondary | ICD-10-CM

## 2019-01-05 DIAGNOSIS — E785 Hyperlipidemia, unspecified: Secondary | ICD-10-CM

## 2019-01-05 DIAGNOSIS — R7611 Nonspecific reaction to tuberculin skin test without active tuberculosis: Secondary | ICD-10-CM

## 2019-01-05 DIAGNOSIS — Z8739 Personal history of other diseases of the musculoskeletal system and connective tissue: Secondary | ICD-10-CM

## 2019-01-05 DIAGNOSIS — T50A95A Adverse effect of other bacterial vaccines, initial encounter: Secondary | ICD-10-CM

## 2019-01-05 NOTE — Patient Instructions (Signed)
Back Exercises The following exercises strengthen the muscles that help to support the back. They also help to keep the lower back flexible. Doing these exercises can help to prevent back pain or lessen existing pain. If you have back pain or discomfort, try doing these exercises 2-3 times each day or as told by your health care provider. When the pain goes away, do them once each day, but increase the number of times that you repeat the steps for each exercise (do more repetitions). If you do not have back pain or discomfort, do these exercises once each day or as told by your health care provider. Exercises Single Knee to Chest Repeat these steps 3-5 times for each leg: 1. Lie on your back on a firm bed or the floor with your legs extended. 2. Bring one knee to your chest. Your other leg should stay extended and in contact with the floor. 3. Hold your knee in place by grabbing your knee or thigh. 4. Pull on your knee until you feel a gentle stretch in your lower back. 5. Hold the stretch for 10-30 seconds. 6. Slowly release and straighten your leg. Pelvic Tilt Repeat these steps 5-10 times: 1. Lie on your back on a firm bed or the floor with your legs extended. 2. Bend your knees so they are pointing toward the ceiling and your feet are flat on the floor. 3. Tighten your lower abdominal muscles to press your lower back against the floor. This motion will tilt your pelvis so your tailbone points up toward the ceiling instead of pointing to your feet or the floor. 4. With gentle tension and even breathing, hold this position for 5-10 seconds. Cat-Cow Repeat these steps until your lower back becomes more flexible: 1. Get into a hands-and-knees position on a firm surface. Keep your hands under your shoulders, and keep your knees under your hips. You may place padding under your knees for comfort. 2. Let your head hang down, and point your tailbone toward the floor so your lower back becomes  rounded like the back of a cat. 3. Hold this position for 5 seconds. 4. Slowly lift your head and point your tailbone up toward the ceiling so your back forms a sagging arch like the back of a cow. 5. Hold this position for 5 seconds.  Press-Ups Repeat these steps 5-10 times: 1. Lie on your abdomen (face-down) on the floor. 2. Place your palms near your head, about shoulder-width apart. 3. While you keep your back as relaxed as possible and keep your hips on the floor, slowly straighten your arms to raise the top half of your body and lift your shoulders. Do not use your back muscles to raise your upper torso. You may adjust the placement of your hands to make yourself more comfortable. 4. Hold this position for 5 seconds while you keep your back relaxed. 5. Slowly return to lying flat on the floor.  Bridges Repeat these steps 10 times: 1. Lie on your back on a firm surface. 2. Bend your knees so they are pointing toward the ceiling and your feet are flat on the floor. 3. Tighten your buttocks muscles and lift your buttocks off of the floor until your waist is at almost the same height as your knees. You should feel the muscles working in your buttocks and the back of your thighs. If you do not feel these muscles, slide your feet 1-2 inches farther away from your buttocks. 4. Hold this position  for 3-5 seconds. 5. Slowly lower your hips to the starting position, and allow your buttocks muscles to relax completely. If this exercise is too easy, try doing it with your arms crossed over your chest. Abdominal Crunches Repeat these steps 5-10 times: 1. Lie on your back on a firm bed or the floor with your legs extended. 2. Bend your knees so they are pointing toward the ceiling and your feet are flat on the floor. 3. Cross your arms over your chest. 4. Tip your chin slightly toward your chest without bending your neck. 5. Tighten your abdominal muscles and slowly raise your trunk (torso) high  enough to lift your shoulder blades a tiny bit off of the floor. Avoid raising your torso higher than that, because it can put too much stress on your low back and it does not help to strengthen your abdominal muscles. 6. Slowly return to your starting position. Back Lifts Repeat these steps 5-10 times: 1. Lie on your abdomen (face-down) with your arms at your sides, and rest your forehead on the floor. 2. Tighten the muscles in your legs and your buttocks. 3. Slowly lift your chest off of the floor while you keep your hips pressed to the floor. Keep the back of your head in line with the curve in your back. Your eyes should be looking at the floor. 4. Hold this position for 3-5 seconds. 5. Slowly return to your starting position. Contact a health care provider if:  Your back pain or discomfort gets much worse when you do an exercise.  Your back pain or discomfort does not lessen within 2 hours after you exercise. If you have any of these problems, stop doing these exercises right away. Do not do them again unless your health care provider says that you can. Get help right away if:  You develop sudden, severe back pain. If this happens, stop doing the exercises right away. Do not do them again unless your health care provider says that you can. This information is not intended to replace advice given to you by your health care provider. Make sure you discuss any questions you have with your health care provider. Document Released: 09/03/2004 Document Revised: 11/30/2017 Document Reviewed: 09/20/2014 Elsevier Interactive Patient Education  2019 [place] [facility] Ask your health care provider which exercises are safe for you. Do exercises exactly as told by your health care provider and adjust them as directed. It is normal to feel mild stretching, pulling, tightness, or discomfort as you do these exercises, but you should stop right away if you feel sudden pain or  your pain gets worse.Do not begin these exercises until told by your health care provider. Stretching and range of motion exercises These exercises warm up your muscles and joints and improve the movement and flexibility of your hip and pelvis. Exercise A: Quadriceps, prone  1. Lie on your abdomen on a firm surface, such as a bed or padded floor. 2. Bend your left / right knee and hold your ankle. If you cannot reach your ankle or pant leg, loop a belt around your foot and grab the belt instead. 3. Gently pull your heel toward your buttocks. Your knee should not slide out to the side. You should feel a stretch in the front of your thigh and knee. 4. Hold this position for __________ seconds. Repeat __________ times. Complete this stretch __________ times a day. Exercise B: Iliotibial band  1. Lie on your side with  your left / right leg in the top position. 2. Bend both of your knees and grab your left / right ankle. Stretch out your bottom arm to help you balance. 3. Slowly bring your top knee back so your thigh goes behind your trunk. 4. Slowly lower your top leg toward the floor until you feel a gentle stretch on the outside of your left / right hip and thigh. If you do not feel a stretch and your knee will not fall farther, place the heel of your other foot on top of your knee and pull your knee down toward the floor with your foot. 5. Hold this position for __________ seconds. Repeat __________ times. Complete this stretch __________ times a day. Strengthening exercises These exercises build strength and endurance in your hip and pelvis. Endurance is the ability to use your muscles for a long time, even after they get tired. Exercise C: Straight leg raises (hip abductors)  1. Lie on your side with your left / right leg in the top position. Lie so your head, shoulder, knee, and hip line up. You may bend your bottom knee to help you balance. 2. Roll your hips slightly forward so your hips  are stacked directly over each other and your left / right knee is facing forward. 3. Tense the muscles in your outer thigh and lift your top leg 4-6 inches (10-15 cm). 4. Hold this position for __________ seconds. 5. Slowly return to the starting position. Let your muscles relax completely before doing another repetition. Repeat __________ times. Complete this exercise __________ times a day. Exercise D: Straight leg raises (hip extensors) 1. Lie on your abdomen on your bed or a firm surface. You can put a pillow under your hips if that is more comfortable. 2. Bend your left / right knee so your foot is straight up in the air. 3. Squeeze your buttock muscles and lift your left / right thigh off the bed. Do not let your back arch. 4. Tense this muscle as hard as you can without increasing any knee pain. 5. Hold this position for __________ seconds. 6. Slowly lower your leg to the starting position and allow it to relax completely. Repeat __________ times. Complete this exercise __________ times a day. Exercise E: Hip hike 1. Stand sideways on a bottom step. Stand on your left / right leg with your other foot unsupported next to the step. You can hold onto the railing or wall if needed for balance. 2. Keep your knees straight and your torso square. Then, lift your left / right hip up toward the ceiling. 3. Slowly let your left / right hip lower toward the floor, past the starting position. Your foot should get closer to the floor. Do not lean or bend your knees. Repeat __________ times. Complete this exercise __________ times a day. This information is not intended to replace advice given to you by your health care provider. Make sure you discuss any questions you have with your health care provider. Document Released: 07/27/2005 Document Revised: 03/31/2016 Document Reviewed: 06/28/2015 Elsevier Interactive Patient Education  2019 [employer].

## 2019-01-06 ENCOUNTER — Other Ambulatory Visit: Payer: Self-pay

## 2019-01-06 ENCOUNTER — Ambulatory Visit
Admission: RE | Admit: 2019-01-06 | Discharge: 2019-01-06 | Disposition: A | Discharge status: Home / Self Care | Payer: Federal, State, Local not specified - PPO | Source: Ambulatory Visit | Attending: Radiation Oncology | Admitting: Radiation Oncology

## 2019-01-06 DIAGNOSIS — C50212 Malignant neoplasm of upper-inner quadrant of left female breast: Secondary | ICD-10-CM | POA: Diagnosis present

## 2019-01-06 DIAGNOSIS — Z170 Estrogen receptor positive status [ER+]: Secondary | ICD-10-CM | POA: Diagnosis not present

## 2019-01-06 DIAGNOSIS — Z510 Encounter for antineoplastic radiation therapy: Secondary | ICD-10-CM | POA: Diagnosis not present

## 2019-01-06 NOTE — Progress Notes (Signed)
  Radiation Oncology         [phone] ________________________________  Name: Gloria Mckinney MRN: [id number]  Date: 01/06/2019  DOB: 03/20/1946  SIMULATION AND TREATMENT PLANNING NOTE    Outpatient  DIAGNOSIS:     ICD-10-CM   1. Malignant neoplasm of upper-inner quadrant of left breast in female, estrogen receptor positive ([place]) C50.212    Z17.0     NARRATIVE:  The patient was brought to the [facility].  Identity was confirmed.  All relevant records and images related to the planned course of therapy were reviewed.  The patient freely provided informed written consent to proceed with treatment after reviewing the details related to the planned course of therapy. The consent form was witnessed and verified by the simulation staff.    Then, the patient was set-up in a stable reproducible supine position for radiation therapy with her ipsilateral arm over her head, and her upper body secured in a custom-made Vac-lok device.  CT images were obtained.  Surface markings were placed.  The CT images were loaded into the planning software.    TREATMENT PLANNING NOTE: Treatment planning then occurred.  The radiation prescription was entered and confirmed.     A total of 3 medically necessary complex treatment devices were fabricated and supervised by me: 2 fields with MLCs for custom blocks to protect heart, and lungs;  and, a Vac-lok. MORE COMPLEX DEVICES MAY BE MADE IN DOSIMETRY FOR FIELD IN FIELD BEAMS FOR DOSE HOMOGENEITY.  I have requested : 3D Simulation which is medically necessary to give adequate dose to at risk tissues while sparing lungs and heart.  I have requested a DVH of the following structures: lungs, heart, left lumpectomy cavity.    The patient will receive 40.05 Gy in 15 fractions to the left breast with 2 tangential fields. This will be followed by a boost.  Optical Surface Tracking Plan:  Since intensity modulated radiotherapy (IMRT) and 3D  conformal radiation treatment methods are predicated on accurate and precise positioning for treatment, intrafraction motion monitoring is medically necessary to ensure accurate and safe treatment delivery. The ability to quantify intrafraction motion without excessive ionizing radiation dose can only be performed with optical surface tracking. Accordingly, surface imaging offers the opportunity to obtain 3D measurements of patient position throughout IMRT and 3D treatments without excessive radiation exposure. I am ordering optical surface tracking for this patient's upcoming course of radiotherapy.  ________________________________   Reference:  Ursula Alert, J, et al. Surface imaging-based analysis of intrafraction motion for breast radiotherapy patients.Journal of [place], n. 6, nov. 2014. ISSN [id number].  Available at: <http://www.jacmp.org/index.php/jacmp/article/view/4957>.    -----------------------------------  Eppie Gibson, MD

## 2019-01-08 NOTE — Progress Notes (Signed)
Patient Care Team: [name], MD as PCP - General (Family Medicine) Nicholas Lose, MD as Consulting Physician (Hematology and Oncology) Delice Bison, Charlestine Massed, NP as Nurse Practitioner (Hematology and Oncology) Fanny Skates, MD as Consulting Physician (General Surgery)  DIAGNOSIS:    ICD-10-CM   1. Malignant neoplasm of upper-inner quadrant of left breast in female, estrogen receptor positive ([place]) C50.212    Z17.0     SUMMARY OF ONCOLOGIC HISTORY:   Malignant neoplasm of upper-inner quadrant of left breast in female, estrogen receptor positive ([place])   07/18/2018 Initial Diagnosis    Screening detected left breast mass at 10 o'clock position 1.8 cm axilla negative, biopsy revealed grade 3 IDC ER 80%, PR 70%, Ki-67 40%, HER-2 +3+ by IHC with heterogeneity, T1c N0 stage Ia clinical stage    08/15/2018 Surgery    Left Lumpectomy: IDC grade 3, 1.9 cm, Posterior margin positive (Muscle) due to a transected satellite nodule 0.5 cm, 0/3 LN Neg, ER 80%, PR 70%, Ki-67 40%, HER-2 +3+ by IHC with heterogeneity T1cN0 Stage 1A    08/24/2018 Cancer Staging    Staging form: Breast, AJCC 8th Edition - Pathologic: Stage IA (pT1b, pN0, cM0, G3, ER+, PR+, HER2+) - Signed by Gardenia Phlegm, NP on 08/24/2018    09/09/2018 -  Chemotherapy    The patient had trastuzumab (HERCEPTIN) 300 mg in sodium chloride 0.9 % 250 mL chemo infusion, 294 mg, Intravenous,  Once, 4 of 16 cycles Administration: 300 mg (09/09/2018), 150 mg (09/19/2018), 150 mg (10/10/2018), 450 mg (12/19/2018), 150 mg (09/26/2018), 150 mg (10/03/2018), 150 mg (10/17/2018), 150 mg (10/24/2018), 150 mg (10/31/2018), 150 mg (11/07/2018), 150 mg (11/14/2018), 150 mg (11/21/2018), 450 mg (11/28/2018) PACLitaxel (TAXOL) 150 mg in sodium chloride 0.9 % 250 mL chemo infusion (</= 81m/m2), 80 mg/m2 = 150 mg, Intravenous,  Once, 3 of 3 cycles Dose modification: 60 mg/m2 (original dose 80 mg/m2, Cycle 2, Reason: Dose not tolerated) Administration: 150  mg (09/09/2018), 150 mg (09/19/2018), 150 mg (10/10/2018), 150 mg (09/26/2018), 150 mg (10/03/2018), 114 mg (10/17/2018), 114 mg (10/24/2018), 114 mg (10/31/2018), 114 mg (11/07/2018), 114 mg (11/14/2018), 114 mg (11/21/2018), 114 mg (11/28/2018)  for chemotherapy treatment.      CHIEF COMPLIANT: Follow-up of Herceptin maintenance.   INTERVAL HISTORY: Gloria Mckinney a 73y.o. with above-mentioned history of left breast cancer treated with lumpectomy who completed adjuvant chemotherapy with weekly Taxol and Herceptin and is currently on Herceptin maintenance. ECHO on 11/29/18 showed an ejection fraction in the range of 55-60%. She presents to the clinic today for treatment.   REVIEW OF SYSTEMS:   Constitutional: Denies fevers, chills or abnormal weight loss Eyes: Denies blurriness of vision Ears, nose, mouth, throat, and face: Denies mucositis or sore throat Respiratory: Denies cough, dyspnea or wheezes Cardiovascular: Denies palpitation, chest discomfort Gastrointestinal: Denies nausea, heartburn or change in bowel habits Skin: Denies abnormal skin rashes Lymphatics: Denies new lymphadenopathy or easy bruising Neurological: Denies numbness, tingling or new weaknesses Behavioral/Psych: Mood is stable, no new changes  Extremities: No lower extremity edema Breast: denies any pain or lumps or nodules in either breasts All other systems were reviewed with the patient and are negative.  I have reviewed the past medical history, past surgical history, social history and family history with the patient and they are unchanged from previous note.  ALLERGIES:  is allergic to penicillins; cephalexin; and statins.  MEDICATIONS:  Current Outpatient Medications  Medication Sig Dispense Refill  . aspirin EC 81 MG tablet  Take 81 mg by mouth daily.    . Calcium Carb-Cholecalciferol (CALCIUM + VITAMIN D3 PO) Take 1 tablet by mouth daily.    . Empagliflozin-metFORMIN HCl (SYNJARDY) 12.12-998 MG TABS  Take 1 tablet by mouth 2 (two) times daily.    Marland Kitchen EXFORGE 5-320 MG tablet Take 1 tablet by mouth daily.    . ferrous sulfate 325 (65 FE) MG tablet Take 325 mg by mouth daily with breakfast.    . hydrochlorothiazide (HYDRODIURIL) 25 MG tablet Take 25 mg by mouth daily.    Marland Kitchen HYDROcodone-acetaminophen (NORCO) 5-325 MG tablet Take 1-2 tablets by mouth every 6 (six) hours as needed for moderate pain or severe pain. (Patient not taking: Reported on 12/28/2018) 20 tablet 0  . Insulin Glargine, 1 Unit Dial, (TOUJEO SOLOSTAR) 300 UNIT/ML SOPN Inject 50 Units into the skin every evening.    . lidocaine-prilocaine (EMLA) cream Apply to affected area once 30 g 3  . magic mouthwash w/lidocaine SOLN Take 5 mLs by mouth 3 (three) times daily as needed for mouth pain. Recipe: 1 part lidocaine, 1 part Maalox, 1 part Diphenhydramine. (Patient not taking: Reported on 01/05/2019) 100 mL 0  . meloxicam (MOBIC) 15 MG tablet     . mirtazapine (REMERON) 7.5 MG tablet Take 1 tablet (7.5 mg total) by mouth at bedtime. (Patient not taking: Reported on 01/05/2019) 30 tablet 0  . omeprazole (PRILOSEC) 40 MG capsule Take 40 mg by mouth every morning.     . ondansetron (ZOFRAN) 8 MG tablet Take 1 tablet (8 mg total) by mouth 2 (two) times daily as needed (Nausea or vomiting). (Patient not taking: Reported on 12/28/2018) 30 tablet 1  . prochlorperazine (COMPAZINE) 10 MG tablet Take 1 tablet (10 mg total) by mouth every 6 (six) hours as needed (Nausea or vomiting). (Patient not taking: Reported on 12/28/2018) 30 tablet 1  . simvastatin (ZOCOR) 40 MG tablet Take 40 mg by mouth daily.    . traMADol (ULTRAM) 50 MG tablet as needed.      No current facility-administered medications for this visit.    Facility-Administered Medications Ordered in Other Visits  Medication Dose Route Frequency Provider Last Rate Last Dose  . sodium chloride flush (NS) 0.9 % injection 10 mL  10 mL Intravenous PRN Nicholas Lose, MD   10 mL at 09/09/18 1114     PHYSICAL EXAMINATION: ECOG PERFORMANCE STATUS: 1 - Symptomatic but completely ambulatory  Vitals:   01/09/19 1153  BP: (!) 173/68  Pulse: 86  Resp: 16  Temp: 98 F (36.7 C)  SpO2: 100%   Filed Weights   01/09/19 1153  Weight: 167 lb (75.8 kg)    GENERAL: alert, no distress and comfortable SKIN: skin color, texture, turgor are normal, no rashes or significant lesions EYES: normal, Conjunctiva are pink and non-injected, sclera clear OROPHARYNX: no exudate, no erythema and lips, buccal mucosa, and tongue normal  NECK: supple, thyroid normal size, non-tender, without nodularity LYMPH: no palpable lymphadenopathy in the cervical, axillary or inguinal LUNGS: clear to auscultation and percussion with normal breathing effort HEART: regular rate & rhythm and no murmurs and no lower extremity edema ABDOMEN: abdomen soft, non-tender and normal bowel sounds MUSCULOSKELETAL: no cyanosis of digits and no clubbing  NEURO: alert & oriented x 3 with fluent speech, no focal motor/sensory deficits EXTREMITIES: No lower extremity edema  LABORATORY DATA:  I have reviewed the data as listed CMP Latest Ref Rng & Units 11/28/2018 11/21/2018 11/14/2018  Glucose 70 - 99 mg/dL 164(H)  186(H) 134(H)  BUN 8 - 23 mg/dL _0 Creatinine 0.44 - 1.00 mg/dL 0.91 0.88 0.85  Sodium 135 - 145 mmol/L 135 135 134(L)  Potassium 3.5 - 5.1 mmol/L 4.1 4.3 4.2  Chloride 98 - 111 mmol/L 102 103 101  CO2 22 - 32 mmol/L _1 Calcium 8.9 - 10.3 mg/dL 9.7 9.4 9.6  Total Protein 6.5 - 8.1 g/dL 6.9 6.7 7.0  Total Bilirubin 0.3 - 1.2 mg/dL 0.3 0.3 0.3  Alkaline Phos 38 - 126 U/L 72 79 72  AST 15 - 41 U/L 11(L) 16 17  ALT 0 - 44 U/L _2 Lab Results  Component Value Date   WBC 8.1 01/09/2019   HGB 10.8 (L) 01/09/2019   HCT 34.0 (L) 01/09/2019   MCV 84.0 01/09/2019   PLT 275 01/09/2019   NEUTROABS 4.6 01/09/2019    ASSESSMENT & PLAN:  Malignant neoplasm of upper-inner quadrant of left breast in  female, estrogen receptor positive ([facility]) 08/15/18:Left Lumpectomy: IDC grade 3, 1.9 cm, Posterior margin positive (Muscle) due to a transected satellite nodule 0.5 cm, 0/3 LN Neg, ER 80%, PR 70%, Ki-67 40%, HER-2 +3+ by IHC with heterogeneity T1cN0 Stage 1A  Posterior Margin: based on discussions with surgery, it is muscle and hence no further surgery is possible.  Plan:  1.Adjuvant Taxol Herceptin weekly x12 completed 11/28/2018 followed by Herceptin maintenance for 1 year 2.Adjuvant radiation therapy 3.Followed by adjuvant antiestrogen therapy --------------------------------------------------------------------------------------------------------------------------------------------------------- Current treatment: Adjuvant Herceptin Adjuvant radiation had been delayed due to COVID-19.  She will start this next Monday. Echocardiogram 11/29/2018: EF 55 to 60%  Plan is to continue Herceptin every 3 weeks to complete 1 year of maintenance therapy    No orders of the defined types were placed in this encounter.  The patient has a good understanding of the overall plan. she agrees with it. she will call with any problems that may develop before the next visit here.  Nicholas Lose, MD 01/09/2019  Julious Oka Dorshimer am acting as scribe for Dr. Nicholas Lose.  I have reviewed the above documentation for accuracy and completeness, and I agree with the above.

## 2019-01-09 ENCOUNTER — Other Ambulatory Visit: Payer: Self-pay

## 2019-01-09 ENCOUNTER — Inpatient Hospital Stay: Payer: Federal, State, Local not specified - PPO

## 2019-01-09 ENCOUNTER — Inpatient Hospital Stay (HOSPITAL_BASED_OUTPATIENT_CLINIC_OR_DEPARTMENT_OTHER): Payer: Federal, State, Local not specified - PPO | Admitting: Hematology and Oncology

## 2019-01-09 ENCOUNTER — Inpatient Hospital Stay: Payer: Federal, State, Local not specified - PPO | Attending: Hematology and Oncology

## 2019-01-09 DIAGNOSIS — C50212 Malignant neoplasm of upper-inner quadrant of left female breast: Secondary | ICD-10-CM

## 2019-01-09 DIAGNOSIS — Z79899 Other long term (current) drug therapy: Secondary | ICD-10-CM | POA: Insufficient documentation

## 2019-01-09 DIAGNOSIS — Z9221 Personal history of antineoplastic chemotherapy: Secondary | ICD-10-CM

## 2019-01-09 DIAGNOSIS — Z170 Estrogen receptor positive status [ER+]: Secondary | ICD-10-CM

## 2019-01-09 DIAGNOSIS — Z5112 Encounter for antineoplastic immunotherapy: Secondary | ICD-10-CM | POA: Diagnosis not present

## 2019-01-09 DIAGNOSIS — Z95828 Presence of other vascular implants and grafts: Secondary | ICD-10-CM

## 2019-01-09 DIAGNOSIS — Z510 Encounter for antineoplastic radiation therapy: Secondary | ICD-10-CM | POA: Diagnosis not present

## 2019-01-09 DIAGNOSIS — Z7982 Long term (current) use of aspirin: Secondary | ICD-10-CM | POA: Diagnosis not present

## 2019-01-09 DIAGNOSIS — Z794 Long term (current) use of insulin: Secondary | ICD-10-CM | POA: Insufficient documentation

## 2019-01-09 LAB — CBC WITH DIFFERENTIAL (CANCER CENTER ONLY)
Abs Immature Granulocytes: 0.03 10*3/uL (ref 0.00–0.07)
Basophils Absolute: 0 10*3/uL (ref 0.0–0.1)
Basophils Relative: 0 %
Eosinophils Absolute: 0.3 10*3/uL (ref 0.0–0.5)
Eosinophils Relative: 3 %
HCT: 34 % — ABNORMAL LOW (ref 36.0–46.0)
Hemoglobin: 10.8 g/dL — ABNORMAL LOW (ref 12.0–15.0)
Immature Granulocytes: 0 %
Lymphocytes Relative: 31 %
Lymphs Abs: 2.5 10*3/uL (ref 0.7–4.0)
MCH: 26.7 pg (ref 26.0–34.0)
MCHC: 31.8 g/dL (ref 30.0–36.0)
MCV: 84 fL (ref 80.0–100.0)
Monocytes Absolute: 0.7 10*3/uL (ref 0.1–1.0)
Monocytes Relative: 9 %
Neutro Abs: 4.6 10*3/uL (ref 1.7–7.7)
Neutrophils Relative %: 57 %
Platelet Count: 275 10*3/uL (ref 150–400)
RBC: 4.05 MIL/uL (ref 3.87–5.11)
RDW: 13.5 % (ref 11.5–15.5)
WBC Count: 8.1 10*3/uL (ref 4.0–10.5)
nRBC: 0 % (ref 0.0–0.2)

## 2019-01-09 LAB — CMP (CANCER CENTER ONLY)
ALT: 18 U/L (ref 0–44)
AST: 15 U/L (ref 15–41)
Albumin: 3.6 g/dL (ref 3.5–5.0)
Alkaline Phosphatase: 89 U/L (ref 38–126)
Anion gap: 11 (ref 5–15)
BUN: 14 mg/dL (ref 8–23)
CO2: 24 mmol/L (ref 22–32)
Calcium: 10.1 mg/dL (ref 8.9–10.3)
Chloride: 104 mmol/L (ref 98–111)
Creatinine: 0.9 mg/dL (ref 0.44–1.00)
GFR, Est AFR Am: >60 mL/min (ref >60)
GFR, Estimated: >60 mL/min (ref >60)
Glucose, Bld: 170 mg/dL — ABNORMAL HIGH (ref 70–99)
Potassium: 4.2 mmol/L (ref 3.5–5.1)
Sodium: 139 mmol/L (ref 135–145)
Total Bilirubin: 0.4 mg/dL (ref 0.3–1.2)
Total Protein: 7.1 g/dL (ref 6.5–8.1)

## 2019-01-09 MED ORDER — DIPHENHYDRAMINE HCL 25 MG PO CAPS
ORAL_CAPSULE | ORAL | Status: AC
  Filled 2019-01-09: qty 2

## 2019-01-09 MED ORDER — SODIUM CHLORIDE 0.9% FLUSH
10.0000 mL | Freq: Once | INTRAVENOUS | Status: AC
  Administered 2019-01-09: 10 mL
  Filled 2019-01-09: qty 10

## 2019-01-09 MED ORDER — SODIUM CHLORIDE 0.9% FLUSH
10.0000 mL | INTRAVENOUS | Status: DC | PRN
  Administered 2019-01-09: 10 mL
  Filled 2019-01-09: qty 10

## 2019-01-09 MED ORDER — TRASTUZUMAB CHEMO 150 MG IV SOLR
450.0000 mg | Freq: Once | INTRAVENOUS | Status: AC
  Administered 2019-01-09: 450 mg via INTRAVENOUS
  Filled 2019-01-09: qty 21.43

## 2019-01-09 MED ORDER — HEPARIN SOD (PORK) LOCK FLUSH 100 UNIT/ML IV SOLN
500.0000 [IU] | Freq: Once | INTRAVENOUS | Status: AC | PRN
  Administered 2019-01-09: 500 [IU]
  Filled 2019-01-09: qty 5

## 2019-01-09 MED ORDER — ACETAMINOPHEN 325 MG PO TABS
650.0000 mg | ORAL_TABLET | Freq: Once | ORAL | Status: AC
  Administered 2019-01-09: 650 mg via ORAL

## 2019-01-09 MED ORDER — DIPHENHYDRAMINE HCL 25 MG PO CAPS
50.0000 mg | ORAL_CAPSULE | Freq: Once | ORAL | Status: AC
  Administered 2019-01-09: 50 mg via ORAL

## 2019-01-09 MED ORDER — SODIUM CHLORIDE 0.9 % IV SOLN
Freq: Once | INTRAVENOUS | Status: AC
  Administered 2019-01-09: 13:00:00 via INTRAVENOUS
  Filled 2019-01-09: qty 250

## 2019-01-09 MED ORDER — ACETAMINOPHEN 325 MG PO TABS
ORAL_TABLET | ORAL | Status: AC
  Filled 2019-01-09: qty 2

## 2019-01-09 NOTE — Patient Instructions (Signed)

## 2019-01-09 NOTE — Patient Instructions (Signed)
[facility] Discharge Instructions for Patients Receiving Chemotherapy  Today you received the following chemotherapy agents Herceptin  To help prevent nausea and vomiting after your treatment, we encourage you to take your nausea medication as directed   If you develop nausea and vomiting that is not controlled by your nausea medication, call the clinic.   BELOW ARE SYMPTOMS THAT SHOULD BE REPORTED IMMEDIATELY:  *FEVER GREATER THAN 100.5 F  *CHILLS WITH OR WITHOUT FEVER  NAUSEA AND VOMITING THAT IS NOT CONTROLLED WITH YOUR NAUSEA MEDICATION  *UNUSUAL SHORTNESS OF BREATH  *UNUSUAL BRUISING OR BLEEDING  TENDERNESS IN MOUTH AND THROAT WITH OR WITHOUT PRESENCE OF ULCERS  *URINARY PROBLEMS  *BOWEL PROBLEMS  UNUSUAL RASH Items with * indicate a potential emergency and should be followed up as soon as possible.  Feel free to call the clinic should you have any questions or concerns. The clinic phone number is [phone].  Please show the CHEMO ALERT CARD at check-in to the Emergency Department and triage nurse.   

## 2019-01-10 ENCOUNTER — Telehealth: Payer: Self-pay | Admitting: Hematology and Oncology

## 2019-01-10 NOTE — Telephone Encounter (Signed)
I left a message regarding schedule  

## 2019-01-16 ENCOUNTER — Ambulatory Visit
Admission: RE | Admit: 2019-01-16 | Discharge: 2019-01-16 | Disposition: A | Discharge status: Home / Self Care | Payer: Federal, State, Local not specified - PPO | Source: Ambulatory Visit | Attending: Radiation Oncology | Admitting: Radiation Oncology

## 2019-01-16 ENCOUNTER — Other Ambulatory Visit: Payer: Self-pay

## 2019-01-16 DIAGNOSIS — C50212 Malignant neoplasm of upper-inner quadrant of left female breast: Secondary | ICD-10-CM

## 2019-01-16 DIAGNOSIS — Z510 Encounter for antineoplastic radiation therapy: Secondary | ICD-10-CM | POA: Diagnosis not present

## 2019-01-16 DIAGNOSIS — Z170 Estrogen receptor positive status [ER+]: Secondary | ICD-10-CM

## 2019-01-16 MED ORDER — ALRA NON-METALLIC DEODORANT (RAD-ONC)
1.0000 "application " | Freq: Once | TOPICAL | Status: AC
  Administered 2019-01-16: 1 via TOPICAL

## 2019-01-16 MED ORDER — RADIAPLEXRX EX GEL
Freq: Once | CUTANEOUS | Status: AC
  Administered 2019-01-16: 12:00:00 via TOPICAL

## 2019-01-16 NOTE — Progress Notes (Signed)
Pt here for patient teaching.  Pt given Radiation and You booklet, skin care instructions, Alra deodorant and Radiaplex gel.  Reviewed areas of pertinence such as diarrhea, fatigue, skin changes, breast tenderness and breast swelling . Pt able to give teach back of to pat skin, use unscented/gentle soap and drink plenty of water,apply Radiaplex bid, avoid applying anything to skin within 4 hours of treatment, avoid wearing an under wire bra and to use an electric razor if they must shave. Pt verbalizes understanding of information given and will contact nursing with any questions or concerns.     [email or web address]

## 2019-01-17 ENCOUNTER — Other Ambulatory Visit: Payer: Self-pay

## 2019-01-17 ENCOUNTER — Ambulatory Visit
Admission: RE | Admit: 2019-01-17 | Discharge: 2019-01-17 | Disposition: A | Discharge status: Home / Self Care | Payer: Federal, State, Local not specified - PPO | Source: Ambulatory Visit | Attending: Radiation Oncology | Admitting: Radiation Oncology

## 2019-01-17 DIAGNOSIS — Z510 Encounter for antineoplastic radiation therapy: Secondary | ICD-10-CM | POA: Diagnosis not present

## 2019-01-18 ENCOUNTER — Ambulatory Visit
Admission: RE | Admit: 2019-01-18 | Discharge: 2019-01-18 | Disposition: A | Discharge status: Home / Self Care | Payer: Federal, State, Local not specified - PPO | Source: Ambulatory Visit | Attending: Radiation Oncology | Admitting: Radiation Oncology

## 2019-01-18 ENCOUNTER — Other Ambulatory Visit: Payer: Self-pay

## 2019-01-18 DIAGNOSIS — Z510 Encounter for antineoplastic radiation therapy: Secondary | ICD-10-CM | POA: Diagnosis not present

## 2019-01-19 ENCOUNTER — Other Ambulatory Visit: Payer: Self-pay

## 2019-01-19 ENCOUNTER — Ambulatory Visit
Admission: RE | Admit: 2019-01-19 | Discharge: 2019-01-19 | Disposition: A | Discharge status: Home / Self Care | Payer: Federal, State, Local not specified - PPO | Source: Ambulatory Visit | Attending: Radiation Oncology | Admitting: Radiation Oncology

## 2019-01-19 DIAGNOSIS — Z510 Encounter for antineoplastic radiation therapy: Secondary | ICD-10-CM | POA: Diagnosis not present

## 2019-01-20 ENCOUNTER — Other Ambulatory Visit: Payer: Self-pay

## 2019-01-20 ENCOUNTER — Ambulatory Visit
Admission: RE | Admit: 2019-01-20 | Discharge: 2019-01-20 | Disposition: A | Discharge status: Home / Self Care | Payer: Federal, State, Local not specified - PPO | Source: Ambulatory Visit | Attending: Radiation Oncology | Admitting: Radiation Oncology

## 2019-01-20 DIAGNOSIS — Z510 Encounter for antineoplastic radiation therapy: Secondary | ICD-10-CM | POA: Diagnosis not present

## 2019-01-23 ENCOUNTER — Ambulatory Visit
Admission: RE | Admit: 2019-01-23 | Discharge: 2019-01-23 | Disposition: A | Discharge status: Home / Self Care | Payer: Federal, State, Local not specified - PPO | Source: Ambulatory Visit | Attending: Radiation Oncology | Admitting: Radiation Oncology

## 2019-01-23 ENCOUNTER — Other Ambulatory Visit: Payer: Self-pay

## 2019-01-23 DIAGNOSIS — Z510 Encounter for antineoplastic radiation therapy: Secondary | ICD-10-CM | POA: Diagnosis not present

## 2019-01-24 ENCOUNTER — Other Ambulatory Visit: Payer: Self-pay

## 2019-01-24 ENCOUNTER — Ambulatory Visit
Admission: RE | Admit: 2019-01-24 | Discharge: 2019-01-24 | Disposition: A | Discharge status: Home / Self Care | Payer: Federal, State, Local not specified - PPO | Source: Ambulatory Visit | Attending: Radiation Oncology | Admitting: Radiation Oncology

## 2019-01-24 DIAGNOSIS — Z510 Encounter for antineoplastic radiation therapy: Secondary | ICD-10-CM | POA: Diagnosis not present

## 2019-01-25 ENCOUNTER — Encounter: Payer: Self-pay | Admitting: Physical Therapy

## 2019-01-25 ENCOUNTER — Other Ambulatory Visit: Payer: Self-pay

## 2019-01-25 ENCOUNTER — Ambulatory Visit
Admission: RE | Admit: 2019-01-25 | Discharge: 2019-01-25 | Disposition: A | Discharge status: Home / Self Care | Payer: Federal, State, Local not specified - PPO | Source: Ambulatory Visit | Attending: Radiation Oncology | Admitting: Radiation Oncology

## 2019-01-25 ENCOUNTER — Ambulatory Visit: Payer: Federal, State, Local not specified - PPO | Attending: Rheumatology | Admitting: Physical Therapy

## 2019-01-25 DIAGNOSIS — R262 Difficulty in walking, not elsewhere classified: Secondary | ICD-10-CM | POA: Diagnosis present

## 2019-01-25 DIAGNOSIS — Z510 Encounter for antineoplastic radiation therapy: Secondary | ICD-10-CM | POA: Diagnosis not present

## 2019-01-25 DIAGNOSIS — M545 Low back pain, unspecified: Secondary | ICD-10-CM

## 2019-01-25 DIAGNOSIS — M25551 Pain in right hip: Secondary | ICD-10-CM

## 2019-01-25 NOTE — Therapy (Signed)
[facility] [place], [place], [address] Phone: [phone]   Fax:  [phone]  Physical Therapy Evaluation  Patient Details  Name: Gloria Mckinney MRN: [id number] Date of Birth: [birth year] Referring Provider (PT): Deveshwar   Encounter Date: 01/25/2019  PT End of Session - 01/25/19 1412    Visit Number  1    Date for PT Re-Evaluation  03/27/19    PT Start Time  1315    PT Stop Time  1415    PT Time Calculation (min)  60 min    Activity Tolerance  Patient tolerated treatment well    Behavior During Therapy  [facility] for tasks assessed/performed       Past Medical History:  Diagnosis Date  . GERD (gastroesophageal reflux disease)   . Hypertension   . Mixed hyperlipidemia   . OA (osteoarthritis)    knee right  . Peripheral neuropathy   . PVD (peripheral vascular disease) ([place])   . Type 2 diabetes mellitus treated with insulin [facility])    endocrinologist--- dr Providence Crosby Kumpf    Past Surgical History:  Procedure Laterality Date  . BREAST BIOPSY Bilateral 2000   benign  . BREAST LUMPECTOMY WITH RADIOACTIVE SEED AND SENTINEL LYMPH NODE BIOPSY Left 08/15/2018   Procedure: LEFT BREAST LUMPECTOMY WITH RADIOACTIVE SEED AND LEFT AXILLARY DEEP SENTINEL LYMPH NODE BIOPSY INJECT BLUE DYE LEFT BREAST;  Surgeon: Fanny Skates, MD;  Location: [place];  Service: General;  Laterality: Left;  . PORTACATH PLACEMENT Right 08/15/2018   Procedure: INSERTION PORT-A-CATH WITH ULTRASOUND;  Surgeon: Fanny Skates, MD;  Location: [place];  Service: General;  Laterality: Right;  . SHOULDER ARTHROSCOPY Right 06-16-2002   dr graves  . SHOULDER OPEN ROTATOR CUFF REPAIR Right 07-28-2000    dr Berenice Primas  . VAGINAL HYSTERECTOMY  1992   with BSO  . WRIST SURGERY  2007    There were no vitals filed for this visit.   Subjective Assessment - 01/25/19 1321    Subjective  Patient reports that  about 4-5 months ago she started feeling more pain she reports that she noticed she started having pain with bending.  She is undergoing treatment for cancer left breast.  X-rays show DDD, sponylolisthesis an, bone spurs    Limitations  House hold activities;Lifting    How long can you stand comfortably?  difficulty    Patient Stated Goals  have less pain, walk more, bend better    Currently in Pain?  Yes    Pain Score  6     Pain Location  Back    Pain Orientation  Lower;Right    Pain Descriptors / Indicators  Aching;Sharp    Pain Type  Acute pain    Pain Radiating Towards  denies    Pain Onset  More than a month ago    Pain Frequency  Constant    Aggravating Factors   standing, bending, lifting pain up to 9/10, difficulty sleeping    Pain Relieving Factors  massage helps some, lying down helps some, pain at best a 3/10    Effect of Pain on Daily Activities  difficulty with ADL's. difficulty sleeping         [facility] PT Assessment - 01/25/19 0001      Assessment   Medical Diagnosis  LBP, right hip bursitis    Referring Provider (PT)  Deveshwar    Onset Date/Surgical Date  12/25/18    Prior  Therapy  no      Precautions   Precautions  None      Balance Screen   Has the patient fallen in the past 6 months  Yes    How many times?  1    Has the patient had a decrease in activity level because of a fear of falling?   No    Is the patient reluctant to leave their home because of a fear of falling?   No      Home Environment   Additional Comments  has stairs, does cooking and cleaning      Prior Function   Level of Independence  Independent    Vocation  Retired    Leisure  walks some for exercise      Posture/Postural Control   Posture Comments  decreased lordosis, fwd head, rounded shoulders      ROM / Strength   AROM / PROM / Strength  AROM;Strength      AROM   Overall AROM Comments  Lumbar ROM decreased 75% for all motions with pain in the right low back., right hip ROM  is WFL's but with pain in the right posterior and lateral hip area      Strength   Overall Strength Comments  4-/5 with pain in the back for LE MMT      Palpation   Palpation comment  she is very tight in the lumbar parapsinals, she is very tender in the right SI area and into the right buttock and the right GT area      Ambulation/Gait   Gait Comments  slow antalgic on the right, has to do stairs one at a time                Objective measurements completed on examination: See above findings.      [facility] Adult PT Treatment/Exercise - 01/25/19 0001      Modalities   Modalities  Moist Heat      Moist Heat Therapy   Number Minutes Moist Heat  12 Minutes    Moist Heat Location  Lumbar Spine             PT Education - 01/25/19 1412    Education Details  Wms flexion    Person(s) Educated  Patient    Methods  Explanation;Demonstration;Handout    Comprehension  Verbalized understanding       PT Short Term Goals - 01/25/19 1428      PT SHORT TERM GOAL #1   Title  independent with HEP    Time  2    Period  Weeks    Status  New        PT Long Term Goals - 01/25/19 1428      PT LONG TERM GOAL #1   Title  understand posture and body mechanics    Time  8    Period  Weeks    Status  New      PT LONG TERM GOAL #2   Title  increase lumbar ROM 25%    Time  8    Period  Weeks    Status  New      PT LONG TERM GOAL #3   Title  report sleeping better by 50%    Time  8    Period  Weeks    Status  New      PT LONG TERM GOAL #4   Title  cook a meal without pain  Time  8    Period  Weeks    Status  New             Plan - 01/25/19 1413    Clinical Impression Statement  Patient reports about 6 months of LBP and right hip pain, increasing recently.  She had x-rays that showed DDD, stenosis, bone spurs and spondylolisthesis.  She also has dx of right hip bursitis.  She has limited ROM of the lumbar spine, MMT of the hips does cause right low back  and hip pain.  She has significant spasms in the lumbar paraspinals.  She is very tender in the right buttock and the right GT area.    Personal Factors and Comorbidities  Comorbidity 3+    Comorbidities  CA, chemo, radiation, GERD, DM, HTN, PVD    Stability/Clinical Decision Making  Evolving/Moderate complexity    Clinical Decision Making  Moderate    Rehab Potential  Good    PT Frequency  2x / week    PT Duration  8 weeks    PT Treatment/Interventions  ADLs/Self Care Home Management;Iontophoresis 4mg /ml Dexamethasone;Moist Heat;Ultrasound;Cryotherapy;Therapeutic activities;Therapeutic exercise;Balance training;Neuromuscular re-education;Manual techniques;Dry needling;Patient/family education    PT Next Visit Plan  slowly start core stability, no estim    Consulted and Agree with Plan of Care  Patient       Patient will benefit from skilled therapeutic intervention in order to improve the following deficits and impairments:  Abnormal gait, Improper body mechanics, Pain, Postural dysfunction, Increased muscle spasms, Decreased mobility, Decreased activity tolerance, Decreased endurance, Decreased range of motion, Decreased strength, Impaired flexibility, Difficulty walking  Visit Diagnosis: 1. Acute right-sided low back pain without sciatica   2. Pain in right hip   3. Difficulty in walking, not elsewhere classified        Problem List Patient Active Problem List   Diagnosis Date Noted  . Port-A-Cath in place 09/19/2018  . Malignant neoplasm of upper-inner quadrant of left breast in female, estrogen receptor positive ([place]) 07/27/2018  . Dyspnea on exertion 10/21/2016  . Type 2 diabetes mellitus without complication, without long-term current use of insulin ([place]) 10/21/2016  . Dyslipidemia 10/21/2016    Sumner Boast., PT 01/25/2019, 2:31 PM  [place] [place] [address], [place], [address] Phone: [phone]    Fax:  [phone]  Name: Gloria Mckinney MRN: [id number] Date of Birth: [birth year]

## 2019-01-26 ENCOUNTER — Other Ambulatory Visit: Payer: Self-pay

## 2019-01-26 ENCOUNTER — Ambulatory Visit
Admission: RE | Admit: 2019-01-26 | Discharge: 2019-01-26 | Disposition: A | Discharge status: Home / Self Care | Payer: Federal, State, Local not specified - PPO | Source: Ambulatory Visit | Attending: Radiation Oncology | Admitting: Radiation Oncology

## 2019-01-26 DIAGNOSIS — Z510 Encounter for antineoplastic radiation therapy: Secondary | ICD-10-CM | POA: Diagnosis not present

## 2019-01-27 ENCOUNTER — Ambulatory Visit
Admission: RE | Admit: 2019-01-27 | Discharge: 2019-01-27 | Disposition: A | Discharge status: Home / Self Care | Payer: Federal, State, Local not specified - PPO | Source: Ambulatory Visit | Attending: Radiation Oncology | Admitting: Radiation Oncology

## 2019-01-27 ENCOUNTER — Other Ambulatory Visit: Payer: Self-pay

## 2019-01-27 DIAGNOSIS — Z510 Encounter for antineoplastic radiation therapy: Secondary | ICD-10-CM | POA: Diagnosis not present

## 2019-01-30 ENCOUNTER — Other Ambulatory Visit: Payer: Self-pay

## 2019-01-30 ENCOUNTER — Ambulatory Visit
Admission: RE | Admit: 2019-01-30 | Discharge: 2019-01-30 | Disposition: A | Discharge status: Home / Self Care | Payer: Federal, State, Local not specified - PPO | Source: Ambulatory Visit | Attending: Radiation Oncology | Admitting: Radiation Oncology

## 2019-01-30 ENCOUNTER — Inpatient Hospital Stay: Payer: Federal, State, Local not specified - PPO

## 2019-01-30 ENCOUNTER — Encounter: Payer: Self-pay | Admitting: *Deleted

## 2019-01-30 VITALS — BP 174/79 | HR 83 | Temp 98.0°F | Resp 18

## 2019-01-30 DIAGNOSIS — C50212 Malignant neoplasm of upper-inner quadrant of left female breast: Secondary | ICD-10-CM

## 2019-01-30 DIAGNOSIS — Z170 Estrogen receptor positive status [ER+]: Secondary | ICD-10-CM

## 2019-01-30 DIAGNOSIS — Z510 Encounter for antineoplastic radiation therapy: Secondary | ICD-10-CM | POA: Diagnosis not present

## 2019-01-30 MED ORDER — SODIUM CHLORIDE 0.9 % IV SOLN
Freq: Once | INTRAVENOUS | Status: AC
  Administered 2019-01-30: 12:00:00 via INTRAVENOUS
  Filled 2019-01-30: qty 250

## 2019-01-30 MED ORDER — DIPHENHYDRAMINE HCL 25 MG PO CAPS
ORAL_CAPSULE | ORAL | Status: AC
  Filled 2019-01-30: qty 2

## 2019-01-30 MED ORDER — DIPHENHYDRAMINE HCL 25 MG PO CAPS
50.0000 mg | ORAL_CAPSULE | Freq: Once | ORAL | Status: AC
  Administered 2019-01-30: 50 mg via ORAL

## 2019-01-30 MED ORDER — SODIUM CHLORIDE 0.9% FLUSH
10.0000 mL | INTRAVENOUS | Status: DC | PRN
  Administered 2019-01-30: 10 mL
  Filled 2019-01-30: qty 10

## 2019-01-30 MED ORDER — ACETAMINOPHEN 325 MG PO TABS
650.0000 mg | ORAL_TABLET | Freq: Once | ORAL | Status: AC
  Administered 2019-01-30: 650 mg via ORAL

## 2019-01-30 MED ORDER — TRASTUZUMAB CHEMO 150 MG IV SOLR
450.0000 mg | Freq: Once | INTRAVENOUS | Status: AC
  Administered 2019-01-30: 450 mg via INTRAVENOUS
  Filled 2019-01-30: qty 21.43

## 2019-01-30 MED ORDER — ACETAMINOPHEN 325 MG PO TABS
ORAL_TABLET | ORAL | Status: AC
  Filled 2019-01-30: qty 2

## 2019-01-30 MED ORDER — HEPARIN SOD (PORK) LOCK FLUSH 100 UNIT/ML IV SOLN
500.0000 [IU] | Freq: Once | INTRAVENOUS | Status: AC | PRN
  Administered 2019-01-30: 500 [IU]
  Filled 2019-01-30: qty 5

## 2019-01-30 NOTE — Patient Instructions (Signed)
[facility] Discharge Instructions for Patients Receiving Chemotherapy  Today you received the following chemotherapy agents Herceptin  To help prevent nausea and vomiting after your treatment, we encourage you to take your nausea medication as directed   If you develop nausea and vomiting that is not controlled by your nausea medication, call the clinic.   BELOW ARE SYMPTOMS THAT SHOULD BE REPORTED IMMEDIATELY:  *FEVER GREATER THAN 100.5 F  *CHILLS WITH OR WITHOUT FEVER  NAUSEA AND VOMITING THAT IS NOT CONTROLLED WITH YOUR NAUSEA MEDICATION  *UNUSUAL SHORTNESS OF BREATH  *UNUSUAL BRUISING OR BLEEDING  TENDERNESS IN MOUTH AND THROAT WITH OR WITHOUT PRESENCE OF ULCERS  *URINARY PROBLEMS  *BOWEL PROBLEMS  UNUSUAL RASH Items with * indicate a potential emergency and should be followed up as soon as possible.  Feel free to call the clinic should you have any questions or concerns. The clinic phone number is [phone].  Please show the CHEMO ALERT CARD at check-in to the Emergency Department and triage nurse.   

## 2019-01-31 ENCOUNTER — Other Ambulatory Visit: Payer: Self-pay

## 2019-01-31 ENCOUNTER — Ambulatory Visit
Admission: RE | Admit: 2019-01-31 | Discharge: 2019-01-31 | Disposition: A | Discharge status: Home / Self Care | Payer: Federal, State, Local not specified - PPO | Source: Ambulatory Visit | Attending: Radiation Oncology | Admitting: Radiation Oncology

## 2019-01-31 DIAGNOSIS — Z510 Encounter for antineoplastic radiation therapy: Secondary | ICD-10-CM | POA: Diagnosis not present

## 2019-02-01 ENCOUNTER — Ambulatory Visit
Admission: RE | Admit: 2019-02-01 | Discharge: 2019-02-01 | Disposition: A | Discharge status: Home / Self Care | Payer: Federal, State, Local not specified - PPO | Source: Ambulatory Visit | Attending: Radiation Oncology | Admitting: Radiation Oncology

## 2019-02-01 ENCOUNTER — Ambulatory Visit: Payer: Federal, State, Local not specified - PPO | Admitting: Physical Therapy

## 2019-02-01 ENCOUNTER — Encounter: Payer: Self-pay | Admitting: Physical Therapy

## 2019-02-01 ENCOUNTER — Other Ambulatory Visit: Payer: Self-pay

## 2019-02-01 DIAGNOSIS — M545 Low back pain, unspecified: Secondary | ICD-10-CM

## 2019-02-01 DIAGNOSIS — M25551 Pain in right hip: Secondary | ICD-10-CM

## 2019-02-01 DIAGNOSIS — R262 Difficulty in walking, not elsewhere classified: Secondary | ICD-10-CM

## 2019-02-01 DIAGNOSIS — Z510 Encounter for antineoplastic radiation therapy: Secondary | ICD-10-CM | POA: Diagnosis not present

## 2019-02-01 NOTE — Therapy (Signed)
[place], [place], [address] Phone: [phone]   Fax:  [phone]  Physical Therapy Treatment  Patient Details  Name: Gloria Mckinney MRN: [id number] Date of Birth: [birth year] Referring Provider (PT): Deveshwar   Encounter Date: 02/01/2019  PT End of Session - 02/01/19 1410    Visit Number  2    Date for PT Re-Evaluation  03/27/19    PT Start Time  9030    PT Stop Time  1410   heat   PT Time Calculation (min)  57 min    Activity Tolerance  Patient tolerated treatment well    Behavior During Therapy  [facility] for tasks assessed/performed       Past Medical History:  Diagnosis Date  . GERD (gastroesophageal reflux disease)   . Hypertension   . Mixed hyperlipidemia   . OA (osteoarthritis)    knee right  . Peripheral neuropathy   . PVD (peripheral vascular disease) ([place])   . Type 2 diabetes mellitus treated with insulin [facility])    endocrinologist--- dr Providence Crosby [place]    Past Surgical History:  Procedure Laterality Date  . BREAST BIOPSY Bilateral 2000   benign  . BREAST LUMPECTOMY WITH RADIOACTIVE SEED AND SENTINEL LYMPH NODE BIOPSY Left 08/15/2018   Procedure: LEFT BREAST LUMPECTOMY WITH RADIOACTIVE SEED AND LEFT AXILLARY DEEP SENTINEL LYMPH NODE BIOPSY INJECT BLUE DYE LEFT BREAST;  Surgeon: Fanny Skates, MD;  Location: [place];  Service: General;  Laterality: Left;  . PORTACATH PLACEMENT Right 08/15/2018   Procedure: INSERTION PORT-A-CATH WITH ULTRASOUND;  Surgeon: Fanny Skates, MD;  Location: [place];  Service: General;  Laterality: Right;  . SHOULDER ARTHROSCOPY Right 06-16-2002   dr graves  . SHOULDER OPEN ROTATOR CUFF REPAIR Right 07-28-2000    dr Berenice Primas  . VAGINAL HYSTERECTOMY  1992   with BSO  . WRIST SURGERY  2007    There were no vitals filed for this visit.  Subjective Assessment - 02/01/19 1314    Subjective  Patient reports no  changes in her pain.    Currently in Pain?  Yes    Pain Score  5     Pain Location  Back    Pain Orientation  Lower;Right    Aggravating Factors   activity                       [facility] Adult PT Treatment/Exercise - 02/01/19 0001      Exercises   Exercises  Lumbar      Lumbar Exercises: Stretches   Passive Hamstring Stretch  4 reps;20 seconds    Lower Trunk Rotation  3 reps;10 seconds    Piriformis Stretch  4 reps;20 seconds      Lumbar Exercises: Aerobic   Nustep  level 3 x 5 minutes      Lumbar Exercises: Seated   Other Seated Lumbar Exercises  pelvic mobility on sit fit and then scapular stabilization with red tband      Lumbar Exercises: Supine   Bridge with Ball Squeeze  15 reps;1 second    Other Supine Lumbar Exercises  feet on ball K2C, trunk rotation, small bridge and isometric abs      Modalities   Modalities  Moist Heat      Moist Heat Therapy   Number Minutes Moist Heat  10 Minutes    Moist Heat Location  Lumbar Spine  Manual Therapy   Manual Therapy  Soft tissue mobilization    Soft tissue mobilization  in left sidelying to the right Lumbar and buttock area               PT Short Term Goals - 01/25/19 1428      PT SHORT TERM GOAL #1   Title  independent with HEP    Time  2    Period  Weeks    Status  New        PT Long Term Goals - 01/25/19 1428      PT LONG TERM GOAL #1   Title  understand posture and body mechanics    Time  8    Period  Weeks    Status  New      PT LONG TERM GOAL #2   Title  increase lumbar ROM 25%    Time  8    Period  Weeks    Status  New      PT LONG TERM GOAL #3   Title  report sleeping better by 50%    Time  8    Period  Weeks    Status  New      PT LONG TERM GOAL #4   Title  cook a meal without pain    Time  8    Period  Weeks    Status  New            Plan - 02/01/19 1411    Clinical Impression Statement  Patient seemed to tolerate the exercises very well, just some  things needing cues to do correctly and especially wiht any pelvic mobility, there is a small band of rigid mms in the right buttock area that is very tender    PT Next Visit Plan  slowly start core stability, no estim    Consulted and Agree with Plan of Care  Patient       Patient will benefit from skilled therapeutic intervention in order to improve the following deficits and impairments:  Abnormal gait, Improper body mechanics, Pain, Postural dysfunction, Increased muscle spasms, Decreased mobility, Decreased activity tolerance, Decreased endurance, Decreased range of motion, Decreased strength, Impaired flexibility, Difficulty walking  Visit Diagnosis: 1. Acute right-sided low back pain without sciatica   2. Pain in right hip   3. Difficulty in walking, not elsewhere classified        Problem List Patient Active Problem List   Diagnosis Date Noted  . Port-A-Cath in place 09/19/2018  . Malignant neoplasm of upper-inner quadrant of left breast in female, estrogen receptor positive ([place]) 07/27/2018  . Dyspnea on exertion 10/21/2016  . Type 2 diabetes mellitus without complication, without long-term current use of insulin ([place]) 10/21/2016  . Dyslipidemia 10/21/2016    Sumner Boast., PT 02/01/2019, 2:12 PM  [place] [address] [place], [place], [address] Phone: [phone]   Fax:  [phone]  Name: Gloria Mckinney MRN: [id number] Date of Birth: 1946-04-28

## 2019-02-02 ENCOUNTER — Ambulatory Visit: Payer: Federal, State, Local not specified - PPO | Admitting: Physical Therapy

## 2019-02-02 ENCOUNTER — Ambulatory Visit
Admission: RE | Admit: 2019-02-02 | Discharge: 2019-02-02 | Disposition: A | Discharge status: Home / Self Care | Payer: Federal, State, Local not specified - PPO | Source: Ambulatory Visit | Attending: Radiation Oncology | Admitting: Radiation Oncology

## 2019-02-02 ENCOUNTER — Encounter: Payer: Self-pay | Admitting: Physical Therapy

## 2019-02-02 ENCOUNTER — Other Ambulatory Visit: Payer: Self-pay

## 2019-02-02 DIAGNOSIS — M25551 Pain in right hip: Secondary | ICD-10-CM

## 2019-02-02 DIAGNOSIS — Z510 Encounter for antineoplastic radiation therapy: Secondary | ICD-10-CM | POA: Diagnosis not present

## 2019-02-02 DIAGNOSIS — R262 Difficulty in walking, not elsewhere classified: Secondary | ICD-10-CM

## 2019-02-02 DIAGNOSIS — M545 Low back pain, unspecified: Secondary | ICD-10-CM

## 2019-02-02 NOTE — Therapy (Signed)
[place], [place], [address] Phone: [phone]   Fax:  [phone]  Physical Therapy Treatment  Patient Details  Name: Gloria Mckinney MRN: [id number] Date of Birth: 10-19-1945 Referring Provider (PT): Deveshwar   Encounter Date: 02/02/2019  PT End of Session - 02/02/19 1440    Visit Number  3    Date for PT Re-Evaluation  03/27/19    PT Start Time  1400    PT Stop Time  1455    PT Time Calculation (min)  55 min    Activity Tolerance  Patient tolerated treatment well    Behavior During Therapy  [facility] for tasks assessed/performed       Past Medical History:  Diagnosis Date  . GERD (gastroesophageal reflux disease)   . Hypertension   . Mixed hyperlipidemia   . OA (osteoarthritis)    knee right  . Peripheral neuropathy   . PVD (peripheral vascular disease) ([place])   . Type 2 diabetes mellitus treated with insulin [facility])    endocrinologist--- dr Providence Crosby [place]    Past Surgical History:  Procedure Laterality Date  . BREAST BIOPSY Bilateral 2000   benign  . BREAST LUMPECTOMY WITH RADIOACTIVE SEED AND SENTINEL LYMPH NODE BIOPSY Left 08/15/2018   Procedure: LEFT BREAST LUMPECTOMY WITH RADIOACTIVE SEED AND LEFT AXILLARY DEEP SENTINEL LYMPH NODE BIOPSY INJECT BLUE DYE LEFT BREAST;  Surgeon: Fanny Skates, MD;  Location: [place];  Service: General;  Laterality: Left;  . PORTACATH PLACEMENT Right 08/15/2018   Procedure: INSERTION PORT-A-CATH WITH ULTRASOUND;  Surgeon: Fanny Skates, MD;  Location: [place];  Service: General;  Laterality: Right;  . SHOULDER ARTHROSCOPY Right 06-16-2002   dr graves  . SHOULDER OPEN ROTATOR CUFF REPAIR Right 07-28-2000    dr Berenice Primas  . VAGINAL HYSTERECTOMY  1992   with BSO  . WRIST SURGERY  2007    There were no vitals filed for this visit.  Subjective Assessment - 02/02/19 1406    Subjective  Pt reports that she is ok     Patient Stated Goals  have less pain, walk more, bend better    Currently in Pain?  Yes    Pain Score  4     Pain Location  Back    Pain Orientation  Lower;Right                       [facility] Adult PT Treatment/Exercise - 02/02/19 0001      Lumbar Exercises: Stretches   Passive Hamstring Stretch  4 reps;20 seconds    Single Knee to Chest Stretch  Left;Right;4 reps;20 seconds    Piriformis Stretch  4 reps;20 seconds      Lumbar Exercises: Aerobic   Nustep  level 3x 6 minutes      Lumbar Exercises: Machines for Strengthening   Cybex Knee Extension  5lb x10     Cybex Knee Flexion  15lb x10 10lb x10        Lumbar Exercises: Standing   Row  Theraband;20 reps;Both;Strengthening    Theraband Level (Row)  Level 2 (Red)    Shoulder Extension  Theraband;20 reps;Both;Strengthening    Theraband Level (Shoulder Extension)  Level 2 (Red)    Other Standing Lumbar Exercises  Hip Ext and ABD yellow tband 2x10       Lumbar Exercises: Supine   Bridge  Non-compliant;5 reps;2 seconds      Modalities  Modalities  Moist Heat      Moist Heat Therapy   Number Minutes Moist Heat  13 Minutes    Moist Heat Location  Lumbar Spine               PT Short Term Goals - 01/25/19 1428      PT SHORT TERM GOAL #1   Title  independent with HEP    Time  2    Period  Weeks    Status  New        PT Long Term Goals - 01/25/19 1428      PT LONG TERM GOAL #1   Title  understand posture and body mechanics    Time  8    Period  Weeks    Status  New      PT LONG TERM GOAL #2   Title  increase lumbar ROM 25%    Time  8    Period  Weeks    Status  New      PT LONG TERM GOAL #3   Title  report sleeping better by 50%    Time  8    Period  Weeks    Status  New      PT LONG TERM GOAL #4   Title  cook a meal without pain    Time  8    Period  Weeks    Status  New            Plan - 02/02/19 1441    Clinical Impression Statement  Tactile cues for posture needed  with standing hip exercises to prevent leaning. LE fatigued quick with seated leg curls and extensions despite little resistance. No increase in pain with any of today's interventions. Piriformis muscles are very tight bilaterally.    Stability/Clinical Decision Making  Evolving/Moderate complexity    Rehab Potential  Good    PT Frequency  2x / week    PT Duration  8 weeks    PT Treatment/Interventions  ADLs/Self Care Home Management;Iontophoresis 4mg /ml Dexamethasone;Moist Heat;Ultrasound;Cryotherapy;Therapeutic activities;Therapeutic exercise;Balance training;Neuromuscular re-education;Manual techniques;Dry needling;Patient/family education    PT Next Visit Plan  slowly start core stability, no estim       Patient will benefit from skilled therapeutic intervention in order to improve the following deficits and impairments:  Abnormal gait, Improper body mechanics, Pain, Postural dysfunction, Increased muscle spasms, Decreased mobility, Decreased activity tolerance, Decreased endurance, Decreased range of motion, Decreased strength, Impaired flexibility, Difficulty walking  Visit Diagnosis: 1. Pain in right hip   2. Difficulty in walking, not elsewhere classified   3. Acute right-sided low back pain without sciatica        Problem List Patient Active Problem List   Diagnosis Date Noted  . Port-A-Cath in place 09/19/2018  . Malignant neoplasm of upper-inner quadrant of left breast in female, estrogen receptor positive ([place]) 07/27/2018  . Dyspnea on exertion 10/21/2016  . Type 2 diabetes mellitus without complication, without long-term current use of insulin ([place]) 10/21/2016  . Dyslipidemia 10/21/2016    Scot Jun, PTA 02/02/2019, 2:44 PM  [place], [place], [address] Phone: [phone]   Fax:  [phone]  Name: Gloria Mckinney MRN: [id number] Date of Birth: 05/24/1946

## 2019-02-03 ENCOUNTER — Other Ambulatory Visit: Payer: Self-pay

## 2019-02-03 ENCOUNTER — Ambulatory Visit
Admission: RE | Admit: 2019-02-03 | Discharge: 2019-02-03 | Disposition: A | Discharge status: Home / Self Care | Payer: Federal, State, Local not specified - PPO | Source: Ambulatory Visit | Attending: Radiation Oncology | Admitting: Radiation Oncology

## 2019-02-03 DIAGNOSIS — Z510 Encounter for antineoplastic radiation therapy: Secondary | ICD-10-CM | POA: Diagnosis not present

## 2019-02-06 ENCOUNTER — Other Ambulatory Visit: Payer: Self-pay

## 2019-02-06 ENCOUNTER — Ambulatory Visit
Admission: RE | Admit: 2019-02-06 | Discharge: 2019-02-06 | Disposition: A | Discharge status: Home / Self Care | Payer: Federal, State, Local not specified - PPO | Source: Ambulatory Visit | Attending: Radiation Oncology | Admitting: Radiation Oncology

## 2019-02-06 DIAGNOSIS — Z510 Encounter for antineoplastic radiation therapy: Secondary | ICD-10-CM | POA: Diagnosis not present

## 2019-02-07 ENCOUNTER — Ambulatory Visit: Payer: Federal, State, Local not specified - PPO | Admitting: Physical Therapy

## 2019-02-07 ENCOUNTER — Ambulatory Visit
Admission: RE | Admit: 2019-02-07 | Discharge: 2019-02-07 | Disposition: A | Discharge status: Home / Self Care | Payer: Federal, State, Local not specified - PPO | Source: Ambulatory Visit | Attending: Radiation Oncology | Admitting: Radiation Oncology

## 2019-02-07 ENCOUNTER — Other Ambulatory Visit: Payer: Self-pay

## 2019-02-07 ENCOUNTER — Encounter: Payer: Self-pay | Admitting: Physical Therapy

## 2019-02-07 DIAGNOSIS — R262 Difficulty in walking, not elsewhere classified: Secondary | ICD-10-CM

## 2019-02-07 DIAGNOSIS — M545 Low back pain, unspecified: Secondary | ICD-10-CM

## 2019-02-07 DIAGNOSIS — M25551 Pain in right hip: Secondary | ICD-10-CM

## 2019-02-07 DIAGNOSIS — Z510 Encounter for antineoplastic radiation therapy: Secondary | ICD-10-CM | POA: Diagnosis not present

## 2019-02-07 NOTE — Therapy (Signed)
[place], [place], [address] Phone: [phone]   Fax:  [phone]  Physical Therapy Treatment  Patient Details  Name: Gloria Mckinney MRN: [id number] Date of Birth: 18-Aug-1945 Referring Provider (PT): Deveshwar   Encounter Date: 02/07/2019  PT End of Session - 02/07/19 1650    Visit Number  4    Date for PT Re-Evaluation  03/27/19    PT Start Time  9201    PT Stop Time  1704    PT Time Calculation (min)  49 min    Activity Tolerance  Patient tolerated treatment well    Behavior During Therapy  [facility] for tasks assessed/performed       Past Medical History:  Diagnosis Date  . GERD (gastroesophageal reflux disease)   . Hypertension   . Mixed hyperlipidemia   . OA (osteoarthritis)    knee right  . Peripheral neuropathy   . PVD (peripheral vascular disease) ([place])   . Type 2 diabetes mellitus treated with insulin [facility])    endocrinologist--- dr Providence Crosby [place]    Past Surgical History:  Procedure Laterality Date  . BREAST BIOPSY Bilateral 2000   benign  . BREAST LUMPECTOMY WITH RADIOACTIVE SEED AND SENTINEL LYMPH NODE BIOPSY Left 08/15/2018   Procedure: LEFT BREAST LUMPECTOMY WITH RADIOACTIVE SEED AND LEFT AXILLARY DEEP SENTINEL LYMPH NODE BIOPSY INJECT BLUE DYE LEFT BREAST;  Surgeon: Fanny Skates, MD;  Location: [place];  Service: General;  Laterality: Left;  . PORTACATH PLACEMENT Right 08/15/2018   Procedure: INSERTION PORT-A-CATH WITH ULTRASOUND;  Surgeon: Fanny Skates, MD;  Location: [place];  Service: General;  Laterality: Right;  . SHOULDER ARTHROSCOPY Right 06-16-2002   dr graves  . SHOULDER OPEN ROTATOR CUFF REPAIR Right 07-28-2000    dr Berenice Primas  . VAGINAL HYSTERECTOMY  1992   with BSO  . WRIST SURGERY  2007    There were no vitals filed for this visit.  Subjective Assessment - 02/07/19 1619    Subjective  Patient reports that she  feels bewtter at times but usually has pain in the low back    Currently in Pain?  Yes    Pain Score  5     Pain Location  Back    Pain Orientation  Lower    Pain Descriptors / Indicators  Aching    Pain Relieving Factors  The treatment helps some                       [facility] Adult PT Treatment/Exercise - 02/07/19 0001      Lumbar Exercises: Stretches   Passive Hamstring Stretch  4 reps;20 seconds    Single Knee to Chest Stretch  Left;Right;4 reps;20 seconds    Piriformis Stretch  4 reps;20 seconds      Lumbar Exercises: Aerobic   Nustep  level 4x 6 minutes      Lumbar Exercises: Machines for Strengthening   Cybex Knee Extension  5lb x10       Lumbar Exercises: Supine   Other Supine Lumbar Exercises  feet on ball K2C, trunk rotation, small bridge and isometric abs      Manual Therapy   Manual Therapy  Soft tissue mobilization    Soft tissue mobilization  in left sidelying to the right Lumbar and buttock area, some vibration               PT Short Term Goals -  02/07/19 1655      PT SHORT TERM GOAL #1   Title  independent with HEP    Status  Achieved        PT Long Term Goals - 01/25/19 1428      PT LONG TERM GOAL #1   Title  understand posture and body mechanics    Time  8    Period  Weeks    Status  New      PT LONG TERM GOAL #2   Title  increase lumbar ROM 25%    Time  8    Period  Weeks    Status  New      PT LONG TERM GOAL #3   Title  report sleeping better by 50%    Time  8    Period  Weeks    Status  New      PT LONG TERM GOAL #4   Title  cook a meal without pain    Time  8    Period  Weeks    Status  New            Plan - 02/07/19 1651    Clinical Impression Statement  Patient is tight in the hips for ROM, her mms in the lumbar spine are very tight, tenderness in the right buttock area.  I tried some vibration today and she seemed to tolerate this and seemed to have some palpable mm relaxation    PT Next Visit  Plan  see how she does, I checked on all the contraindications and estim is only a precaution and not contraindication if we are away from the [place], we may try this next treatment    Consulted and Agree with Plan of Care  Patient       Patient will benefit from skilled therapeutic intervention in order to improve the following deficits and impairments:  Abnormal gait, Improper body mechanics, Pain, Postural dysfunction, Increased muscle spasms, Decreased mobility, Decreased activity tolerance, Decreased endurance, Decreased range of motion, Decreased strength, Impaired flexibility, Difficulty walking  Visit Diagnosis: 1. Pain in right hip   2. Difficulty in walking, not elsewhere classified   3. Acute right-sided low back pain without sciatica        Problem List Patient Active Problem List   Diagnosis Date Noted  . Port-A-Cath in place 09/19/2018  . Malignant neoplasm of upper-inner quadrant of left breast in female, estrogen receptor positive ([place]) 07/27/2018  . Dyspnea on exertion 10/21/2016  . Type 2 diabetes mellitus without complication, without long-term current use of insulin ([place]) 10/21/2016  . Dyslipidemia 10/21/2016    Sumner Boast., PT 02/07/2019, 4:56 PM  [place] [address] [place], [place], [address] Phone: [phone]   Fax:  [phone]  Name: Gloria Mckinney MRN: [id number] Date of Birth: 13-Jun-1946

## 2019-02-08 ENCOUNTER — Other Ambulatory Visit: Payer: Self-pay

## 2019-02-08 ENCOUNTER — Ambulatory Visit
Admission: RE | Admit: 2019-02-08 | Discharge: 2019-02-08 | Disposition: A | Discharge status: Home / Self Care | Payer: Medicare Other | Source: Ambulatory Visit | Attending: Radiation Oncology | Admitting: Radiation Oncology

## 2019-02-08 DIAGNOSIS — Z510 Encounter for antineoplastic radiation therapy: Secondary | ICD-10-CM | POA: Insufficient documentation

## 2019-02-08 DIAGNOSIS — C50212 Malignant neoplasm of upper-inner quadrant of left female breast: Secondary | ICD-10-CM | POA: Diagnosis present

## 2019-02-08 DIAGNOSIS — Z170 Estrogen receptor positive status [ER+]: Secondary | ICD-10-CM | POA: Diagnosis not present

## 2019-02-09 ENCOUNTER — Ambulatory Visit
Admission: RE | Admit: 2019-02-09 | Discharge: 2019-02-09 | Disposition: A | Discharge status: Home / Self Care | Payer: Medicare Other | Source: Ambulatory Visit | Attending: Radiation Oncology | Admitting: Radiation Oncology

## 2019-02-09 ENCOUNTER — Ambulatory Visit: Payer: Medicare Other | Attending: Rheumatology | Admitting: Physical Therapy

## 2019-02-09 ENCOUNTER — Encounter: Payer: Self-pay | Admitting: Physical Therapy

## 2019-02-09 ENCOUNTER — Ambulatory Visit: Payer: Federal, State, Local not specified - PPO | Admitting: Rheumatology

## 2019-02-09 ENCOUNTER — Ambulatory Visit: Payer: Medicare Other | Admitting: Physical Therapy

## 2019-02-09 ENCOUNTER — Other Ambulatory Visit: Payer: Self-pay

## 2019-02-09 DIAGNOSIS — M25551 Pain in right hip: Secondary | ICD-10-CM

## 2019-02-09 DIAGNOSIS — M545 Low back pain, unspecified: Secondary | ICD-10-CM

## 2019-02-09 DIAGNOSIS — Z510 Encounter for antineoplastic radiation therapy: Secondary | ICD-10-CM | POA: Diagnosis not present

## 2019-02-09 DIAGNOSIS — R262 Difficulty in walking, not elsewhere classified: Secondary | ICD-10-CM | POA: Diagnosis present

## 2019-02-09 NOTE — Therapy (Signed)
[place], [place], [address] Phone: [phone]   Fax:  [phone]  Physical Therapy Treatment  Patient Details  Name: Gloria Mckinney MRN: [id number] Date of Birth: 05/23/1946 Referring Provider (PT): Deveshwar   Encounter Date: 02/09/2019  PT End of Session - 02/09/19 1646    Visit Number  5    Date for PT Re-Evaluation  03/27/19    PT Start Time  1608    PT Stop Time  1657    PT Time Calculation (min)  49 min    Activity Tolerance  Patient tolerated treatment well    Behavior During Therapy  [facility] for tasks assessed/performed       Past Medical History:  Diagnosis Date  . GERD (gastroesophageal reflux disease)   . Hypertension   . Mixed hyperlipidemia   . OA (osteoarthritis)    knee right  . Peripheral neuropathy   . PVD (peripheral vascular disease) ([place])   . Type 2 diabetes mellitus treated with insulin [facility])    endocrinologist--- dr Providence Crosby [place]    Past Surgical History:  Procedure Laterality Date  . BREAST BIOPSY Bilateral 2000   benign  . BREAST LUMPECTOMY WITH RADIOACTIVE SEED AND SENTINEL LYMPH NODE BIOPSY Left 08/15/2018   Procedure: LEFT BREAST LUMPECTOMY WITH RADIOACTIVE SEED AND LEFT AXILLARY DEEP SENTINEL LYMPH NODE BIOPSY INJECT BLUE DYE LEFT BREAST;  Surgeon: Fanny Skates, MD;  Location: [place];  Service: General;  Laterality: Left;  . PORTACATH PLACEMENT Right 08/15/2018   Procedure: INSERTION PORT-A-CATH WITH ULTRASOUND;  Surgeon: Fanny Skates, MD;  Location: [place];  Service: General;  Laterality: Right;  . SHOULDER ARTHROSCOPY Right 06-16-2002   dr graves  . SHOULDER OPEN ROTATOR CUFF REPAIR Right 07-28-2000    dr Berenice Primas  . VAGINAL HYSTERECTOMY  1992   with BSO  . WRIST SURGERY  2007    There were no vitals filed for this visit.  Subjective Assessment - 02/09/19 1644    Subjective  Reports that the right  side is feeling better, reports some increased left sided pain today    Currently in Pain?  Yes    Pain Score  4     Pain Location  Back    Pain Orientation  Lower;Left    Aggravating Factors   standing, walking                       [facility] Adult PT Treatment/Exercise - 02/09/19 0001      Lumbar Exercises: Stretches   Passive Hamstring Stretch  4 reps;20 seconds    Lower Trunk Rotation  3 reps;10 seconds    Piriformis Stretch  4 reps;20 seconds      Lumbar Exercises: Aerobic   Nustep  level 4x 6 minutes      Lumbar Exercises: Supine   Bridge  Non-compliant;5 reps;2 seconds    Bridge with [employer]  15 reps;1 second    Other Supine Lumbar Exercises  feet on ball K2C, trunk rotation, small bridge and isometric abs      Modalities   Modalities  Electrical Stimulation;Moist Heat      Moist Heat Therapy   Number Minutes Moist Heat  14 Minutes    Moist Heat Location  Lumbar Spine      Electrical Stimulation   Electrical Stimulation Location  L/S area    Electrical Stimulation Action  IFC    Electrical  Stimulation Parameters  right side lying    Electrical Stimulation Goals  Pain      Manual Therapy   Manual Therapy  Soft tissue mobilization    Soft tissue mobilization  in left sidelying to the right Lumbar and buttock area, some vibration               PT Short Term Goals - 02/07/19 1655      PT SHORT TERM GOAL #1   Title  independent with HEP    Status  Achieved        PT Long Term Goals - 02/09/19 1648      PT LONG TERM GOAL #1   Title  understand posture and body mechanics    Status  On-going      PT LONG TERM GOAL #2   Title  increase lumbar ROM 25%    Status  On-going            Plan - 02/09/19 1647    Clinical Impression Statement  Patient has pain a little more on the left side today, she is very tight in the lumbar area, she does have some rigidity of the mms in the buttocks, some bridging causes some pain  but reports  "not bad"    PT Next Visit Plan  see how she did with the estim    Consulted and Agree with Plan of Care  Patient       Patient will benefit from skilled therapeutic intervention in order to improve the following deficits and impairments:  Abnormal gait, Improper body mechanics, Pain, Postural dysfunction, Increased muscle spasms, Decreased mobility, Decreased activity tolerance, Decreased endurance, Decreased range of motion, Decreased strength, Impaired flexibility, Difficulty walking  Visit Diagnosis: 1. Pain in right hip   2. Difficulty in walking, not elsewhere classified   3. Acute right-sided low back pain without sciatica        Problem List Patient Active Problem List   Diagnosis Date Noted  . Port-A-Cath in place 09/19/2018  . Malignant neoplasm of upper-inner quadrant of left breast in female, estrogen receptor positive ([place]) 07/27/2018  . Dyspnea on exertion 10/21/2016  . Type 2 diabetes mellitus without complication, without long-term current use of insulin ([place]) 10/21/2016  . Dyslipidemia 10/21/2016    Sumner Boast., PT 02/09/2019, 4:49 PM  [place] [address] [address] [place], [place], [address] Phone: [phone]   Fax:  [phone]  Name: Gloria Mckinney MRN: [id number] Date of Birth: 08-05-1946

## 2019-02-13 ENCOUNTER — Encounter: Payer: Self-pay | Admitting: Radiation Oncology

## 2019-02-13 ENCOUNTER — Ambulatory Visit
Admission: RE | Admit: 2019-02-13 | Discharge: 2019-02-13 | Disposition: A | Discharge status: Home / Self Care | Payer: Medicare Other | Source: Ambulatory Visit | Attending: Radiation Oncology | Admitting: Radiation Oncology

## 2019-02-13 ENCOUNTER — Other Ambulatory Visit: Payer: Self-pay

## 2019-02-13 ENCOUNTER — Encounter: Payer: Self-pay | Admitting: *Deleted

## 2019-02-13 DIAGNOSIS — Z510 Encounter for antineoplastic radiation therapy: Secondary | ICD-10-CM | POA: Diagnosis not present

## 2019-02-14 ENCOUNTER — Ambulatory Visit: Payer: Medicare Other | Admitting: Physical Therapy

## 2019-02-14 ENCOUNTER — Encounter: Payer: Self-pay | Admitting: Physical Therapy

## 2019-02-14 DIAGNOSIS — M25551 Pain in right hip: Secondary | ICD-10-CM | POA: Diagnosis not present

## 2019-02-14 DIAGNOSIS — M545 Low back pain, unspecified: Secondary | ICD-10-CM

## 2019-02-14 DIAGNOSIS — R262 Difficulty in walking, not elsewhere classified: Secondary | ICD-10-CM

## 2019-02-14 NOTE — Therapy (Signed)
[place] [address] [place], [place], [address] Phone: [phone]   Fax:  [phone]  Physical Therapy Treatment  Patient Details  Name: Gloria Mckinney MRN: [id number] Date of Birth: [birth year] Referring Provider (PT): Deveshwar   Encounter Date: 02/14/2019  PT End of Session - 02/14/19 1452    Visit Number  6    Date for PT Re-Evaluation  03/27/19    PT Start Time  8101    PT Stop Time  1504    PT Time Calculation (min)  59 min    Activity Tolerance  Patient tolerated treatment well    Behavior During Therapy  [facility] for tasks assessed/performed       Past Medical History:  Diagnosis Date  . GERD (gastroesophageal reflux disease)   . Hypertension   . Mixed hyperlipidemia   . OA (osteoarthritis)    knee right  . Peripheral neuropathy   . PVD (peripheral vascular disease) ([place])   . Type 2 diabetes mellitus treated with insulin [facility])    endocrinologist--- dr Providence Crosby [place]    Past Surgical History:  Procedure Laterality Date  . BREAST BIOPSY Bilateral 2000   benign  . BREAST LUMPECTOMY WITH RADIOACTIVE SEED AND SENTINEL LYMPH NODE BIOPSY Left 08/15/2018   Procedure: LEFT BREAST LUMPECTOMY WITH RADIOACTIVE SEED AND LEFT AXILLARY DEEP SENTINEL LYMPH NODE BIOPSY INJECT BLUE DYE LEFT BREAST;  Surgeon: Fanny Skates, MD;  Location: [place];  Service: General;  Laterality: Left;  . PORTACATH PLACEMENT Right 08/15/2018   Procedure: INSERTION PORT-A-CATH WITH ULTRASOUND;  Surgeon: Fanny Skates, MD;  Location: [place];  Service: General;  Laterality: Right;  . SHOULDER ARTHROSCOPY Right 06-16-2002   dr graves  . SHOULDER OPEN ROTATOR CUFF REPAIR Right 07-28-2000    dr Berenice Primas  . VAGINAL HYSTERECTOMY  1992   with BSO  . WRIST SURGERY  2007    There were no vitals filed for this visit.  Subjective Assessment - 02/14/19 1422    Subjective  Reports jsut feeling tight  today    Currently in Pain?  Yes    Pain Score  4     Pain Location  Back    Pain Orientation  Lower    Pain Descriptors / Indicators  Tightness    Aggravating Factors   activity                       [facility] Adult PT Treatment/Exercise - 02/14/19 0001      Lumbar Exercises: Stretches   Single Knee to Chest Stretch  Left;Right;4 reps;20 seconds    Lower Trunk Rotation  3 reps;10 seconds    Piriformis Stretch  4 reps;20 seconds      Lumbar Exercises: Aerobic   Nustep  level 5x 6 minutes      Lumbar Exercises: Machines for Strengthening   Other Lumbar Machine Exercise  seated row, lats 15# 2x10      Lumbar Exercises: Standing   Other Standing Lumbar Exercises  small side bends      Lumbar Exercises: Supine   Bridge with Ball Squeeze  15 reps;1 second    Other Supine Lumbar Exercises  feet on ball K2C, trunk rotation, small bridge and isometric abs      Modalities   Modalities  Electrical Stimulation;Moist Heat      Moist Heat Therapy   Number Minutes Moist Heat  12 Minutes    Moist  Heat Location  Lumbar Spine      Electrical Stimulation   Electrical Stimulation Location  L/S area    Electrical Stimulation Action  IFC    Electrical Stimulation Parameters  leftside lying    Electrical Stimulation Goals  Pain      Manual Therapy   Manual Therapy  Soft tissue mobilization    Soft tissue mobilization  to the lumbar area and into the buttocks               PT Short Term Goals - 02/07/19 1655      PT SHORT TERM GOAL #1   Title  independent with HEP    Status  Achieved        PT Long Term Goals - 02/09/19 1648      PT LONG TERM GOAL #1   Title  understand posture and body mechanics    Status  On-going      PT LONG TERM GOAL #2   Title  increase lumbar ROM 25%    Status  On-going            Plan - 02/14/19 1452    Clinical Impression Statement  Patient is moving better, easier, less difficulty with bed mobility, her lumbar  parapsinals are still very tgiht, she does not c/o tenderness just tight and sore    PT Next Visit Plan  will continue to work on core strength and see if we can decrease her pain    Consulted and Agree with Plan of Care  Patient       Patient will benefit from skilled therapeutic intervention in order to improve the following deficits and impairments:  Abnormal gait, Improper body mechanics, Pain, Postural dysfunction, Increased muscle spasms, Decreased mobility, Decreased activity tolerance, Decreased endurance, Decreased range of motion, Decreased strength, Impaired flexibility, Difficulty walking  Visit Diagnosis: 1. Pain in right hip   2. Difficulty in walking, not elsewhere classified   3. Acute right-sided low back pain without sciatica        Problem List Patient Active Problem List   Diagnosis Date Noted  . Port-A-Cath in place 09/19/2018  . Malignant neoplasm of upper-inner quadrant of left breast in female, estrogen receptor positive ([place]) 07/27/2018  . Dyspnea on exertion 10/21/2016  . Type 2 diabetes mellitus without complication, without long-term current use of insulin ([place]) 10/21/2016  . Dyslipidemia 10/21/2016    Sumner Boast., PT 02/14/2019, 2:58 PM  [place] [address] [place], [place], [address] Phone: [phone]   Fax:  [phone]  Name: Denise Washburn MRN: [id number] Date of Birth: [birth year]

## 2019-02-14 NOTE — Assessment & Plan Note (Signed)
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  Plan:  1.Adjuvant Taxol Herceptin weekly x12 completed 11/28/2018 followed by Herceptin maintenance for 1 year 2.Adjuvant radiation therapy 3.Followed by adjuvant antiestrogen therapy --------------------------------------------------------------------------------------------------------------------------------------------------------- Current treatment: Adjuvant Herceptin Adjuvant radiation had been delayed due to COVID-19.  She will start this next Monday. Echocardiogram 11/29/2018: EF 55 to 60%  Plan is to continue Herceptin every3weeks to complete 1 year of maintenance therapy

## 2019-02-16 ENCOUNTER — Ambulatory Visit: Payer: Medicare Other | Admitting: Physical Therapy

## 2019-02-16 ENCOUNTER — Other Ambulatory Visit: Payer: Self-pay

## 2019-02-16 ENCOUNTER — Encounter: Payer: Self-pay | Admitting: Physical Therapy

## 2019-02-16 DIAGNOSIS — M25551 Pain in right hip: Secondary | ICD-10-CM | POA: Diagnosis not present

## 2019-02-16 DIAGNOSIS — R262 Difficulty in walking, not elsewhere classified: Secondary | ICD-10-CM

## 2019-02-16 DIAGNOSIS — M545 Low back pain, unspecified: Secondary | ICD-10-CM

## 2019-02-16 NOTE — Therapy (Signed)
[place], [place], [address] Phone: [phone]   Fax:  [phone]  Physical Therapy Treatment  Patient Details  Name: Gloria Mckinney MRN: [id number] Date of Birth: 1946/03/06 Referring Provider (PT): Deveshwar   Encounter Date: 02/16/2019  PT End of Session - 02/16/19 1442    Visit Number  7    Date for PT Re-Evaluation  03/27/19    PT Start Time  1406    PT Stop Time  1454    PT Time Calculation (min)  48 min    Activity Tolerance  Patient tolerated treatment well    Behavior During Therapy  [facility] for tasks assessed/performed       Past Medical History:  Diagnosis Date  . GERD (gastroesophageal reflux disease)   . Hypertension   . Mixed hyperlipidemia   . OA (osteoarthritis)    knee right  . Peripheral neuropathy   . PVD (peripheral vascular disease) ([place])   . Type 2 diabetes mellitus treated with insulin [facility])    endocrinologist--- dr Providence Crosby [place]    Past Surgical History:  Procedure Laterality Date  . BREAST BIOPSY Bilateral 2000   benign  . BREAST LUMPECTOMY WITH RADIOACTIVE SEED AND SENTINEL LYMPH NODE BIOPSY Left 08/15/2018   Procedure: LEFT BREAST LUMPECTOMY WITH RADIOACTIVE SEED AND LEFT AXILLARY DEEP SENTINEL LYMPH NODE BIOPSY INJECT BLUE DYE LEFT BREAST;  Surgeon: Fanny Skates, MD;  Location: [place];  Service: General;  Laterality: Left;  . PORTACATH PLACEMENT Right 08/15/2018   Procedure: INSERTION PORT-A-CATH WITH ULTRASOUND;  Surgeon: Fanny Skates, MD;  Location: [employer];  Service: General;  Laterality: Right;  . SHOULDER ARTHROSCOPY Right 06-16-2002   dr graves  . SHOULDER OPEN ROTATOR CUFF REPAIR Right 07-28-2000    dr Berenice Primas  . VAGINAL HYSTERECTOMY  1992   with BSO  . WRIST SURGERY  2007    There were no vitals filed for this visit.  Subjective Assessment - 02/16/19 1407    Subjective  Patient reports feeling  better.  Less pain, "tight in the " right low back    Currently in Pain?  Yes    Pain Location  Back    Pain Orientation  Right;Lower                       [facility] Adult PT Treatment/Exercise - 02/16/19 0001      Lumbar Exercises: Stretches   Passive Hamstring Stretch  4 reps;20 seconds    Lower Trunk Rotation  3 reps;10 seconds    ITB Stretch  3 reps;10 seconds    Piriformis Stretch  4 reps;20 seconds      Lumbar Exercises: Aerobic   UBE (Upper Arm Bike)  level 1 x 4 minutes      Lumbar Exercises: Machines for Strengthening   Cybex Knee Extension  5lb 2x10     Cybex Knee Flexion  20# 2x10    Other Lumbar Machine Exercise  seated row, lats 15# 2x10      Lumbar Exercises: Seated   Other Seated Lumbar Exercises  pelvic mobility on sit fit and then scapular stabilization with red tband      Lumbar Exercises: Supine   Other Supine Lumbar Exercises  feet on ball K2C, trunk rotation, small bridge and isometric abs      Modalities   Modalities  Electrical Stimulation;Moist Heat      Moist Heat Therapy  Number Minutes Moist Heat  10 Minutes    Moist Heat Location  Lumbar Spine      Electrical Stimulation   Electrical Stimulation Location  right gluteal area    Electrical Stimulation Action  IFC    Electrical Stimulation Parameters  left sidelying    Electrical Stimulation Goals  Pain      Manual Therapy   Manual Therapy  Soft tissue mobilization    Soft tissue mobilization  to the right gluteal area               PT Short Term Goals - 02/07/19 1655      PT SHORT TERM GOAL #1   Title  independent with HEP    Status  Achieved        PT Long Term Goals - 02/16/19 1445      PT LONG TERM GOAL #1   Title  understand posture and body mechanics    Status  On-going      PT LONG TERM GOAL #2   Title  increase lumbar ROM 25%    Status  On-going      PT LONG TERM GOAL #3   Title  report sleeping better by 50%    Status  On-going      PT LONG  TERM GOAL #4   Title  cook a meal without pain    Status  On-going            Plan - 02/16/19 1443    Clinical Impression Statement  Patient reports doing okay, she is moving better a slight limp on the right, she is very tender in this area today, the mms of the gluteus medius and maximus seem to be in spasms, tried some aggressive STM but she was very tender    PT Next Visit Plan  may try DN    Consulted and Agree with Plan of Care  Patient       Patient will benefit from skilled therapeutic intervention in order to improve the following deficits and impairments:  Abnormal gait, Improper body mechanics, Pain, Postural dysfunction, Increased muscle spasms, Decreased mobility, Decreased activity tolerance, Decreased endurance, Decreased range of motion, Decreased strength, Impaired flexibility, Difficulty walking  Visit Diagnosis: 1. Pain in right hip   2. Difficulty in walking, not elsewhere classified   3. Acute right-sided low back pain without sciatica        Problem List Patient Active Problem List   Diagnosis Date Noted  . Port-A-Cath in place 09/19/2018  . Malignant neoplasm of upper-inner quadrant of left breast in female, estrogen receptor positive ([place]) 07/27/2018  . Dyspnea on exertion 10/21/2016  . Type 2 diabetes mellitus without complication, without long-term current use of insulin ([place]) 10/21/2016  . Dyslipidemia 10/21/2016    Sumner Boast., PT 02/16/2019, 2:49 PM  [place] [address] [place], [place], [address] Phone: [phone]   Fax:  [phone]  Name: Gloria Mckinney MRN: [id number] Date of Birth: [birth year]

## 2019-02-16 NOTE — Patient Instructions (Signed)

## 2019-02-18 NOTE — Progress Notes (Signed)
Patient Care Team: [name], MD as PCP - General (Family Medicine) Nicholas Lose, MD as Consulting Physician (Hematology and Oncology) Delice Bison, Charlestine Massed, NP as Nurse Practitioner (Hematology and Oncology) Fanny Skates, MD as Consulting Physician (General Surgery)  DIAGNOSIS:    ICD-10-CM   1. Malignant neoplasm of upper-inner quadrant of left breast in female, estrogen receptor positive ([place])  C50.212    Z17.0     SUMMARY OF ONCOLOGIC HISTORY: Oncology History  Malignant neoplasm of upper-inner quadrant of left breast in female, estrogen receptor positive ([place])  07/18/2018 Initial Diagnosis   Screening detected left breast mass at 10 o'clock position 1.8 cm axilla negative, biopsy revealed grade 3 IDC ER 80%, PR 70%, Ki-67 40%, HER-2 +3+ by IHC with heterogeneity, T1c N0 stage Ia clinical stage   08/15/2018 Surgery   Left Lumpectomy: IDC grade 3, 1.9 cm, Posterior margin positive (Muscle) due to a transected satellite nodule 0.5 cm, 0/3 LN Neg, ER 80%, PR 70%, Ki-67 40%, HER-2 +3+ by IHC with heterogeneity T1cN0 Stage 1A   08/24/2018 Cancer Staging   Staging form: Breast, AJCC 8th Edition - Pathologic: Stage IA (pT1b, pN0, cM0, G3, ER+, PR+, HER2+) - Signed by Gardenia Phlegm, NP on 08/24/2018   09/09/2018 -  Chemotherapy   The patient had trastuzumab (HERCEPTIN) 300 mg in sodium chloride 0.9 % 250 mL chemo infusion, 294 mg, Intravenous,  Once, 6 of 16 cycles Administration: 300 mg (09/09/2018), 150 mg (09/19/2018), 150 mg (10/10/2018), 450 mg (12/19/2018), 450 mg (01/09/2019), 150 mg (09/26/2018), 150 mg (10/03/2018), 150 mg (10/17/2018), 150 mg (10/24/2018), 150 mg (10/31/2018), 150 mg (11/07/2018), 150 mg (11/14/2018), 150 mg (11/21/2018), 450 mg (11/28/2018), 450 mg (01/30/2019) PACLitaxel (TAXOL) 150 mg in sodium chloride 0.9 % 250 mL chemo infusion (</= 40m/m2), 80 mg/m2 = 150 mg, Intravenous,  Once, 3 of 3 cycles Dose modification: 60 mg/m2 (original dose 80 mg/m2, Cycle 2,  Reason: Dose not tolerated) Administration: 150 mg (09/09/2018), 150 mg (09/19/2018), 150 mg (10/10/2018), 150 mg (09/26/2018), 150 mg (10/03/2018), 114 mg (10/17/2018), 114 mg (10/24/2018), 114 mg (10/31/2018), 114 mg (11/07/2018), 114 mg (11/14/2018), 114 mg (11/21/2018), 114 mg (11/28/2018)  for chemotherapy treatment.    01/17/2019 - 02/13/2019 Radiation Therapy   Adjuvant XRT     CHIEF COMPLIANT: Follow-up of Herceptin maintenance  INTERVAL HISTORY: Houda Rameshchandra PBobeckis a 73y.o. with above-mentioned history of left breast cancer treated with lumpectomy, adjuvant chemotherapy, radiation, and who is currently on Herceptin maintenance. She presents to the clinic today for treatment.   She completed radiation last week.  She does feel tired.  REVIEW OF SYSTEMS:   Constitutional: Denies fevers, chills or abnormal weight loss Eyes: Denies blurriness of vision Ears, nose, mouth, throat, and face: Denies mucositis or sore throat Respiratory: Denies cough, dyspnea or wheezes Cardiovascular: Denies palpitation, chest discomfort Gastrointestinal: Denies nausea, heartburn or change in bowel habits Skin: Denies abnormal skin rashes Lymphatics: Denies new lymphadenopathy or easy bruising Neurological: Denies numbness, tingling or new weaknesses Behavioral/Psych: Mood is stable, no new changes  Extremities: No lower extremity edema Breast: Post radiation changes in the breast All other systems were reviewed with the patient and are negative.  I have reviewed the past medical history, past surgical history, social history and family history with the patient and they are unchanged from previous note.  ALLERGIES:  is allergic to penicillins; cephalexin; and statins.  MEDICATIONS:  Current Outpatient Medications  Medication Sig Dispense Refill  . aspirin EC 81 MG tablet  Take 81 mg by mouth daily.    . Calcium Carb-Cholecalciferol (CALCIUM + VITAMIN D3 PO) Take 1 tablet by mouth daily.    .  Empagliflozin-metFORMIN HCl (SYNJARDY) 12.12-998 MG TABS Take 1 tablet by mouth 2 (two) times daily.    Marland Kitchen EXFORGE 5-320 MG tablet Take 1 tablet by mouth daily.    . ferrous sulfate 325 (65 FE) MG tablet Take 325 mg by mouth daily with breakfast.    . hydrochlorothiazide (HYDRODIURIL) 25 MG tablet Take 25 mg by mouth daily.    Marland Kitchen HYDROcodone-acetaminophen (NORCO) 5-325 MG tablet Take 1-2 tablets by mouth every 6 (six) hours as needed for moderate pain or severe pain. (Patient not taking: Reported on 12/28/2018) 20 tablet 0  . Insulin Glargine, 1 Unit Dial, (TOUJEO SOLOSTAR) 300 UNIT/ML SOPN Inject 50 Units into the skin every evening.    . lidocaine-prilocaine (EMLA) cream Apply to affected area once 30 g 3  . magic mouthwash w/lidocaine SOLN Take 5 mLs by mouth 3 (three) times daily as needed for mouth pain. Recipe: 1 part lidocaine, 1 part Maalox, 1 part Diphenhydramine. (Patient not taking: Reported on 01/05/2019) 100 mL 0  . meloxicam (MOBIC) 15 MG tablet     . mirtazapine (REMERON) 7.5 MG tablet Take 1 tablet (7.5 mg total) by mouth at bedtime. (Patient not taking: Reported on 01/05/2019) 30 tablet 0  . omeprazole (PRILOSEC) 40 MG capsule Take 40 mg by mouth every morning.     . ondansetron (ZOFRAN) 8 MG tablet Take 1 tablet (8 mg total) by mouth 2 (two) times daily as needed (Nausea or vomiting). (Patient not taking: Reported on 12/28/2018) 30 tablet 1  . prochlorperazine (COMPAZINE) 10 MG tablet Take 1 tablet (10 mg total) by mouth every 6 (six) hours as needed (Nausea or vomiting). (Patient not taking: Reported on 12/28/2018) 30 tablet 1  . simvastatin (ZOCOR) 40 MG tablet Take 40 mg by mouth daily.    . traMADol (ULTRAM) 50 MG tablet as needed.      No current facility-administered medications for this visit.    Facility-Administered Medications Ordered in Other Visits  Medication Dose Route Frequency Provider Last Rate Last Dose  . sodium chloride flush (NS) 0.9 % injection 10 mL  10 mL  Intravenous PRN Nicholas Lose, MD   10 mL at 09/09/18 1114    PHYSICAL EXAMINATION: ECOG PERFORMANCE STATUS: 1 - Symptomatic but completely ambulatory  Vitals:   02/20/19 1057  BP: (!) 162/64  Pulse: 89  Resp: 17  Temp: 98.3 F (36.8 C)  SpO2: 100%   Filed Weights   02/20/19 1057  Weight: 167 lb 14.4 oz (76.2 kg)    GENERAL: alert, no distress and comfortable SKIN: skin color, texture, turgor are normal, no rashes or significant lesions EYES: normal, Conjunctiva are pink and non-injected, sclera clear OROPHARYNX: no exudate, no erythema and lips, buccal mucosa, and tongue normal  NECK: supple, thyroid normal size, non-tender, without nodularity LYMPH: no palpable lymphadenopathy in the cervical, axillary or inguinal LUNGS: clear to auscultation and percussion with normal breathing effort HEART: regular rate & rhythm and no murmurs and no lower extremity edema ABDOMEN: abdomen soft, non-tender and normal bowel sounds MUSCULOSKELETAL: no cyanosis of digits and no clubbing  NEURO: alert & oriented x 3 with fluent speech, no focal motor/sensory deficits EXTREMITIES: No lower extremity edema  LABORATORY DATA:  I have reviewed the data as listed CMP Latest Ref Rng & Units 01/09/2019 11/28/2018 11/21/2018  Glucose 70 - 99  mg/dL 170(H) 164(H) 186(H)  BUN 8 - 23 mg/dL 14 20 13   Creatinine 0.44 - 1.00 mg/dL 0.90 0.91 0.88  Sodium 135 - 145 mmol/L 139 135 135  Potassium 3.5 - 5.1 mmol/L 4.2 4.1 4.3  Chloride 98 - 111 mmol/L 104 102 103  CO2 22 - 32 mmol/L 24 22 22   Calcium 8.9 - 10.3 mg/dL 10.1 9.7 9.4  Total Protein 6.5 - 8.1 g/dL 7.1 6.9 6.7  Total Bilirubin 0.3 - 1.2 mg/dL 0.4 0.3 0.3  Alkaline Phos 38 - 126 U/L 89 72 79  AST 15 - 41 U/L 15 11(L) 16  ALT 0 - 44 U/L 18 17 20     Lab Results  Component Value Date   WBC 6.7 02/20/2019   HGB 10.6 (L) 02/20/2019   HCT 34.8 (L) 02/20/2019   MCV 80.9 02/20/2019   PLT 266 02/20/2019   NEUTROABS 4.1 02/20/2019    ASSESSMENT &  PLAN:  Malignant neoplasm of upper-inner quadrant of left breast in female, estrogen receptor positive ([facility]) 08/15/18:Left Lumpectomy: IDC grade 3, 1.9 cm, Posterior margin positive (Muscle) due to a transected satellite nodule 0.5 cm, 0/3 LN Neg, ER 80%, PR 70%, Ki-67 40%, HER-2 +3+ by IHC with heterogeneity T1cN0 Stage 1A  Posterior Margin: based on discussions with surgery, it is muscle and hence no further surgery is possible.  Plan:  1.Adjuvant Taxol Herceptin weekly x12 completed 11/28/2018 followed by Herceptin maintenance for 1 year 2.Adjuvant radiation therapy completed 02/13/2019 3.Followed by adjuvant antiestrogen therapy --------------------------------------------------------------------------------------------------------------------------------------------------------- Current treatment: Adjuvant Herceptin Adjuvant radiation had been delayed due to COVID-19.  She will start this next Monday. Echocardiogram 11/29/2018: EF 55 to 60%  Patient would like to move her appointment at 11 AM for each of her treatments.  I will request this change and if it is available she will be moved. Plan is to continue Herceptin every3weeks to complete 1 year of maintenance therapy    No orders of the defined types were placed in this encounter.  The patient has a good understanding of the overall plan. she agrees with it. she will call with any problems that may develop before the next visit here.  Nicholas Lose, MD 02/20/2019  Julious Oka Dorshimer am acting as scribe for Dr. Nicholas Lose.  I have reviewed the above documentation for accuracy and completeness, and I agree with the above.

## 2019-02-20 ENCOUNTER — Inpatient Hospital Stay (HOSPITAL_BASED_OUTPATIENT_CLINIC_OR_DEPARTMENT_OTHER): Payer: Medicare Other | Admitting: Hematology and Oncology

## 2019-02-20 ENCOUNTER — Inpatient Hospital Stay: Payer: Medicare Other

## 2019-02-20 ENCOUNTER — Other Ambulatory Visit: Payer: Self-pay

## 2019-02-20 ENCOUNTER — Inpatient Hospital Stay: Payer: Medicare Other | Attending: Hematology and Oncology

## 2019-02-20 ENCOUNTER — Other Ambulatory Visit: Payer: Self-pay | Admitting: *Deleted

## 2019-02-20 DIAGNOSIS — C50212 Malignant neoplasm of upper-inner quadrant of left female breast: Secondary | ICD-10-CM

## 2019-02-20 DIAGNOSIS — Z9221 Personal history of antineoplastic chemotherapy: Secondary | ICD-10-CM | POA: Insufficient documentation

## 2019-02-20 DIAGNOSIS — Z7982 Long term (current) use of aspirin: Secondary | ICD-10-CM | POA: Diagnosis not present

## 2019-02-20 DIAGNOSIS — Z923 Personal history of irradiation: Secondary | ICD-10-CM

## 2019-02-20 DIAGNOSIS — Z79899 Other long term (current) drug therapy: Secondary | ICD-10-CM | POA: Insufficient documentation

## 2019-02-20 DIAGNOSIS — Z170 Estrogen receptor positive status [ER+]: Secondary | ICD-10-CM

## 2019-02-20 DIAGNOSIS — Z794 Long term (current) use of insulin: Secondary | ICD-10-CM

## 2019-02-20 DIAGNOSIS — Z5112 Encounter for antineoplastic immunotherapy: Secondary | ICD-10-CM | POA: Diagnosis not present

## 2019-02-20 DIAGNOSIS — Z95828 Presence of other vascular implants and grafts: Secondary | ICD-10-CM

## 2019-02-20 LAB — CBC WITH DIFFERENTIAL (CANCER CENTER ONLY)
Abs Immature Granulocytes: 0.02 10*3/uL (ref 0.00–0.07)
Basophils Absolute: 0.1 10*3/uL (ref 0.0–0.1)
Basophils Relative: 1 %
Eosinophils Absolute: 0.2 10*3/uL (ref 0.0–0.5)
Eosinophils Relative: 3 %
HCT: 34.8 % — ABNORMAL LOW (ref 36.0–46.0)
Hemoglobin: 10.6 g/dL — ABNORMAL LOW (ref 12.0–15.0)
Immature Granulocytes: 0 %
Lymphocytes Relative: 26 %
Lymphs Abs: 1.8 10*3/uL (ref 0.7–4.0)
MCH: 24.7 pg — ABNORMAL LOW (ref 26.0–34.0)
MCHC: 30.5 g/dL (ref 30.0–36.0)
MCV: 80.9 fL (ref 80.0–100.0)
Monocytes Absolute: 0.6 10*3/uL (ref 0.1–1.0)
Monocytes Relative: 9 %
Neutro Abs: 4.1 10*3/uL (ref 1.7–7.7)
Neutrophils Relative %: 61 %
Platelet Count: 266 10*3/uL (ref 150–400)
RBC: 4.3 MIL/uL (ref 3.87–5.11)
RDW: 13.1 % (ref 11.5–15.5)
WBC Count: 6.7 10*3/uL (ref 4.0–10.5)
nRBC: 0 % (ref 0.0–0.2)

## 2019-02-20 LAB — CMP (CANCER CENTER ONLY)
ALT: 16 U/L (ref 0–44)
AST: 14 U/L — ABNORMAL LOW (ref 15–41)
Albumin: 3.7 g/dL (ref 3.5–5.0)
Alkaline Phosphatase: 104 U/L (ref 38–126)
Anion gap: 10 (ref 5–15)
BUN: 17 mg/dL (ref 8–23)
CO2: 21 mmol/L — ABNORMAL LOW (ref 22–32)
Calcium: 9.8 mg/dL (ref 8.9–10.3)
Chloride: 105 mmol/L (ref 98–111)
Creatinine: 0.9 mg/dL (ref 0.44–1.00)
GFR, Est AFR Am: >60 mL/min (ref >60)
GFR, Estimated: >60 mL/min (ref >60)
Glucose, Bld: 196 mg/dL — ABNORMAL HIGH (ref 70–99)
Potassium: 4.6 mmol/L (ref 3.5–5.1)
Sodium: 136 mmol/L (ref 135–145)
Total Bilirubin: 0.2 mg/dL — ABNORMAL LOW (ref 0.3–1.2)
Total Protein: 7.3 g/dL (ref 6.5–8.1)

## 2019-02-20 MED ORDER — TRASTUZUMAB CHEMO 150 MG IV SOLR
450.0000 mg | Freq: Once | INTRAVENOUS | Status: AC
  Administered 2019-02-20: 450 mg via INTRAVENOUS
  Filled 2019-02-20: qty 21.43

## 2019-02-20 MED ORDER — SODIUM CHLORIDE 0.9% FLUSH
10.0000 mL | INTRAVENOUS | Status: DC | PRN
  Administered 2019-02-20: 10 mL
  Filled 2019-02-20: qty 10

## 2019-02-20 MED ORDER — SODIUM CHLORIDE 0.9 % IV SOLN
Freq: Once | INTRAVENOUS | Status: AC
  Administered 2019-02-20: 12:00:00 via INTRAVENOUS
  Filled 2019-02-20: qty 250

## 2019-02-20 MED ORDER — DIPHENHYDRAMINE HCL 25 MG PO CAPS
50.0000 mg | ORAL_CAPSULE | Freq: Once | ORAL | Status: AC
  Administered 2019-02-20: 50 mg via ORAL

## 2019-02-20 MED ORDER — ACETAMINOPHEN 325 MG PO TABS
ORAL_TABLET | ORAL | Status: AC
  Filled 2019-02-20: qty 2

## 2019-02-20 MED ORDER — HEPARIN SOD (PORK) LOCK FLUSH 100 UNIT/ML IV SOLN
500.0000 [IU] | Freq: Once | INTRAVENOUS | Status: AC | PRN
  Administered 2019-02-20: 500 [IU]
  Filled 2019-02-20: qty 5

## 2019-02-20 MED ORDER — SODIUM CHLORIDE 0.9% FLUSH
10.0000 mL | Freq: Once | INTRAVENOUS | Status: AC
  Administered 2019-02-20: 10 mL
  Filled 2019-02-20: qty 10

## 2019-02-20 MED ORDER — ACETAMINOPHEN 325 MG PO TABS
650.0000 mg | ORAL_TABLET | Freq: Once | ORAL | Status: AC
  Administered 2019-02-20: 650 mg via ORAL

## 2019-02-20 MED ORDER — DIPHENHYDRAMINE HCL 25 MG PO CAPS
ORAL_CAPSULE | ORAL | Status: AC
  Filled 2019-02-20: qty 2

## 2019-02-20 NOTE — Patient Instructions (Signed)
[facility] Discharge Instructions for Patients Receiving Chemotherapy  Today you received the following chemotherapy agents Herceptin  To help prevent nausea and vomiting after your treatment, we encourage you to take your nausea medication as directed   If you develop nausea and vomiting that is not controlled by your nausea medication, call the clinic.   BELOW ARE SYMPTOMS THAT SHOULD BE REPORTED IMMEDIATELY:  *FEVER GREATER THAN 100.5 F  *CHILLS WITH OR WITHOUT FEVER  NAUSEA AND VOMITING THAT IS NOT CONTROLLED WITH YOUR NAUSEA MEDICATION  *UNUSUAL SHORTNESS OF BREATH  *UNUSUAL BRUISING OR BLEEDING  TENDERNESS IN MOUTH AND THROAT WITH OR WITHOUT PRESENCE OF ULCERS  *URINARY PROBLEMS  *BOWEL PROBLEMS  UNUSUAL RASH Items with * indicate a potential emergency and should be followed up as soon as possible.  Feel free to call the clinic should you have any questions or concerns. The clinic phone number is [phone].  Please show the CHEMO ALERT CARD at check-in to the Emergency Department and triage nurse.   

## 2019-02-21 ENCOUNTER — Telehealth: Payer: Self-pay | Admitting: Hematology and Oncology

## 2019-02-21 ENCOUNTER — Encounter: Payer: Self-pay | Admitting: Physical Therapy

## 2019-02-21 ENCOUNTER — Ambulatory Visit: Payer: Medicare Other | Admitting: Physical Therapy

## 2019-02-21 DIAGNOSIS — R262 Difficulty in walking, not elsewhere classified: Secondary | ICD-10-CM

## 2019-02-21 DIAGNOSIS — M25551 Pain in right hip: Secondary | ICD-10-CM

## 2019-02-21 DIAGNOSIS — M545 Low back pain, unspecified: Secondary | ICD-10-CM

## 2019-02-21 NOTE — Therapy (Signed)
[address] [place] [place], [place], [address] Phone: [phone]   Fax:  [phone]  Physical Therapy Treatment  Patient Details  Name: Gloria Mckinney MRN: [id number] Date of Birth: [birth year] Referring Provider (PT): Deveshwar   Encounter Date: 02/21/2019  PT End of Session - 02/21/19 1525    Visit Number  8    Date for PT Re-Evaluation  03/27/19    PT Start Time  1448    PT Stop Time  1541    PT Time Calculation (min)  53 min    Activity Tolerance  Patient tolerated treatment well    Behavior During Therapy  [facility] for tasks assessed/performed       Past Medical History:  Diagnosis Date  . GERD (gastroesophageal reflux disease)   . Hypertension   . Mixed hyperlipidemia   . OA (osteoarthritis)    knee right  . Peripheral neuropathy   . PVD (peripheral vascular disease) ([place])   . Type 2 diabetes mellitus treated with insulin [facility])    endocrinologist--- dr Providence Crosby [place]    Past Surgical History:  Procedure Laterality Date  . BREAST BIOPSY Bilateral 2000   benign  . BREAST LUMPECTOMY WITH RADIOACTIVE SEED AND SENTINEL LYMPH NODE BIOPSY Left 08/15/2018   Procedure: LEFT BREAST LUMPECTOMY WITH RADIOACTIVE SEED AND LEFT AXILLARY DEEP SENTINEL LYMPH NODE BIOPSY INJECT BLUE DYE LEFT BREAST;  Surgeon: Fanny Skates, MD;  Location: [place];  Service: General;  Laterality: Left;  . PORTACATH PLACEMENT Right 08/15/2018   Procedure: INSERTION PORT-A-CATH WITH ULTRASOUND;  Surgeon: Fanny Skates, MD;  Location: [place];  Service: General;  Laterality: Right;  . SHOULDER ARTHROSCOPY Right 06-16-2002   dr graves  . SHOULDER OPEN ROTATOR CUFF REPAIR Right 07-28-2000    dr Berenice Primas  . VAGINAL HYSTERECTOMY  1992   with BSO  . WRIST SURGERY  2007    There were no vitals filed for this visit.  Subjective Assessment - 02/21/19 1523    Subjective  I can walk better with  less pain in the back    Currently in Pain?  Yes    Pain Score  3     Pain Location  Back    Pain Orientation  Right;Lower    Aggravating Factors   reports standing is worse than walking now                       [facility] Adult PT Treatment/Exercise - 02/21/19 0001      Lumbar Exercises: Aerobic   Nustep  level 5x 6 minutes      Lumbar Exercises: Machines for Strengthening   Cybex Knee Extension  5lb 2x10     Other Lumbar Machine Exercise  seated row, lats 15# 2x10      Lumbar Exercises: Standing   Other Standing Lumbar Exercises  2.5# ankle weights hip flexion, abduction and extension 2x10 each      Lumbar Exercises: Seated   Other Seated Lumbar Exercises  pelvic mobility on sit fit and then scapular stabilization with red tband      Lumbar Exercises: Supine   Other Supine Lumbar Exercises  feet on ball K2C, trunk rotation, small bridge and isometric abs      Modalities   Modalities  Electrical Stimulation;Moist Heat      Moist Heat Therapy   Number Minutes Moist Heat  10 Minutes    Moist Heat Location  Lumbar Spine      Electrical Stimulation   Electrical Stimulation Location  right gluteal area    Electrical Stimulation Action  IFC    Electrical Stimulation Parameters  left sidelying    Electrical Stimulation Goals  Pain      Manual Therapy   Manual Therapy  Soft tissue mobilization    Soft tissue mobilization  to the right gluteal area, into the right ITB and bilateral lumbar parapsinals               PT Short Term Goals - 02/07/19 1655      PT SHORT TERM GOAL #1   Title  independent with HEP    Status  Achieved        PT Long Term Goals - 02/16/19 1445      PT LONG TERM GOAL #1   Title  understand posture and body mechanics    Status  On-going      PT LONG TERM GOAL #2   Title  increase lumbar ROM 25%    Status  On-going      PT LONG TERM GOAL #3   Title  report sleeping better by 50%    Status  On-going      PT LONG TERM  GOAL #4   Title  cook a meal without pain    Status  On-going            Plan - 02/21/19 1526    Clinical Impression Statement  Patient reports that there is a lot less pain with walking, reports that standing still bothers her with pain, she is still very tight in the right lumbar and buttock area, some cues for posture with exercies    PT Next Visit Plan  could try DN but is progressing with currentPOC    Consulted and Agree with Plan of Care  Patient       Patient will benefit from skilled therapeutic intervention in order to improve the following deficits and impairments:  Abnormal gait, Improper body mechanics, Pain, Postural dysfunction, Increased muscle spasms, Decreased mobility, Decreased activity tolerance, Decreased endurance, Decreased range of motion, Decreased strength, Impaired flexibility, Difficulty walking  Visit Diagnosis: 1. Pain in right hip   2. Difficulty in walking, not elsewhere classified   3. Acute right-sided low back pain without sciatica        Problem List Patient Active Problem List   Diagnosis Date Noted  . Port-A-Cath in place 09/19/2018  . Malignant neoplasm of upper-inner quadrant of left breast in female, estrogen receptor positive ([place]) 07/27/2018  . Dyspnea on exertion 10/21/2016  . Type 2 diabetes mellitus without complication, without long-term current use of insulin ([place]) 10/21/2016  . Dyslipidemia 10/21/2016    Sumner Boast., PT 02/21/2019, 3:27 PM  [address] [place], [place], [address] Phone: [phone]   Fax:  [phone]  Name: Gloria Mckinney MRN: [id number] Date of Birth: 1945-12-11

## 2019-02-21 NOTE — Telephone Encounter (Signed)
I called interpreter to relay the message on the voicemail that 11 am was not available and that she could schedule an earlier time or later in the afternoon for herceptions.

## 2019-02-23 ENCOUNTER — Other Ambulatory Visit: Payer: Self-pay

## 2019-02-23 ENCOUNTER — Ambulatory Visit: Payer: Medicare Other | Admitting: Physical Therapy

## 2019-02-23 ENCOUNTER — Encounter: Payer: Self-pay | Admitting: Physical Therapy

## 2019-02-23 DIAGNOSIS — R262 Difficulty in walking, not elsewhere classified: Secondary | ICD-10-CM

## 2019-02-23 DIAGNOSIS — M545 Low back pain, unspecified: Secondary | ICD-10-CM

## 2019-02-23 DIAGNOSIS — M25551 Pain in right hip: Secondary | ICD-10-CM | POA: Diagnosis not present

## 2019-02-23 NOTE — Therapy (Signed)
[place], [place], [address] Phone: [phone]   Fax:  [phone]  Physical Therapy Treatment  Patient Details  Name: Gloria Mckinney MRN: [id number] Date of Birth: 10/14/1945 Referring Provider (PT): Deveshwar   Encounter Date: 02/23/2019  PT End of Session - 02/23/19 1436    Visit Number  9    Date for PT Re-Evaluation  03/27/19    PT Start Time  1400    PT Stop Time  1450    PT Time Calculation (min)  50 min    Activity Tolerance  Patient tolerated treatment well    Behavior During Therapy  [facility] for tasks assessed/performed       Past Medical History:  Diagnosis Date  . GERD (gastroesophageal reflux disease)   . Hypertension   . Mixed hyperlipidemia   . OA (osteoarthritis)    knee right  . Peripheral neuropathy   . PVD (peripheral vascular disease) ([place])   . Type 2 diabetes mellitus treated with insulin [facility])    endocrinologist--- dr Providence Crosby [place]    Past Surgical History:  Procedure Laterality Date  . BREAST BIOPSY Bilateral 2000   benign  . BREAST LUMPECTOMY WITH RADIOACTIVE SEED AND SENTINEL LYMPH NODE BIOPSY Left 08/15/2018   Procedure: LEFT BREAST LUMPECTOMY WITH RADIOACTIVE SEED AND LEFT AXILLARY DEEP SENTINEL LYMPH NODE BIOPSY INJECT BLUE DYE LEFT BREAST;  Surgeon: Fanny Skates, MD;  Location: [place];  Service: General;  Laterality: Left;  . PORTACATH PLACEMENT Right 08/15/2018   Procedure: INSERTION PORT-A-CATH WITH ULTRASOUND;  Surgeon: Fanny Skates, MD;  Location: [place];  Service: General;  Laterality: Right;  . SHOULDER ARTHROSCOPY Right 06-16-2002   dr graves  . SHOULDER OPEN ROTATOR CUFF REPAIR Right 07-28-2000    dr Berenice Primas  . VAGINAL HYSTERECTOMY  1992   with BSO  . WRIST SURGERY  2007    There were no vitals filed for this visit.  Subjective Assessment - 02/23/19 1402    Subjective  Reports less pain overall     Currently in Pain?  Yes    Pain Score  2     Pain Location  Back    Pain Orientation  Right;Lower    Aggravating Factors   standing and cooking will increase the pain    Pain Relieving Factors  rest helps                       [facility] Adult PT Treatment/Exercise - 02/23/19 0001      Lumbar Exercises: Stretches   Passive Hamstring Stretch  4 reps;20 seconds    Lower Trunk Rotation  3 reps;10 seconds    Hip Flexor Stretch  Right;Left;3 reps;10 seconds    ITB Stretch  3 reps;10 seconds    Piriformis Stretch  4 reps;20 seconds      Lumbar Exercises: Aerobic   Nustep  level 5x 6 minutes      Lumbar Exercises: Seated   Other Seated Lumbar Exercises  pelvic mobility on sit fit and then scapular stabilization with red tband      Lumbar Exercises: Supine   Other Supine Lumbar Exercises  feet on ball K2C, trunk rotation, small bridge and isometric abs      Modalities   Modalities  Electrical Stimulation;Moist Heat      Moist Heat Therapy   Number Minutes Moist Heat  10 Minutes    Moist Heat Location  Lumbar Spine      Electrical Stimulation   Electrical Stimulation Location  right gluteal area    Electrical Stimulation Action  IFC    Electrical Stimulation Parameters   left side lying    Electrical Stimulation Goals  Pain      Manual Therapy   Manual Therapy  Soft tissue mobilization    Soft tissue mobilization  to the right gluteal area, into the right ITB and bilateral lumbar parapsinals               PT Short Term Goals - 02/07/19 1655      PT SHORT TERM GOAL #1   Title  independent with HEP    Status  Achieved        PT Long Term Goals - 02/23/19 1439      PT LONG TERM GOAL #1   Title  understand posture and body mechanics    Status  Partially Met      PT LONG TERM GOAL #2   Title  increase lumbar ROM 25%    Status  Partially Met            Plan - 02/23/19 1436    Clinical Impression Statement  Patient reports that she was  cooking and doing well, still at times has to sit and the pain will subside, there is rigidity in the right piriformis mms    PT Next Visit Plan  continue with plan    Consulted and Agree with Plan of Care  Patient       Patient will benefit from skilled therapeutic intervention in order to improve the following deficits and impairments:  Abnormal gait, Improper body mechanics, Pain, Postural dysfunction, Increased muscle spasms, Decreased mobility, Decreased activity tolerance, Decreased endurance, Decreased range of motion, Decreased strength, Impaired flexibility, Difficulty walking  Visit Diagnosis: 1. Pain in right hip   2. Difficulty in walking, not elsewhere classified   3. Acute right-sided low back pain without sciatica        Problem List Patient Active Problem List   Diagnosis Date Noted  . Port-A-Cath in place 09/19/2018  . Malignant neoplasm of upper-inner quadrant of left breast in female, estrogen receptor positive ([place]) 07/27/2018  . Dyspnea on exertion 10/21/2016  . Type 2 diabetes mellitus without complication, without long-term current use of insulin ([place]) 10/21/2016  . Dyslipidemia 10/21/2016    Sumner Boast., PT 02/23/2019, 2:39 PM  [address] [place], [place], [address] Phone: [phone]   Fax:  [phone]  Name: Gloria Mckinney MRN: [id number] Date of Birth: 1945/08/13

## 2019-02-24 ENCOUNTER — Telehealth: Payer: Self-pay | Admitting: Hematology and Oncology

## 2019-02-24 NOTE — Telephone Encounter (Signed)
Called pt per 7/16 sch message - unable to reach pts daughter  ,. Left message for patient daughter to call back

## 2019-02-27 ENCOUNTER — Other Ambulatory Visit: Payer: Self-pay

## 2019-02-27 ENCOUNTER — Ambulatory Visit (HOSPITAL_COMMUNITY)
Admission: RE | Admit: 2019-02-27 | Discharge: 2019-02-27 | Disposition: A | Discharge status: Home / Self Care | Payer: Medicare Other | Source: Ambulatory Visit | Attending: Hematology and Oncology | Admitting: Hematology and Oncology

## 2019-02-27 DIAGNOSIS — C50919 Malignant neoplasm of unspecified site of unspecified female breast: Secondary | ICD-10-CM | POA: Diagnosis not present

## 2019-02-27 DIAGNOSIS — C50212 Malignant neoplasm of upper-inner quadrant of left female breast: Secondary | ICD-10-CM

## 2019-02-27 DIAGNOSIS — E785 Hyperlipidemia, unspecified: Secondary | ICD-10-CM | POA: Insufficient documentation

## 2019-02-27 DIAGNOSIS — Z170 Estrogen receptor positive status [ER+]: Secondary | ICD-10-CM | POA: Insufficient documentation

## 2019-02-27 DIAGNOSIS — I10 Essential (primary) hypertension: Secondary | ICD-10-CM | POA: Insufficient documentation

## 2019-02-27 DIAGNOSIS — Z0189 Encounter for other specified special examinations: Secondary | ICD-10-CM | POA: Diagnosis not present

## 2019-02-27 NOTE — Progress Notes (Signed)
Echocardiogram 2D Echocardiogram has been performed.  Gloria Mckinney 02/27/2019, 11:47 AM

## 2019-02-28 ENCOUNTER — Encounter: Payer: Self-pay | Admitting: Physical Therapy

## 2019-02-28 ENCOUNTER — Ambulatory Visit: Payer: Medicare Other | Admitting: Physical Therapy

## 2019-02-28 DIAGNOSIS — M25551 Pain in right hip: Secondary | ICD-10-CM | POA: Diagnosis not present

## 2019-02-28 DIAGNOSIS — M545 Low back pain, unspecified: Secondary | ICD-10-CM

## 2019-02-28 DIAGNOSIS — R262 Difficulty in walking, not elsewhere classified: Secondary | ICD-10-CM

## 2019-02-28 NOTE — Therapy (Signed)
[place], [place], [address] Phone: [phone]   Fax:  [phone]  Physical Therapy Treatment  Patient Details  Name: Gloria Mckinney MRN: [id number] Date of Birth: 1946-07-19 Referring Provider (PT): Deveshwar   Encounter Date: 02/28/2019  PT End of Session - 02/28/19 1348    Visit Number  10    Date for PT Re-Evaluation  03/27/19    PT Start Time  1310    PT Stop Time  1400    PT Time Calculation (min)  50 min    Activity Tolerance  Patient tolerated treatment well    Behavior During Therapy  [facility] for tasks assessed/performed       Past Medical History:  Diagnosis Date  . GERD (gastroesophageal reflux disease)   . Hypertension   . Mixed hyperlipidemia   . OA (osteoarthritis)    knee right  . Peripheral neuropathy   . PVD (peripheral vascular disease) ([place])   . Type 2 diabetes mellitus treated with insulin [facility])    endocrinologist--- dr Providence Crosby [place]    Past Surgical History:  Procedure Laterality Date  . BREAST BIOPSY Bilateral 2000   benign  . BREAST LUMPECTOMY WITH RADIOACTIVE SEED AND SENTINEL LYMPH NODE BIOPSY Left 08/15/2018   Procedure: LEFT BREAST LUMPECTOMY WITH RADIOACTIVE SEED AND LEFT AXILLARY DEEP SENTINEL LYMPH NODE BIOPSY INJECT BLUE DYE LEFT BREAST;  Surgeon: Fanny Skates, MD;  Location: [place];  Service: General;  Laterality: Left;  . PORTACATH PLACEMENT Right 08/15/2018   Procedure: INSERTION PORT-A-CATH WITH ULTRASOUND;  Surgeon: Fanny Skates, MD;  Location: [place];  Service: General;  Laterality: Right;  . SHOULDER ARTHROSCOPY Right 06-16-2002   dr graves  . SHOULDER OPEN ROTATOR CUFF REPAIR Right 07-28-2000    dr Berenice Primas  . VAGINAL HYSTERECTOMY  1992   with BSO  . WRIST SURGERY  2007    There were no vitals filed for this visit.  Subjective Assessment - 02/28/19 1319    Subjective  Patient reports that  through the day she is getting better, she does report worse in the morning    Currently in Pain?  Yes    Pain Score  3     Pain Location  Back    Pain Orientation  Lower    Aggravating Factors   worse in the morning                       [facility] Adult PT Treatment/Exercise - 02/28/19 0001      Lumbar Exercises: Stretches   Passive Hamstring Stretch  4 reps;20 seconds  (Pended)     Lower Trunk Rotation  3 reps;10 seconds  (Pended)     Hip Flexor Stretch  Right;Left;3 reps;10 seconds  (Pended)     ITB Stretch  3 reps;10 seconds  (Pended)     Piriformis Stretch  4 reps;20 seconds  (Pended)       Lumbar Exercises: Aerobic   Nustep  level 5x 6 minutes  (Pended)       Lumbar Exercises: Seated   Other Seated Lumbar Exercises  pelvic mobility on sit fit and then scapular stabilization with red tband  (Pended)       Lumbar Exercises: Supine   Other Supine Lumbar Exercises  feet on ball K2C, trunk rotation, small bridge and isometric abs  (Pended)       Modalities   Modalities  Electrical  Stimulation;Moist Heat  (Pended)       Moist Heat Therapy   Number Minutes Moist Heat  10 Minutes  (Pended)     Moist Heat Location  Lumbar Spine  (Pended)       Electrical Stimulation   Electrical Stimulation Location  L/S area   (Pended)     Electrical Stimulation Action  IFC  (Pended)     Electrical Stimulation Parameters  left side lying  (Pended)     Electrical Stimulation Goals  Pain  (Pended)       Manual Therapy   Manual Therapy  Soft tissue mobilization  (Pended)     Soft tissue mobilization  to the right gluteal area, into the right ITB and bilateral lumbar parapsinals  (Pended)                PT Short Term Goals - 02/07/19 1655      PT SHORT TERM GOAL #1   Title  independent with HEP    Status  Achieved        PT Long Term Goals - 02/23/19 1439      PT LONG TERM GOAL #1   Title  understand posture and body mechanics    Status  Partially Met      PT  LONG TERM GOAL #2   Title  increase lumbar ROM 25%    Status  Partially Met            Plan - 02/28/19 1348    Clinical Impression Statement  Patient reports that during the day the pain is getting better and she is tolerating a little more, still painful mostly in the mornings.  The mms of the lumbasr area are very tight and tender.    PT Next Visit Plan  will work on the lumbar area and see if this helps    Consulted and Agree with Plan of Care  Patient       Patient will benefit from skilled therapeutic intervention in order to improve the following deficits and impairments:  Abnormal gait, Improper body mechanics, Pain, Postural dysfunction, Increased muscle spasms, Decreased mobility, Decreased activity tolerance, Decreased endurance, Decreased range of motion, Decreased strength, Impaired flexibility, Difficulty walking  Visit Diagnosis: 1. Pain in right hip   2. Acute right-sided low back pain without sciatica   3. Difficulty in walking, not elsewhere classified        Problem List Patient Active Problem List   Diagnosis Date Noted  . Port-A-Cath in place 09/19/2018  . Malignant neoplasm of upper-inner quadrant of left breast in female, estrogen receptor positive ([place]) 07/27/2018  . Dyspnea on exertion 10/21/2016  . Type 2 diabetes mellitus without complication, without long-term current use of insulin ([place]) 10/21/2016  . Dyslipidemia 10/21/2016    Sumner Boast., PT 02/28/2019, 1:49 PM  [address] [place], [place], [address] Phone: [phone]   Fax:  [phone]  Name: Gloria Mckinney MRN: [id number] Date of Birth: 1945-12-02

## 2019-03-02 ENCOUNTER — Other Ambulatory Visit: Payer: Self-pay

## 2019-03-02 ENCOUNTER — Encounter: Payer: Self-pay | Admitting: Physical Therapy

## 2019-03-02 ENCOUNTER — Ambulatory Visit: Payer: Medicare Other | Admitting: Physical Therapy

## 2019-03-02 DIAGNOSIS — M25551 Pain in right hip: Secondary | ICD-10-CM | POA: Diagnosis not present

## 2019-03-02 DIAGNOSIS — R262 Difficulty in walking, not elsewhere classified: Secondary | ICD-10-CM

## 2019-03-02 DIAGNOSIS — M545 Low back pain, unspecified: Secondary | ICD-10-CM

## 2019-03-02 NOTE — Therapy (Signed)
[place], [place], [address] Phone: [phone]   Fax:  [phone]  Physical Therapy Treatment  Patient Details  Name: Gloria Mckinney MRN: [id number] Date of Birth: 1945/12/18 Referring Provider (PT): Deveshwar   Encounter Date: 03/02/2019  PT End of Session - 03/02/19 1349    Visit Number  11    Date for PT Re-Evaluation  03/27/19    PT Start Time  1314    PT Stop Time  1403    PT Time Calculation (min)  49 min    Activity Tolerance  Patient tolerated treatment well    Behavior During Therapy  [facility] for tasks assessed/performed       Past Medical History:  Diagnosis Date  . GERD (gastroesophageal reflux disease)   . Hypertension   . Mixed hyperlipidemia   . OA (osteoarthritis)    knee right  . Peripheral neuropathy   . PVD (peripheral vascular disease) ([place])   . Type 2 diabetes mellitus treated with insulin [facility])    endocrinologist--- dr Providence Crosby [place]    Past Surgical History:  Procedure Laterality Date  . BREAST BIOPSY Bilateral 2000   benign  . BREAST LUMPECTOMY WITH RADIOACTIVE SEED AND SENTINEL LYMPH NODE BIOPSY Left 08/15/2018   Procedure: LEFT BREAST LUMPECTOMY WITH RADIOACTIVE SEED AND LEFT AXILLARY DEEP SENTINEL LYMPH NODE BIOPSY INJECT BLUE DYE LEFT BREAST;  Surgeon: Fanny Skates, MD;  Location: [place];  Service: General;  Laterality: Left;  . PORTACATH PLACEMENT Right 08/15/2018   Procedure: INSERTION PORT-A-CATH WITH ULTRASOUND;  Surgeon: Fanny Skates, MD;  Location: [place];  Service: General;  Laterality: Right;  . SHOULDER ARTHROSCOPY Right 06-16-2002   dr graves  . SHOULDER OPEN ROTATOR CUFF REPAIR Right 07-28-2000    dr Berenice Primas  . VAGINAL HYSTERECTOMY  1992   with BSO  . WRIST SURGERY  2007    There were no vitals filed for this visit.  Subjective Assessment - 03/02/19 1318    Subjective  Patient reports that she  has been standing and cooking and has some incresaed LBP now    Currently in Pain?  Yes    Pain Score  4     Pain Location  Back    Aggravating Factors   standing and cooking                       [facility] Adult PT Treatment/Exercise - 03/02/19 0001      Lumbar Exercises: Aerobic   Nustep  level 5x 6 minutes      Lumbar Exercises: Standing   Row  Theraband;20 reps;Both;Strengthening    Theraband Level (Row)  Level 2 (Red)    Shoulder Extension  Theraband;20 reps;Both;Strengthening    Theraband Level (Shoulder Extension)  Level 2 (Red)      Lumbar Exercises: Seated   Other Seated Lumbar Exercises  pelvic mobility on sit fit and then scapular stabilization with red tband      Lumbar Exercises: Supine   Other Supine Lumbar Exercises  feet on ball K2C, trunk rotation, small bridge and isometric abs      Modalities   Modalities  Electrical Stimulation;Moist Heat      Moist Heat Therapy   Number Minutes Moist Heat  10 Minutes    Moist Heat Location  Lumbar Spine      Electrical Stimulation   Electrical Stimulation Location  L/S area  Electrical Stimulation Action  IFC    Electrical Stimulation Parameters  left side lying    Electrical Stimulation Goals  Pain      Manual Therapy   Manual Therapy  Soft tissue mobilization    Soft tissue mobilization  to the right gluteal area, into the right ITB and bilateral lumbar parapsinals               PT Short Term Goals - 02/07/19 1655      PT SHORT TERM GOAL #1   Title  independent with HEP    Status  Achieved        PT Long Term Goals - 03/02/19 1352      PT LONG TERM GOAL #1   Title  understand posture and body mechanics    Status  Achieved            Plan - 03/02/19 1350    Clinical Impression Statement  Patient reports that she feels like she is getting better and stronger but is still getting pain with standing and cooking, also reports worse in the AM    PT Next Visit Plan  will work on  the lumbar area and see if this helps    Consulted and Agree with Plan of Care  Patient       Patient will benefit from skilled therapeutic intervention in order to improve the following deficits and impairments:  Abnormal gait, Improper body mechanics, Pain, Postural dysfunction, Increased muscle spasms, Decreased mobility, Decreased activity tolerance, Decreased endurance, Decreased range of motion, Decreased strength, Impaired flexibility, Difficulty walking  Visit Diagnosis: 1. Pain in right hip   2. Acute right-sided low back pain without sciatica   3. Difficulty in walking, not elsewhere classified        Problem List Patient Active Problem List   Diagnosis Date Noted  . Port-A-Cath in place 09/19/2018  . Malignant neoplasm of upper-inner quadrant of left breast in female, estrogen receptor positive ([place]) 07/27/2018  . Dyspnea on exertion 10/21/2016  . Type 2 diabetes mellitus without complication, without long-term current use of insulin ([place]) 10/21/2016  . Dyslipidemia 10/21/2016    Sumner Boast., PT 03/02/2019, 1:52 PM  [address] [place], [place], [address] Phone: [phone]   Fax:  [phone]  Name: Olimpia Tinch MRN: [id number] Date of Birth: Mar 30, 1946

## 2019-03-07 ENCOUNTER — Ambulatory Visit: Payer: Medicare Other | Admitting: Physical Therapy

## 2019-03-07 ENCOUNTER — Other Ambulatory Visit: Payer: Self-pay

## 2019-03-07 ENCOUNTER — Encounter: Payer: Self-pay | Admitting: Physical Therapy

## 2019-03-07 DIAGNOSIS — R262 Difficulty in walking, not elsewhere classified: Secondary | ICD-10-CM

## 2019-03-07 DIAGNOSIS — M545 Low back pain, unspecified: Secondary | ICD-10-CM

## 2019-03-07 DIAGNOSIS — M25551 Pain in right hip: Secondary | ICD-10-CM | POA: Diagnosis not present

## 2019-03-07 NOTE — Therapy (Signed)
[place], [place], [address] Phone: [phone]   Fax:  [phone]  Physical Therapy Treatment  Patient Details  Name: Gloria Mckinney MRN: [id number] Date of Birth: 07-19-1946 Referring Provider (PT): Deveshwar   Encounter Date: 03/07/2019  PT End of Session - 03/07/19 1525    Visit Number  12    Date for PT Re-Evaluation  03/27/19    PT Start Time  1445    PT Stop Time  1535    PT Time Calculation (min)  50 min    Activity Tolerance  Patient tolerated treatment well    Behavior During Therapy  [facility] for tasks assessed/performed       Past Medical History:  Diagnosis Date  . GERD (gastroesophageal reflux disease)   . Hypertension   . Mixed hyperlipidemia   . OA (osteoarthritis)    knee right  . Peripheral neuropathy   . PVD (peripheral vascular disease) ([place])   . Type 2 diabetes mellitus treated with insulin [facility])    endocrinologist--- dr Providence Crosby [place]    Past Surgical History:  Procedure Laterality Date  . BREAST BIOPSY Bilateral 2000   benign  . BREAST LUMPECTOMY WITH RADIOACTIVE SEED AND SENTINEL LYMPH NODE BIOPSY Left 08/15/2018   Procedure: LEFT BREAST LUMPECTOMY WITH RADIOACTIVE SEED AND LEFT AXILLARY DEEP SENTINEL LYMPH NODE BIOPSY INJECT BLUE DYE LEFT BREAST;  Surgeon: Fanny Skates, MD;  Location: [place];  Service: General;  Laterality: Left;  . PORTACATH PLACEMENT Right 08/15/2018   Procedure: INSERTION PORT-A-CATH WITH ULTRASOUND;  Surgeon: Fanny Skates, MD;  Location: [place];  Service: General;  Laterality: Right;  . SHOULDER ARTHROSCOPY Right 06-16-2002   dr graves  . SHOULDER OPEN ROTATOR CUFF REPAIR Right 07-28-2000    dr Berenice Primas  . VAGINAL HYSTERECTOMY  1992   with BSO  . WRIST SURGERY  2007    There were no vitals filed for this visit.  Subjective Assessment - 03/07/19 1453    Subjective  Reports been doing  better, mostly pain in the morning    Currently in Pain?  Yes    Pain Score  2     Pain Location  Back    Pain Orientation  Lower    Aggravating Factors   morning                       [facility] Adult PT Treatment/Exercise - 03/07/19 0001      Lumbar Exercises: Stretches   Passive Hamstring Stretch  4 reps;20 seconds    Lower Trunk Rotation  3 reps;10 seconds    ITB Stretch  3 reps;10 seconds    Piriformis Stretch  4 reps;20 seconds      Lumbar Exercises: Aerobic   Nustep  level 5x 6 minutes      Lumbar Exercises: Machines for Strengthening   Other Lumbar Machine Exercise  seated row, lats 15# 2x10      Lumbar Exercises: Standing   Row  Theraband;20 reps;Both;Strengthening    Theraband Level (Row)  Level 2 (Red)      Lumbar Exercises: Supine   Bridge with Ball Squeeze  20 reps;1 second    Other Supine Lumbar Exercises  feet on ball K2C, trunk rotation, small bridge and isometric abs      Modalities   Modalities  Electrical Stimulation;Moist Heat      Moist Heat Therapy   Number Minutes Moist  Heat  10 Minutes    Moist Heat Location  Lumbar Spine      Electrical Stimulation   Electrical Stimulation Location  L/S area     Electrical Stimulation Action  IFC    Electrical Stimulation Parameters  left side lying    Electrical Stimulation Goals  Pain      Manual Therapy   Manual Therapy  Soft tissue mobilization    Soft tissue mobilization  to the right gluteal area, into the right ITB and bilateral lumbar parapsinals               PT Short Term Goals - 02/07/19 1655      PT SHORT TERM GOAL #1   Title  independent with HEP    Status  Achieved        PT Long Term Goals - 03/02/19 1352      PT LONG TERM GOAL #1   Title  understand posture and body mechanics    Status  Achieved            Plan - 03/07/19 1525    Clinical Impression Statement  Still worse int he AM, she c/o pain in the hands and the knees, reports arthritis.  She is  doing better motions are more fluid and she get in and out of bed easier    PT Next Visit Plan  will work on the lumbar area and see if this helps    Consulted and Agree with Plan of Care  Patient       Patient will benefit from skilled therapeutic intervention in order to improve the following deficits and impairments:  Abnormal gait, Improper body mechanics, Pain, Postural dysfunction, Increased muscle spasms, Decreased mobility, Decreased activity tolerance, Decreased endurance, Decreased range of motion, Decreased strength, Impaired flexibility, Difficulty walking  Visit Diagnosis: 1. Pain in right hip   2. Acute right-sided low back pain without sciatica   3. Difficulty in walking, not elsewhere classified        Problem List Patient Active Problem List   Diagnosis Date Noted  . Port-A-Cath in place 09/19/2018  . Malignant neoplasm of upper-inner quadrant of left breast in female, estrogen receptor positive ([place]) 07/27/2018  . Dyspnea on exertion 10/21/2016  . Type 2 diabetes mellitus without complication, without long-term current use of insulin ([place]) 10/21/2016  . Dyslipidemia 10/21/2016    Sumner Boast., PT 03/07/2019, 3:26 PM  [place] [address] [place], [place], [address] Phone: [phone]   Fax:  [phone]  Name: Gloria Mckinney MRN: [id number] Date of Birth: [birth year]

## 2019-03-09 NOTE — Progress Notes (Signed)
  Patient Name: Gloria Mckinney MRN: [id number] DOB: September 15, 1945 Referring Physician: Nicholas Lose (Profile Not Attached) Date of Service: 02/13/2019 [facility], [place]                                                        End Of Treatment Note  Diagnoses: C50.212-Malignant neoplasm of upper-inner quadrant of left female breast  Cancer Staging Malignant neoplasm of upper-inner quadrant of left breast in female, estrogen receptor positive ([place]) Staging form: Breast, AJCC 8th Edition - Pathologic: Stage IA (pT1b, pN0, cM0, G3, ER+, PR+, HER2+) - Signed by Gardenia Phlegm, NP on 08/24/2018  Intent: Curative  Radiation Treatment Dates: 01/16/2019 through 02/13/2019 Site Technique Total Dose Dose per Fx Completed Fx Beam Energies  Breast: Breast_Lt 3D 40.05/40.05 2.67 15/15 6X, 10X  Breast: Breast_Lt_Bst specialPort 10/10 2 5/5 12E   Narrative: The patient tolerated radiation therapy relatively well. She experienced developed hyperpigmentation changes in the treatment field; skin intact. She is using the provided skin creams as ordered. She also noted mild fatigue as she progressed through treatment.  Plan: The patient will follow-up with radiation oncology in one month.  ________________________________________________  Eppie Gibson, MD  This document serves as a record of services personally performed by Eppie Gibson, MD. It was created on her behalf by Rae Lips, a trained medical scribe. The creation of this record is based on the scribe's personal observations and the provider's statements to them. This document has been checked and approved by the attending provider.

## 2019-03-13 ENCOUNTER — Ambulatory Visit: Payer: Federal, State, Local not specified - PPO

## 2019-03-13 ENCOUNTER — Other Ambulatory Visit: Payer: Self-pay

## 2019-03-13 ENCOUNTER — Inpatient Hospital Stay: Payer: Medicare Other | Attending: Hematology and Oncology

## 2019-03-13 VITALS — BP 146/75 | HR 80 | Temp 98.3°F | Resp 16 | Ht 62.0 in | Wt 168.0 lb

## 2019-03-13 DIAGNOSIS — Z5111 Encounter for antineoplastic chemotherapy: Secondary | ICD-10-CM | POA: Diagnosis not present

## 2019-03-13 DIAGNOSIS — C50212 Malignant neoplasm of upper-inner quadrant of left female breast: Secondary | ICD-10-CM | POA: Diagnosis not present

## 2019-03-13 DIAGNOSIS — Z170 Estrogen receptor positive status [ER+]: Secondary | ICD-10-CM | POA: Insufficient documentation

## 2019-03-13 DIAGNOSIS — Z79899 Other long term (current) drug therapy: Secondary | ICD-10-CM | POA: Insufficient documentation

## 2019-03-13 DIAGNOSIS — K5900 Constipation, unspecified: Secondary | ICD-10-CM | POA: Insufficient documentation

## 2019-03-13 DIAGNOSIS — D509 Iron deficiency anemia, unspecified: Secondary | ICD-10-CM | POA: Diagnosis not present

## 2019-03-13 DIAGNOSIS — Z794 Long term (current) use of insulin: Secondary | ICD-10-CM | POA: Diagnosis not present

## 2019-03-13 DIAGNOSIS — Z923 Personal history of irradiation: Secondary | ICD-10-CM | POA: Insufficient documentation

## 2019-03-13 DIAGNOSIS — Z7982 Long term (current) use of aspirin: Secondary | ICD-10-CM | POA: Diagnosis not present

## 2019-03-13 DIAGNOSIS — R5383 Other fatigue: Secondary | ICD-10-CM | POA: Insufficient documentation

## 2019-03-13 MED ORDER — SODIUM CHLORIDE 0.9% FLUSH
10.0000 mL | INTRAVENOUS | Status: DC | PRN
  Filled 2019-03-13: qty 10

## 2019-03-13 MED ORDER — DIPHENHYDRAMINE HCL 25 MG PO CAPS
50.0000 mg | ORAL_CAPSULE | Freq: Once | ORAL | Status: AC
  Administered 2019-03-13: 12:00:00 50 mg via ORAL

## 2019-03-13 MED ORDER — DIPHENHYDRAMINE HCL 25 MG PO CAPS
ORAL_CAPSULE | ORAL | Status: AC
  Filled 2019-03-13: qty 2

## 2019-03-13 MED ORDER — TRASTUZUMAB CHEMO 150 MG IV SOLR
450.0000 mg | Freq: Once | INTRAVENOUS | Status: AC
  Administered 2019-03-13: 13:00:00 450 mg via INTRAVENOUS
  Filled 2019-03-13: qty 21.43

## 2019-03-13 MED ORDER — HEPARIN SOD (PORK) LOCK FLUSH 100 UNIT/ML IV SOLN
500.0000 [IU] | Freq: Once | INTRAVENOUS | Status: DC | PRN
  Filled 2019-03-13: qty 5

## 2019-03-13 MED ORDER — ACETAMINOPHEN 325 MG PO TABS
ORAL_TABLET | ORAL | Status: AC
  Filled 2019-03-13: qty 2

## 2019-03-13 MED ORDER — ACETAMINOPHEN 325 MG PO TABS
650.0000 mg | ORAL_TABLET | Freq: Once | ORAL | Status: AC
  Administered 2019-03-13: 650 mg via ORAL

## 2019-03-13 MED ORDER — SODIUM CHLORIDE 0.9 % IV SOLN
Freq: Once | INTRAVENOUS | Status: AC
  Administered 2019-03-13: 12:00:00 via INTRAVENOUS
  Filled 2019-03-13: qty 250

## 2019-03-16 ENCOUNTER — Encounter: Payer: Self-pay | Admitting: Radiation Oncology

## 2019-03-19 NOTE — Progress Notes (Signed)
The following biosimilar Ogivri ([id number]-dkst) has been selected for use in this patient.  Kennith Center, Pharm.D., CPP 03/19/2019@10 :27 AM

## 2019-03-20 ENCOUNTER — Other Ambulatory Visit: Payer: Self-pay

## 2019-03-20 ENCOUNTER — Ambulatory Visit: Payer: Medicare Other | Attending: Rheumatology | Admitting: Physical Therapy

## 2019-03-20 ENCOUNTER — Encounter: Payer: Self-pay | Admitting: Physical Therapy

## 2019-03-20 DIAGNOSIS — M25551 Pain in right hip: Secondary | ICD-10-CM | POA: Diagnosis not present

## 2019-03-20 DIAGNOSIS — M545 Low back pain, unspecified: Secondary | ICD-10-CM

## 2019-03-20 DIAGNOSIS — R262 Difficulty in walking, not elsewhere classified: Secondary | ICD-10-CM | POA: Diagnosis present

## 2019-03-20 NOTE — Therapy (Signed)
[place], [place], [address] Phone: [phone]   Fax:  [phone]  Physical Therapy Treatment  Patient Details  Name: Gloria Mckinney MRN: [id number] Date of Birth: 1946/03/03 Referring Provider (PT): Deveshwar   Encounter Date: 03/20/2019  PT End of Session - 03/20/19 1440    Visit Number  13    Date for PT Re-Evaluation  03/27/19    PT Start Time  1321    PT Stop Time  1421    PT Time Calculation (min)  60 min    Activity Tolerance  Patient tolerated treatment well    Behavior During Therapy  [facility] for tasks assessed/performed       Past Medical History:  Diagnosis Date  . GERD (gastroesophageal reflux disease)   . History of radiation therapy 01/16/19- 02/13/19   left breast 15 fractions of 2.67 Gy for a total of 40.05 Gy. Left breast boost 5 fractions of 2 Gy for a total of 10 Gy  . Hypertension   . Mixed hyperlipidemia   . OA (osteoarthritis)    knee right  . Peripheral neuropathy   . PVD (peripheral vascular disease) ([place])   . Type 2 diabetes mellitus treated with insulin [facility])    endocrinologist--- dr Providence Crosby [place]    Past Surgical History:  Procedure Laterality Date  . BREAST BIOPSY Bilateral 2000   benign  . BREAST LUMPECTOMY WITH RADIOACTIVE SEED AND SENTINEL LYMPH NODE BIOPSY Left 08/15/2018   Procedure: LEFT BREAST LUMPECTOMY WITH RADIOACTIVE SEED AND LEFT AXILLARY DEEP SENTINEL LYMPH NODE BIOPSY INJECT BLUE DYE LEFT BREAST;  Surgeon: Fanny Skates, MD;  Location: [place];  Service: General;  Laterality: Left;  . PORTACATH PLACEMENT Right 08/15/2018   Procedure: INSERTION PORT-A-CATH WITH ULTRASOUND;  Surgeon: Fanny Skates, MD;  Location: [place];  Service: General;  Laterality: Right;  . SHOULDER ARTHROSCOPY Right 06-16-2002   dr graves  . SHOULDER OPEN ROTATOR CUFF REPAIR Right 07-28-2000    dr Berenice Primas  . VAGINAL HYSTERECTOMY   1992   with BSO  . WRIST SURGERY  2007    There were no vitals filed for this visit.  Subjective Assessment - 03/20/19 1327    Subjective  Patient reports that she is doing better with standing, reports still pain with some standing and pain at times in the morning    Currently in Pain?  Yes    Pain Score  2     Pain Location  Back    Pain Orientation  Lower    Aggravating Factors   morning                       [facility] Adult PT Treatment/Exercise - 03/20/19 0001      Lumbar Exercises: Stretches   Passive Hamstring Stretch  4 reps;20 seconds    Lower Trunk Rotation  3 reps;10 seconds    ITB Stretch  3 reps;20 seconds;Right;Left    Piriformis Stretch  4 reps;20 seconds      Lumbar Exercises: Aerobic   Nustep  level 5x 6 minutes      Lumbar Exercises: Machines for Strengthening   Cybex Knee Extension  5lb 2x10     Cybex Knee Flexion  20# 2x10    Other Lumbar Machine Exercise  seated row, lats 15# 2x10      Lumbar Exercises: Seated   Other Seated Lumbar Exercises  pelvic mobility on  sit fit and then scapular stabilization with red tband      Lumbar Exercises: Supine   Other Supine Lumbar Exercises  feet on ball K2C, trunk rotation, small bridge and isometric abs      Modalities   Modalities  Electrical Stimulation;Moist Heat      Moist Heat Therapy   Number Minutes Moist Heat  10 Minutes    Moist Heat Location  Lumbar Spine      Electrical Stimulation   Electrical Stimulation Location  L/S area     Electrical Stimulation Action  IFC    Electrical Stimulation Parameters  left side lying    Electrical Stimulation Goals  Pain      Manual Therapy   Manual Therapy  Soft tissue mobilization    Soft tissue mobilization  bilateral Lumbar pareapsinals               PT Short Term Goals - 02/07/19 1655      PT SHORT TERM GOAL #1   Title  independent with HEP    Status  Achieved        PT Long Term Goals - 03/20/19 1455      PT LONG TERM GOAL  #1   Title  understand posture and body mechanics    Status  Achieved      PT LONG TERM GOAL #2   Title  increase lumbar ROM 25%    Status  Achieved      PT LONG TERM GOAL #3   Title  report sleeping better by 50%    Status  On-going            Plan - 03/20/19 1440    Clinical Impression Statement  Reports that overall she thinks she is doing better, she is still haivng pain in the low back in the morning and with standing to cook.  Her HS flexibility is great, the piriformis is coming a long, still tight in the ITB    PT Treatment/Interventions  ADLs/Self Care Home Management;Iontophoresis 4mg /ml Dexamethasone;Moist Heat;Ultrasound;Cryotherapy;Therapeutic activities;Therapeutic exercise;Balance training;Neuromuscular re-education;Manual techniques;Dry needling;Patient/family education    PT Next Visit Plan  See how this next few viists go and add as needed    Consulted and Agree with Plan of Care  Patient       Patient will benefit from skilled therapeutic intervention in order to improve the following deficits and impairments:  Abnormal gait, Improper body mechanics, Pain, Postural dysfunction, Increased muscle spasms, Decreased mobility, Decreased activity tolerance, Decreased endurance, Decreased range of motion, Decreased strength, Impaired flexibility, Difficulty walking  Visit Diagnosis: 1. Pain in right hip   2. Acute right-sided low back pain without sciatica   3. Difficulty in walking, not elsewhere classified        Problem List Patient Active Problem List   Diagnosis Date Noted  . Port-A-Cath in place 09/19/2018  . Malignant neoplasm of upper-inner quadrant of left breast in female, estrogen receptor positive ([place]) 07/27/2018  . Dyspnea on exertion 10/21/2016  . Type 2 diabetes mellitus without complication, without long-term current use of insulin ([place]) 10/21/2016  . Dyslipidemia 10/21/2016    Sumner Boast., PT 03/20/2019, 2:57 PM  [place] [address] [place], [place], [address] Phone: [phone]   Fax:  [phone]  Name: Gloria Mckinney MRN: [id number] Date of Birth: Nov 09, 1945

## 2019-03-24 ENCOUNTER — Ambulatory Visit
Admission: RE | Admit: 2019-03-24 | Discharge: 2019-03-24 | Disposition: A | Discharge status: Home / Self Care | Payer: Federal, State, Local not specified - PPO | Source: Ambulatory Visit | Attending: Radiation Oncology | Admitting: Radiation Oncology

## 2019-03-24 ENCOUNTER — Ambulatory Visit: Payer: Self-pay | Admitting: Radiation Oncology

## 2019-03-24 HISTORY — DX: Personal history of irradiation: Z92.3

## 2019-03-24 NOTE — Progress Notes (Signed)
I called the patient today about her upcoming follow-up appointment in radiation oncology.   Given concerns about the COVID-19 pandemic, I offered a phone assessment with the patient to determine if coming to the clinic was necessary. She accepted.  I let the patient know that I had spoken with Dr. Isidore Moos, and she wanted them to know the importance of washing their hands for at least 20 seconds at a time, especially after going out in public, and before they eat.  Limit going out in public whenever possible. Do not touch your face, unless your hands are clean, such as when bathing. Get plenty of rest, eat well, and stay hydrated.   The patient denies any symptomatic concerns.  Specifically, they report good healing of their skin in the radiation fields.  Skin is intact.    I recommended that she continue skin care by applying oil or lotion with vitamin E to the skin in the radiation fields, BID, for 2 more months.  Continue follow-up with medical oncology - follow-up is scheduled on 04/03/19 with Dr. Lindi Adie.  I explained that yearly mammograms are important for patients with intact breast tissue, and physical exams are important after mastectomy for patients that cannot undergo mammography.  I encouraged her to call if she had further questions or concerns about her healing. Otherwise, she will follow-up PRN in radiation oncology. Patient is pleased with this plan, and we will cancel her upcoming follow-up to reduce the risk of COVID-19 transmission.

## 2019-03-27 ENCOUNTER — Other Ambulatory Visit: Payer: Self-pay

## 2019-03-27 ENCOUNTER — Encounter: Payer: Self-pay | Admitting: Physical Therapy

## 2019-03-27 ENCOUNTER — Ambulatory Visit: Payer: Medicare Other | Admitting: Physical Therapy

## 2019-03-27 DIAGNOSIS — M545 Low back pain, unspecified: Secondary | ICD-10-CM

## 2019-03-27 DIAGNOSIS — M25551 Pain in right hip: Secondary | ICD-10-CM

## 2019-03-27 DIAGNOSIS — R262 Difficulty in walking, not elsewhere classified: Secondary | ICD-10-CM

## 2019-03-27 NOTE — Assessment & Plan Note (Signed)
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  Plan:  1.Adjuvant Taxol Herceptin weekly x12completed 4/20/2020followed by Herceptin maintenance for 1 year to be completed January 2020 2.Adjuvant radiation therapy completed 02/13/2019 3.Followed by adjuvant antiestrogen therapy --------------------------------------------------------------------------------------------------------------------------------------------------------- Current treatment: Adjuvant Herceptin Adjuvant radiation completed 02/13/2019 Echocardiogram 11/29/2018: EF 55 to 60%  Plan is to continue Herceptin every3weeks and follow-up with me every 6 weeks.

## 2019-03-27 NOTE — Therapy (Signed)
[place], [place], [address] Phone: [phone]   Fax:  [phone]  Physical Therapy Treatment  Patient Details  Name: Gloria Mckinney MRN: [id number] Date of Birth: 02/23/1946 Referring Provider (PT): Deveshwar   Encounter Date: 03/27/2019  PT End of Session - 03/27/19 1358    Visit Number  14    Date for PT Re-Evaluation  04/29/19    PT Start Time  1316    PT Stop Time  1411    PT Time Calculation (min)  55 min    Activity Tolerance  Patient tolerated treatment well    Behavior During Therapy  [facility] for tasks assessed/performed       Past Medical History:  Diagnosis Date  . GERD (gastroesophageal reflux disease)   . History of radiation therapy 01/16/19- 02/13/19   left breast 15 fractions of 2.67 Gy for a total of 40.05 Gy. Left breast boost 5 fractions of 2 Gy for a total of 10 Gy  . Hypertension   . Mixed hyperlipidemia   . OA (osteoarthritis)    knee right  . Peripheral neuropathy   . PVD (peripheral vascular disease) ([place])   . Type 2 diabetes mellitus treated with insulin [facility])    endocrinologist--- dr Providence Crosby [place]    Past Surgical History:  Procedure Laterality Date  . BREAST BIOPSY Bilateral 2000   benign  . BREAST LUMPECTOMY WITH RADIOACTIVE SEED AND SENTINEL LYMPH NODE BIOPSY Left 08/15/2018   Procedure: LEFT BREAST LUMPECTOMY WITH RADIOACTIVE SEED AND LEFT AXILLARY DEEP SENTINEL LYMPH NODE BIOPSY INJECT BLUE DYE LEFT BREAST;  Surgeon: Fanny Skates, MD;  Location: [place];  Service: General;  Laterality: Left;  . PORTACATH PLACEMENT Right 08/15/2018   Procedure: INSERTION PORT-A-CATH WITH ULTRASOUND;  Surgeon: Fanny Skates, MD;  Location: [place];  Service: General;  Laterality: Right;  . SHOULDER ARTHROSCOPY Right 06-16-2002   dr graves  . SHOULDER OPEN ROTATOR CUFF REPAIR Right 07-28-2000    dr Berenice Primas  . VAGINAL HYSTERECTOMY   1992   with BSO  . WRIST SURGERY  2007    There were no vitals filed for this visit.  Subjective Assessment - 03/27/19 1319    Subjective  Less pain last week, but seems to be coming back    Currently in Pain?  Yes    Pain Score  3     Pain Location  Back    Aggravating Factors   morning and standing    Pain Relieving Factors  treatment helping                       [facility] Adult PT Treatment/Exercise - 03/27/19 0001      Lumbar Exercises: Stretches   Passive Hamstring Stretch  4 reps;20 seconds    Lower Trunk Rotation  3 reps;10 seconds    ITB Stretch  3 reps;20 seconds;Right;Left    Piriformis Stretch  4 reps;20 seconds      Lumbar Exercises: Aerobic   Nustep  level 5x 6 minutes      Lumbar Exercises: Machines for Strengthening   Other Lumbar Machine Exercise  seated row, lats 20# 2x10      Lumbar Exercises: Seated   Other Seated Lumbar Exercises  pelvic mobility on sit fit and then scapular stabilization with red tband      Lumbar Exercises: Supine   Other Supine Lumbar Exercises  feet on ball  K2C, trunk rotation, small bridge and isometric abs      Modalities   Modalities  Electrical Stimulation;Moist Heat      Moist Heat Therapy   Number Minutes Moist Heat  10 Minutes    Moist Heat Location  Lumbar Spine      Electrical Stimulation   Electrical Stimulation Location  L/S area     Electrical Stimulation Action  IFC    Electrical Stimulation Parameters  left side lying    Electrical Stimulation Goals  Pain      Manual Therapy   Manual Therapy  Soft tissue mobilization    Soft tissue mobilization  bilateral Lumbar pareapsinals               PT Short Term Goals - 02/07/19 1655      PT SHORT TERM GOAL #1   Title  independent with HEP    Status  Achieved        PT Long Term Goals - 03/27/19 1444      PT LONG TERM GOAL #1   Title  understand posture and body mechanics    Status  Achieved      PT LONG TERM GOAL #2   Title   increase lumbar ROM 25%    Status  Achieved      PT LONG TERM GOAL #3   Title  report sleeping better by 50%    Status  On-going      PT LONG TERM GOAL #4   Title  cook a meal without pain    Status  Partially Met            Plan - 03/27/19 1401    Clinical Impression Statement  Patient reports worst pain when bending, she is very tright in the mms of the low back, she denies a lot of tenderness except has some very tender knots inthe buttocks.  Overall she reports that she feels stronger and is able to do more with less pain.    PT Next Visit Plan  add exercises as tolerated    Consulted and Agree with Plan of Care  Patient       Patient will benefit from skilled therapeutic intervention in order to improve the following deficits and impairments:  Abnormal gait, Improper body mechanics, Pain, Postural dysfunction, Increased muscle spasms, Decreased mobility, Decreased activity tolerance, Decreased endurance, Decreased range of motion, Decreased strength, Impaired flexibility, Difficulty walking  Visit Diagnosis: 1. Pain in right hip   2. Acute right-sided low back pain without sciatica   3. Difficulty in walking, not elsewhere classified        Problem List Patient Active Problem List   Diagnosis Date Noted  . Port-A-Cath in place 09/19/2018  . Malignant neoplasm of upper-inner quadrant of left breast in female, estrogen receptor positive ([place]) 07/27/2018  . Dyspnea on exertion 10/21/2016  . Type 2 diabetes mellitus without complication, without long-term current use of insulin ([place]) 10/21/2016  . Dyslipidemia 10/21/2016    Sumner Boast., PT 03/27/2019, 2:47 PM  [place] [address] [address] [place], [place], [address] Phone: [phone]   Fax:  [phone]  Name: Gloria Mckinney MRN: [id number] Date of Birth: 1946/04/17

## 2019-03-30 ENCOUNTER — Ambulatory Visit: Payer: Medicare Other | Admitting: Physical Therapy

## 2019-03-30 ENCOUNTER — Other Ambulatory Visit: Payer: Self-pay

## 2019-03-30 ENCOUNTER — Encounter: Payer: Self-pay | Admitting: Physical Therapy

## 2019-03-30 DIAGNOSIS — M25551 Pain in right hip: Secondary | ICD-10-CM | POA: Diagnosis not present

## 2019-03-30 DIAGNOSIS — R262 Difficulty in walking, not elsewhere classified: Secondary | ICD-10-CM

## 2019-03-30 DIAGNOSIS — M545 Low back pain, unspecified: Secondary | ICD-10-CM

## 2019-03-30 NOTE — Therapy (Signed)
[place] [address] [place], [place], [address] Phone: [phone]   Fax:  [phone]  Physical Therapy Treatment  Patient Details  Name: Gloria Mckinney MRN: [id number] Date of Birth: Jun 15, 1946 Referring Provider (PT): Deveshwar   Encounter Date: 03/30/2019  PT End of Session - 03/30/19 1400    Visit Number  15    Date for PT Re-Evaluation  04/29/19    PT Start Time  9163    PT Stop Time  1401    PT Time Calculation (min)  48 min    Activity Tolerance  Patient tolerated treatment well    Behavior During Therapy  [facility] for tasks assessed/performed       Past Medical History:  Diagnosis Date  . GERD (gastroesophageal reflux disease)   . History of radiation therapy 01/16/19- 02/13/19   left breast 15 fractions of 2.67 Gy for a total of 40.05 Gy. Left breast boost 5 fractions of 2 Gy for a total of 10 Gy  . Hypertension   . Mixed hyperlipidemia   . OA (osteoarthritis)    knee right  . Peripheral neuropathy   . PVD (peripheral vascular disease) ([place])   . Type 2 diabetes mellitus treated with insulin [facility])    endocrinologist--- dr Providence Crosby Graff    Past Surgical History:  Procedure Laterality Date  . BREAST BIOPSY Bilateral 2000   benign  . BREAST LUMPECTOMY WITH RADIOACTIVE SEED AND SENTINEL LYMPH NODE BIOPSY Left 08/15/2018   Procedure: LEFT BREAST LUMPECTOMY WITH RADIOACTIVE SEED AND LEFT AXILLARY DEEP SENTINEL LYMPH NODE BIOPSY INJECT BLUE DYE LEFT BREAST;  Surgeon: Fanny Skates, MD;  Location: [place];  Service: General;  Laterality: Left;  . PORTACATH PLACEMENT Right 08/15/2018   Procedure: INSERTION PORT-A-CATH WITH ULTRASOUND;  Surgeon: Fanny Skates, MD;  Location: [facility];  Service: General;  Laterality: Right;  . SHOULDER ARTHROSCOPY Right 06-16-2002   dr graves  . SHOULDER OPEN ROTATOR CUFF REPAIR Right 07-28-2000    dr Berenice Primas  . VAGINAL HYSTERECTOMY   1992   with BSO  . WRIST SURGERY  2007    There were no vitals filed for this visit.  Subjective Assessment - 03/30/19 1317    Subjective  Patient reports that she has been very busy lately    Currently in Pain?  Yes    Pain Score  3     Pain Location  Back    Pain Orientation  Lower    Aggravating Factors   standing                       [facility] Adult PT Treatment/Exercise - 03/30/19 0001      Ambulation/Gait   Gait Comments  around the building 1 lap, she did start limping on the left up the hill and was short of breath after this lap, she did go a t fair pace      Lumbar Exercises: Aerobic   Nustep  level 5x 6 minutes      Lumbar Exercises: Seated   Other Seated Lumbar Exercises  pelvic mobility on sit fit and then scapular stabilization with red tband, on sit fit 2.5 # marches and LAQ's, with weighted ball side to side      Lumbar Exercises: Supine   Bridge with Ball Squeeze  20 reps;1 second      Manual Therapy   Manual Therapy  Soft tissue mobilization  Soft tissue mobilization  bilateral Lumbar pareapsinals               PT Short Term Goals - 02/07/19 1655      PT SHORT TERM GOAL #1   Title  independent with HEP    Status  Achieved        PT Long Term Goals - 03/30/19 1401      PT LONG TERM GOAL #1   Title  understand posture and body mechanics    Status  Achieved      PT LONG TERM GOAL #2   Title  increase lumbar ROM 25%    Status  Achieved      PT LONG TERM GOAL #3   Title  report sleeping better by 50%    Status  Partially Met            Plan - 03/30/19 1400    Clinical Impression Statement  The walk around the building seemed to really fatigue her, she did start limping toward the end.  Tolerance to standing is improving per her report    PT Next Visit Plan  add exercises as tolerated    Consulted and Agree with Plan of Care  Patient       Patient will benefit from skilled therapeutic intervention in order to  improve the following deficits and impairments:  Abnormal gait, Improper body mechanics, Pain, Postural dysfunction, Increased muscle spasms, Decreased mobility, Decreased activity tolerance, Decreased endurance, Decreased range of motion, Decreased strength, Impaired flexibility, Difficulty walking  Visit Diagnosis: Pain in right hip  Acute right-sided low back pain without sciatica  Difficulty in walking, not elsewhere classified     Problem List Patient Active Problem List   Diagnosis Date Noted  . Port-A-Cath in place 09/19/2018  . Malignant neoplasm of upper-inner quadrant of left breast in female, estrogen receptor positive ([place]) 07/27/2018  . Dyspnea on exertion 10/21/2016  . Type 2 diabetes mellitus without complication, without long-term current use of insulin ([place]) 10/21/2016  . Dyslipidemia 10/21/2016    Sumner Boast., PT 03/30/2019, 2:39 PM  [place] [address] [place], [place], [address] Phone: [phone]   Fax:  [phone]  Name: Harlee Eckroth MRN: [id number] Date of Birth: [birth year]

## 2019-04-02 NOTE — Progress Notes (Signed)
Patient Care Team: [name], MD as PCP - General (Family Medicine) Nicholas Lose, MD as Consulting Physician (Hematology and Oncology) Delice Bison, Charlestine Massed, NP as Nurse Practitioner (Hematology and Oncology) Fanny Skates, MD as Consulting Physician (General Surgery)  DIAGNOSIS:    ICD-10-CM   1. Malignant neoplasm of upper-inner quadrant of left breast in female, estrogen receptor positive ([place])  C50.212    Z17.0     SUMMARY OF ONCOLOGIC HISTORY: Oncology History  Malignant neoplasm of upper-inner quadrant of left breast in female, estrogen receptor positive ([place])  07/18/2018 Initial Diagnosis   Screening detected left breast mass at 10 o'clock position 1.8 cm axilla negative, biopsy revealed grade 3 IDC ER 80%, PR 70%, Ki-67 40%, HER-2 +3+ by IHC with heterogeneity, T1c N0 stage Ia clinical stage   08/15/2018 Surgery   Left Lumpectomy: IDC grade 3, 1.9 cm, Posterior margin positive (Muscle) due to a transected satellite nodule 0.5 cm, 0/3 LN Neg, ER 80%, PR 70%, Ki-67 40%, HER-2 +3+ by IHC with heterogeneity T1cN0 Stage 1A   08/24/2018 Cancer Staging   Staging form: Breast, AJCC 8th Edition - Pathologic: Stage IA (pT1b, pN0, cM0, G3, ER+, PR+, HER2+) - Signed by Gardenia Phlegm, NP on 08/24/2018   09/09/2018 -  Chemotherapy   The patient had trastuzumab (HERCEPTIN) 300 mg in sodium chloride 0.9 % 250 mL chemo infusion, 294 mg, Intravenous,  Once, 8 of 8 cycles Administration: 300 mg (09/09/2018), 150 mg (09/19/2018), 150 mg (10/10/2018), 450 mg (12/19/2018), 450 mg (01/09/2019), 450 mg (03/13/2019), 150 mg (09/26/2018), 150 mg (10/03/2018), 150 mg (10/17/2018), 150 mg (10/24/2018), 150 mg (10/31/2018), 150 mg (11/07/2018), 150 mg (11/14/2018), 150 mg (11/21/2018), 450 mg (11/28/2018), 450 mg (01/30/2019), 450 mg (02/20/2019) PACLitaxel (TAXOL) 150 mg in sodium chloride 0.9 % 250 mL chemo infusion (</= 61m/m2), 80 mg/m2 = 150 mg, Intravenous,  Once, 3 of 3 cycles Dose modification: 60  mg/m2 (original dose 80 mg/m2, Cycle 2, Reason: Dose not tolerated) Administration: 150 mg (09/09/2018), 150 mg (09/19/2018), 150 mg (10/10/2018), 150 mg (09/26/2018), 150 mg (10/03/2018), 114 mg (10/17/2018), 114 mg (10/24/2018), 114 mg (10/31/2018), 114 mg (11/07/2018), 114 mg (11/14/2018), 114 mg (11/21/2018), 114 mg (11/28/2018) trastuzumab-dkst (OGIVRI) 450 mg in sodium chloride 0.9 % 250 mL chemo infusion, 450 mg (100 % of original dose 450 mg), Intravenous,  Once, 0 of 7 cycles Dose modification: 450 mg (original dose 450 mg, Cycle 9, Reason: Other (see comments), Comment: Biosimilar Conversion)  for chemotherapy treatment.    01/17/2019 - 02/13/2019 Radiation Therapy   Adjuvant XRT     CHIEF COMPLIANT: Follow-up of Herceptin maintenance  INTERVAL HISTORY: Gloria Mckinney a 73y.o. with above-mentioned history of left breast cancer treated with lumpectomy,adjuvant chemotherapy, radiation, and who is currently on Herceptin maintenance.Echo on 02/27/19 showed an ejection fraction in the range of 55-60%. She presents to the clinic todayfor treatment. Other than mild fatigue she is tolerating everything well.  She also has arthritis.  REVIEW OF SYSTEMS:   Constitutional: Denies fevers, chills or abnormal weight loss Eyes: Denies blurriness of vision Ears, nose, mouth, throat, and face: Denies mucositis or sore throat Respiratory: Denies cough, dyspnea or wheezes Cardiovascular: Denies palpitation, chest discomfort Gastrointestinal: Denies nausea, heartburn or change in bowel habits Skin: Denies abnormal skin rashes Lymphatics: Denies new lymphadenopathy or easy bruising Neurological: Denies numbness, tingling or new weaknesses Behavioral/Psych: Mood is stable, no new changes  Extremities: No lower extremity edema, arthritis in her extremities Breast: denies any pain or  lumps or nodules in either breasts All other systems were reviewed with the patient and are negative.  I have  reviewed the past medical history, past surgical history, social history and family history with the patient and they are unchanged from previous note.  ALLERGIES:  is allergic to penicillins; cephalexin; and statins.  MEDICATIONS:  Current Outpatient Medications  Medication Sig Dispense Refill  . aspirin EC 81 MG tablet Take 81 mg by mouth daily.    . Calcium Carb-Cholecalciferol (CALCIUM + VITAMIN D3 PO) Take 1 tablet by mouth daily.    . Empagliflozin-metFORMIN HCl (SYNJARDY) 12.12-998 MG TABS Take 1 tablet by mouth 2 (two) times daily.    Marland Kitchen EXFORGE 5-320 MG tablet Take 1 tablet by mouth daily.    . ferrous sulfate 325 (65 FE) MG tablet Take 325 mg by mouth daily with breakfast.    . hydrochlorothiazide (HYDRODIURIL) 25 MG tablet Take 25 mg by mouth daily.    Marland Kitchen HYDROcodone-acetaminophen (NORCO) 5-325 MG tablet Take 1-2 tablets by mouth every 6 (six) hours as needed for moderate pain or severe pain. (Patient not taking: Reported on 12/28/2018) 20 tablet 0  . Insulin Glargine, 1 Unit Dial, (TOUJEO SOLOSTAR) 300 UNIT/ML SOPN Inject 50 Units into the skin every evening.    . lidocaine-prilocaine (EMLA) cream Apply to affected area once 30 g 3  . magic mouthwash w/lidocaine SOLN Take 5 mLs by mouth 3 (three) times daily as needed for mouth pain. Recipe: 1 part lidocaine, 1 part Maalox, 1 part Diphenhydramine. (Patient not taking: Reported on 01/05/2019) 100 mL 0  . meloxicam (MOBIC) 15 MG tablet     . mirtazapine (REMERON) 7.5 MG tablet Take 1 tablet (7.5 mg total) by mouth at bedtime. (Patient not taking: Reported on 01/05/2019) 30 tablet 0  . omeprazole (PRILOSEC) 40 MG capsule Take 40 mg by mouth every morning.     . ondansetron (ZOFRAN) 8 MG tablet Take 1 tablet (8 mg total) by mouth 2 (two) times daily as needed (Nausea or vomiting). (Patient not taking: Reported on 12/28/2018) 30 tablet 1  . prochlorperazine (COMPAZINE) 10 MG tablet Take 1 tablet (10 mg total) by mouth every 6 (six) hours as  needed (Nausea or vomiting). (Patient not taking: Reported on 12/28/2018) 30 tablet 1  . simvastatin (ZOCOR) 40 MG tablet Take 40 mg by mouth daily.    . traMADol (ULTRAM) 50 MG tablet as needed.      No current facility-administered medications for this visit.    Facility-Administered Medications Ordered in Other Visits  Medication Dose Route Frequency Provider Last Rate Last Dose  . sodium chloride flush (NS) 0.9 % injection 10 mL  10 mL Intravenous PRN Nicholas Lose, MD   10 mL at 09/09/18 1114    PHYSICAL EXAMINATION: ECOG PERFORMANCE STATUS: 1 - Symptomatic but completely ambulatory  Vitals:   04/03/19 1109  BP: (!) 160/73  Pulse: 86  Resp: 18  Temp: 98.1 F (36.7 C)  SpO2: 100%   Filed Weights   04/03/19 1109  Weight: 170 lb 14.4 oz (77.5 kg)    GENERAL: alert, no distress and comfortable SKIN: skin color, texture, turgor are normal, no rashes or significant lesions EYES: normal, Conjunctiva are pink and non-injected, sclera clear OROPHARYNX: no exudate, no erythema and lips, buccal mucosa, and tongue normal  NECK: supple, thyroid normal size, non-tender, without nodularity LYMPH: no palpable lymphadenopathy in the cervical, axillary or inguinal LUNGS: clear to auscultation and percussion with normal breathing effort HEART: regular  rate & rhythm and no murmurs and no lower extremity edema ABDOMEN: abdomen soft, non-tender and normal bowel sounds MUSCULOSKELETAL: no cyanosis of digits and no clubbing  NEURO: alert & oriented x 3 with fluent speech, no focal motor/sensory deficits EXTREMITIES: No lower extremity edema  LABORATORY DATA:  I have reviewed the data as listed CMP Latest Ref Rng & Units 02/20/2019 01/09/2019 11/28/2018  Glucose 70 - 99 mg/dL 196(H) 170(H) 164(H)  BUN 8 - 23 mg/dL _0 Creatinine 0.44 - 1.00 mg/dL 0.90 0.90 0.91  Sodium 135 - 145 mmol/L 136 139 135  Potassium 3.5 - 5.1 mmol/L 4.6 4.2 4.1  Chloride 98 - 111 mmol/L 105 104 102  CO2 22 -  32 mmol/L 21(L) 24 22  Calcium 8.9 - 10.3 mg/dL 9.8 10.1 9.7  Total Protein 6.5 - 8.1 g/dL 7.3 7.1 6.9  Total Bilirubin 0.3 - 1.2 mg/dL 0.2(L) 0.4 0.3  Alkaline Phos 38 - 126 U/L 104 89 72  AST 15 - 41 U/L 14(L) 15 11(L)  ALT 0 - 44 U/L _1 Lab Results  Component Value Date   WBC 5.7 04/03/2019   HGB 10.0 (L) 04/03/2019   HCT 32.7 (L) 04/03/2019   MCV 77.1 (L) 04/03/2019   PLT 254 04/03/2019   NEUTROABS 3.3 04/03/2019    ASSESSMENT & PLAN:  Malignant neoplasm of upper-inner quadrant of left breast in female, estrogen receptor positive ([facility]) 08/15/18:Left Lumpectomy: IDC grade 3, 1.9 cm, Posterior margin positive (Muscle) due to a transected satellite nodule 0.5 cm, 0/3 LN Neg, ER 80%, PR 70%, Ki-67 40%, HER-2 +3+ by IHC with heterogeneity T1cN0 Stage 1A  Posterior Margin: based on discussions with surgery, it is muscle and hence no further surgery is possible.  Plan:  1.Adjuvant Taxol Herceptin weekly x12completed 4/20/2020followed by Herceptin maintenance for 1 year to be completed January 2020 2.Adjuvant radiation therapy completed 02/13/2019 3.Followed by adjuvant antiestrogen therapy --------------------------------------------------------------------------------------------------------------------------------------------------------- Current treatment: Adjuvant Herceptin to be completed December 2020 Adjuvant radiation completed 02/13/2019 Echocardiogram 11/29/2018: EF 55 to 60%  Microcytic anemia: I ordered iron studies today.  I instructed her to start oral iron if she is low. For constipation she will take precautions.  Plan is to continue Herceptin every3weeks and follow-up with me every 6 weeks.  No orders of the defined types were placed in this encounter.  The patient has a good understanding of the overall plan. she agrees with it. she will call with any problems that may develop before the next visit here.  Nicholas Lose, MD 04/03/2019  Julious Oka Dorshimer am acting as scribe for Dr. Nicholas Lose.  I have reviewed the above documentation for accuracy and completeness, and I agree with the above.

## 2019-04-03 ENCOUNTER — Inpatient Hospital Stay: Payer: Medicare Other

## 2019-04-03 ENCOUNTER — Ambulatory Visit: Payer: Federal, State, Local not specified - PPO

## 2019-04-03 ENCOUNTER — Inpatient Hospital Stay (HOSPITAL_BASED_OUTPATIENT_CLINIC_OR_DEPARTMENT_OTHER): Payer: Medicare Other | Admitting: Hematology and Oncology

## 2019-04-03 ENCOUNTER — Other Ambulatory Visit: Payer: Self-pay

## 2019-04-03 ENCOUNTER — Other Ambulatory Visit: Payer: Federal, State, Local not specified - PPO

## 2019-04-03 ENCOUNTER — Encounter: Payer: Self-pay | Admitting: *Deleted

## 2019-04-03 ENCOUNTER — Other Ambulatory Visit: Payer: Self-pay | Admitting: Hematology and Oncology

## 2019-04-03 ENCOUNTER — Encounter: Payer: Federal, State, Local not specified - PPO | Admitting: Physical Therapy

## 2019-04-03 ENCOUNTER — Ambulatory Visit: Payer: Federal, State, Local not specified - PPO | Admitting: Hematology and Oncology

## 2019-04-03 VITALS — BP 160/73 | HR 86 | Temp 98.1°F | Resp 18 | Ht 62.0 in | Wt 170.9 lb

## 2019-04-03 DIAGNOSIS — C50212 Malignant neoplasm of upper-inner quadrant of left female breast: Secondary | ICD-10-CM

## 2019-04-03 DIAGNOSIS — Z170 Estrogen receptor positive status [ER+]: Secondary | ICD-10-CM

## 2019-04-03 DIAGNOSIS — D509 Iron deficiency anemia, unspecified: Secondary | ICD-10-CM

## 2019-04-03 DIAGNOSIS — Z95828 Presence of other vascular implants and grafts: Secondary | ICD-10-CM

## 2019-04-03 LAB — CMP (CANCER CENTER ONLY)
ALT: 18 U/L (ref 0–44)
AST: 16 U/L (ref 15–41)
Albumin: 3.7 g/dL (ref 3.5–5.0)
Alkaline Phosphatase: 97 U/L (ref 38–126)
Anion gap: 11 (ref 5–15)
BUN: 12 mg/dL (ref 8–23)
CO2: 22 mmol/L (ref 22–32)
Calcium: 9.4 mg/dL (ref 8.9–10.3)
Chloride: 102 mmol/L (ref 98–111)
Creatinine: 0.95 mg/dL (ref 0.44–1.00)
GFR, Est AFR Am: >60 mL/min (ref >60)
GFR, Estimated: >60 mL/min (ref >60)
Glucose, Bld: 187 mg/dL — ABNORMAL HIGH (ref 70–99)
Potassium: 4.4 mmol/L (ref 3.5–5.1)
Sodium: 135 mmol/L (ref 135–145)
Total Bilirubin: 0.3 mg/dL (ref 0.3–1.2)
Total Protein: 7.1 g/dL (ref 6.5–8.1)

## 2019-04-03 LAB — CBC WITH DIFFERENTIAL (CANCER CENTER ONLY)
Abs Immature Granulocytes: 0.02 10*3/uL (ref 0.00–0.07)
Basophils Absolute: 0 10*3/uL (ref 0.0–0.1)
Basophils Relative: 1 %
Eosinophils Absolute: 0.1 10*3/uL (ref 0.0–0.5)
Eosinophils Relative: 2 %
HCT: 32.7 % — ABNORMAL LOW (ref 36.0–46.0)
Hemoglobin: 10 g/dL — ABNORMAL LOW (ref 12.0–15.0)
Immature Granulocytes: 0 %
Lymphocytes Relative: 28 %
Lymphs Abs: 1.6 10*3/uL (ref 0.7–4.0)
MCH: 23.6 pg — ABNORMAL LOW (ref 26.0–34.0)
MCHC: 30.6 g/dL (ref 30.0–36.0)
MCV: 77.1 fL — ABNORMAL LOW (ref 80.0–100.0)
Monocytes Absolute: 0.6 10*3/uL (ref 0.1–1.0)
Monocytes Relative: 10 %
Neutro Abs: 3.3 10*3/uL (ref 1.7–7.7)
Neutrophils Relative %: 59 %
Platelet Count: 254 10*3/uL (ref 150–400)
RBC: 4.24 MIL/uL (ref 3.87–5.11)
RDW: 14.4 % (ref 11.5–15.5)
WBC Count: 5.7 10*3/uL (ref 4.0–10.5)
nRBC: 0 % (ref 0.0–0.2)

## 2019-04-03 LAB — IRON AND TIBC
Iron: 17 ug/dL — ABNORMAL LOW (ref 41–142)
Saturation Ratios: 4 % — ABNORMAL LOW (ref 21–57)
TIBC: 466 ug/dL — ABNORMAL HIGH (ref 236–444)
UIBC: 449 ug/dL — ABNORMAL HIGH (ref 120–384)

## 2019-04-03 LAB — FERRITIN: Ferritin: <4 ng/mL — ABNORMAL LOW (ref 11–307)

## 2019-04-03 MED ORDER — SODIUM CHLORIDE 0.9% FLUSH
10.0000 mL | INTRAVENOUS | Status: DC | PRN
  Administered 2019-04-03: 10 mL
  Filled 2019-04-03: qty 10

## 2019-04-03 MED ORDER — HEPARIN SOD (PORK) LOCK FLUSH 100 UNIT/ML IV SOLN
500.0000 [IU] | Freq: Once | INTRAVENOUS | Status: AC | PRN
  Administered 2019-04-03: 500 [IU]
  Filled 2019-04-03: qty 5

## 2019-04-03 MED ORDER — ACETAMINOPHEN 325 MG PO TABS
650.0000 mg | ORAL_TABLET | Freq: Once | ORAL | Status: AC
  Administered 2019-04-03: 650 mg via ORAL

## 2019-04-03 MED ORDER — SODIUM CHLORIDE 0.9 % IV SOLN
Freq: Once | INTRAVENOUS | Status: AC
  Administered 2019-04-03: 12:00:00 via INTRAVENOUS
  Filled 2019-04-03: qty 250

## 2019-04-03 MED ORDER — SODIUM CHLORIDE 0.9% FLUSH
10.0000 mL | Freq: Once | INTRAVENOUS | Status: AC
  Administered 2019-04-03: 10 mL
  Filled 2019-04-03: qty 10

## 2019-04-03 MED ORDER — DIPHENHYDRAMINE HCL 25 MG PO CAPS
ORAL_CAPSULE | ORAL | Status: AC
  Filled 2019-04-03: qty 2

## 2019-04-03 MED ORDER — DIPHENHYDRAMINE HCL 25 MG PO CAPS
50.0000 mg | ORAL_CAPSULE | Freq: Once | ORAL | Status: AC
  Administered 2019-04-03: 50 mg via ORAL

## 2019-04-03 MED ORDER — ACETAMINOPHEN 325 MG PO TABS
ORAL_TABLET | ORAL | Status: AC
  Filled 2019-04-03: qty 2

## 2019-04-03 MED ORDER — TRASTUZUMAB-DKST CHEMO 150 MG IV SOLR
450.0000 mg | Freq: Once | INTRAVENOUS | Status: AC
  Administered 2019-04-03: 450 mg via INTRAVENOUS
  Filled 2019-04-03: qty 21.43

## 2019-04-03 NOTE — Progress Notes (Signed)
Profound Iron deficiency: Will give her 2 doses of IV Iron with the next 2 treatments of Herceptin Denies bleeding

## 2019-04-03 NOTE — Patient Instructions (Signed)

## 2019-04-03 NOTE — Patient Instructions (Signed)
[facility] Discharge Instructions for Patients Receiving Chemotherapy  Today you received the following chemotherapy agents: Ogivri (Trastuzumab)  To help prevent nausea and vomiting after your treatment, we encourage you to take your nausea medication as directed.    If you develop nausea and vomiting that is not controlled by your nausea medication, call the clinic.   BELOW ARE SYMPTOMS THAT SHOULD BE REPORTED IMMEDIATELY:  *FEVER GREATER THAN 100.5 F  *CHILLS WITH OR WITHOUT FEVER  NAUSEA AND VOMITING THAT IS NOT CONTROLLED WITH YOUR NAUSEA MEDICATION  *UNUSUAL SHORTNESS OF BREATH  *UNUSUAL BRUISING OR BLEEDING  TENDERNESS IN MOUTH AND THROAT WITH OR WITHOUT PRESENCE OF ULCERS  *URINARY PROBLEMS  *BOWEL PROBLEMS  UNUSUAL RASH Items with * indicate a potential emergency and should be followed up as soon as possible.  Feel free to call the clinic should you have any questions or concerns. The clinic phone number is [phone].  Please show the [facility] at check-in to the Emergency Department and triage nurse.

## 2019-04-05 ENCOUNTER — Ambulatory Visit: Payer: Medicare Other | Admitting: Physical Therapy

## 2019-04-05 ENCOUNTER — Other Ambulatory Visit: Payer: Self-pay

## 2019-04-05 ENCOUNTER — Encounter: Payer: Self-pay | Admitting: Physical Therapy

## 2019-04-05 DIAGNOSIS — M25551 Pain in right hip: Secondary | ICD-10-CM | POA: Diagnosis not present

## 2019-04-05 DIAGNOSIS — M545 Low back pain, unspecified: Secondary | ICD-10-CM

## 2019-04-05 DIAGNOSIS — R262 Difficulty in walking, not elsewhere classified: Secondary | ICD-10-CM

## 2019-04-05 NOTE — Therapy (Signed)
[place] [place] [place], [place], [address] Phone: [phone]   Fax:  [phone]  Physical Therapy Treatment  Patient Details  Name: Gloria Mckinney MRN: [id number] Date of Birth: 1946/05/05 Referring Provider (PT): Deveshwar   Encounter Date: 04/05/2019  PT End of Session - 04/05/19 1750    Visit Number  16    Date for PT Re-Evaluation  04/29/19    PT Start Time  1700    PT Stop Time  1755    PT Time Calculation (min)  55 min    Activity Tolerance  Patient tolerated treatment well    Behavior During Therapy  [facility] for tasks assessed/performed       Past Medical History:  Diagnosis Date  . GERD (gastroesophageal reflux disease)   . History of radiation therapy 01/16/19- 02/13/19   left breast 15 fractions of 2.67 Gy for a total of 40.05 Gy. Left breast boost 5 fractions of 2 Gy for a total of 10 Gy  . Hypertension   . Mixed hyperlipidemia   . OA (osteoarthritis)    knee right  . Peripheral neuropathy   . PVD (peripheral vascular disease) ([place])   . Type 2 diabetes mellitus treated with insulin [facility])    endocrinologist--- dr Providence Crosby Beedy    Past Surgical History:  Procedure Laterality Date  . BREAST BIOPSY Bilateral 2000   benign  . BREAST LUMPECTOMY WITH RADIOACTIVE SEED AND SENTINEL LYMPH NODE BIOPSY Left 08/15/2018   Procedure: LEFT BREAST LUMPECTOMY WITH RADIOACTIVE SEED AND LEFT AXILLARY DEEP SENTINEL LYMPH NODE BIOPSY INJECT BLUE DYE LEFT BREAST;  Surgeon: Fanny Skates, MD;  Location: [facility];  Service: General;  Laterality: Left;  . PORTACATH PLACEMENT Right 08/15/2018   Procedure: INSERTION PORT-A-CATH WITH ULTRASOUND;  Surgeon: Fanny Skates, MD;  Location: [facility];  Service: General;  Laterality: Right;  . SHOULDER ARTHROSCOPY Right 06-16-2002   dr graves  . SHOULDER OPEN ROTATOR CUFF REPAIR Right 07-28-2000    dr Berenice Primas  . VAGINAL HYSTERECTOMY   1992   with BSO  . WRIST SURGERY  2007    There were no vitals filed for this visit.  Subjective Assessment - 04/05/19 1704    Subjective  Still with pain standing and walking    Currently in Pain?  Yes    Pain Score  3     Pain Location  Back    Aggravating Factors   reports increased pain now due to just cooking a lot                       [facility] Adult PT Treatment/Exercise - 04/05/19 0001      Lumbar Exercises: Machines for Strengthening   Other Lumbar Machine Exercise  seated row, lats 20# 2x10      Lumbar Exercises: Seated   Other Seated Lumbar Exercises  pelvic mobility on sit fit and then scapular stabilization with red tband, on sit fit 2.5 # marches and LAQ's, with weighted ball side to side      Lumbar Exercises: Supine   Bridge with [employer]  20 reps;1 second    Other Supine Lumbar Exercises  feet on ball K2C, trunk rotation, small bridge and isometric abs      Moist Heat Therapy   Number Minutes Moist Heat  10 Minutes    Moist Heat Location  Lumbar Spine      Electrical Stimulation  Electrical Stimulation Location  L/S area     Electrical Stimulation Action  IFC    Electrical Stimulation Parameters  left sidelying    Electrical Stimulation Goals  Pain      Manual Therapy   Manual Therapy  Soft tissue mobilization    Soft tissue mobilization  bilateral Lumbar pareapsinals             PT Education - 04/05/19 1750    Education Details  spoke to patient and daughter about HEP, use of ball, educated on traction    Person(s) Educated  Patient    Methods  Explanation;Demonstration;Verbal cues;Tactile cues;Handout    Comprehension  Verbalized understanding;Returned demonstration;Verbal cues required       PT Short Term Goals - 02/07/19 1655      PT SHORT TERM GOAL #1   Title  independent with HEP    Status  Achieved        PT Long Term Goals - 04/05/19 1752      PT LONG TERM GOAL #3   Title  report sleeping better by 50%     Status  Partially Met            Plan - 04/05/19 1751    Clinical Impression Statement  Patient report that the shortness of breath last time may have been due to low iron.  She is still with the pain, so I started educating about trying traction next visit, her and her daughter agreed    PT Next Visit Plan  try traction    Consulted and Agree with Plan of Care  Patient       Patient will benefit from skilled therapeutic intervention in order to improve the following deficits and impairments:  Abnormal gait, Improper body mechanics, Pain, Postural dysfunction, Increased muscle spasms, Decreased mobility, Decreased activity tolerance, Decreased endurance, Decreased range of motion, Decreased strength, Impaired flexibility, Difficulty walking  Visit Diagnosis: Pain in right hip  Acute right-sided low back pain without sciatica  Difficulty in walking, not elsewhere classified     Problem List Patient Active Problem List   Diagnosis Date Noted  . Port-A-Cath in place 09/19/2018  . Malignant neoplasm of upper-inner quadrant of left breast in female, estrogen receptor positive ([place]) 07/27/2018  . Dyspnea on exertion 10/21/2016  . Type 2 diabetes mellitus without complication, without long-term current use of insulin ([place]) 10/21/2016  . Dyslipidemia 10/21/2016    Sumner Boast., PT 04/05/2019, 5:56 PM  [place] [address] [place], [place], [address] Phone: [phone]   Fax:  [phone]  Name: Gloria Mckinney MRN: [id number] Date of Birth: [birth year]

## 2019-04-10 ENCOUNTER — Ambulatory Visit: Payer: Medicare Other | Admitting: Physical Therapy

## 2019-04-10 ENCOUNTER — Encounter: Payer: Self-pay | Admitting: Physical Therapy

## 2019-04-10 ENCOUNTER — Other Ambulatory Visit: Payer: Self-pay

## 2019-04-10 DIAGNOSIS — M545 Low back pain, unspecified: Secondary | ICD-10-CM

## 2019-04-10 DIAGNOSIS — M25551 Pain in right hip: Secondary | ICD-10-CM

## 2019-04-10 DIAGNOSIS — R262 Difficulty in walking, not elsewhere classified: Secondary | ICD-10-CM

## 2019-04-10 NOTE — Therapy (Signed)
Gloria Mckinney, [place], [address] Phone: [phone]   Fax:  [phone]  Physical Therapy Treatment  Patient Details  Name: Gloria Mckinney MRN: [id number] Date of Birth: [birth year] Referring Provider (PT): Gloria Mckinney  PT End of Session - 04/10/19 1735    Visit Number  17    Date for PT Re-Evaluation  04/29/19    PT Start Time  1700    PT Stop Time  1750    PT Time Calculation (min)  50 min    Activity Tolerance  Patient tolerated treatment well    Behavior During Therapy  [facility]loria [facility]ck[facility] for tasks assessed/performed       Past Medical History:  Diagnosis Date  . GERD (gastroesophageal reflux disease)   . History of radiation therapy 01/16/19- 02/13/19   left breast 15 fractions of 2.67 Gy for a total of 40.05 Gy. Left breast boost 5 fractions of 2 Gy for a total of 10 Gy  . Hypertension   . Mixed hyperlipidemia   . OA (osteoarthritis)    knee right  . Peripheral neuropathy   . PVD (peripheral vascular disease) ([place])   . Type 2 diabetes mellitus treated with insulin [facility])    endocrinologist--- dr Providence Crosby Barreras    Past Surgical History:  Procedure Laterality Date  . BREAST BIOPSY Bilateral 2000   benign  . BREAST LUMPECTOMY WITH RADIOACTIVE SEED AND SENTINEL LYMPH NODE BIOPSY Left 08/15/2018   Procedure: LEFT BREAST LUMPECTOMY WITH RADIOACTIVE SEED AND LEFT AXILLARY DEEP SENTINEL LYMPH NODE BIOPSY INJECT BLUE DYE LEFT BREAST;  Surgeon: Gloria Skates, MD;  Location: [facility];  Service: General;  Laterality: Left;  . PORTACATH PLACEMENT Right 08/15/2018   Procedure: INSERTION PORT-A-CATH WITH ULTRASOUND;  Surgeon: Gloria Skates, MD;  Location: [facility];  Service: General;  Laterality: Right;  . SHOULDER ARTHROSCOPY Right 06-16-2002   dr graves  . SHOULDER OPEN ROTATOR CUFF REPAIR Right 07-28-2000    dr Berenice Primas  . VAGINAL HYSTERECTOMY   1992   with BSO  . WRIST SURGERY  2007    There were no vitals filed for this visit.  Subjective Assessment - 04/10/19 1707    Subjective  Patient reports that her pain is a little worse today.  "Maybe the weather"    Currently in Pain?  Yes    Pain Score  5     Pain Location  Back    Pain Orientation  Lower                       [facility] Adult PT Treatment/Exercise - 04/10/19 0001      Lumbar Exercises: Aerobic   Elliptical  1 minute I=9 R=4    Nustep  level 5x 6 minutes      Lumbar Exercises: Machines for Strengthening   Other Lumbar Machine Exercise  seated row, lats 20# 2x10      Lumbar Exercises: Standing   Other Standing Lumbar Exercises  7# farmers carry unilateral    Other Standing Lumbar Exercises  2.5# ankle weights hip flexion, abduction and extension 2x10 each      Modalities   Modalities  Traction      Traction   Type of Traction  Lumbar    Min (lbs)  50    Max (lbs)  65    Hold Time  60    Rest Time  20  Time  14               PT Short Term Goals - 02/07/19 1655      PT SHORT TERM GOAL #1   Title  independent with HEP    Status  Achieved        PT Long Term Goals - 04/10/19 1737      PT LONG TERM GOAL #3   Title  report sleeping better by 50%    Status  Partially Met      PT LONG TERM GOAL #4   Title  cook a meal without pain    Status  Partially Met            Plan - 04/10/19 1736    Clinical Impression Statement  We added traction today.  She tolerated it well, but was sore prior to the session.  We will monitor the lasting relief effects of traction and see if this is something we need to pursue    PT Treatment/Interventions  ADLs/Self Care Home Management;Iontophoresis 34m/ml Dexamethasone;Moist Heat;Ultrasound;Cryotherapy;Therapeutic activities;Therapeutic exercise;Balance training;Neuromuscular re-education;Manual techniques;Dry needling;Patient/family education;Traction    PT Next Visit Plan  see if  traction helped    Consulted and Agree with Plan of Care  Patient       Patient will benefit from skilled therapeutic intervention in order to improve the following deficits and impairments:  Abnormal gait, Improper body mechanics, Pain, Postural dysfunction, Increased muscle spasms, Decreased mobility, Decreased activity tolerance, Decreased endurance, Decreased range of motion, Decreased strength, Impaired flexibility, Difficulty walking  Visit Diagnosis: Pain in right hip  Acute right-sided low back pain without sciatica  Difficulty in walking, not elsewhere classified     Problem List Patient Active Problem List   Diagnosis Date Noted  . Port-A-Cath in place 09/19/2018  . Malignant neoplasm of upper-inner quadrant of left breast in female, estrogen receptor positive ([place] 07/27/2018  . Dyspnea on exertion 10/21/2016  . Type 2 diabetes mellitus without complication, without long-term current use of insulin ([place] 10/21/2016  . Dyslipidemia 10/21/2016    ASumner Mckinney, PT [place], 5:38 PM  [address] [address]Phone: [phone]  Fax:  [phone] Name: SLaraina SultonMRN: [id number] of Birth: 6Jun 05, 1947

## 2019-04-12 ENCOUNTER — Other Ambulatory Visit: Payer: Self-pay

## 2019-04-12 ENCOUNTER — Ambulatory Visit: Payer: Medicare Other | Attending: Rheumatology | Admitting: Physical Therapy

## 2019-04-12 ENCOUNTER — Encounter: Payer: Self-pay | Admitting: Physical Therapy

## 2019-04-12 DIAGNOSIS — M25551 Pain in right hip: Secondary | ICD-10-CM | POA: Diagnosis not present

## 2019-04-12 DIAGNOSIS — M545 Low back pain, unspecified: Secondary | ICD-10-CM

## 2019-04-12 DIAGNOSIS — R262 Difficulty in walking, not elsewhere classified: Secondary | ICD-10-CM | POA: Diagnosis present

## 2019-04-12 NOTE — Therapy (Signed)
[place], [place], [address] Phone: [phone]   Fax:  [phone]  Physical Therapy Treatment  Patient Details  Name: Gloria Mckinney MRN: [id number] Date of Birth: November 14, 1945 Referring Provider (PT): Deveshwar   Encounter Date: 04/12/2019  PT End of Session - 04/12/19 1725    Visit Number  18    Date for PT Re-Evaluation  04/29/19    PT Start Time  1704    PT Stop Time  1749    PT Time Calculation (min)  45 min    Activity Tolerance  Patient tolerated treatment well    Behavior During Therapy  [facility] for tasks assessed/performed       Past Medical History:  Diagnosis Date  . GERD (gastroesophageal reflux disease)   . History of radiation therapy 01/16/19- 02/13/19   left breast 15 fractions of 2.67 Gy for a total of 40.05 Gy. Left breast boost 5 fractions of 2 Gy for a total of 10 Gy  . Hypertension   . Mixed hyperlipidemia   . OA (osteoarthritis)    knee right  . Peripheral neuropathy   . PVD (peripheral vascular disease) ([place])   . Type 2 diabetes mellitus treated with insulin [facility])    endocrinologist--- dr Providence Crosby Col[place]i    Past Surgical History:  Procedure Laterality Date  . BREAST BIOPSY Bilateral 2000   benign  . BREAST LUMPECTOMY WITH RADIOACTIVE SEED AND SENTINEL LYMPH NODE BIOPSY Left 08/15/2018   Procedure: LEFT BREAST LUMPECTOMY WITH RADIOACTIVE SEED AND LEFT AXILLARY DEEP SENTINEL LYMPH NODE BIOPSY INJECT BLUE DYE LEFT BREAST;  Surgeon: Fanny Skates, MD;  Location: [facility];  Service: General;  Laterality: Left;  . PORTACATH PLACEMENT Right 08/15/2018   Procedure: INSERTION PORT-A-CATH WITH ULTRASOUND;  Surgeon: Fanny Skates, MD;  Location: [facility];  Service: General;  Laterality: Right;  . SHOULDER ARTHROSCOPY Right 06-16-2002   dr graves  . SHOULDER OPEN ROTATOR CUFF REPAIR Right 07-28-2000    dr Berenice Primas  . VAGINAL HYSTERECTOMY   1992   with BSO  . WRIST SURGERY  2007    There were no vitals filed for this visit.  Subjective Assessment - 04/12/19 1709    Subjective  Patient had a little more pain after the traction, she is still sore    Currently in Pain?  Yes    Pain Score  6     Pain Location  Back    Pain Orientation  Lower    Pain Descriptors / Indicators  Sore    Aggravating Factors   worse with standing                       [facility] Adult PT Treatment/Exercise - 04/12/19 0001      Lumbar Exercises: Aerobic   Elliptical  2 minutes I=10, R=4    Nustep  level 5x 5 minutes      Lumbar Exercises: Machines for Strengthening   Leg Press  20# 2x10    Other Lumbar Machine Exercise  seated row, lats 20# 2x10      Moist Heat Therapy   Number Minutes Moist Heat  10 Minutes    Moist Heat Location  Lumbar Spine      Electrical Stimulation   Electrical Stimulation Location  L/S area     Electrical Stimulation Action  IFC    Electrical Stimulation Parameters  sitting    Electrical Stimulation  Goals  Pain      Traction   Type of Traction  Lumbar    Min (lbs)  40    Max (lbs)  60    Hold Time  60    Rest Time  20    Time  14               PT Short Term Goals - 02/07/19 1655      PT SHORT TERM GOAL #1   Title  independent with HEP    Status  Achieved        PT Long Term Goals - 04/12/19 1727      PT LONG TERM GOAL #3   Title  report sleeping better by 50%    Status  Partially Met            Plan - 04/12/19 1726    Clinical Impression Statement  Patient was sore after the traction, we added a little bit of cardio and the leg press today, I went back to the traction with a little less weight and then followed with heat and estim for soreness    PT Next Visit Plan  again assess traction    Consulted and Agree with Plan of Care  Patient       Patient will benefit from skilled therapeutic intervention in order to improve the following deficits and impairments:   Abnormal gait, Improper body mechanics, Pain, Postural dysfunction, Increased muscle spasms, Decreased mobility, Decreased activity tolerance, Decreased endurance, Decreased range of motion, Decreased strength, Impaired flexibility, Difficulty walking  Visit Diagnosis: Pain in right hip  Acute right-sided low back pain without sciatica  Difficulty in walking, not elsewhere classified     Problem List Patient Active Problem List   Diagnosis Date Noted  . Port-A-Cath in place 09/19/2018  . Malignant neoplasm of upper-inner quadrant of left breast in female, estrogen receptor positive ([place]) 07/27/2018  . Dyspnea on exertion 10/21/2016  . Type 2 diabetes mellitus without complication, without long-term current use of insulin ([place]) 10/21/2016  . Dyslipidemia 10/21/2016    Sumner Boast., PT 04/12/2019, 5:28 PM  [address] [place], [place], [address] Phone: [phone]   Fax:  [phone]  Name: Gloria Mckinney MRN: [id number] Date of Birth: [birth year]

## 2019-04-19 ENCOUNTER — Ambulatory Visit: Payer: Medicare Other | Admitting: Physical Therapy

## 2019-04-19 ENCOUNTER — Other Ambulatory Visit: Payer: Self-pay

## 2019-04-19 ENCOUNTER — Encounter: Payer: Self-pay | Admitting: Physical Therapy

## 2019-04-19 DIAGNOSIS — R262 Difficulty in walking, not elsewhere classified: Secondary | ICD-10-CM

## 2019-04-19 DIAGNOSIS — M25551 Pain in right hip: Secondary | ICD-10-CM

## 2019-04-19 DIAGNOSIS — M545 Low back pain, unspecified: Secondary | ICD-10-CM

## 2019-04-19 NOTE — Therapy (Signed)
[place], [place], [address] Phone: [phone]   Fax:  [phone]  Physical Therapy Treatment  Patient Details  Name: Gloria Mckinney MRN: [id number] Date of Birth: 21-Jul-1946 Referring Provider (PT): Deveshwar   Encounter Date: 04/19/2019  PT End of Session - 04/19/19 1754    Visit Number  19    Date for PT Re-Evaluation  04/29/19    PT Start Time  1703    PT Stop Time  1801    PT Time Calculation (min)  58 min    Activity Tolerance  Patient limited by fatigue;Patient tolerated treatment well    Behavior During Therapy  [facility] for tasks assessed/performed       Past Medical History:  Diagnosis Date  . GERD (gastroesophageal reflux disease)   . History of radiation therapy 01/16/19- 02/13/19   left breast 15 fractions of 2.67 Gy for a total of 40.05 Gy. Left breast boost 5 fractions of 2 Gy for a total of 10 Gy  . Hypertension   . Mixed hyperlipidemia   . OA (osteoarthritis)    knee right  . Peripheral neuropathy   . PVD (peripheral vascular disease) ([place])   . Type 2 diabetes mellitus treated with insulin [facility])    endocrinologist--- dr Providence Crosby [place]    Past Surgical History:  Procedure Laterality Date  . BREAST BIOPSY Bilateral 2000   benign  . BREAST LUMPECTOMY WITH RADIOACTIVE SEED AND SENTINEL LYMPH NODE BIOPSY Left 08/15/2018   Procedure: LEFT BREAST LUMPECTOMY WITH RADIOACTIVE SEED AND LEFT AXILLARY DEEP SENTINEL LYMPH NODE BIOPSY INJECT BLUE DYE LEFT BREAST;  Surgeon: Fanny Skates, MD;  Location: [facility];  Service: General;  Laterality: Left;  . PORTACATH PLACEMENT Right 08/15/2018   Procedure: INSERTION PORT-A-CATH WITH ULTRASOUND;  Surgeon: Fanny Skates, MD;  Location: [facility];  Service: General;  Laterality: Right;  . SHOULDER ARTHROSCOPY Right 06-16-2002   dr graves  . SHOULDER OPEN ROTATOR CUFF REPAIR Right 07-28-2000    dr Berenice Primas   . VAGINAL HYSTERECTOMY  1992   with BSO  . WRIST SURGERY  2007    There were no vitals filed for this visit.  Subjective Assessment - 04/19/19 1711    Subjective  Patient reports that she was a little better after the second time on traction, still biggest c/o pain is with standing to cook    Currently in Pain?  Yes    Pain Score  2     Pain Location  Back    Pain Orientation  Lower    Aggravating Factors   standing > 1 hour                       [facility] Adult PT Treatment/Exercise - 04/19/19 0001      Ambulation/Gait   Gait Comments  around the building 1 lap, she did start limping on the left up the hill and was short of breath aftter the lap and had to sit , difficulty going up slope      Lumbar Exercises: Standing   Other Standing Lumbar Exercises  resisted gait all directions'      Lumbar Exercises: Seated   Other Seated Lumbar Exercises  weighted ball chest press, overhead lift and side to side for core stability      Moist Heat Therapy   Number Minutes Moist Heat  10 Minutes    Moist Heat Location  Lumbar Spine      Electrical Stimulation   Electrical Stimulation Location  L/S area     Electrical Stimulation Action  IFC    Electrical Stimulation Parameters  sidelying    Electrical Stimulation Goals  Pain      Manual Therapy   Manual Therapy  Soft tissue mobilization    Soft tissue mobilization  bilateral Lumbar pareapsinals               PT Short Term Goals - 02/07/19 1655      PT SHORT TERM GOAL #1   Title  independent with HEP    Status  Achieved        PT Long Term Goals - 04/19/19 1756      PT LONG TERM GOAL #4   Title  cook a meal without pain    Status  Partially Met            Plan - 04/19/19 1754    Clinical Impression Statement  I did a lot of questioning today as it seems like she is able to tolerate standing and walking with less pain however she seems lethargic and looks like she struggles more with some of  the exercises, the daughter reports that over the past week they upped a medication and they think that this is a sideeffect, they have called the MD    PT Next Visit Plan  would love to get her back moving again especially walking, but she is lethargic and fatigues easily, spoke to them about walking program decreaseing the reccurrence if CA    Consulted and Agree with Plan of Care  Patient;Family member/caregiver    Family Member Consulted  daughter       Patient will benefit from skilled therapeutic intervention in order to improve the following deficits and impairments:  Abnormal gait, Improper body mechanics, Pain, Postural dysfunction, Increased muscle spasms, Decreased mobility, Decreased activity tolerance, Decreased endurance, Decreased range of motion, Decreased strength, Impaired flexibility, Difficulty walking  Visit Diagnosis: Acute right-sided low back pain without sciatica  Pain in right hip  Difficulty in walking, not elsewhere classified     Problem List Patient Active Problem List   Diagnosis Date Noted  . Port-A-Cath in place 09/19/2018  . Malignant neoplasm of upper-inner quadrant of left breast in female, estrogen receptor positive ([place]) 07/27/2018  . Dyspnea on exertion 10/21/2016  . Type 2 diabetes mellitus without complication, without long-term current use of insulin ([place]) 10/21/2016  . Dyslipidemia 10/21/2016    Sumner Boast., PT 04/19/2019, 5:57 PM  [address] [place], [place], [address] Phone: [phone]   Fax:  [phone]  Name: Gloria Mckinney MRN: [id number] Date of Birth: 1946-03-24

## 2019-04-24 ENCOUNTER — Ambulatory Visit: Payer: Federal, State, Local not specified - PPO

## 2019-04-24 ENCOUNTER — Inpatient Hospital Stay: Payer: Medicare Other | Attending: Hematology and Oncology

## 2019-04-24 ENCOUNTER — Other Ambulatory Visit: Payer: Self-pay

## 2019-04-24 VITALS — BP 148/76 | HR 70 | Temp 99.4°F | Resp 18

## 2019-04-24 DIAGNOSIS — Z170 Estrogen receptor positive status [ER+]: Secondary | ICD-10-CM | POA: Insufficient documentation

## 2019-04-24 DIAGNOSIS — C50212 Malignant neoplasm of upper-inner quadrant of left female breast: Secondary | ICD-10-CM | POA: Diagnosis not present

## 2019-04-24 DIAGNOSIS — Z5112 Encounter for antineoplastic immunotherapy: Secondary | ICD-10-CM | POA: Insufficient documentation

## 2019-04-24 DIAGNOSIS — Z23 Encounter for immunization: Secondary | ICD-10-CM | POA: Diagnosis not present

## 2019-04-24 DIAGNOSIS — Z95828 Presence of other vascular implants and grafts: Secondary | ICD-10-CM

## 2019-04-24 DIAGNOSIS — Z0000 Encounter for general adult medical examination without abnormal findings: Secondary | ICD-10-CM

## 2019-04-24 DIAGNOSIS — D509 Iron deficiency anemia, unspecified: Secondary | ICD-10-CM

## 2019-04-24 MED ORDER — HEPARIN SOD (PORK) LOCK FLUSH 100 UNIT/ML IV SOLN
500.0000 [IU] | Freq: Once | INTRAVENOUS | Status: AC | PRN
  Administered 2019-04-24: 500 [IU]
  Filled 2019-04-24: qty 5

## 2019-04-24 MED ORDER — SODIUM CHLORIDE 0.9 % IV SOLN
Freq: Once | INTRAVENOUS | Status: AC
  Administered 2019-04-24: 13:00:00 via INTRAVENOUS
  Filled 2019-04-24: qty 250

## 2019-04-24 MED ORDER — DIPHENHYDRAMINE HCL 25 MG PO CAPS
ORAL_CAPSULE | ORAL | Status: AC
  Filled 2019-04-24: qty 2

## 2019-04-24 MED ORDER — INFLUENZA VAC A&B SA ADJ QUAD 0.5 ML IM PRSY
0.5000 mL | PREFILLED_SYRINGE | INTRAMUSCULAR | Status: AC
  Administered 2019-04-24: 0.5 mL via INTRAMUSCULAR

## 2019-04-24 MED ORDER — TRASTUZUMAB-DKST CHEMO 150 MG IV SOLR
450.0000 mg | Freq: Once | INTRAVENOUS | Status: AC
  Administered 2019-04-24: 450 mg via INTRAVENOUS
  Filled 2019-04-24: qty 21.43

## 2019-04-24 MED ORDER — ACETAMINOPHEN 325 MG PO TABS
650.0000 mg | ORAL_TABLET | Freq: Once | ORAL | Status: AC
  Administered 2019-04-24: 650 mg via ORAL

## 2019-04-24 MED ORDER — INFLUENZA VAC A&B SA ADJ QUAD 0.5 ML IM PRSY
PREFILLED_SYRINGE | INTRAMUSCULAR | Status: AC
  Filled 2019-04-24: qty 0.5

## 2019-04-24 MED ORDER — SODIUM CHLORIDE 0.9% FLUSH
10.0000 mL | INTRAVENOUS | Status: DC | PRN
  Administered 2019-04-24: 10 mL
  Filled 2019-04-24: qty 10

## 2019-04-24 MED ORDER — DIPHENHYDRAMINE HCL 25 MG PO CAPS
50.0000 mg | ORAL_CAPSULE | Freq: Once | ORAL | Status: AC
  Administered 2019-04-24: 50 mg via ORAL

## 2019-04-24 MED ORDER — ACETAMINOPHEN 325 MG PO TABS
ORAL_TABLET | ORAL | Status: AC
  Filled 2019-04-24: qty 2

## 2019-04-24 MED ORDER — SODIUM CHLORIDE 0.9 % IV SOLN
510.0000 mg | Freq: Once | INTRAVENOUS | Status: AC
  Administered 2019-04-24: 510 mg via INTRAVENOUS
  Filled 2019-04-24: qty 510

## 2019-04-24 NOTE — Patient Instructions (Signed)
[facility] Discharge Instructions for Patients Receiving Chemotherapy  Today you received the following chemotherapy agents: Trastuzumab  To help prevent nausea and vomiting after your treatment, we encourage you to take your nausea medication as directed.    If you develop nausea and vomiting that is not controlled by your nausea medication, call the clinic.   BELOW ARE SYMPTOMS THAT SHOULD BE REPORTED IMMEDIATELY:  *FEVER GREATER THAN 100.5 F  *CHILLS WITH OR WITHOUT FEVER  NAUSEA AND VOMITING THAT IS NOT CONTROLLED WITH YOUR NAUSEA MEDICATION  *UNUSUAL SHORTNESS OF BREATH  *UNUSUAL BRUISING OR BLEEDING  TENDERNESS IN MOUTH AND THROAT WITH OR WITHOUT PRESENCE OF ULCERS  *URINARY PROBLEMS  *BOWEL PROBLEMS  UNUSUAL RASH Items with * indicate a potential emergency and should be followed up as soon as possible.  Feel free to call the clinic should you have any questions or concerns. The clinic phone number is [phone].  Please show the [facility] at check-in to the Emergency Department and triage nurse.  Ferumoxytol injection What is this medicine? FERUMOXYTOL is an iron complex. Iron is used to make healthy red blood cells, which carry oxygen and nutrients throughout the body. This medicine is used to treat iron deficiency anemia. This medicine may be used for other purposes; ask your health care provider or pharmacist if you have questions. COMMON BRAND NAME(S): Feraheme What should I tell my health care provider before I take this medicine? They need to know if you have any of these conditions:  anemia not caused by low iron levels  high levels of iron in the blood  magnetic resonance imaging (MRI) test scheduled  an unusual or allergic reaction to iron, other medicines, foods, dyes, or preservatives  pregnant or trying to get pregnant  breast-feeding How should I use this medicine? This medicine is for injection into a vein. It is  given by a health care professional in a hospital or clinic setting. Talk to your pediatrician regarding the use of this medicine in children. Special care may be needed. Overdosage: If you think you have taken too much of this medicine contact a poison control center or emergency room at once. NOTE: This medicine is only for you. Do not share this medicine with others. What if I miss a dose? It is important not to miss your dose. Call your doctor or health care professional if you are unable to keep an appointment. What may interact with this medicine? This medicine may interact with the following medications:  other iron products This list may not describe all possible interactions. Give your health care provider a list of all the medicines, herbs, non-prescription drugs, or dietary supplements you use. Also tell them if you smoke, drink alcohol, or use illegal drugs. Some items may interact with your medicine. What should I watch for while using this medicine? Visit your doctor or healthcare professional regularly. Tell your doctor or healthcare professional if your symptoms do not start to get better or if they get worse. You may need blood work done while you are taking this medicine. You may need to follow a special diet. Talk to your doctor. Foods that contain iron include: whole grains/cereals, dried fruits, beans, or peas, leafy green vegetables, and organ meats (liver, kidney). What side effects may I notice from receiving this medicine? Side effects that you should report to your doctor or health care professional as soon as possible:  allergic reactions like skin rash, itching or hives, swelling  of the face, lips, or tongue  breathing problems  changes in blood pressure  feeling faint or lightheaded, falls  fever or chills  flushing, sweating, or hot feelings  swelling of the ankles or feet Side effects that usually do not require medical attention (report to your doctor or  health care professional if they continue or are bothersome):  diarrhea  headache  nausea, vomiting  stomach pain This list may not describe all possible side effects. Call your doctor for medical advice about side effects. You may report side effects to FDA at 1-800-FDA-1088. Where should I keep my medicine? This drug is given in a hospital or clinic and will not be stored at home. NOTE: This sheet is a summary. It may not cover all possible information. If you have questions about this medicine, talk to your doctor, pharmacist, or health care provider.  2020 Elsevier/Gold Standard (2016-09-14 20:21:10)

## 2019-04-26 ENCOUNTER — Ambulatory Visit: Payer: Medicare Other | Admitting: Physical Therapy

## 2019-04-26 ENCOUNTER — Other Ambulatory Visit: Payer: Self-pay

## 2019-04-26 ENCOUNTER — Encounter: Payer: Self-pay | Admitting: Physical Therapy

## 2019-04-26 DIAGNOSIS — M25551 Pain in right hip: Secondary | ICD-10-CM | POA: Diagnosis not present

## 2019-04-26 NOTE — Patient Instructions (Signed)

## 2019-04-26 NOTE — Therapy (Signed)
[place], [place], [address] Phone: [phone]   Fax:  [phone] Progress Note Reporting Period 03/02/19 to 04/26/19 for visits 11-20   See note below for Objective Data and Assessment of Progress/Goals.      Physical Therapy Treatment  Patient Details  Name: Gloria Mckinney MRN: [id number] Date of Birth: Jul 29, 1946 Referring Provider (PT): Deveshwar   Encounter Date: 04/26/2019  PT End of Session - 04/26/19 1759    Visit Number  20    Date for PT Re-Evaluation  04/29/19    PT Start Time  6962    PT Stop Time  1750    PT Time Calculation (min)  46 min    Activity Tolerance  Patient tolerated treatment well;Patient limited by pain    Behavior During Therapy  [facility] for tasks assessed/performed       Past Medical History:  Diagnosis Date  . GERD (gastroesophageal reflux disease)   . History of radiation therapy 01/16/19- 02/13/19   left breast 15 fractions of 2.67 Gy for a total of 40.05 Gy. Left breast boost 5 fractions of 2 Gy for a total of 10 Gy  . Hypertension   . Mixed hyperlipidemia   . OA (osteoarthritis)    knee right  . Peripheral neuropathy   . PVD (peripheral vascular disease) ([place])   . Type 2 diabetes mellitus treated with insulin [facility])    endocrinologist--- dr Providence Crosby Johannes    Past Surgical History:  Procedure Laterality Date  . BREAST BIOPSY Bilateral 2000   benign  . BREAST LUMPECTOMY WITH RADIOACTIVE SEED AND SENTINEL LYMPH NODE BIOPSY Left 08/15/2018   Procedure: LEFT BREAST LUMPECTOMY WITH RADIOACTIVE SEED AND LEFT AXILLARY DEEP SENTINEL LYMPH NODE BIOPSY INJECT BLUE DYE LEFT BREAST;  Surgeon: Fanny Skates, MD;  Location: [place];  Service: General;  Laterality: Left;  . PORTACATH PLACEMENT Right 08/15/2018   Procedure: INSERTION PORT-A-CATH WITH ULTRASOUND;  Surgeon: Fanny Skates, MD;  Location: [place];  Service:  General;  Laterality: Right;  . SHOULDER ARTHROSCOPY Right 06-16-2002   dr graves  . SHOULDER OPEN ROTATOR CUFF REPAIR Right 07-28-2000    dr Berenice Primas  . VAGINAL HYSTERECTOMY  1992   with BSO  . WRIST SURGERY  2007    There were no vitals filed for this visit.  Subjective Assessment - 04/26/19 1757    Subjective  Patient reports that on [place] she started having increased right hip, buttock and back pain, she is unsure of why    Currently in Pain?  Yes    Pain Score  6     Pain Location  Hip    Pain Orientation  Right    Aggravating Factors   unsure of why the increase of pain                       [facility] Adult PT Treatment/Exercise - 04/26/19 0001      Moist Heat Therapy   Number Minutes Moist Heat  15 Minutes    Moist Heat Location  Lumbar Spine      Electrical Stimulation   Electrical Stimulation Location  L/S area     Electrical Stimulation Action  IFC    Electrical Stimulation Parameters  sidelying    Electrical Stimulation Goals  Pain      Manual Therapy   Manual Therapy  Soft tissue mobilization    Soft tissue mobilization  right buttock and lumbar area       Trigger Point Dry Needling - 04/26/19 0001    Consent Given?  Yes    Education Handout Provided  Yes    Muscles Treated Back/Hip  Gluteus medius;Gluteus maximus;Piriformis    Gluteus Medius Response  Palpable increased muscle length    Gluteus Maximus Response  Palpable increased muscle length    Piriformis Response  Palpable increased muscle length             PT Short Term Goals - 02/07/19 1655      PT SHORT TERM GOAL #1   Title  independent with HEP    Status  Achieved        PT Long Term Goals - 04/26/19 1802      PT LONG TERM GOAL #4   Title  cook a meal without pain    Status  Partially Met            Plan - 04/26/19 1800    Clinical Impression Statement  Patient with increased pain over the past two days, she is unsure of why.  The daughter is with her today  and tells me that her mom has been doing so much better since being in PT, she reports that they are walking most days now and the mom is able to get up and down on her own where as previoulsy she needed help and could not walk for exercise.  She does have increased tenderness in the right gluteal area, I did some DN here today    PT Next Visit Plan  see if the DN helped    Consulted and Agree with Plan of Care  Patient;Family member/caregiver    Family Member Consulted  daughter       Patient will benefit from skilled therapeutic intervention in order to improve the following deficits and impairments:  Abnormal gait, Improper body mechanics, Pain, Postural dysfunction, Increased muscle spasms, Decreased mobility, Decreased activity tolerance, Decreased endurance, Decreased range of motion, Decreased strength, Impaired flexibility, Difficulty walking  Visit Diagnosis: Acute right-sided low back pain without sciatica  Pain in right hip  Difficulty in walking, not elsewhere classified     Problem List Patient Active Problem List   Diagnosis Date Noted  . Port-A-Cath in place 09/19/2018  . Malignant neoplasm of upper-inner quadrant of left breast in female, estrogen receptor positive ([place]) 07/27/2018  . Dyspnea on exertion 10/21/2016  . Type 2 diabetes mellitus without complication, without long-term current use of insulin ([place]) 10/21/2016  . Dyslipidemia 10/21/2016    Sumner Boast., PT 04/26/2019, 6:02 PM  [address] [place] [address] [place], [place], [address] Phone: [phone]   Fax:  [phone]  Name: Gloria Mckinney MRN: [id number] Date of Birth: August 30, 1945

## 2019-05-01 ENCOUNTER — Other Ambulatory Visit: Payer: Self-pay

## 2019-05-01 ENCOUNTER — Ambulatory Visit: Payer: Medicare Other | Admitting: Physical Therapy

## 2019-05-01 ENCOUNTER — Encounter: Payer: Self-pay | Admitting: Physical Therapy

## 2019-05-01 DIAGNOSIS — M25551 Pain in right hip: Secondary | ICD-10-CM

## 2019-05-01 DIAGNOSIS — R262 Difficulty in walking, not elsewhere classified: Secondary | ICD-10-CM

## 2019-05-01 DIAGNOSIS — M545 Low back pain, unspecified: Secondary | ICD-10-CM

## 2019-05-01 NOTE — Therapy (Signed)
[place], [place], [address] Phone: [phone]   Fax:  [phone]  Physical Therapy Treatment  Patient Details  Name: Gloria Mckinney MRN: [id number] Date of Birth: 30-Nov-1945 Referring Provider (PT): Deveshwar   Encounter Date: 05/01/2019  PT End of Session - 05/01/19 2202    Visit Number  21    Date for PT Re-Evaluation  05/31/19    PT Start Time  1705    PT Stop Time  1750    PT Time Calculation (min)  45 min    Activity Tolerance  Patient tolerated treatment well    Behavior During Therapy  [facility] for tasks assessed/performed       Past Medical History:  Diagnosis Date  . GERD (gastroesophageal reflux disease)   . History of radiation therapy 01/16/19- 02/13/19   left breast 15 fractions of 2.67 Gy for a total of 40.05 Gy. Left breast boost 5 fractions of 2 Gy for a total of 10 Gy  . Hypertension   . Mixed hyperlipidemia   . OA (osteoarthritis)    knee right  . Peripheral neuropathy   . PVD (peripheral vascular disease) ([place])   . Type 2 diabetes mellitus treated with insulin [facility])    endocrinologist--- dr Providence Crosby [place]    Past Surgical History:  Procedure Laterality Date  . BREAST BIOPSY Bilateral 2000   benign  . BREAST LUMPECTOMY WITH RADIOACTIVE SEED AND SENTINEL LYMPH NODE BIOPSY Left 08/15/2018   Procedure: LEFT BREAST LUMPECTOMY WITH RADIOACTIVE SEED AND LEFT AXILLARY DEEP SENTINEL LYMPH NODE BIOPSY INJECT BLUE DYE LEFT BREAST;  Surgeon: Fanny Skates, MD;  Location: [place];  Service: General;  Laterality: Left;  . PORTACATH PLACEMENT Right 08/15/2018   Procedure: INSERTION PORT-A-CATH WITH ULTRASOUND;  Surgeon: Fanny Skates, MD;  Location: [place];  Service: General;  Laterality: Right;  . SHOULDER ARTHROSCOPY Right 06-16-2002   dr graves  . SHOULDER OPEN ROTATOR CUFF REPAIR Right 07-28-2000    dr Berenice Primas  . VAGINAL HYSTERECTOMY   1992   with BSO  . WRIST SURGERY  2007    There were no vitals filed for this visit.  Subjective Assessment - 05/01/19 1705    Subjective  Patient reports that she felt like the DN helped    Currently in Pain?  Yes    Pain Score  4     Pain Location  Hip    Pain Orientation  Right                       [facility] Adult PT Treatment/Exercise - 05/01/19 0001      Ambulation/Gait   Gait Comments  around building 1 lap, less short of breath but still very difficult going up the hill      Lumbar Exercises: Machines for Strengthening   Cybex Knee Extension  5lb 2x10     Cybex Knee Flexion  20# 2x10    Leg Press  20# 2x10    Other Lumbar Machine Exercise  seated row, lats 20# 2x10      Moist Heat Therapy   Number Minutes Moist Heat  15 Minutes    Moist Heat Location  Lumbar Spine      Electrical Stimulation   Electrical Stimulation Location  L/S area     Electrical Stimulation Action  IFC    Electrical Stimulation Parameters  sidelying     Electrical Stimulation Goals  Pain      Manual Therapy   Manual Therapy  Soft tissue mobilization    Soft tissue mobilization  lumbar paraspinals               PT Short Term Goals - 02/07/19 1655      PT SHORT TERM GOAL #1   Title  independent with HEP    Status  Achieved        PT Long Term Goals - 05/01/19 1755      PT LONG TERM GOAL #1   Title  understand posture and body mechanics    Status  Achieved      PT LONG TERM GOAL #2   Title  increase lumbar ROM 25%    Status  Achieved      PT LONG TERM GOAL #3   Title  report sleeping better by 50%    Status  Partially Met      PT LONG TERM GOAL #4   Title  cook a meal without pain    Status  Partially Met            Plan - 05/01/19 1753    Clinical Impression Statement  Patient did really well today with the walk around the building able to maintain a good pace, still very short of breath coming up the hill but not as bad as 2 weeks ago, she is  c/o less hip pain and now more in the back, she is very tight and tender here with a small spasms and trigger point in the right lumbar area    PT Frequency  2x / week    PT Duration  4 weeks    PT Treatment/Interventions  ADLs/Self Care Home Management;Iontophoresis 74m/ml Dexamethasone;Moist Heat;Ultrasound;Cryotherapy;Therapeutic activities;Therapeutic exercise;Balance training;Neuromuscular re-education;Manual techniques;Dry needling;Patient/family education;Traction    PT Next Visit Plan  we are going to try to decrease to 1x/week and see how she does    Consulted and Agree with Plan of Care  Patient;Family member/caregiver    Family Member Consulted  daughter       Patient will benefit from skilled therapeutic intervention in order to improve the following deficits and impairments:  Abnormal gait, Improper body mechanics, Pain, Postural dysfunction, Increased muscle spasms, Decreased mobility, Decreased activity tolerance, Decreased endurance, Decreased range of motion, Decreased strength, Impaired flexibility, Difficulty walking  Visit Diagnosis: Acute right-sided low back pain without sciatica  Pain in right hip  Difficulty in walking, not elsewhere classified     Problem List Patient Active Problem List   Diagnosis Date Noted  . Port-A-Cath in place 09/19/2018  . Malignant neoplasm of upper-inner quadrant of left breast in female, [place] receptor positive [place] 07/27/2018  . Dyspnea on exertion 10/21/2016  . Type 2 diabetes mellitus without complication, without long-term current use of insulin ([place] 10/21/2016  . Dyslipidemia 10/21/2016    ASumner Boast, PT 05/01/2019, 5:56 PM  [address] [address]Phone: [phone]  Fax:  [phone] Name: Gloria EhmannMRN: [id number] of Birth: 623-Jul-1947

## 2019-05-03 ENCOUNTER — Encounter: Payer: Medicare Other | Admitting: Physical Therapy

## 2019-05-08 ENCOUNTER — Ambulatory Visit: Payer: Medicare Other | Admitting: Physical Therapy

## 2019-05-10 ENCOUNTER — Other Ambulatory Visit: Payer: Self-pay

## 2019-05-10 ENCOUNTER — Ambulatory Visit: Payer: Medicare Other | Admitting: Physical Therapy

## 2019-05-10 ENCOUNTER — Encounter: Payer: Self-pay | Admitting: Physical Therapy

## 2019-05-10 DIAGNOSIS — M545 Low back pain, unspecified: Secondary | ICD-10-CM

## 2019-05-10 DIAGNOSIS — M25551 Pain in right hip: Secondary | ICD-10-CM | POA: Diagnosis not present

## 2019-05-10 DIAGNOSIS — R262 Difficulty in walking, not elsewhere classified: Secondary | ICD-10-CM

## 2019-05-10 NOTE — Therapy (Signed)
[place], [place], [address] Phone: [phone]   Fax:  [phone]  Physical Therapy Treatment  Patient Details  Name: Gloria Mckinney MRN: [id number] Date of Birth: 09-21-1945 Referring Provider (PT): Deveshwar   Encounter Date: 05/10/2019  PT End of Session - 05/10/19 1741    Visit Number  22    Date for PT Re-Evaluation  05/31/19    PT Start Time  6767    PT Stop Time  1751    PT Time Calculation (min)  46 min    Activity Tolerance  Patient tolerated treatment well       Past Medical History:  Diagnosis Date  . GERD (gastroesophageal reflux disease)   . History of radiation therapy 01/16/19- 02/13/19   left breast 15 fractions of 2.67 Gy for a total of 40.05 Gy. Left breast boost 5 fractions of 2 Gy for a total of 10 Gy  . Hypertension   . Mixed hyperlipidemia   . OA (osteoarthritis)    knee right  . Peripheral neuropathy   . PVD (peripheral vascular disease) ([place])   . Type 2 diabetes mellitus treated with insulin [facility])    endocrinologist--- dr Providence Crosby [place]    Past Surgical History:  Procedure Laterality Date  . BREAST BIOPSY Bilateral 2000   benign  . BREAST LUMPECTOMY WITH RADIOACTIVE SEED AND SENTINEL LYMPH NODE BIOPSY Left 08/15/2018   Procedure: LEFT BREAST LUMPECTOMY WITH RADIOACTIVE SEED AND LEFT AXILLARY DEEP SENTINEL LYMPH NODE BIOPSY INJECT BLUE DYE LEFT BREAST;  Surgeon: Fanny Skates, MD;  Location: [place];  Service: General;  Laterality: Left;  . PORTACATH PLACEMENT Right 08/15/2018   Procedure: INSERTION PORT-A-CATH WITH ULTRASOUND;  Surgeon: Fanny Skates, MD;  Location: [place];  Service: General;  Laterality: Right;  . SHOULDER ARTHROSCOPY Right 06-16-2002   dr graves  . SHOULDER OPEN ROTATOR CUFF REPAIR Right 07-28-2000    dr Berenice Primas  . VAGINAL HYSTERECTOMY  1992   with BSO  . WRIST SURGERY  2007    There were no  vitals filed for this visit.  Subjective Assessment - 05/10/19 1739    Subjective  We have decreased to 1x/week, she thinks that the DN really has helped, she is walking more and doing well with less SOB.  She does report knot in the right low back and a little more pain sitting now    Currently in Pain?  Yes    Pain Score  4     Pain Location  Back    Pain Orientation  Right;Lower    Aggravating Factors   sitting                       [facility] Adult PT Treatment/Exercise - 05/10/19 0001      Electrical Stimulation   Electrical Stimulation Location  right lumbar area    Electrical Stimulation Action  IFC    Electrical Stimulation Parameters  sidelying    Electrical Stimulation Goals  Pain      Manual Therapy   Manual Therapy  Soft tissue mobilization    Soft tissue mobilization  right lumbar area and inot the buttock, MFR, some vibration       Trigger Point Dry Needling - 05/10/19 0001    Consent Given?  Yes    Muscles Treated Back/Hip  Gluteus medius;Gluteus maximus;Piriformis    Gluteus Medius Response  Palpable increased muscle length  Gluteus Maximus Response  Palpable increased muscle length    Piriformis Response  Palpable increased muscle length             PT Short Term Goals - 02/07/19 1655      PT SHORT TERM GOAL #1   Title  independent with HEP    Status  Achieved        PT Long Term Goals - 05/10/19 1743      PT LONG TERM GOAL #3   Title  report sleeping better by 50%    Status  Achieved      PT LONG TERM GOAL #4   Title  cook a meal without pain    Status  Partially Met            Plan - 05/10/19 1741    Clinical Impression Statement  Patient doing better, has been able to walk .75 miles without much pain, when she first started she had difficulty coming down our hall due to pain with walking, she did report some pain with sitting today, she does have some small tender knots in the iliac area and int he lumbar area    PT  Next Visit Plan  will continue on with 1x/week and she is trying to be consistent with her walking    Consulted and Agree with Plan of Care  Patient;Family member/caregiver    Family Member Consulted  daughter       Patient will benefit from skilled therapeutic intervention in order to improve the following deficits and impairments:  Abnormal gait, Improper body mechanics, Pain, Postural dysfunction, Increased muscle spasms, Decreased mobility, Decreased activity tolerance, Decreased endurance, Decreased range of motion, Decreased strength, Impaired flexibility, Difficulty walking  Visit Diagnosis: Acute right-sided low back pain without sciatica  Pain in right hip  Difficulty in walking, not elsewhere classified     Problem List Patient Active Problem List   Diagnosis Date Noted  . Port-A-Cath in place 09/19/2018  . Malignant neoplasm of upper-inner quadrant of left breast in female, estrogen receptor positive ([place]) 07/27/2018  . Dyspnea on exertion 10/21/2016  . Type 2 diabetes mellitus without complication, without long-term current use of insulin ([place]) 10/21/2016  . Dyslipidemia 10/21/2016    Sumner Boast., PT 05/10/2019, 5:44 PM  [place] [address] [place], [place], [address] Phone: [phone]   Fax:  [phone]  Name: Jiya Kissinger MRN: [id number] Date of Birth: [birth year]

## 2019-05-14 NOTE — Progress Notes (Signed)
Patient Care Team: [name], MD as PCP - General (Family Medicine) Nicholas Lose, MD as Consulting Physician (Hematology and Oncology) Delice Bison, Charlestine Massed, NP as Nurse Practitioner (Hematology and Oncology) Fanny Skates, MD as Consulting Physician (General Surgery)  DIAGNOSIS:    ICD-10-CM   1. Malignant neoplasm of upper-inner quadrant of left breast in female, estrogen receptor positive ([place])  C50.212    Z17.0     SUMMARY OF ONCOLOGIC HISTORY: Oncology History  Malignant neoplasm of upper-inner quadrant of left breast in female, estrogen receptor positive ([place])  07/18/2018 Initial Diagnosis   Screening detected left breast mass at 10 o'clock position 1.8 cm axilla negative, biopsy revealed grade 3 IDC ER 80%, PR 70%, Ki-67 40%, HER-2 +3+ by IHC with heterogeneity, T1c N0 stage Ia clinical stage   08/15/2018 Surgery   Left Lumpectomy: IDC grade 3, 1.9 cm, Posterior margin positive (Muscle) due to a transected satellite nodule 0.5 cm, 0/3 LN Neg, ER 80%, PR 70%, Ki-67 40%, HER-2 +3+ by IHC with heterogeneity T1cN0 Stage 1A   08/24/2018 Cancer Staging   Staging form: Breast, AJCC 8th Edition - Pathologic: Stage IA (pT1b, pN0, cM0, G3, ER+, PR+, HER2+) - Signed by Gardenia Phlegm, NP on 08/24/2018   09/09/2018 -  Chemotherapy   The patient had trastuzumab (HERCEPTIN) 300 mg in sodium chloride 0.9 % 250 mL chemo infusion, 294 mg, Intravenous,  Once, 8 of 8 cycles Administration: 300 mg (09/09/2018), 150 mg (09/19/2018), 150 mg (10/10/2018), 450 mg (12/19/2018), 450 mg (01/09/2019), 450 mg (03/13/2019), 150 mg (09/26/2018), 150 mg (10/03/2018), 150 mg (10/17/2018), 150 mg (10/24/2018), 150 mg (10/31/2018), 150 mg (11/07/2018), 150 mg (11/14/2018), 150 mg (11/21/2018), 450 mg (11/28/2018), 450 mg (01/30/2019), 450 mg (02/20/2019) PACLitaxel (TAXOL) 150 mg in sodium chloride 0.9 % 250 mL chemo infusion (</= 27m/m2), 80 mg/m2 = 150 mg, Intravenous,  Once, 3 of 3 cycles Dose modification: 60  mg/m2 (original dose 80 mg/m2, Cycle 2, Reason: Dose not tolerated) Administration: 150 mg (09/09/2018), 150 mg (09/19/2018), 150 mg (10/10/2018), 150 mg (09/26/2018), 150 mg (10/03/2018), 114 mg (10/17/2018), 114 mg (10/24/2018), 114 mg (10/31/2018), 114 mg (11/07/2018), 114 mg (11/14/2018), 114 mg (11/21/2018), 114 mg (11/28/2018) trastuzumab-dkst (OGIVRI) 450 mg in sodium chloride 0.9 % 250 mL chemo infusion, 450 mg (100 % of original dose 450 mg), Intravenous,  Once, 2 of 7 cycles Dose modification: 450 mg (original dose 450 mg, Cycle 9, Reason: Other (see comments), Comment: Biosimilar Conversion) Administration: 450 mg (04/03/2019), 450 mg (04/24/2019)  for chemotherapy treatment.    01/17/2019 - 02/13/2019 Radiation Therapy   Adjuvant XRT     CHIEF COMPLIANT: Follow-up of Herceptin maintenance  INTERVAL HISTORY: Tamanika Rameshchandra PChokshiis a 73y.o. with above-mentioned history of left breast cancer treated with lumpectomy,adjuvant chemotherapy, radiation, and whois currently on Herceptin maintenance. She presents to the clinic todayfor treatment.   REVIEW OF SYSTEMS:   Constitutional: Denies fevers, chills or abnormal weight loss Eyes: Denies blurriness of vision Ears, nose, mouth, throat, and face: Denies mucositis or sore throat Respiratory: Denies cough, dyspnea or wheezes Cardiovascular: Denies palpitation, chest discomfort Gastrointestinal: Denies nausea, heartburn or change in bowel habits Skin: Denies abnormal skin rashes Lymphatics: Denies new lymphadenopathy or easy bruising Neurological: Denies numbness, tingling or new weaknesses Behavioral/Psych: Mood is stable, no new changes  Extremities: No lower extremity edema Breast: denies any pain or lumps or nodules in either breasts All other systems were reviewed with the patient and are negative.  I have reviewed  the past medical history, past surgical history, social history and family history with the patient and they are unchanged  from previous note.  ALLERGIES:  is allergic to penicillins; cephalexin; and statins.  MEDICATIONS:  Current Outpatient Medications  Medication Sig Dispense Refill   aspirin EC 81 MG tablet Take 81 mg by mouth daily.     Calcium Carb-Cholecalciferol (CALCIUM + VITAMIN D3 PO) Take 1 tablet by mouth daily.     Empagliflozin-metFORMIN HCl (SYNJARDY) 12.12-998 MG TABS Take 1 tablet by mouth 2 (two) times daily.     EXFORGE 5-320 MG tablet Take 1 tablet by mouth daily.     ferrous sulfate 325 (65 FE) MG tablet Take 325 mg by mouth daily with breakfast.     hydrochlorothiazide (HYDRODIURIL) 25 MG tablet Take 25 mg by mouth daily.     HYDROcodone-acetaminophen (NORCO) 5-325 MG tablet Take 1-2 tablets by mouth every 6 (six) hours as needed for moderate pain or severe pain. (Patient not taking: Reported on 12/28/2018) 20 tablet 0   Insulin Glargine, 1 Unit Dial, (TOUJEO SOLOSTAR) 300 UNIT/ML SOPN Inject 50 Units into the skin every evening.     lidocaine-prilocaine (EMLA) cream Apply to affected area once 30 g 3   magic mouthwash w/lidocaine SOLN Take 5 mLs by mouth 3 (three) times daily as needed for mouth pain. Recipe: 1 part lidocaine, 1 part Maalox, 1 part Diphenhydramine. (Patient not taking: Reported on 01/05/2019) 100 mL 0   meloxicam (MOBIC) 15 MG tablet      mirtazapine (REMERON) 7.5 MG tablet Take 1 tablet (7.5 mg total) by mouth at bedtime. (Patient not taking: Reported on 01/05/2019) 30 tablet 0   omeprazole (PRILOSEC) 40 MG capsule Take 40 mg by mouth every morning.      ondansetron (ZOFRAN) 8 MG tablet Take 1 tablet (8 mg total) by mouth 2 (two) times daily as needed (Nausea or vomiting). (Patient not taking: Reported on 12/28/2018) 30 tablet 1   prochlorperazine (COMPAZINE) 10 MG tablet Take 1 tablet (10 mg total) by mouth every 6 (six) hours as needed (Nausea or vomiting). (Patient not taking: Reported on 12/28/2018) 30 tablet 1   simvastatin (ZOCOR) 40 MG tablet Take 40 mg by  mouth daily.     traMADol (ULTRAM) 50 MG tablet as needed.      No current facility-administered medications for this visit.    Facility-Administered Medications Ordered in Other Visits  Medication Dose Route Frequency Provider Last Rate Last Dose   sodium chloride flush (NS) 0.9 % injection 10 mL  10 mL Intravenous PRN Nicholas Lose, MD   10 mL at 09/09/18 1114    PHYSICAL EXAMINATION: ECOG PERFORMANCE STATUS: 0 - Asymptomatic  Vitals:   05/15/19 1122  BP: (!) 158/85  Pulse: 77  Resp: 17  Temp: 98.3 F (36.8 C)  SpO2: 100%   Filed Weights   05/15/19 1122  Weight: 164 lb 14.4 oz (74.8 kg)    GENERAL: alert, no distress and comfortable SKIN: skin color, texture, turgor are normal, no rashes or significant lesions EYES: normal, Conjunctiva are pink and non-injected, sclera clear OROPHARYNX: no exudate, no erythema and lips, buccal mucosa, and tongue normal  NECK: supple, thyroid normal size, non-tender, without nodularity LYMPH: no palpable lymphadenopathy in the cervical, axillary or inguinal LUNGS: clear to auscultation and percussion with normal breathing effort HEART: regular rate & rhythm and no murmurs and no lower extremity edema ABDOMEN: abdomen soft, non-tender and normal bowel sounds MUSCULOSKELETAL: no cyanosis of digits  and no clubbing  NEURO: alert & oriented x 3 with fluent speech, no focal motor/sensory deficits EXTREMITIES: No lower extremity edema  LABORATORY DATA:  I have reviewed the data as listed CMP Latest Ref Rng & Units 05/15/2019 04/03/2019 02/20/2019  Glucose 70 - 99 mg/dL 149(H) 187(H) 196(H)  BUN 8 - 23 mg/dL _0 Creatinine 0.44 - 1.00 mg/dL 0.89 0.95 0.90  Sodium 135 - 145 mmol/L 136 135 136  Potassium 3.5 - 5.1 mmol/L 4.1 4.4 4.6  Chloride 98 - 111 mmol/L 103 102 105  CO2 22 - 32 mmol/L 23 22 21(L)  Calcium 8.9 - 10.3 mg/dL 9.8 9.4 9.8  Total Protein 6.5 - 8.1 g/dL 7.2 7.1 7.3  Total Bilirubin 0.3 - 1.2 mg/dL 0.4 0.3 0.2(L)    Alkaline Phos 38 - 126 U/L 90 97 104  AST 15 - 41 U/L 16 16 14(L)  ALT 0 - 44 U/L _1 Lab Results  Component Value Date   WBC 6.0 05/15/2019   HGB 11.9 (L) 05/15/2019   HCT 37.1 05/15/2019   MCV 78.4 (L) 05/15/2019   PLT 234 05/15/2019   NEUTROABS 3.7 05/15/2019    ASSESSMENT & PLAN:  Malignant neoplasm of upper-inner quadrant of left breast in female, estrogen receptor positive ([facility]) 08/15/18:Left Lumpectomy: IDC grade 3, 1.9 cm, Posterior margin positive (Muscle) due to a transected satellite nodule 0.5 cm, 0/3 LN Neg, ER 80%, PR 70%, Ki-67 40%, HER-2 +3+ by IHC with heterogeneity T1cN0 Stage 1A  Posterior Margin: based on discussions with surgery, it is muscle and hence no further surgery is possible.  Plan:  1.Adjuvant Taxol Herceptin weekly x12completed 4/20/2020followed by Herceptin maintenance for 1 year to be completed January 2020 2.Adjuvant radiation therapycompleted 02/13/2019 3.Followed by adjuvant antiestrogen therapy --------------------------------------------------------------------------------------------------------------------------------------------------------- Current treatment: Adjuvant Herceptin to be completed December 2020 Adjuvant radiation completed 02/13/2019 Echocardiogram 11/29/2018: EF 55 to 60%  Iron deficiency anemia: First dose of IV iron given 04/24/2019   Plan is to continue Herceptin every3weeks and follow-up with me every 6 weeks.    No orders of the defined types were placed in this encounter.  The patient has a good understanding of the overall plan. she agrees with it. she will call with any problems that may develop before the next visit here.  Nicholas Lose, MD 05/15/2019  Julious Oka Dorshimer am acting as scribe for Dr. Nicholas Lose.  I have reviewed the above documentation for accuracy and completeness, and I agree with the above.

## 2019-05-15 ENCOUNTER — Inpatient Hospital Stay: Payer: Medicare Other

## 2019-05-15 ENCOUNTER — Other Ambulatory Visit: Payer: Self-pay | Admitting: *Deleted

## 2019-05-15 ENCOUNTER — Other Ambulatory Visit: Payer: Self-pay

## 2019-05-15 ENCOUNTER — Other Ambulatory Visit: Payer: Federal, State, Local not specified - PPO

## 2019-05-15 ENCOUNTER — Ambulatory Visit: Payer: Federal, State, Local not specified - PPO

## 2019-05-15 ENCOUNTER — Ambulatory Visit: Payer: Federal, State, Local not specified - PPO | Admitting: Hematology and Oncology

## 2019-05-15 ENCOUNTER — Other Ambulatory Visit: Payer: Self-pay | Admitting: General Surgery

## 2019-05-15 ENCOUNTER — Encounter: Payer: Self-pay | Admitting: *Deleted

## 2019-05-15 ENCOUNTER — Inpatient Hospital Stay (HOSPITAL_BASED_OUTPATIENT_CLINIC_OR_DEPARTMENT_OTHER): Payer: Medicare Other | Admitting: Hematology and Oncology

## 2019-05-15 ENCOUNTER — Inpatient Hospital Stay: Payer: Medicare Other | Attending: Hematology and Oncology

## 2019-05-15 VITALS — BP 151/79 | HR 82 | Resp 18

## 2019-05-15 DIAGNOSIS — Z170 Estrogen receptor positive status [ER+]: Secondary | ICD-10-CM

## 2019-05-15 DIAGNOSIS — C50212 Malignant neoplasm of upper-inner quadrant of left female breast: Secondary | ICD-10-CM

## 2019-05-15 DIAGNOSIS — Z794 Long term (current) use of insulin: Secondary | ICD-10-CM | POA: Insufficient documentation

## 2019-05-15 DIAGNOSIS — Z7982 Long term (current) use of aspirin: Secondary | ICD-10-CM | POA: Diagnosis not present

## 2019-05-15 DIAGNOSIS — Z79899 Other long term (current) drug therapy: Secondary | ICD-10-CM | POA: Diagnosis not present

## 2019-05-15 DIAGNOSIS — D509 Iron deficiency anemia, unspecified: Secondary | ICD-10-CM | POA: Diagnosis not present

## 2019-05-15 DIAGNOSIS — Z9221 Personal history of antineoplastic chemotherapy: Secondary | ICD-10-CM | POA: Insufficient documentation

## 2019-05-15 DIAGNOSIS — Z5112 Encounter for antineoplastic immunotherapy: Secondary | ICD-10-CM | POA: Insufficient documentation

## 2019-05-15 DIAGNOSIS — Z95828 Presence of other vascular implants and grafts: Secondary | ICD-10-CM

## 2019-05-15 DIAGNOSIS — Z923 Personal history of irradiation: Secondary | ICD-10-CM | POA: Insufficient documentation

## 2019-05-15 LAB — CBC WITH DIFFERENTIAL (CANCER CENTER ONLY)
Abs Immature Granulocytes: 0.03 10*3/uL (ref 0.00–0.07)
Basophils Absolute: 0 10*3/uL (ref 0.0–0.1)
Basophils Relative: 0 %
Eosinophils Absolute: 0.1 10*3/uL (ref 0.0–0.5)
Eosinophils Relative: 1 %
HCT: 37.1 % (ref 36.0–46.0)
Hemoglobin: 11.9 g/dL — ABNORMAL LOW (ref 12.0–15.0)
Immature Granulocytes: 1 %
Lymphocytes Relative: 26 %
Lymphs Abs: 1.6 10*3/uL (ref 0.7–4.0)
MCH: 25.2 pg — ABNORMAL LOW (ref 26.0–34.0)
MCHC: 32.1 g/dL (ref 30.0–36.0)
MCV: 78.4 fL — ABNORMAL LOW (ref 80.0–100.0)
Monocytes Absolute: 0.6 10*3/uL (ref 0.1–1.0)
Monocytes Relative: 9 %
Neutro Abs: 3.7 10*3/uL (ref 1.7–7.7)
Neutrophils Relative %: 63 %
Platelet Count: 234 10*3/uL (ref 150–400)
RBC: 4.73 MIL/uL (ref 3.87–5.11)
RDW: 19.6 % — ABNORMAL HIGH (ref 11.5–15.5)
WBC Count: 6 10*3/uL (ref 4.0–10.5)
nRBC: 0 % (ref 0.0–0.2)

## 2019-05-15 LAB — CMP (CANCER CENTER ONLY)
ALT: 20 U/L (ref 0–44)
AST: 16 U/L (ref 15–41)
Albumin: 3.9 g/dL (ref 3.5–5.0)
Alkaline Phosphatase: 90 U/L (ref 38–126)
Anion gap: 10 (ref 5–15)
BUN: 17 mg/dL (ref 8–23)
CO2: 23 mmol/L (ref 22–32)
Calcium: 9.8 mg/dL (ref 8.9–10.3)
Chloride: 103 mmol/L (ref 98–111)
Creatinine: 0.89 mg/dL (ref 0.44–1.00)
GFR, Est AFR Am: >60 mL/min (ref >60)
GFR, Estimated: >60 mL/min (ref >60)
Glucose, Bld: 149 mg/dL — ABNORMAL HIGH (ref 70–99)
Potassium: 4.1 mmol/L (ref 3.5–5.1)
Sodium: 136 mmol/L (ref 135–145)
Total Bilirubin: 0.4 mg/dL (ref 0.3–1.2)
Total Protein: 7.2 g/dL (ref 6.5–8.1)

## 2019-05-15 MED ORDER — SODIUM CHLORIDE 0.9 % IV SOLN
Freq: Once | INTRAVENOUS | Status: AC
  Administered 2019-05-15: 12:00:00 via INTRAVENOUS
  Filled 2019-05-15: qty 250

## 2019-05-15 MED ORDER — SODIUM CHLORIDE 0.9% FLUSH
10.0000 mL | INTRAVENOUS | Status: DC | PRN
  Administered 2019-05-15: 14:00:00 10 mL
  Filled 2019-05-15: qty 10

## 2019-05-15 MED ORDER — SODIUM CHLORIDE 0.9 % IV SOLN
510.0000 mg | Freq: Once | INTRAVENOUS | Status: AC
  Administered 2019-05-15: 510 mg via INTRAVENOUS
  Filled 2019-05-15: qty 17

## 2019-05-15 MED ORDER — HEPARIN SOD (PORK) LOCK FLUSH 100 UNIT/ML IV SOLN
500.0000 [IU] | Freq: Once | INTRAVENOUS | Status: AC | PRN
  Administered 2019-05-15: 500 [IU]
  Filled 2019-05-15: qty 5

## 2019-05-15 MED ORDER — SODIUM CHLORIDE 0.9% FLUSH
10.0000 mL | Freq: Once | INTRAVENOUS | Status: AC
  Administered 2019-05-15: 10 mL
  Filled 2019-05-15: qty 10

## 2019-05-15 MED ORDER — TRASTUZUMAB-DKST CHEMO 150 MG IV SOLR
450.0000 mg | Freq: Once | INTRAVENOUS | Status: AC
  Administered 2019-05-15: 13:00:00 450 mg via INTRAVENOUS
  Filled 2019-05-15: qty 21.43

## 2019-05-15 MED ORDER — DIPHENHYDRAMINE HCL 25 MG PO CAPS
50.0000 mg | ORAL_CAPSULE | Freq: Once | ORAL | Status: AC
  Administered 2019-05-15: 50 mg via ORAL

## 2019-05-15 MED ORDER — DIPHENHYDRAMINE HCL 25 MG PO CAPS
ORAL_CAPSULE | ORAL | Status: AC
  Filled 2019-05-15: qty 2

## 2019-05-15 MED ORDER — ACETAMINOPHEN 325 MG PO TABS
650.0000 mg | ORAL_TABLET | Freq: Once | ORAL | Status: AC
  Administered 2019-05-15: 650 mg via ORAL

## 2019-05-15 MED ORDER — SODIUM CHLORIDE 0.9 % IV SOLN
Freq: Once | INTRAVENOUS | Status: DC
  Filled 2019-05-15: qty 250

## 2019-05-15 MED ORDER — ACETAMINOPHEN 325 MG PO TABS
ORAL_TABLET | ORAL | Status: AC
  Filled 2019-05-15: qty 2

## 2019-05-15 NOTE — Assessment & Plan Note (Signed)
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  Plan:  1.Adjuvant Taxol Herceptin weekly x12completed 4/20/2020followed by Herceptin maintenance for 1 year to be completed January 2020 2.Adjuvant radiation therapycompleted 02/13/2019 3.Followed by adjuvant antiestrogen therapy --------------------------------------------------------------------------------------------------------------------------------------------------------- Current treatment: Adjuvant Herceptin to be completed December 2020 Adjuvant radiation completed 02/13/2019 Echocardiogram 11/29/2018: EF 55 to 60%  Iron deficiency anemia: First dose of IV iron given 04/24/2019   Plan is to continue Herceptin every3weeks and follow-up with me every 6 weeks.

## 2019-05-15 NOTE — Patient Instructions (Signed)
[facility] Discharge Instructions for Patients Receiving Chemotherapy  Today you received the following chemotherapy agents: Trastuzumab  To help prevent nausea and vomiting after your treatment, we encourage you to take your nausea medication as directed.    If you develop nausea and vomiting that is not controlled by your nausea medication, call the clinic.   BELOW ARE SYMPTOMS THAT SHOULD BE REPORTED IMMEDIATELY:  *FEVER GREATER THAN 100.5 F  *CHILLS WITH OR WITHOUT FEVER  NAUSEA AND VOMITING THAT IS NOT CONTROLLED WITH YOUR NAUSEA MEDICATION  *UNUSUAL SHORTNESS OF BREATH  *UNUSUAL BRUISING OR BLEEDING  TENDERNESS IN MOUTH AND THROAT WITH OR WITHOUT PRESENCE OF ULCERS  *URINARY PROBLEMS  *BOWEL PROBLEMS  UNUSUAL RASH Items with * indicate a potential emergency and should be followed up as soon as possible.  Feel free to call the clinic should you have any questions or concerns. The clinic phone number is [phone].  Please show the [facility] at check-in to the Emergency Department and triage nurse.  Ferumoxytol injection What is this medicine? FERUMOXYTOL is an iron complex. Iron is used to make healthy red blood cells, which carry oxygen and nutrients throughout the body. This medicine is used to treat iron deficiency anemia. This medicine may be used for other purposes; ask your health care provider or pharmacist if you have questions. COMMON BRAND NAME(S): Feraheme What should I tell my health care provider before I take this medicine? They need to know if you have any of these conditions:  anemia not caused by low iron levels  high levels of iron in the blood  magnetic resonance imaging (MRI) test scheduled  an unusual or allergic reaction to iron, other medicines, foods, dyes, or preservatives  pregnant or trying to get pregnant  breast-feeding How should I use this medicine? This medicine is for injection into a vein. It is  given by a health care professional in a hospital or clinic setting. Talk to your pediatrician regarding the use of this medicine in children. Special care may be needed. Overdosage: If you think you have taken too much of this medicine contact a poison control center or emergency room at once. NOTE: This medicine is only for you. Do not share this medicine with others. What if I miss a dose? It is important not to miss your dose. Call your doctor or health care professional if you are unable to keep an appointment. What may interact with this medicine? This medicine may interact with the following medications:  other iron products This list may not describe all possible interactions. Give your health care provider a list of all the medicines, herbs, non-prescription drugs, or dietary supplements you use. Also tell them if you smoke, drink alcohol, or use illegal drugs. Some items may interact with your medicine. What should I watch for while using this medicine? Visit your doctor or healthcare professional regularly. Tell your doctor or healthcare professional if your symptoms do not start to get better or if they get worse. You may need blood work done while you are taking this medicine. You may need to follow a special diet. Talk to your doctor. Foods that contain iron include: whole grains/cereals, dried fruits, beans, or peas, leafy green vegetables, and organ meats (liver, kidney). What side effects may I notice from receiving this medicine? Side effects that you should report to your doctor or health care professional as soon as possible:  allergic reactions like skin rash, itching or hives, swelling  of the face, lips, or tongue  breathing problems  changes in blood pressure  feeling faint or lightheaded, falls  fever or chills  flushing, sweating, or hot feelings  swelling of the ankles or feet Side effects that usually do not require medical attention (report to your doctor or  health care professional if they continue or are bothersome):  diarrhea  headache  nausea, vomiting  stomach pain This list may not describe all possible side effects. Call your doctor for medical advice about side effects. You may report side effects to FDA at 1-800-FDA-1088. Where should I keep my medicine? This drug is given in a hospital or clinic and will not be stored at home. NOTE: This sheet is a summary. It may not cover all possible information. If you have questions about this medicine, talk to your doctor, pharmacist, or health care provider.  2020 Elsevier/Gold Standard (2016-09-14 20:21:10)

## 2019-05-17 ENCOUNTER — Other Ambulatory Visit: Payer: Self-pay

## 2019-05-17 ENCOUNTER — Ambulatory Visit: Payer: Medicare Other | Attending: Rheumatology | Admitting: Physical Therapy

## 2019-05-17 ENCOUNTER — Encounter: Payer: Self-pay | Admitting: Physical Therapy

## 2019-05-17 DIAGNOSIS — M25551 Pain in right hip: Secondary | ICD-10-CM | POA: Diagnosis present

## 2019-05-17 DIAGNOSIS — R262 Difficulty in walking, not elsewhere classified: Secondary | ICD-10-CM | POA: Diagnosis present

## 2019-05-17 DIAGNOSIS — M545 Low back pain, unspecified: Secondary | ICD-10-CM

## 2019-05-17 NOTE — Therapy (Signed)
[facility] [place] [address] [place], [place], [address] Phone: [phone]   Fax:  [phone]  Physical Therapy Treatment  Patient Details  Name: Gloria Mckinney MRN: [id number] Date of Birth: 04/01/1946 Referring Provider (PT): Deveshwar   Encounter Date: 05/17/2019  PT End of Session - 05/17/19 1756    Visit Number  23    Date for PT Re-Evaluation  05/31/19    PT Start Time  1702    PT Stop Time  1747    PT Time Calculation (min)  45 min    Activity Tolerance  Patient tolerated treatment well    Behavior During Therapy  [facility] for tasks assessed/performed       Past Medical History:  Diagnosis Date  . GERD (gastroesophageal reflux disease)   . History of radiation therapy 01/16/19- 02/13/19   left breast 15 fractions of 2.67 Gy for a total of 40.05 Gy. Left breast boost 5 fractions of 2 Gy for a total of 10 Gy  . Hypertension   . Mixed hyperlipidemia   . OA (osteoarthritis)    knee right  . Peripheral neuropathy   . PVD (peripheral vascular disease) ([place])   . Type 2 diabetes mellitus treated with insulin [facility])    endocrinologist--- dr Providence Crosby [place]    Past Surgical History:  Procedure Laterality Date  . BREAST BIOPSY Bilateral 2000   benign  . BREAST LUMPECTOMY WITH RADIOACTIVE SEED AND SENTINEL LYMPH NODE BIOPSY Left 08/15/2018   Procedure: LEFT BREAST LUMPECTOMY WITH RADIOACTIVE SEED AND LEFT AXILLARY DEEP SENTINEL LYMPH NODE BIOPSY INJECT BLUE DYE LEFT BREAST;  Surgeon: Fanny Skates, MD;  Location: [place];  Service: General;  Laterality: Left;  . PORTACATH PLACEMENT Right 08/15/2018   Procedure: INSERTION PORT-A-CATH WITH ULTRASOUND;  Surgeon: Fanny Skates, MD;  Location: [place];  Service: General;  Laterality: Right;  . SHOULDER ARTHROSCOPY Right 06-16-2002   dr graves  . SHOULDER OPEN ROTATOR CUFF REPAIR Right 07-28-2000    dr Berenice Primas  . VAGINAL HYSTERECTOMY   1992   with BSO  . WRIST SURGERY  2007    There were no vitals filed for this visit.  Subjective Assessment - 05/17/19 1705    Subjective  Patient reports that she walked today and did well, just has tightness and spasms in the right low back and the buttock    Currently in Pain?  Yes    Pain Score  3     Pain Location  Back    Pain Orientation  Right;Lower    Aggravating Factors   walking and standing                       [facility] Adult PT Treatment/Exercise - 05/17/19 0001      Moist Heat Therapy   Number Minutes Moist Heat  15 Minutes    Moist Heat Location  Lumbar Spine      Electrical Stimulation   Electrical Stimulation Location  right lumbar area    Electrical Stimulation Action  IFC    Electrical Stimulation Parameters  sidelying    Electrical Stimulation Goals  Pain      Manual Therapy   Manual Therapy  Soft tissue mobilization    Manual therapy comments  passive stretch of the HS, piriformis and ITB    Soft tissue mobilization  right lumbar area and inot the buttock, MFR, some vibration  Trigger Point Dry Needling - 05/17/19 0001    Consent Given?  Yes    Muscles Treated Back/Hip  Gluteus medius;Gluteus maximus;Piriformis    Gluteus Medius Response  Palpable increased muscle length    Gluteus Maximus Response  Palpable increased muscle length    Piriformis Response  Palpable increased muscle length             PT Short Term Goals - 02/07/19 1655      PT SHORT TERM GOAL #1   Title  independent with HEP    Status  Achieved        PT Long Term Goals - 05/17/19 1757      PT LONG TERM GOAL #4   Title  cook a meal without pain    Status  Partially Met      PT LONG TERM GOAL #5   Title  walk 1 mile    Time  4    Period  Weeks    Status  New            Plan - 05/17/19 1756    Clinical Impression Statement  Patient reports that she is getting better, just continues to have the pain and tightness iwht activities, the  tightness is in the lumbar and buttock area on the right, she reports getting good relief with our treatment ans there fore we have been avble to decrease to 1x/week    PT Next Visit Plan  will continue on with 1x/week and she is trying to be consistent with her walking    Consulted and Agree with Plan of Care  Patient       Patient will benefit from skilled therapeutic intervention in order to improve the following deficits and impairments:  Abnormal gait, Improper body mechanics, Pain, Postural dysfunction, Increased muscle spasms, Decreased mobility, Decreased activity tolerance, Decreased endurance, Decreased range of motion, Decreased strength, Impaired flexibility, Difficulty walking  Visit Diagnosis: Acute right-sided low back pain without sciatica  Pain in right hip  Difficulty in walking, not elsewhere classified     Problem List Patient Active Problem List   Diagnosis Date Noted  . Port-A-Cath in place 09/19/2018  . Malignant neoplasm of upper-inner quadrant of left breast in female, estrogen receptor positive ([place]) 07/27/2018  . Dyspnea on exertion 10/21/2016  . Type 2 diabetes mellitus without complication, without long-term current use of insulin ([place]) 10/21/2016  . Dyslipidemia 10/21/2016    Sumner Boast., PT 05/17/2019, 5:58 PM  [facility] [address] [place], [place], [address] Phone: [phone]   Fax:  [phone]  Name: Langley Flatley MRN: [id number] Date of Birth: [birth year]

## 2019-05-24 ENCOUNTER — Encounter: Payer: Self-pay | Admitting: Physical Therapy

## 2019-05-24 ENCOUNTER — Ambulatory Visit: Payer: Medicare Other | Admitting: Physical Therapy

## 2019-05-24 ENCOUNTER — Other Ambulatory Visit: Payer: Self-pay

## 2019-05-24 DIAGNOSIS — M25551 Pain in right hip: Secondary | ICD-10-CM

## 2019-05-24 DIAGNOSIS — M545 Low back pain, unspecified: Secondary | ICD-10-CM

## 2019-05-24 DIAGNOSIS — R262 Difficulty in walking, not elsewhere classified: Secondary | ICD-10-CM

## 2019-05-24 NOTE — Therapy (Signed)
[place], [place], [address] Phone: [phone]   Fax:  [phone]  Physical Therapy Treatment  Patient Details  Name: Gloria Mckinney MRN: [id number] Date of Birth: 1945-11-15 Referring Provider (PT): Deveshwar   Encounter Date: 05/24/2019  PT End of Session - 05/24/19 1806    Visit Number  24    Date for PT Re-Evaluation  05/31/19    PT Start Time  1709    PT Stop Time  1750    PT Time Calculation (min)  41 min    Activity Tolerance  Patient tolerated treatment well    Behavior During Therapy  [facility] for tasks assessed/performed       Past Medical History:  Diagnosis Date  . GERD (gastroesophageal reflux disease)   . History of radiation therapy 01/16/19- 02/13/19   left breast 15 fractions of 2.67 Gy for a total of 40.05 Gy. Left breast boost 5 fractions of 2 Gy for a total of 10 Gy  . Hypertension   . Mixed hyperlipidemia   . OA (osteoarthritis)    knee right  . Peripheral neuropathy   . PVD (peripheral vascular disease) ([place])   . Type 2 diabetes mellitus treated with insulin [facility])    endocrinologist--- dr Providence Crosby [place]    Past Surgical History:  Procedure Laterality Date  . BREAST BIOPSY Bilateral 2000   benign  . BREAST LUMPECTOMY WITH RADIOACTIVE SEED AND SENTINEL LYMPH NODE BIOPSY Left 08/15/2018   Procedure: LEFT BREAST LUMPECTOMY WITH RADIOACTIVE SEED AND LEFT AXILLARY DEEP SENTINEL LYMPH NODE BIOPSY INJECT BLUE DYE LEFT BREAST;  Surgeon: Fanny Skates, MD;  Location: [place];  Service: General;  Laterality: Left;  . PORTACATH PLACEMENT Right 08/15/2018   Procedure: INSERTION PORT-A-CATH WITH ULTRASOUND;  Surgeon: Fanny Skates, MD;  Location: [place];  Service: General;  Laterality: Right;  . SHOULDER ARTHROSCOPY Right 06-16-2002   dr graves  . SHOULDER OPEN ROTATOR CUFF REPAIR Right 07-28-2000    dr Berenice Primas  . VAGINAL HYSTERECTOMY   1992   with BSO  . WRIST SURGERY  2007    There were no vitals filed for this visit.  Subjective Assessment - 05/24/19 1804    Subjective  Patient reports that walking is much better, walking daily sometimes 2x, she asks about stnading    Currently in Pain?  Yes    Pain Score  4     Pain Location  Back    Aggravating Factors   standing                       [facility] Adult PT Treatment/Exercise - 05/24/19 0001      Therapeutic Activites    Therapeutic Activities  ADL's    ADL's  went over prolonges standing , foot prop, weight shift pelvic motions, demo and had her demo      Manual Therapy   Manual Therapy  Soft tissue mobilization    Manual therapy comments  passive stretch of the HS, piriformis and ITB    Soft tissue mobilization  right lumbar area and inot the buttock, MFR, some vibration               PT Short Term Goals - 02/07/19 1655      PT SHORT TERM GOAL #1   Title  independent with HEP    Status  Achieved        PT  Long Term Goals - 05/24/19 1807      PT LONG TERM GOAL #1   Title  understand posture and body mechanics    Status  Achieved            Plan - 05/24/19 1806    Clinical Impression Statement  Patient now with the biggest issue of back pain with standing to brush teeth, wash dishes and cook, she seemed to understand my talk about posture and body mechanics and her granddaughter was here to help with demo.    PT Next Visit Plan  continue to work on her issues as she has them come up    Consulted and Agree with Plan of Care  Patient       Patient will benefit from skilled therapeutic intervention in order to improve the following deficits and impairments:  Abnormal gait, Improper body mechanics, Pain, Postural dysfunction, Increased muscle spasms, Decreased mobility, Decreased activity tolerance, Decreased endurance, Decreased range of motion, Decreased strength, Impaired flexibility, Difficulty walking  Visit  Diagnosis: Acute right-sided low back pain without sciatica  Pain in right hip  Difficulty in walking, not elsewhere classified     Problem List Patient Active Problem List   Diagnosis Date Noted  . Port-A-Cath in place 09/19/2018  . Malignant neoplasm of upper-inner quadrant of left breast in female, estrogen receptor positive ([place]) 07/27/2018  . Dyspnea on exertion 10/21/2016  . Type 2 diabetes mellitus without complication, without long-term current use of insulin ([place]) 10/21/2016  . Dyslipidemia 10/21/2016    Sumner Boast., PT 05/24/2019, 6:08 PM  [facility] [address] [place], [place], [address] Phone: [phone]   Fax:  [phone]  Name: Gloria Mckinney MRN: [id number] Date of Birth: June 21, 1946

## 2019-05-25 ENCOUNTER — Other Ambulatory Visit: Payer: Self-pay

## 2019-05-29 ENCOUNTER — Telehealth: Payer: Self-pay | Admitting: Hematology and Oncology

## 2019-05-29 NOTE — Telephone Encounter (Signed)
Returned patient's phone call regarding rescheduling 12/28 appointments, per 10/15 schedule message appointments have moved to 12/24. Dates approved.

## 2019-05-31 ENCOUNTER — Ambulatory Visit: Payer: Medicare Other | Admitting: Physical Therapy

## 2019-05-31 ENCOUNTER — Other Ambulatory Visit: Payer: Self-pay

## 2019-05-31 ENCOUNTER — Encounter: Payer: Self-pay | Admitting: Physical Therapy

## 2019-05-31 DIAGNOSIS — M25551 Pain in right hip: Secondary | ICD-10-CM

## 2019-05-31 DIAGNOSIS — M545 Low back pain, unspecified: Secondary | ICD-10-CM

## 2019-05-31 DIAGNOSIS — R262 Difficulty in walking, not elsewhere classified: Secondary | ICD-10-CM

## 2019-05-31 NOTE — Therapy (Signed)
[address] [place], [place], [address] Phone: [phone]   Fax:  [phone]  Physical Therapy Treatment  Patient Details  Name: Gloria Mckinney MRN: [id number] Date of Birth: [birth year] Referring Provider (PT): Deveshwar   Encounter Date: 05/31/2019  PT End of Session - 05/31/19 1737    Visit Number  25    Date for PT Re-Evaluation  07/02/19    PT Start Time  1705    PT Stop Time  1750    PT Time Calculation (min)  45 min    Activity Tolerance  Patient tolerated treatment well    Behavior During Therapy  [facility] for tasks assessed/performed       Past Medical History:  Diagnosis Date  . GERD (gastroesophageal reflux disease)   . History of radiation therapy 01/16/19- 02/13/19   left breast 15 fractions of 2.67 Gy for a total of 40.05 Gy. Left breast boost 5 fractions of 2 Gy for a total of 10 Gy  . Hypertension   . Mixed hyperlipidemia   . OA (osteoarthritis)    knee right  . Peripheral neuropathy   . PVD (peripheral vascular disease) ([place])   . Type 2 diabetes mellitus treated with insulin [facility])    endocrinologist--- dr Providence Crosby Kai    Past Surgical History:  Procedure Laterality Date  . BREAST BIOPSY Bilateral 2000   benign  . BREAST LUMPECTOMY WITH RADIOACTIVE SEED AND SENTINEL LYMPH NODE BIOPSY Left 08/15/2018   Procedure: LEFT BREAST LUMPECTOMY WITH RADIOACTIVE SEED AND LEFT AXILLARY DEEP SENTINEL LYMPH NODE BIOPSY INJECT BLUE DYE LEFT BREAST;  Surgeon: Fanny Skates, MD;  Location: [place];  Service: General;  Laterality: Left;  . PORTACATH PLACEMENT Right 08/15/2018   Procedure: INSERTION PORT-A-CATH WITH ULTRASOUND;  Surgeon: Fanny Skates, MD;  Location: [place];  Service: General;  Laterality: Right;  . SHOULDER ARTHROSCOPY Right 06-16-2002   dr graves  . SHOULDER OPEN ROTATOR CUFF REPAIR Right 07-28-2000    dr Berenice Primas  . VAGINAL HYSTERECTOMY   1992   with BSO  . WRIST SURGERY  2007    There were no vitals filed for this visit.  Subjective Assessment - 05/31/19 1735    Subjective  Patient reports that the information last week helped her with cooking and brushing teeth    Currently in Pain?  Yes    Pain Score  3     Pain Location  Back    Pain Orientation  Right;Lower    Aggravating Factors   standing, sitting for long periods and then getting up to walk                       [facility] Adult PT Treatment/Exercise - 05/31/19 0001      Moist Heat Therapy   Number Minutes Moist Heat  15 Minutes    Moist Heat Location  Lumbar Spine      Electrical Stimulation   Electrical Stimulation Location  right lumbar area    Electrical Stimulation Action  IFC    Electrical Stimulation Parameters  sidelying     Electrical Stimulation Goals  Pain      Manual Therapy   Manual Therapy  Soft tissue mobilization    Manual therapy comments  passive stretch of the HS, piriformis and ITB, SK2C, trunk rotation    Soft tissue mobilization  right lumbar area and inot the buttock, MFR, some vibration  PT Short Term Goals - 02/07/19 1655      PT SHORT TERM GOAL #1   Title  independent with HEP    Status  Achieved        PT Long Term Goals - 05/31/19 1739      PT LONG TERM GOAL #4   Title  cook a meal without pain    Status  Partially Met      PT LONG TERM GOAL #5   Title  walk 1 mile    Status  Partially Met            Plan - 05/31/19 1738    Clinical Impression Statement  Patient and daughter tell me that they feel she is about 75% better she continue to c/o issues with standing and with sitting and then trying to get up and walk, they report that she is good at the walking but not doing much stretching.  I reissued the HEP for wms flexion and went over with her    PT Next Visit Plan  we may try to start skipping weeks and see how she does with encouragment of HEP, she will come in next  week and I will give more HEP for her to do    Consulted and Agree with Plan of Care  Patient    Family Member Consulted  daughter       Patient will benefit from skilled therapeutic intervention in order to improve the following deficits and impairments:  Abnormal gait, Improper body mechanics, Pain, Postural dysfunction, Increased muscle spasms, Decreased mobility, Decreased activity tolerance, Decreased endurance, Decreased range of motion, Decreased strength, Impaired flexibility, Difficulty walking  Visit Diagnosis: Acute right-sided low back pain without sciatica - Plan: PT plan of care cert/re-cert  Pain in right hip - Plan: PT plan of care cert/re-cert  Difficulty in walking, not elsewhere classified - Plan: PT plan of care cert/re-cert     Problem List Patient Active Problem List   Diagnosis Date Noted  . Port-A-Cath in place 09/19/2018  . Malignant neoplasm of upper-inner quadrant of left breast in female, estrogen receptor positive ([place]) 07/27/2018  . Dyspnea on exertion 10/21/2016  . Type 2 diabetes mellitus without complication, without long-term current use of insulin ([place]) 10/21/2016  . Dyslipidemia 10/21/2016    Sumner Boast., PT 05/31/2019, 5:41 PM  [address] [place], [place], [address] Phone: [phone]   Fax:  [phone]  Name: Giulianna Rocha MRN: [id number] Date of Birth: 1945/08/16

## 2019-06-05 ENCOUNTER — Encounter: Payer: Self-pay | Admitting: *Deleted

## 2019-06-05 ENCOUNTER — Other Ambulatory Visit: Payer: Self-pay

## 2019-06-05 ENCOUNTER — Inpatient Hospital Stay: Payer: Medicare Other

## 2019-06-05 ENCOUNTER — Ambulatory Visit: Payer: Federal, State, Local not specified - PPO

## 2019-06-05 VITALS — BP 132/76 | HR 81 | Temp 98.3°F | Resp 18

## 2019-06-05 DIAGNOSIS — Z170 Estrogen receptor positive status [ER+]: Secondary | ICD-10-CM

## 2019-06-05 DIAGNOSIS — C50212 Malignant neoplasm of upper-inner quadrant of left female breast: Secondary | ICD-10-CM

## 2019-06-05 DIAGNOSIS — Z5112 Encounter for antineoplastic immunotherapy: Secondary | ICD-10-CM | POA: Diagnosis not present

## 2019-06-05 MED ORDER — DIPHENHYDRAMINE HCL 25 MG PO CAPS
ORAL_CAPSULE | ORAL | Status: AC
  Filled 2019-06-05: qty 2

## 2019-06-05 MED ORDER — TRASTUZUMAB-DKST CHEMO 150 MG IV SOLR
450.0000 mg | Freq: Once | INTRAVENOUS | Status: AC
  Administered 2019-06-05: 13:00:00 450 mg via INTRAVENOUS
  Filled 2019-06-05: qty 21.43

## 2019-06-05 MED ORDER — SODIUM CHLORIDE 0.9% FLUSH
10.0000 mL | INTRAVENOUS | Status: DC | PRN
  Administered 2019-06-05: 10 mL
  Filled 2019-06-05: qty 10

## 2019-06-05 MED ORDER — ACETAMINOPHEN 325 MG PO TABS
ORAL_TABLET | ORAL | Status: AC
  Filled 2019-06-05: qty 2

## 2019-06-05 MED ORDER — HEPARIN SOD (PORK) LOCK FLUSH 100 UNIT/ML IV SOLN
500.0000 [IU] | Freq: Once | INTRAVENOUS | Status: AC | PRN
  Administered 2019-06-05: 500 [IU]
  Filled 2019-06-05: qty 5

## 2019-06-05 MED ORDER — SODIUM CHLORIDE 0.9 % IV SOLN
Freq: Once | INTRAVENOUS | Status: AC
  Administered 2019-06-05: 12:00:00 via INTRAVENOUS
  Filled 2019-06-05: qty 250

## 2019-06-05 MED ORDER — ACETAMINOPHEN 325 MG PO TABS
650.0000 mg | ORAL_TABLET | Freq: Once | ORAL | Status: AC
  Administered 2019-06-05: 650 mg via ORAL

## 2019-06-05 MED ORDER — DIPHENHYDRAMINE HCL 25 MG PO CAPS
50.0000 mg | ORAL_CAPSULE | Freq: Once | ORAL | Status: AC
  Administered 2019-06-05: 50 mg via ORAL

## 2019-06-05 NOTE — Patient Instructions (Signed)
[facility] Discharge Instructions for Patients Receiving Chemotherapy  Today you received the following chemotherapy agents: Trastuzumab  To help prevent nausea and vomiting after your treatment, we encourage you to take your nausea medication as directed.    If you develop nausea and vomiting that is not controlled by your nausea medication, call the clinic.   BELOW ARE SYMPTOMS THAT SHOULD BE REPORTED IMMEDIATELY:  *FEVER GREATER THAN 100.5 F  *CHILLS WITH OR WITHOUT FEVER  NAUSEA AND VOMITING THAT IS NOT CONTROLLED WITH YOUR NAUSEA MEDICATION  *UNUSUAL SHORTNESS OF BREATH  *UNUSUAL BRUISING OR BLEEDING  TENDERNESS IN MOUTH AND THROAT WITH OR WITHOUT PRESENCE OF ULCERS  *URINARY PROBLEMS  *BOWEL PROBLEMS  UNUSUAL RASH Items with * indicate a potential emergency and should be followed up as soon as possible.  Feel free to call the clinic should you have any questions or concerns. The clinic phone number is [phone].  Please show the [facility] at check-in to the Emergency Department and triage nurse.  Ferumoxytol injection What is this medicine? FERUMOXYTOL is an iron complex. Iron is used to make healthy red blood cells, which carry oxygen and nutrients throughout the body. This medicine is used to treat iron deficiency anemia. This medicine may be used for other purposes; ask your health care provider or pharmacist if you have questions. COMMON BRAND NAME(S): Feraheme What should I tell my health care provider before I take this medicine? They need to know if you have any of these conditions:  anemia not caused by low iron levels  high levels of iron in the blood  magnetic resonance imaging (MRI) test scheduled  an unusual or allergic reaction to iron, other medicines, foods, dyes, or preservatives  pregnant or trying to get pregnant  breast-feeding How should I use this medicine? This medicine is for injection into a vein. It is  given by a health care professional in a hospital or clinic setting. Talk to your pediatrician regarding the use of this medicine in children. Special care may be needed. Overdosage: If you think you have taken too much of this medicine contact a poison control center or emergency room at once. NOTE: This medicine is only for you. Do not share this medicine with others. What if I miss a dose? It is important not to miss your dose. Call your doctor or health care professional if you are unable to keep an appointment. What may interact with this medicine? This medicine may interact with the following medications:  other iron products This list may not describe all possible interactions. Give your health care provider a list of all the medicines, herbs, non-prescription drugs, or dietary supplements you use. Also tell them if you smoke, drink alcohol, or use illegal drugs. Some items may interact with your medicine. What should I watch for while using this medicine? Visit your doctor or healthcare professional regularly. Tell your doctor or healthcare professional if your symptoms do not start to get better or if they get worse. You may need blood work done while you are taking this medicine. You may need to follow a special diet. Talk to your doctor. Foods that contain iron include: whole grains/cereals, dried fruits, beans, or peas, leafy green vegetables, and organ meats (liver, kidney). What side effects may I notice from receiving this medicine? Side effects that you should report to your doctor or health care professional as soon as possible:  allergic reactions like skin rash, itching or hives, swelling  of the face, lips, or tongue  breathing problems  changes in blood pressure  feeling faint or lightheaded, falls  fever or chills  flushing, sweating, or hot feelings  swelling of the ankles or feet Side effects that usually do not require medical attention (report to your doctor or  health care professional if they continue or are bothersome):  diarrhea  headache  nausea, vomiting  stomach pain This list may not describe all possible side effects. Call your doctor for medical advice about side effects. You may report side effects to FDA at 1-800-FDA-1088. Where should I keep my medicine? This drug is given in a hospital or clinic and will not be stored at home. NOTE: This sheet is a summary. It may not cover all possible information. If you have questions about this medicine, talk to your doctor, pharmacist, or health care provider.  2020 Elsevier/Gold Standard (2016-09-14 20:21:10)

## 2019-06-07 ENCOUNTER — Ambulatory Visit: Payer: Medicare Other | Admitting: Physical Therapy

## 2019-06-07 ENCOUNTER — Encounter: Payer: Self-pay | Admitting: Physical Therapy

## 2019-06-07 ENCOUNTER — Other Ambulatory Visit: Payer: Self-pay

## 2019-06-07 DIAGNOSIS — M545 Low back pain, unspecified: Secondary | ICD-10-CM

## 2019-06-07 DIAGNOSIS — R262 Difficulty in walking, not elsewhere classified: Secondary | ICD-10-CM

## 2019-06-07 DIAGNOSIS — M25551 Pain in right hip: Secondary | ICD-10-CM

## 2019-06-07 NOTE — Therapy (Signed)
[address] [place], [place], [address] Phone: [phone]   Fax:  [phone]  Physical Therapy Treatment  Patient Details  Name: Gloria Mckinney MRN: [id number] Date of Birth: Dec 27, 1945 Referring Provider (PT): Deveshwar   Encounter Date: 06/07/2019  PT End of Session - 06/07/19 1737    Visit Number  26    Date for PT Re-Evaluation  07/02/19    PT Start Time  1702    PT Stop Time  1752    PT Time Calculation (min)  50 min    Activity Tolerance  Patient tolerated treatment well    Behavior During Therapy  [facility] for tasks assessed/performed       Past Medical History:  Diagnosis Date  . GERD (gastroesophageal reflux disease)   . History of radiation therapy 01/16/19- 02/13/19   left breast 15 fractions of 2.67 Gy for a total of 40.05 Gy. Left breast boost 5 fractions of 2 Gy for a total of 10 Gy  . Hypertension   . Mixed hyperlipidemia   . OA (osteoarthritis)    knee right  . Peripheral neuropathy   . PVD (peripheral vascular disease) ([place])   . Type 2 diabetes mellitus treated with insulin [facility])    endocrinologist--- dr Providence Crosby Steinberg    Past Surgical History:  Procedure Laterality Date  . BREAST BIOPSY Bilateral 2000   benign  . BREAST LUMPECTOMY WITH RADIOACTIVE SEED AND SENTINEL LYMPH NODE BIOPSY Left 08/15/2018   Procedure: LEFT BREAST LUMPECTOMY WITH RADIOACTIVE SEED AND LEFT AXILLARY DEEP SENTINEL LYMPH NODE BIOPSY INJECT BLUE DYE LEFT BREAST;  Surgeon: Fanny Skates, MD;  Location: [place];  Service: General;  Laterality: Left;  . PORTACATH PLACEMENT Right 08/15/2018   Procedure: INSERTION PORT-A-CATH WITH ULTRASOUND;  Surgeon: Fanny Skates, MD;  Location: [place];  Service: General;  Laterality: Right;  . SHOULDER ARTHROSCOPY Right 06-16-2002   dr graves  . SHOULDER OPEN ROTATOR CUFF REPAIR Right 07-28-2000    dr Berenice Primas  . VAGINAL HYSTERECTOMY   1992   with BSO  . WRIST SURGERY  2007    There were no vitals filed for this visit.  Subjective Assessment - 06/07/19 1735    Subjective  Patient reports that she is walking daily, that she is trying to do the stretches, the daughter reports "sometimes:    Currently in Pain?  Yes    Pain Score  4     Pain Location  Hip    Pain Orientation  Right    Aggravating Factors   standing                       [facility] Adult PT Treatment/Exercise - 06/07/19 0001      Lumbar Exercises: Stretches   Passive Hamstring Stretch  4 reps;20 seconds    ITB Stretch  3 reps;20 seconds;Right;Left    Piriformis Stretch  4 reps;20 seconds      Moist Heat Therapy   Number Minutes Moist Heat  10 Minutes    Moist Heat Location  Lumbar Spine      Electrical Stimulation   Electrical Stimulation Location  right lumbar area    Electrical Stimulation Action  IFC    Electrical Stimulation Parameters  sidelying    Electrical Stimulation Goals  Pain      Manual Therapy   Manual Therapy  Soft tissue mobilization    Manual therapy comments  passive stretch of the HS, piriformis and ITB, SK2C, trunk rotation    Soft tissue mobilization  right lumbar area and inot the buttock, MFR, some vibration               PT Short Term Goals - 02/07/19 1655      PT SHORT TERM GOAL #1   Title  independent with HEP    Status  Achieved        PT Long Term Goals - 06/07/19 1742      PT LONG TERM GOAL #1   Title  understand posture and body mechanics    Status  Achieved      PT LONG TERM GOAL #2   Title  increase lumbar ROM 25%    Status  Achieved      PT LONG TERM GOAL #3   Title  report sleeping better by 50%    Status  Achieved      PT LONG TERM GOAL #4   Title  cook a meal without pain    Status  Partially Met      PT LONG TERM GOAL #5   Title  walk 1 mile    Status  Achieved            Plan - 06/07/19 1738    Clinical Impression Statement  Patient did have some c/o  pain in the right lateral thigh, I did some palpation and she was very tight and very sore and tender here, I gave her the ITB stretch and rolled out the ITB, this was not tolerated well, I instructed the daughter in how to do some at home.  Encouraged her to walk and do the foot propping since standing is what causes the most pain    PT Next Visit Plan  hold treatment    Consulted and Agree with Plan of Care  Patient    Family Member Consulted  daughter       Patient will benefit from skilled therapeutic intervention in order to improve the following deficits and impairments:  Abnormal gait, Improper body mechanics, Pain, Postural dysfunction, Increased muscle spasms, Decreased mobility, Decreased activity tolerance, Decreased endurance, Decreased range of motion, Decreased strength, Impaired flexibility, Difficulty walking  Visit Diagnosis: Acute right-sided low back pain without sciatica  Pain in right hip  Difficulty in walking, not elsewhere classified     Problem List Patient Active Problem List   Diagnosis Date Noted  . Port-A-Cath in place 09/19/2018  . Malignant neoplasm of upper-inner quadrant of left breast in female, estrogen receptor positive ([place]) 07/27/2018  . Dyspnea on exertion 10/21/2016  . Type 2 diabetes mellitus without complication, without long-term current use of insulin ([place]) 10/21/2016  . Dyslipidemia 10/21/2016    Sumner Boast., PT 06/07/2019, 5:43 PM  [address] [place], [place], [address] Phone: [phone]   Fax:  [phone]  Name: Gloria Mckinney MRN: [id number] Date of Birth: Dec 13, 1945

## 2019-06-07 NOTE — Patient Instructions (Signed)
Access Code: [email or web address]  URL: [email or web address]  Date: 06/07/2019  Prepared by: Lum Babe   Exercises Supine Hamstring Stretch with Strap - 5 reps - 1 sets - 30 hold - 2x daily - 7x weekly Seated Hamstring Stretch with Chair - 5 reps - 1 sets - 30 hold - 2x daily - 7x weekly Supine Piriformis Stretch Pulling Heel to Hip - 5 reps - 1 sets - 20 hold - 2x daily - 7x weekly

## 2019-06-25 NOTE — Progress Notes (Signed)
Patient Care Team: [name], MD as PCP - General (Family Medicine) Nicholas Lose, MD as Consulting Physician (Hematology and Oncology) Delice Bison, Charlestine Massed, NP as Nurse Practitioner (Hematology and Oncology) Fanny Skates, MD as Consulting Physician (General Surgery)  DIAGNOSIS:    ICD-10-CM   1. Malignant neoplasm of upper-inner quadrant of left breast in female, estrogen receptor positive ([place])  C50.212    Z17.0     SUMMARY OF ONCOLOGIC HISTORY: Oncology History  Malignant neoplasm of upper-inner quadrant of left breast in female, estrogen receptor positive ([place])  07/18/2018 Initial Diagnosis   Screening detected left breast mass at 10 o'clock position 1.8 cm axilla negative, biopsy revealed grade 3 IDC ER 80%, PR 70%, Ki-67 40%, HER-2 +3+ by IHC with heterogeneity, T1c N0 stage Ia clinical stage   08/15/2018 Surgery   Left Lumpectomy: IDC grade 3, 1.9 cm, Posterior margin positive (Muscle) due to a transected satellite nodule 0.5 cm, 0/3 LN Neg, ER 80%, PR 70%, Ki-67 40%, HER-2 +3+ by IHC with heterogeneity T1cN0 Stage 1A   08/24/2018 Cancer Staging   Staging form: Breast, AJCC 8th Edition - Pathologic: Stage IA (pT1b, pN0, cM0, G3, ER+, PR+, HER2+) - Signed by Gardenia Phlegm, NP on 08/24/2018   09/09/2018 -  Chemotherapy   The patient had trastuzumab (HERCEPTIN) 300 mg in sodium chloride 0.9 % 250 mL chemo infusion, 294 mg, Intravenous,  Once, 8 of 8 cycles Administration: 300 mg (09/09/2018), 150 mg (09/19/2018), 150 mg (10/10/2018), 450 mg (12/19/2018), 450 mg (01/09/2019), 450 mg (03/13/2019), 150 mg (09/26/2018), 150 mg (10/03/2018), 150 mg (10/17/2018), 150 mg (10/24/2018), 150 mg (10/31/2018), 150 mg (11/07/2018), 150 mg (11/14/2018), 150 mg (11/21/2018), 450 mg (11/28/2018), 450 mg (01/30/2019), 450 mg (02/20/2019) PACLitaxel (TAXOL) 150 mg in sodium chloride 0.9 % 250 mL chemo infusion (</= 11m/m2), 80 mg/m2 = 150 mg, Intravenous,  Once, 3 of 3 cycles Dose modification: 60  mg/m2 (original dose 80 mg/m2, Cycle 2, Reason: Dose not tolerated) Administration: 150 mg (09/09/2018), 150 mg (09/19/2018), 150 mg (10/10/2018), 150 mg (09/26/2018), 150 mg (10/03/2018), 114 mg (10/17/2018), 114 mg (10/24/2018), 114 mg (10/31/2018), 114 mg (11/07/2018), 114 mg (11/14/2018), 114 mg (11/21/2018), 114 mg (11/28/2018) trastuzumab-dkst (OGIVRI) 450 mg in sodium chloride 0.9 % 250 mL chemo infusion, 450 mg (100 % of original dose 450 mg), Intravenous,  Once, 4 of 7 cycles Dose modification: 450 mg (original dose 450 mg, Cycle 9, Reason: Other (see comments), Comment: Biosimilar Conversion) Administration: 450 mg (04/03/2019), 450 mg (04/24/2019), 450 mg (05/15/2019), 450 mg (06/05/2019)  for chemotherapy treatment.    01/17/2019 - 02/13/2019 Radiation Therapy   Adjuvant XRT     CHIEF COMPLIANT: Follow-up of Herceptin maintenance  INTERVAL HISTORY: Gloria Rameshchandra PKotowskiis a 73y.o. with above-mentioned history of left breast cancer treated with lumpectomy,adjuvant chemotherapy, radiation, and whois currently on Herceptin maintenance.She presents to the clinic todayfor treatment.   REVIEW OF SYSTEMS:   Constitutional: Denies fevers, chills or abnormal weight loss Eyes: Denies blurriness of vision Ears, nose, mouth, throat, and face: Denies mucositis or sore throat Respiratory: Denies cough, dyspnea or wheezes Cardiovascular: Denies palpitation, chest discomfort Gastrointestinal: Denies nausea, heartburn or change in bowel habits Skin: Denies abnormal skin rashes Lymphatics: Denies new lymphadenopathy or easy bruising Neurological: Denies numbness, tingling or new weaknesses Behavioral/Psych: Mood is stable, no new changes  Extremities: No lower extremity edema Breast: denies any pain or lumps or nodules in either breasts All other systems were reviewed with the patient and are negative.  I have reviewed the past medical history, past surgical history, social history and family history  with the patient and they are unchanged from previous note.  ALLERGIES:  is allergic to penicillins; cephalexin; and statins.  MEDICATIONS:  Current Outpatient Medications  Medication Sig Dispense Refill  . anastrozole (ARIMIDEX) 1 MG tablet Take 1 tablet (1 mg total) by mouth daily. 90 tablet 3  . aspirin EC 81 MG tablet Take 81 mg by mouth daily.    . Calcium Carb-Cholecalciferol (CALCIUM + VITAMIN D3 PO) Take 1 tablet by mouth daily.    . Empagliflozin-metFORMIN HCl (SYNJARDY) 12.12-998 MG TABS Take 1 tablet by mouth 2 (two) times daily.    Marland Kitchen EXFORGE 5-320 MG tablet Take 1 tablet by mouth daily.    . ferrous sulfate 325 (65 FE) MG tablet Take 325 mg by mouth daily with breakfast.    . glimepiride (AMARYL) 4 MG tablet Take 1 tablet (4 mg total) by mouth 2 (two) times daily.    . hydrochlorothiazide (HYDRODIURIL) 25 MG tablet Take 25 mg by mouth daily.    Marland Kitchen lidocaine-prilocaine (EMLA) cream Apply to affected area once 30 g 3  . omeprazole (PRILOSEC) 40 MG capsule Take 40 mg by mouth every morning.     . simvastatin (ZOCOR) 40 MG tablet Take 40 mg by mouth daily.     No current facility-administered medications for this visit.    Facility-Administered Medications Ordered in Other Visits  Medication Dose Route Frequency Provider Last Rate Last Dose  . sodium chloride flush (NS) 0.9 % injection 10 mL  10 mL Intravenous PRN Nicholas Lose, MD   10 mL at 09/09/18 1114    PHYSICAL EXAMINATION: ECOG PERFORMANCE STATUS: 1 - Symptomatic but completely ambulatory  Vitals:   06/26/19 1145  BP: (!) 162/75  Pulse: 85  Resp: 18  Temp: 98 F (36.7 C)  SpO2: 100%   Filed Weights   06/26/19 1145  Weight: 167 lb 14.4 oz (76.2 kg)    GENERAL: alert, no distress and comfortable SKIN: skin color, texture, turgor are normal, no rashes or significant lesions EYES: normal, Conjunctiva are pink and non-injected, sclera clear OROPHARYNX: no exudate, no erythema and lips, buccal mucosa, and tongue  normal  NECK: supple, thyroid normal size, non-tender, without nodularity LYMPH: no palpable lymphadenopathy in the cervical, axillary or inguinal LUNGS: clear to auscultation and percussion with normal breathing effort HEART: regular rate & rhythm and no murmurs and no lower extremity edema ABDOMEN: abdomen soft, non-tender and normal bowel sounds MUSCULOSKELETAL: no cyanosis of digits and no clubbing  NEURO: alert & oriented x 3 with fluent speech, no focal motor/sensory deficits EXTREMITIES: No lower extremity edema  LABORATORY DATA:  I have reviewed the data as listed CMP Latest Ref Rng & Units 05/15/2019 04/03/2019 02/20/2019  Glucose 70 - 99 mg/dL 149(H) 187(H) 196(H)  BUN 8 - 23 mg/dL '17 12 17  ' Creatinine 0.44 - 1.00 mg/dL 0.89 0.95 0.90  Sodium 135 - 145 mmol/L 136 135 136  Potassium 3.5 - 5.1 mmol/L 4.1 4.4 4.6  Chloride 98 - 111 mmol/L 103 102 105  CO2 22 - 32 mmol/L 23 22 21(L)  Calcium 8.9 - 10.3 mg/dL 9.8 9.4 9.8  Total Protein 6.5 - 8.1 g/dL 7.2 7.1 7.3  Total Bilirubin 0.3 - 1.2 mg/dL 0.4 0.3 0.2(L)  Alkaline Phos 38 - 126 U/L 90 97 104  AST 15 - 41 U/L 16 16 14(L)  ALT 0 - 44 U/L 20 18 16  Lab Results  Component Value Date   WBC 6.7 06/26/2019   HGB 13.7 06/26/2019   HCT 40.7 06/26/2019   MCV 83.2 06/26/2019   PLT 227 06/26/2019   NEUTROABS 4.2 06/26/2019    ASSESSMENT & PLAN:  Malignant neoplasm of upper-inner quadrant of left breast in female, estrogen receptor positive ([facility]) 08/15/18:Left Lumpectomy: IDC grade 3, 1.9 cm, Posterior margin positive (Muscle) due to a transected satellite nodule 0.5 cm, 0/3 LN Neg, ER 80%, PR 70%, Ki-67 40%, HER-2 +3+ by IHC with heterogeneity T1cN0 Stage 1A  Posterior Margin: based on discussions with surgery, it is muscle and hence no further surgery is possible.  Plan:  1.Adjuvant Taxol Herceptin weekly x12completed 4/20/2020followed by Herceptin maintenance for 1 yearto be completed 08/03/2019 2.Adjuvant  radiation therapycompleted 02/13/2019 3.Followed by adjuvant antiestrogen therapy to start January 2021 --------------------------------------------------------------------------------------------------------------------------------------------------------- Current treatment: Adjuvant Herceptinto be completed 08/03/2019 Adjuvant radiationcompleted 02/13/2019 Echocardiogram 11/29/2018: EF 55 to 60%  Iron deficiency anemia:  IV iron given 04/24/2019 currently on oral iron which will be completed in December 2020 hemoglobin improved to 13.7 Anastrozole counseling:We discussed the risks and benefits of anti-estrogen therapy with aromatase inhibitors. These include but not limited to insomnia, hot flashes, mood changes, vaginal dryness, bone density loss, and weight gain. We strongly believe that the benefits far outweigh the risks. Patient understands these risks and consented to starting treatment. Planned treatment duration is 7 years. She will start anastrozole August 11, 2019  I will see her back in 6 weeks with her last Herceptin treatment.   No orders of the defined types were placed in this encounter.  The patient has a good understanding of the overall plan. she agrees with it. she will call with any problems that may develop before the next visit here.  Nicholas Lose, MD 06/26/2019  Julious Oka Dorshimer, am acting as scribe for Dr. Nicholas Lose.  I have reviewed the above documentation for accuracy and completeness, and I agree with the above.

## 2019-06-26 ENCOUNTER — Other Ambulatory Visit: Payer: Self-pay | Admitting: *Deleted

## 2019-06-26 ENCOUNTER — Inpatient Hospital Stay: Payer: Medicare Other | Attending: Hematology and Oncology

## 2019-06-26 ENCOUNTER — Inpatient Hospital Stay (HOSPITAL_BASED_OUTPATIENT_CLINIC_OR_DEPARTMENT_OTHER): Payer: Medicare Other | Admitting: Hematology and Oncology

## 2019-06-26 ENCOUNTER — Other Ambulatory Visit: Payer: Self-pay

## 2019-06-26 ENCOUNTER — Inpatient Hospital Stay: Payer: Medicare Other

## 2019-06-26 DIAGNOSIS — Z170 Estrogen receptor positive status [ER+]: Secondary | ICD-10-CM

## 2019-06-26 DIAGNOSIS — C50212 Malignant neoplasm of upper-inner quadrant of left female breast: Secondary | ICD-10-CM

## 2019-06-26 DIAGNOSIS — Z7984 Long term (current) use of oral hypoglycemic drugs: Secondary | ICD-10-CM | POA: Insufficient documentation

## 2019-06-26 DIAGNOSIS — Z5112 Encounter for antineoplastic immunotherapy: Secondary | ICD-10-CM | POA: Insufficient documentation

## 2019-06-26 DIAGNOSIS — Z923 Personal history of irradiation: Secondary | ICD-10-CM | POA: Diagnosis not present

## 2019-06-26 DIAGNOSIS — Z7982 Long term (current) use of aspirin: Secondary | ICD-10-CM | POA: Diagnosis not present

## 2019-06-26 DIAGNOSIS — Z79899 Other long term (current) drug therapy: Secondary | ICD-10-CM | POA: Insufficient documentation

## 2019-06-26 DIAGNOSIS — Z79811 Long term (current) use of aromatase inhibitors: Secondary | ICD-10-CM | POA: Diagnosis not present

## 2019-06-26 DIAGNOSIS — D509 Iron deficiency anemia, unspecified: Secondary | ICD-10-CM | POA: Diagnosis not present

## 2019-06-26 DIAGNOSIS — Z5181 Encounter for therapeutic drug level monitoring: Secondary | ICD-10-CM

## 2019-06-26 DIAGNOSIS — Z95828 Presence of other vascular implants and grafts: Secondary | ICD-10-CM

## 2019-06-26 LAB — CBC WITH DIFFERENTIAL (CANCER CENTER ONLY)
Abs Immature Granulocytes: 0.02 10*3/uL (ref 0.00–0.07)
Basophils Absolute: 0 10*3/uL (ref 0.0–0.1)
Basophils Relative: 1 %
Eosinophils Absolute: 0.2 10*3/uL (ref 0.0–0.5)
Eosinophils Relative: 2 %
HCT: 40.7 % (ref 36.0–46.0)
Hemoglobin: 13.7 g/dL (ref 12.0–15.0)
Immature Granulocytes: 0 %
Lymphocytes Relative: 26 %
Lymphs Abs: 1.7 10*3/uL (ref 0.7–4.0)
MCH: 28 pg (ref 26.0–34.0)
MCHC: 33.7 g/dL (ref 30.0–36.0)
MCV: 83.2 fL (ref 80.0–100.0)
Monocytes Absolute: 0.6 10*3/uL (ref 0.1–1.0)
Monocytes Relative: 9 %
Neutro Abs: 4.2 10*3/uL (ref 1.7–7.7)
Neutrophils Relative %: 62 %
Platelet Count: 227 10*3/uL (ref 150–400)
RBC: 4.89 MIL/uL (ref 3.87–5.11)
RDW: 17.4 % — ABNORMAL HIGH (ref 11.5–15.5)
WBC Count: 6.7 10*3/uL (ref 4.0–10.5)
nRBC: 0 % (ref 0.0–0.2)

## 2019-06-26 LAB — CMP (CANCER CENTER ONLY)
ALT: 24 U/L (ref 0–44)
AST: 17 U/L (ref 15–41)
Albumin: 3.9 g/dL (ref 3.5–5.0)
Alkaline Phosphatase: 91 U/L (ref 38–126)
Anion gap: 12 (ref 5–15)
BUN: 17 mg/dL (ref 8–23)
CO2: 23 mmol/L (ref 22–32)
Calcium: 10.2 mg/dL (ref 8.9–10.3)
Chloride: 102 mmol/L (ref 98–111)
Creatinine: 1.01 mg/dL — ABNORMAL HIGH (ref 0.44–1.00)
GFR, Est AFR Am: >60 mL/min (ref >60)
GFR, Estimated: 56 mL/min — ABNORMAL LOW (ref >60)
Glucose, Bld: 251 mg/dL — ABNORMAL HIGH (ref 70–99)
Potassium: 4.7 mmol/L (ref 3.5–5.1)
Sodium: 137 mmol/L (ref 135–145)
Total Bilirubin: 0.3 mg/dL (ref 0.3–1.2)
Total Protein: 7.3 g/dL (ref 6.5–8.1)

## 2019-06-26 MED ORDER — SODIUM CHLORIDE 0.9 % IV SOLN
Freq: Once | INTRAVENOUS | Status: AC
  Administered 2019-06-26: 12:00:00 via INTRAVENOUS
  Filled 2019-06-26: qty 250

## 2019-06-26 MED ORDER — SODIUM CHLORIDE 0.9% FLUSH
10.0000 mL | INTRAVENOUS | Status: DC | PRN
  Administered 2019-06-26: 10 mL
  Filled 2019-06-26: qty 10

## 2019-06-26 MED ORDER — GLIMEPIRIDE 4 MG PO TABS: 4.0000 mg | ORAL_TABLET | Freq: Two times a day (BID) | ORAL | Status: DC

## 2019-06-26 MED ORDER — SODIUM CHLORIDE 0.9% FLUSH
10.0000 mL | Freq: Once | INTRAVENOUS | Status: AC
  Administered 2019-06-26: 12:00:00 10 mL
  Filled 2019-06-26: qty 10

## 2019-06-26 MED ORDER — DIPHENHYDRAMINE HCL 25 MG PO CAPS
50.0000 mg | ORAL_CAPSULE | Freq: Once | ORAL | Status: AC
  Administered 2019-06-26: 50 mg via ORAL

## 2019-06-26 MED ORDER — ANASTROZOLE 1 MG PO TABS: 1.0000 mg | ORAL_TABLET | Freq: Every day | ORAL | 3 refills | Status: DC

## 2019-06-26 MED ORDER — ACETAMINOPHEN 325 MG PO TABS
650.0000 mg | ORAL_TABLET | Freq: Once | ORAL | Status: AC
  Administered 2019-06-26: 12:00:00 650 mg via ORAL

## 2019-06-26 MED ORDER — TRASTUZUMAB-DKST CHEMO 150 MG IV SOLR
450.0000 mg | Freq: Once | INTRAVENOUS | Status: AC
  Administered 2019-06-26: 14:00:00 450 mg via INTRAVENOUS
  Filled 2019-06-26: qty 21.43

## 2019-06-26 MED ORDER — HEPARIN SOD (PORK) LOCK FLUSH 100 UNIT/ML IV SOLN
500.0000 [IU] | Freq: Once | INTRAVENOUS | Status: AC | PRN
  Administered 2019-06-26: 15:00:00 500 [IU]
  Filled 2019-06-26: qty 5

## 2019-06-26 MED ORDER — DIPHENHYDRAMINE HCL 25 MG PO CAPS
ORAL_CAPSULE | ORAL | Status: AC
  Filled 2019-06-26: qty 2

## 2019-06-26 MED ORDER — ACETAMINOPHEN 325 MG PO TABS
ORAL_TABLET | ORAL | Status: AC
  Filled 2019-06-26: qty 2

## 2019-06-26 NOTE — Assessment & Plan Note (Signed)
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  Plan:  1.Adjuvant Taxol Herceptin weekly x12completed 4/20/2020followed by Herceptin maintenance for 1 yearto be completed 08/03/2019 2.Adjuvant radiation therapycompleted 02/13/2019 3.Followed by adjuvant antiestrogen therapy to start January 2021 --------------------------------------------------------------------------------------------------------------------------------------------------------- Current treatment: Adjuvant Herceptinto be completed 08/03/2019 Adjuvant radiationcompleted 02/13/2019 Echocardiogram 11/29/2018: EF 55 to 60%  Iron deficiency anemia: First dose of IV iron given 04/24/2019 Anastrozole counseling:We discussed the risks and benefits of anti-estrogen therapy with aromatase inhibitors. These include but not limited to insomnia, hot flashes, mood changes, vaginal dryness, bone density loss, and weight gain. We strongly believe that the benefits far outweigh the risks. Patient understands these risks and consented to starting treatment. Planned treatment duration is 7 years.  Return to clinicIn 3 months for follow-up.  

## 2019-06-26 NOTE — Patient Instructions (Signed)
[facility] Discharge Instructions for Patients Receiving Chemotherapy  Today you received the following chemotherapy agents: Trastuzumab  To help prevent nausea and vomiting after your treatment, we encourage you to take your nausea medication as directed.    If you develop nausea and vomiting that is not controlled by your nausea medication, call the clinic.   BELOW ARE SYMPTOMS THAT SHOULD BE REPORTED IMMEDIATELY:  *FEVER GREATER THAN 100.5 F  *CHILLS WITH OR WITHOUT FEVER  NAUSEA AND VOMITING THAT IS NOT CONTROLLED WITH YOUR NAUSEA MEDICATION  *UNUSUAL SHORTNESS OF BREATH  *UNUSUAL BRUISING OR BLEEDING  TENDERNESS IN MOUTH AND THROAT WITH OR WITHOUT PRESENCE OF ULCERS  *URINARY PROBLEMS  *BOWEL PROBLEMS  UNUSUAL RASH Items with * indicate a potential emergency and should be followed up as soon as possible.  Feel free to call the clinic should you have any questions or concerns. The clinic phone number is [phone].  Please show the [facility] at check-in to the Emergency Department and triage nurse.  Ferumoxytol injection What is this medicine? FERUMOXYTOL is an iron complex. Iron is used to make healthy red blood cells, which carry oxygen and nutrients throughout the body. This medicine is used to treat iron deficiency anemia. This medicine may be used for other purposes; ask your health care provider or pharmacist if you have questions. COMMON BRAND NAME(S): Feraheme What should I tell my health care provider before I take this medicine? They need to know if you have any of these conditions:  anemia not caused by low iron levels  high levels of iron in the blood  magnetic resonance imaging (MRI) test scheduled  an unusual or allergic reaction to iron, other medicines, foods, dyes, or preservatives  pregnant or trying to get pregnant  breast-feeding How should I use this medicine? This medicine is for injection into a vein. It is  given by a health care professional in a hospital or clinic setting. Talk to your pediatrician regarding the use of this medicine in children. Special care may be needed. Overdosage: If you think you have taken too much of this medicine contact a poison control center or emergency room at once. NOTE: This medicine is only for you. Do not share this medicine with others. What if I miss a dose? It is important not to miss your dose. Call your doctor or health care professional if you are unable to keep an appointment. What may interact with this medicine? This medicine may interact with the following medications:  other iron products This list may not describe all possible interactions. Give your health care provider a list of all the medicines, herbs, non-prescription drugs, or dietary supplements you use. Also tell them if you smoke, drink alcohol, or use illegal drugs. Some items may interact with your medicine. What should I watch for while using this medicine? Visit your doctor or healthcare professional regularly. Tell your doctor or healthcare professional if your symptoms do not start to get better or if they get worse. You may need blood work done while you are taking this medicine. You may need to follow a special diet. Talk to your doctor. Foods that contain iron include: whole grains/cereals, dried fruits, beans, or peas, leafy green vegetables, and organ meats (liver, kidney). What side effects may I notice from receiving this medicine? Side effects that you should report to your doctor or health care professional as soon as possible:  allergic reactions like skin rash, itching or hives, swelling  of the face, lips, or tongue  breathing problems  changes in blood pressure  feeling faint or lightheaded, falls  fever or chills  flushing, sweating, or hot feelings  swelling of the ankles or feet Side effects that usually do not require medical attention (report to your doctor or  health care professional if they continue or are bothersome):  diarrhea  headache  nausea, vomiting  stomach pain This list may not describe all possible side effects. Call your doctor for medical advice about side effects. You may report side effects to FDA at 1-800-FDA-1088. Where should I keep my medicine? This drug is given in a hospital or clinic and will not be stored at home. NOTE: This sheet is a summary. It may not cover all possible information. If you have questions about this medicine, talk to your doctor, pharmacist, or health care provider.  2020 Elsevier/Gold Standard (2016-09-14 20:21:10)

## 2019-06-27 ENCOUNTER — Ambulatory Visit (HOSPITAL_COMMUNITY)
Admission: RE | Admit: 2019-06-27 | Discharge: 2019-06-27 | Disposition: A | Discharge status: Home / Self Care | Payer: Medicare Other | Source: Ambulatory Visit | Attending: Hematology and Oncology | Admitting: Hematology and Oncology

## 2019-06-27 DIAGNOSIS — C50919 Malignant neoplasm of unspecified site of unspecified female breast: Secondary | ICD-10-CM | POA: Diagnosis not present

## 2019-06-27 DIAGNOSIS — E119 Type 2 diabetes mellitus without complications: Secondary | ICD-10-CM | POA: Diagnosis not present

## 2019-06-27 DIAGNOSIS — I119 Hypertensive heart disease without heart failure: Secondary | ICD-10-CM | POA: Insufficient documentation

## 2019-06-27 DIAGNOSIS — I081 Rheumatic disorders of both mitral and tricuspid valves: Secondary | ICD-10-CM | POA: Diagnosis not present

## 2019-06-27 DIAGNOSIS — Z5181 Encounter for therapeutic drug level monitoring: Secondary | ICD-10-CM | POA: Insufficient documentation

## 2019-06-27 DIAGNOSIS — E785 Hyperlipidemia, unspecified: Secondary | ICD-10-CM | POA: Insufficient documentation

## 2019-06-27 DIAGNOSIS — Z79899 Other long term (current) drug therapy: Secondary | ICD-10-CM

## 2019-06-27 NOTE — Progress Notes (Signed)
  Echocardiogram 2D Echocardiogram has been performed.  Bobbye Charleston 06/27/2019, 12:12 PM

## 2019-07-17 ENCOUNTER — Inpatient Hospital Stay: Payer: Medicare Other | Attending: Hematology and Oncology

## 2019-07-17 ENCOUNTER — Other Ambulatory Visit: Payer: Self-pay

## 2019-07-17 VITALS — BP 155/75 | HR 81 | Temp 98.5°F | Resp 16

## 2019-07-17 DIAGNOSIS — Z7984 Long term (current) use of oral hypoglycemic drugs: Secondary | ICD-10-CM | POA: Diagnosis not present

## 2019-07-17 DIAGNOSIS — Z5112 Encounter for antineoplastic immunotherapy: Secondary | ICD-10-CM | POA: Diagnosis present

## 2019-07-17 DIAGNOSIS — Z7982 Long term (current) use of aspirin: Secondary | ICD-10-CM | POA: Insufficient documentation

## 2019-07-17 DIAGNOSIS — D509 Iron deficiency anemia, unspecified: Secondary | ICD-10-CM | POA: Diagnosis not present

## 2019-07-17 DIAGNOSIS — C50211 Malignant neoplasm of upper-inner quadrant of right female breast: Secondary | ICD-10-CM | POA: Diagnosis not present

## 2019-07-17 DIAGNOSIS — Z923 Personal history of irradiation: Secondary | ICD-10-CM | POA: Diagnosis not present

## 2019-07-17 DIAGNOSIS — Z170 Estrogen receptor positive status [ER+]: Secondary | ICD-10-CM | POA: Diagnosis not present

## 2019-07-17 DIAGNOSIS — C50212 Malignant neoplasm of upper-inner quadrant of left female breast: Secondary | ICD-10-CM

## 2019-07-17 MED ORDER — DIPHENHYDRAMINE HCL 25 MG PO CAPS
ORAL_CAPSULE | ORAL | Status: AC
  Filled 2019-07-17: qty 2

## 2019-07-17 MED ORDER — SODIUM CHLORIDE 0.9 % IV SOLN
Freq: Once | INTRAVENOUS | Status: AC
  Administered 2019-07-17: 12:00:00 via INTRAVENOUS
  Filled 2019-07-17: qty 250

## 2019-07-17 MED ORDER — SODIUM CHLORIDE 0.9% FLUSH
10.0000 mL | INTRAVENOUS | Status: DC | PRN
  Administered 2019-07-17: 14:00:00 10 mL
  Filled 2019-07-17: qty 10

## 2019-07-17 MED ORDER — ACETAMINOPHEN 325 MG PO TABS
ORAL_TABLET | ORAL | Status: AC
  Filled 2019-07-17: qty 2

## 2019-07-17 MED ORDER — DIPHENHYDRAMINE HCL 25 MG PO CAPS
50.0000 mg | ORAL_CAPSULE | Freq: Once | ORAL | Status: AC
  Administered 2019-07-17: 50 mg via ORAL

## 2019-07-17 MED ORDER — HEPARIN SOD (PORK) LOCK FLUSH 100 UNIT/ML IV SOLN
500.0000 [IU] | Freq: Once | INTRAVENOUS | Status: AC | PRN
  Administered 2019-07-17: 14:00:00 500 [IU]
  Filled 2019-07-17: qty 5

## 2019-07-17 MED ORDER — TRASTUZUMAB-DKST CHEMO 150 MG IV SOLR
450.0000 mg | Freq: Once | INTRAVENOUS | Status: AC
  Administered 2019-07-17: 450 mg via INTRAVENOUS
  Filled 2019-07-17: qty 21.43

## 2019-07-17 MED ORDER — ACETAMINOPHEN 325 MG PO TABS
650.0000 mg | ORAL_TABLET | Freq: Once | ORAL | Status: AC
  Administered 2019-07-17: 650 mg via ORAL

## 2019-07-17 NOTE — Patient Instructions (Signed)
[facility] Discharge Instructions for Patients Receiving Chemotherapy  Today you received the following chemotherapy agents: Trastuzumab  To help prevent nausea and vomiting after your treatment, we encourage you to take your nausea medication as directed.    If you develop nausea and vomiting that is not controlled by your nausea medication, call the clinic.   BELOW ARE SYMPTOMS THAT SHOULD BE REPORTED IMMEDIATELY:  *FEVER GREATER THAN 100.5 F  *CHILLS WITH OR WITHOUT FEVER  NAUSEA AND VOMITING THAT IS NOT CONTROLLED WITH YOUR NAUSEA MEDICATION  *UNUSUAL SHORTNESS OF BREATH  *UNUSUAL BRUISING OR BLEEDING  TENDERNESS IN MOUTH AND THROAT WITH OR WITHOUT PRESENCE OF ULCERS  *URINARY PROBLEMS  *BOWEL PROBLEMS  UNUSUAL RASH Items with * indicate a potential emergency and should be followed up as soon as possible.  Feel free to call the clinic should you have any questions or concerns. The clinic phone number is [phone].  Please show the [facility] at check-in to the Emergency Department and triage nurse.  Ferumoxytol injection What is this medicine? FERUMOXYTOL is an iron complex. Iron is used to make healthy red blood cells, which carry oxygen and nutrients throughout the body. This medicine is used to treat iron deficiency anemia. This medicine may be used for other purposes; ask your health care provider or pharmacist if you have questions. COMMON BRAND NAME(S): Feraheme What should I tell my health care provider before I take this medicine? They need to know if you have any of these conditions:  anemia not caused by low iron levels  high levels of iron in the blood  magnetic resonance imaging (MRI) test scheduled  an unusual or allergic reaction to iron, other medicines, foods, dyes, or preservatives  pregnant or trying to get pregnant  breast-feeding How should I use this medicine? This medicine is for injection into a vein. It is  given by a health care professional in a hospital or clinic setting. Talk to your pediatrician regarding the use of this medicine in children. Special care may be needed. Overdosage: If you think you have taken too much of this medicine contact a poison control center or emergency room at once. NOTE: This medicine is only for you. Do not share this medicine with others. What if I miss a dose? It is important not to miss your dose. Call your doctor or health care professional if you are unable to keep an appointment. What may interact with this medicine? This medicine may interact with the following medications:  other iron products This list may not describe all possible interactions. Give your health care provider a list of all the medicines, herbs, non-prescription drugs, or dietary supplements you use. Also tell them if you smoke, drink alcohol, or use illegal drugs. Some items may interact with your medicine. What should I watch for while using this medicine? Visit your doctor or healthcare professional regularly. Tell your doctor or healthcare professional if your symptoms do not start to get better or if they get worse. You may need blood work done while you are taking this medicine. You may need to follow a special diet. Talk to your doctor. Foods that contain iron include: whole grains/cereals, dried fruits, beans, or peas, leafy green vegetables, and organ meats (liver, kidney). What side effects may I notice from receiving this medicine? Side effects that you should report to your doctor or health care professional as soon as possible:  allergic reactions like skin rash, itching or hives, swelling  of the face, lips, or tongue  breathing problems  changes in blood pressure  feeling faint or lightheaded, falls  fever or chills  flushing, sweating, or hot feelings  swelling of the ankles or feet Side effects that usually do not require medical attention (report to your doctor or  health care professional if they continue or are bothersome):  diarrhea  headache  nausea, vomiting  stomach pain This list may not describe all possible side effects. Call your doctor for medical advice about side effects. You may report side effects to FDA at 1-800-FDA-1088. Where should I keep my medicine? This drug is given in a hospital or clinic and will not be stored at home. NOTE: This sheet is a summary. It may not cover all possible information. If you have questions about this medicine, talk to your doctor, pharmacist, or health care provider.  2020 Elsevier/Gold Standard (2016-09-14 20:21:10)

## 2019-07-26 NOTE — H&P (Signed)
Gloria Mckinney Location: [facility] Patient #: [id number] DOB: May 18, 1946 Married / Language: Gujarati / Race: Undefined Female      History of Present Illness  The patient is a 73 year old female presenting for a post-operative visit. This is a very pleasant 73 year old female. She is here with her daughter for a postop visit following definitive surgery for her left breast cancer. Gloria Mckinney is her oncologist.  Gloria Mckinney is her radiation oncologist.Gloria Mckinney is her PCP.   On August 15, 2018 she underwent Port-A-Cath insertion, left breast lumpectomy and left axillary sentinel node biopsy. The cancer was high in the upper inner quadrant of the left breast. I basically had to take all the breast tissue from the dermis all the way to the pectoralis major muscle. Final pathology shows 1.9 cm invasive adenocarcinoma. Posterior margin is focally involved. Lymph nodes were all negative. 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 We discussed her focal positive posterior margin in conference. There is no more breast tissue to take. Only the muscle. There was no evidence of gross disease at the time of surgery. We have agreed that she will receive chemotherapy and radiation therapy with the boost and that should be all that is necessary. None of [place] think there is any benefit to further surgery to resect her muscle. I explained this again to the patient and her daughter .  She is doing well although she is not moving her left shoulder around much. Every incision is painful Exam showed almost to be healing well however. I offered to refer her to physical therapy and she declined. I gave her a sheet of left shoulder exercises and told her to do this 4 times a day OKdata a to start chemotherapy  This will be followed by radiation therapy See me in 3 months, sooner if necessary Port-A-Cath will be removed, likely at the end of this  year.   Allergies Penicillins  Swollen lips. Allergies Reconciled   Medication History Exforge (5-320MG Tablet, Oral) Active. Iron (325 (65 Fe)MG Tablet, Oral) Active. Jardiance (25MG Tablet, Oral) Active. Toujeo SoloStar (300UNIT/ML Soln Pen-inj, Subcutaneous) Active. metFORMIN HCl (1000MG Tablet, Oral) Active. Medications Reconciled  Vitals  Weight: 168.4 lb Height: 62in Body Surface Area: 1.78 m Body Mass Index: 30.8 kg/m  Temp.: 97.44F  Pulse: 102 (Regular)  BP: 128/86 (Sitting, Left Arm, Standard)       Physical Exam  General Note: Very pleasant and cooperative. Daughter is present throughout the catheter. No obvious distress but says she's having pain   Chest and Lung Exam Note: Port palpable right infraclavicular area. Wound looks good without infection or hematoma  Cardiovascular: Regular rate and rhythm without murmur or ectopy.  Radial pulses intact.  Extremities.  free range of motion.  No edema.  Breast Note: Left breast lumpectomy incision upper inner quadrant and left axilla incision appeared to be healing well. Minimal thickening. No hematoma or infection. Left shoulder abduction is probably about 100. No arm swelling     Assessment & Plan PRIMARY CANCER OF UPPER INNER QUADRANT OF LEFT FEMALE BREAST (C50.212)   All of the wounds are healing well The Port-A-Cath site looks good. The lumpectomy site looks good. The axillary incision looks good. The pains that you are having are due to the surgery and will slowly get better and go away  Your left shoulder is stiff and you need to perform lots of exercise and physical therapy to stretch things out to normal again  Please perform the left shoulder exercises 4 times a day  You'll receive adjuvant chemotherapy with Gloria Mckinney at the cancer center. That will be followed by radiation therapy  We will remove your Port-A-Cath when Gloria Mckinney says that all of your chemotherapy is  completed, likely at the end of this calendar year.    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 Dalbert Batman, M.D., [facility] General and Minimally invasive Surgery Breast and Colorectal Surgery Office:   [phone]

## 2019-08-01 ENCOUNTER — Other Ambulatory Visit: Payer: Self-pay

## 2019-08-01 ENCOUNTER — Encounter (HOSPITAL_BASED_OUTPATIENT_CLINIC_OR_DEPARTMENT_OTHER): Payer: Self-pay | Admitting: General Surgery

## 2019-08-02 NOTE — Progress Notes (Signed)
Patient Care Team: [name], MD as PCP - General (Family Medicine) Nicholas Lose, MD as Consulting Physician (Hematology and Oncology) Delice Bison, Charlestine Massed, NP as Nurse Practitioner (Hematology and Oncology) Fanny Skates, MD as Consulting Physician (General Surgery)  DIAGNOSIS:    ICD-10-CM   1. Malignant neoplasm of upper-inner quadrant of left breast in female, estrogen receptor positive ([name])  C50.212    Z17.0     SUMMARY OF ONCOLOGIC HISTORY: Oncology History  Malignant neoplasm of upper-inner quadrant of left breast in female, estrogen receptor positive ([place])  07/18/2018 Initial Diagnosis   Screening detected left breast mass at 10 o'clock position 1.8 cm axilla negative, biopsy revealed grade 3 IDC ER 80%, PR 70%, Ki-67 40%, HER-2 +3+ by IHC with heterogeneity, T1c N0 stage Ia clinical stage   08/15/2018 Surgery   Left Lumpectomy: IDC grade 3, 1.9 cm, Posterior margin positive (Muscle) due to a transected satellite nodule 0.5 cm, 0/3 LN Neg, ER 80%, PR 70%, Ki-67 40%, HER-2 +3+ by IHC with heterogeneity T1cN0 Stage 1A   08/24/2018 Cancer Staging   Staging form: Breast, AJCC 8th Edition - Pathologic: Stage IA (pT1b, pN0, cM0, G3, ER+, PR+, HER2+) - Signed by Gardenia Phlegm, NP on 08/24/2018   09/09/2018 -  Chemotherapy   The patient had trastuzumab (HERCEPTIN) 300 mg in sodium chloride 0.9 % 250 mL chemo infusion, 294 mg, Intravenous,  Once, 8 of 8 cycles Administration: 300 mg (09/09/2018), 150 mg (09/19/2018), 150 mg (10/10/2018), 450 mg (12/19/2018), 450 mg (01/09/2019), 450 mg (03/13/2019), 150 mg (09/26/2018), 150 mg (10/03/2018), 150 mg (10/17/2018), 150 mg (10/24/2018), 150 mg (10/31/2018), 150 mg (11/07/2018), 150 mg (11/14/2018), 150 mg (11/21/2018), 450 mg (11/28/2018), 450 mg (01/30/2019), 450 mg (02/20/2019) PACLitaxel (TAXOL) 150 mg in sodium chloride 0.9 % 250 mL chemo infusion (</= 62m/m2), 80 mg/m2 = 150 mg, Intravenous,  Once, 3 of 3 cycles Dose modification: 60  mg/m2 (original dose 80 mg/m2, Cycle 2, Reason: Dose not tolerated) Administration: 150 mg (09/09/2018), 150 mg (09/19/2018), 150 mg (10/10/2018), 150 mg (09/26/2018), 150 mg (10/03/2018), 114 mg (10/17/2018), 114 mg (10/24/2018), 114 mg (10/31/2018), 114 mg (11/07/2018), 114 mg (11/14/2018), 114 mg (11/21/2018), 114 mg (11/28/2018) trastuzumab-dkst (OGIVRI) 450 mg in sodium chloride 0.9 % 250 mL chemo infusion, 450 mg (100 % of original dose 450 mg), Intravenous,  Once, 6 of 7 cycles Dose modification: 450 mg (original dose 450 mg, Cycle 9, Reason: Other (see comments), Comment: Biosimilar Conversion) Administration: 450 mg (04/03/2019), 450 mg (04/24/2019), 450 mg (05/15/2019), 450 mg (06/05/2019), 450 mg (06/26/2019), 450 mg (07/17/2019)  for chemotherapy treatment.    01/17/2019 - 02/13/2019 Radiation Therapy   Adjuvant XRT     CHIEF COMPLIANT: Follow-up of Herceptin maintenance  INTERVAL HISTORY: Gloria Mckinney a 73y.o. with above-mentioned history of left breast cancer treated with lumpectomy,adjuvant chemotherapy, radiation, and whois currently on Herceptin maintenance.Echo on 06/27/19 showed an ejection fraction of 55-60%. She presents to the clinic todayfor treatment.  REVIEW OF SYSTEMS:   Constitutional: Denies fevers, chills or abnormal weight loss Eyes: Denies blurriness of vision Ears, nose, mouth, throat, and face: Denies mucositis or sore throat Respiratory: Denies cough, dyspnea or wheezes Cardiovascular: Denies palpitation, chest discomfort Gastrointestinal: Denies nausea, heartburn or change in bowel habits Skin: Denies abnormal skin rashes Lymphatics: Denies new lymphadenopathy or easy bruising Neurological: Denies numbness, tingling or new weaknesses Behavioral/Psych: Mood is stable, no new changes  Extremities: No lower extremity edema Breast: denies any pain or lumps or nodules  in either breasts All other systems were reviewed with the patient and are negative.  I  have reviewed the past medical history, past surgical history, social history and family history with the patient and they are unchanged from previous note.  ALLERGIES:  is allergic to penicillins; cephalexin; and statins.  MEDICATIONS:  Current Outpatient Medications  Medication Sig Dispense Refill  . anastrozole (ARIMIDEX) 1 MG tablet Take 1 tablet (1 mg total) by mouth daily. (Patient not taking: Reported on 07/17/2019) 90 tablet 3  . aspirin EC 81 MG tablet Take 81 mg by mouth daily.    . Calcium Carb-Cholecalciferol (CALCIUM + VITAMIN D3 PO) Take 1 tablet by mouth daily.    . Empagliflozin-metFORMIN HCl (SYNJARDY) 12.12-998 MG TABS Take 1 tablet by mouth 2 (two) times daily.    Marland Kitchen EXFORGE 5-320 MG tablet Take 1 tablet by mouth daily.    Marland Kitchen glimepiride (AMARYL) 4 MG tablet Take 1 tablet (4 mg total) by mouth 2 (two) times daily. (Patient not taking: Reported on 07/17/2019)    . hydrochlorothiazide (HYDRODIURIL) 25 MG tablet Take 25 mg by mouth daily.    Marland Kitchen lidocaine-prilocaine (EMLA) cream Apply to affected area once 30 g 3  . omeprazole (PRILOSEC) 40 MG capsule Take 40 mg by mouth every morning.     . simvastatin (ZOCOR) 40 MG tablet Take 40 mg by mouth daily.     No current facility-administered medications for this visit.   Facility-Administered Medications Ordered in Other Visits  Medication Dose Route Frequency Provider Last Rate Last Admin  . sodium chloride flush (NS) 0.9 % injection 10 mL  10 mL Intravenous PRN Nicholas Lose, MD   10 mL at 09/09/18 1114    PHYSICAL EXAMINATION: ECOG PERFORMANCE STATUS: 1 - Symptomatic but completely ambulatory  There were no vitals filed for this visit. There were no vitals filed for this visit.  GENERAL: alert, no distress and comfortable SKIN: skin color, texture, turgor are normal, no rashes or significant lesions EYES: normal, Conjunctiva are pink and non-injected, sclera clear OROPHARYNX: no exudate, no erythema and lips, buccal mucosa,  and tongue normal  NECK: supple, thyroid normal size, non-tender, without nodularity LYMPH: no palpable lymphadenopathy in the cervical, axillary or inguinal LUNGS: clear to auscultation and percussion with normal breathing effort HEART: regular rate & rhythm and no murmurs and no lower extremity edema ABDOMEN: abdomen soft, non-tender and normal bowel sounds MUSCULOSKELETAL: no cyanosis of digits and no clubbing  NEURO: alert & oriented x 3 with fluent speech, no focal motor/sensory deficits EXTREMITIES: No lower extremity edema  LABORATORY DATA:  I have reviewed the data as listed CMP Latest Ref Rng & Units 06/26/2019 05/15/2019 04/03/2019  Glucose 70 - 99 mg/dL 251(H) 149(H) 187(H)  BUN 8 - 23 mg/dL _0 Creatinine 0.44 - 1.00 mg/dL 1.01(H) 0.89 0.95  Sodium 135 - 145 mmol/L 137 136 135  Potassium 3.5 - 5.1 mmol/L 4.7 4.1 4.4  Chloride 98 - 111 mmol/L 102 103 102  CO2 22 - 32 mmol/L _1 Calcium 8.9 - 10.3 mg/dL 10.2 9.8 9.4  Total Protein 6.5 - 8.1 g/dL 7.3 7.2 7.1  Total Bilirubin 0.3 - 1.2 mg/dL 0.3 0.4 0.3  Alkaline Phos 38 - 126 U/L 91 90 97  AST 15 - 41 U/L _2 ALT 0 - 44 U/L _3 Lab Results  Component Value Date   WBC 7.4 08/03/2019   HGB 12.8 08/03/2019  HCT 37.1 08/03/2019   MCV 82.3 08/03/2019   PLT 222 08/03/2019   NEUTROABS 4.5 08/03/2019    ASSESSMENT & PLAN:  Malignant neoplasm of upper-inner quadrant of left breast in female, estrogen receptor positive ([facility]) 08/15/18:Left Lumpectomy: IDC grade 3, 1.9 cm, Posterior margin positive (Muscle) due to a transected satellite nodule 0.5 cm, 0/3 LN Neg, ER 80%, PR 70%, Ki-67 40%, HER-2 +3+ by IHC with heterogeneity T1cN0 Stage 1A  Posterior Margin: based on discussions with surgery, it is muscle and hence no further surgery is possible.  Plan:  1.Adjuvant Taxol Herceptin weekly x12completed 4/20/2020followed by Herceptin maintenance for 1 yearto be completed 08/03/2019 2.Adjuvant  radiation therapycompleted 02/13/2019 3.Followed by adjuvant antiestrogen therapy to start January 2021 --------------------------------------------------------------------------------------------------------------------------------------------------------- Current treatment: Adjuvant Herceptinto be completed 08/03/2019 (today's last treatment) Adjuvant radiationcompleted 02/13/2019 Echocardiogram 11/29/2018: EF 55 to 60%  Iron deficiencyanemia: IV iron given 04/24/2019 currently on oral iron which will be completed in December 2020 hemoglobin improved to 13.7  She will start anastrozole January 2021 [facility] will be removed   Return to clinic in 3 months for survivorship care plan visit    No orders of the defined types were placed in this encounter.  The patient has a good understanding of the overall plan. she agrees with it. she will call with any problems that may develop before the next visit here.  Nicholas Lose, MD 08/03/2019  Julious Oka Dorshimer, am acting as scribe for Dr. Nicholas Lose.  I have reviewed the above document for accuracy and completeness, and I agree with the above.

## 2019-08-03 ENCOUNTER — Other Ambulatory Visit: Payer: Self-pay

## 2019-08-03 ENCOUNTER — Inpatient Hospital Stay: Payer: Medicare Other

## 2019-08-03 ENCOUNTER — Inpatient Hospital Stay (HOSPITAL_BASED_OUTPATIENT_CLINIC_OR_DEPARTMENT_OTHER): Payer: Medicare Other | Admitting: Hematology and Oncology

## 2019-08-03 DIAGNOSIS — Z5112 Encounter for antineoplastic immunotherapy: Secondary | ICD-10-CM | POA: Diagnosis not present

## 2019-08-03 DIAGNOSIS — Z170 Estrogen receptor positive status [ER+]: Secondary | ICD-10-CM

## 2019-08-03 DIAGNOSIS — Z95828 Presence of other vascular implants and grafts: Secondary | ICD-10-CM

## 2019-08-03 DIAGNOSIS — C50212 Malignant neoplasm of upper-inner quadrant of left female breast: Secondary | ICD-10-CM | POA: Diagnosis not present

## 2019-08-03 LAB — CMP (CANCER CENTER ONLY)
ALT: 22 U/L (ref 0–44)
AST: 16 U/L (ref 15–41)
Albumin: 3.8 g/dL (ref 3.5–5.0)
Alkaline Phosphatase: 93 U/L (ref 38–126)
Anion gap: 15 (ref 5–15)
BUN: 14 mg/dL (ref 8–23)
CO2: 21 mmol/L — ABNORMAL LOW (ref 22–32)
Calcium: 9.8 mg/dL (ref 8.9–10.3)
Chloride: 103 mmol/L (ref 98–111)
Creatinine: 0.92 mg/dL (ref 0.44–1.00)
GFR, Est AFR Am: >60 mL/min (ref >60)
GFR, Estimated: >60 mL/min (ref >60)
Glucose, Bld: 245 mg/dL — ABNORMAL HIGH (ref 70–99)
Potassium: 4.3 mmol/L (ref 3.5–5.1)
Sodium: 139 mmol/L (ref 135–145)
Total Bilirubin: 0.3 mg/dL (ref 0.3–1.2)
Total Protein: 7.1 g/dL (ref 6.5–8.1)

## 2019-08-03 LAB — CBC WITH DIFFERENTIAL (CANCER CENTER ONLY)
Abs Immature Granulocytes: 0.04 10*3/uL (ref 0.00–0.07)
Basophils Absolute: 0 10*3/uL (ref 0.0–0.1)
Basophils Relative: 0 %
Eosinophils Absolute: 0.2 10*3/uL (ref 0.0–0.5)
Eosinophils Relative: 3 %
HCT: 37.1 % (ref 36.0–46.0)
Hemoglobin: 12.8 g/dL (ref 12.0–15.0)
Immature Granulocytes: 1 %
Lymphocytes Relative: 27 %
Lymphs Abs: 2 10*3/uL (ref 0.7–4.0)
MCH: 28.4 pg (ref 26.0–34.0)
MCHC: 34.5 g/dL (ref 30.0–36.0)
MCV: 82.3 fL (ref 80.0–100.0)
Monocytes Absolute: 0.6 10*3/uL (ref 0.1–1.0)
Monocytes Relative: 9 %
Neutro Abs: 4.5 10*3/uL (ref 1.7–7.7)
Neutrophils Relative %: 60 %
Platelet Count: 222 10*3/uL (ref 150–400)
RBC: 4.51 MIL/uL (ref 3.87–5.11)
RDW: 13.8 % (ref 11.5–15.5)
WBC Count: 7.4 10*3/uL (ref 4.0–10.5)
nRBC: 0 % (ref 0.0–0.2)

## 2019-08-03 MED ORDER — TRASTUZUMAB-DKST CHEMO 150 MG IV SOLR
450.0000 mg | Freq: Once | INTRAVENOUS | Status: AC
  Administered 2019-08-03: 450 mg via INTRAVENOUS
  Filled 2019-08-03: qty 21.43

## 2019-08-03 MED ORDER — SODIUM CHLORIDE 0.9% FLUSH
10.0000 mL | Freq: Once | INTRAVENOUS | Status: AC
  Administered 2019-08-03: 10 mL
  Filled 2019-08-03: qty 10

## 2019-08-03 MED ORDER — DIPHENHYDRAMINE HCL 25 MG PO CAPS
50.0000 mg | ORAL_CAPSULE | Freq: Once | ORAL | Status: AC
  Administered 2019-08-03: 50 mg via ORAL

## 2019-08-03 MED ORDER — HEPARIN SOD (PORK) LOCK FLUSH 100 UNIT/ML IV SOLN
500.0000 [IU] | Freq: Once | INTRAVENOUS | Status: AC | PRN
  Administered 2019-08-03: 500 [IU]
  Filled 2019-08-03: qty 5

## 2019-08-03 MED ORDER — SODIUM CHLORIDE 0.9 % IV SOLN
Freq: Once | INTRAVENOUS | Status: AC
  Filled 2019-08-03: qty 250

## 2019-08-03 MED ORDER — ACETAMINOPHEN 325 MG PO TABS
ORAL_TABLET | ORAL | Status: AC
  Filled 2019-08-03: qty 2

## 2019-08-03 MED ORDER — DIPHENHYDRAMINE HCL 25 MG PO CAPS
ORAL_CAPSULE | ORAL | Status: AC
  Filled 2019-08-03: qty 2

## 2019-08-03 MED ORDER — SODIUM CHLORIDE 0.9% FLUSH
10.0000 mL | INTRAVENOUS | Status: DC | PRN
  Administered 2019-08-03: 10 mL
  Filled 2019-08-03: qty 10

## 2019-08-03 MED ORDER — ACETAMINOPHEN 325 MG PO TABS
650.0000 mg | ORAL_TABLET | Freq: Once | ORAL | Status: AC
  Administered 2019-08-03: 650 mg via ORAL

## 2019-08-03 NOTE — Assessment & Plan Note (Signed)
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  Plan:  1.Adjuvant Taxol Herceptin weekly x12completed 4/20/2020followed by Herceptin maintenance for 1 yearto be completed 08/03/2019 2.Adjuvant radiation therapycompleted 02/13/2019 3.Followed by adjuvant antiestrogen therapy to start January 2021 --------------------------------------------------------------------------------------------------------------------------------------------------------- Current treatment: Adjuvant Herceptinto be completed 08/03/2019 (today's last treatment) Adjuvant radiationcompleted 02/13/2019 Echocardiogram 11/29/2018: EF 55 to 60%  Iron deficiencyanemia: IV iron given 04/24/2019 currently on oral iron which will be completed in December 2020 hemoglobin improved to 13.7 Anastrozole toxicities:  Port will be removed   Return to clinic in 3 months for survivorship care plan visit

## 2019-08-03 NOTE — Patient Instructions (Signed)
[place] Discharge Instructions for Patients Receiving Chemotherapy  Today you received the following chemotherapy agents: Trastuzumab  To help prevent nausea and vomiting after your treatment, we encourage you to take your nausea medication as directed.    If you develop nausea and vomiting that is not controlled by your nausea medication, call the clinic.   BELOW ARE SYMPTOMS THAT SHOULD BE REPORTED IMMEDIATELY:  *FEVER GREATER THAN 100.5 F  *CHILLS WITH OR WITHOUT FEVER  NAUSEA AND VOMITING THAT IS NOT CONTROLLED WITH YOUR NAUSEA MEDICATION  *UNUSUAL SHORTNESS OF BREATH  *UNUSUAL BRUISING OR BLEEDING  TENDERNESS IN MOUTH AND THROAT WITH OR WITHOUT PRESENCE OF ULCERS  *URINARY PROBLEMS  *BOWEL PROBLEMS  UNUSUAL RASH Items with * indicate a potential emergency and should be followed up as soon as possible.  Feel free to call the clinic should you have any questions or concerns. The clinic phone number is [phone].  Please show the [place] at check-in to the Emergency Department and triage nurse.  Ferumoxytol injection What is this medicine? FERUMOXYTOL is an iron complex. Iron is used to make healthy red blood cells, which carry oxygen and nutrients throughout the body. This medicine is used to treat iron deficiency anemia. This medicine may be used for other purposes; ask your health care provider or pharmacist if you have questions. COMMON BRAND NAME(S): Feraheme What should I tell my health care provider before I take this medicine? They need to know if you have any of these conditions:  anemia not caused by low iron levels  high levels of iron in the blood  magnetic resonance imaging (MRI) test scheduled  an unusual or allergic reaction to iron, other medicines, foods, dyes, or preservatives  pregnant or trying to get pregnant  breast-feeding How should I use this medicine? This medicine is for injection into a vein. It is  given by a health care professional in a hospital or clinic setting. Talk to your pediatrician regarding the use of this medicine in children. Special care may be needed. Overdosage: If you think you have taken too much of this medicine contact a poison control center or emergency room at once. NOTE: This medicine is only for you. Do not share this medicine with others. What if I miss a dose? It is important not to miss your dose. Call your doctor or health care professional if you are unable to keep an appointment. What may interact with this medicine? This medicine may interact with the following medications:  other iron products This list may not describe all possible interactions. Give your health care provider a list of all the medicines, herbs, non-prescription drugs, or dietary supplements you use. Also tell them if you smoke, drink alcohol, or use illegal drugs. Some items may interact with your medicine. What should I watch for while using this medicine? Visit your doctor or healthcare professional regularly. Tell your doctor or healthcare professional if your symptoms do not start to get better or if they get worse. You may need blood work done while you are taking this medicine. You may need to follow a special diet. Talk to your doctor. Foods that contain iron include: whole grains/cereals, dried fruits, beans, or peas, leafy green vegetables, and organ meats (liver, kidney). What side effects may I notice from receiving this medicine? Side effects that you should report to your doctor or health care professional as soon as possible:  allergic reactions like skin rash, itching or hives, swelling  of the face, lips, or tongue  breathing problems  changes in blood pressure  feeling faint or lightheaded, falls  fever or chills  flushing, sweating, or hot feelings  swelling of the ankles or feet Side effects that usually do not require medical attention (report to your doctor or  health care professional if they continue or are bothersome):  diarrhea  headache  nausea, vomiting  stomach pain This list may not describe all possible side effects. Call your doctor for medical advice about side effects. You may report side effects to FDA at 1-800-FDA-1088. Where should I keep my medicine? This drug is given in a hospital or clinic and will not be stored at home. NOTE: This sheet is a summary. It may not cover all possible information. If you have questions about this medicine, talk to your doctor, pharmacist, or health care provider.  2020 Elsevier/Gold Standard (2016-09-14 20:21:10)

## 2019-08-05 ENCOUNTER — Other Ambulatory Visit (HOSPITAL_COMMUNITY)
Admission: RE | Admit: 2019-08-05 | Discharge: 2019-08-05 | Disposition: A | Discharge status: Home / Self Care | Payer: Medicare Other | Source: Ambulatory Visit | Attending: General Surgery | Admitting: General Surgery

## 2019-08-05 DIAGNOSIS — Z20828 Contact with and (suspected) exposure to other viral communicable diseases: Secondary | ICD-10-CM | POA: Insufficient documentation

## 2019-08-05 DIAGNOSIS — Z01812 Encounter for preprocedural laboratory examination: Secondary | ICD-10-CM | POA: Diagnosis present

## 2019-08-06 LAB — NOVEL CORONAVIRUS, NAA (HOSP ORDER, SEND-OUT TO REF LAB; TAT 18-24 HRS): SARS-CoV-2, NAA: NOT DETECTED

## 2019-08-07 ENCOUNTER — Other Ambulatory Visit: Payer: Medicare Other

## 2019-08-07 ENCOUNTER — Ambulatory Visit: Payer: Medicare Other | Admitting: Hematology and Oncology

## 2019-08-07 ENCOUNTER — Ambulatory Visit: Payer: Medicare Other

## 2019-08-09 ENCOUNTER — Ambulatory Visit (HOSPITAL_BASED_OUTPATIENT_CLINIC_OR_DEPARTMENT_OTHER): Payer: Medicare Other | Admitting: Certified Registered"

## 2019-08-09 ENCOUNTER — Encounter (HOSPITAL_BASED_OUTPATIENT_CLINIC_OR_DEPARTMENT_OTHER)
Admission: RE | Disposition: A | Discharge status: Home / Self Care | Payer: Self-pay | Source: Home / Self Care | Attending: General Surgery

## 2019-08-09 ENCOUNTER — Encounter (HOSPITAL_BASED_OUTPATIENT_CLINIC_OR_DEPARTMENT_OTHER): Payer: Self-pay | Admitting: General Surgery

## 2019-08-09 ENCOUNTER — Other Ambulatory Visit: Payer: Self-pay

## 2019-08-09 ENCOUNTER — Ambulatory Visit (HOSPITAL_BASED_OUTPATIENT_CLINIC_OR_DEPARTMENT_OTHER)
Admission: RE | Admit: 2019-08-09 | Discharge: 2019-08-09 | Disposition: A | Discharge status: Home / Self Care | Payer: Medicare Other | Attending: General Surgery | Admitting: General Surgery

## 2019-08-09 DIAGNOSIS — Z794 Long term (current) use of insulin: Secondary | ICD-10-CM | POA: Diagnosis not present

## 2019-08-09 DIAGNOSIS — C50212 Malignant neoplasm of upper-inner quadrant of left female breast: Secondary | ICD-10-CM

## 2019-08-09 DIAGNOSIS — M1710 Unilateral primary osteoarthritis, unspecified knee: Secondary | ICD-10-CM | POA: Diagnosis not present

## 2019-08-09 DIAGNOSIS — Z170 Estrogen receptor positive status [ER+]: Secondary | ICD-10-CM

## 2019-08-09 DIAGNOSIS — D649 Anemia, unspecified: Secondary | ICD-10-CM | POA: Diagnosis not present

## 2019-08-09 DIAGNOSIS — E119 Type 2 diabetes mellitus without complications: Secondary | ICD-10-CM | POA: Insufficient documentation

## 2019-08-09 DIAGNOSIS — Z95828 Presence of other vascular implants and grafts: Secondary | ICD-10-CM

## 2019-08-09 DIAGNOSIS — Z452 Encounter for adjustment and management of vascular access device: Secondary | ICD-10-CM | POA: Diagnosis not present

## 2019-08-09 DIAGNOSIS — I10 Essential (primary) hypertension: Secondary | ICD-10-CM | POA: Insufficient documentation

## 2019-08-09 DIAGNOSIS — E785 Hyperlipidemia, unspecified: Secondary | ICD-10-CM | POA: Diagnosis not present

## 2019-08-09 HISTORY — DX: Malignant (primary) neoplasm, unspecified: C80.1

## 2019-08-09 HISTORY — PX: PORT-A-CATH REMOVAL: SHX5289

## 2019-08-09 LAB — GLUCOSE, CAPILLARY: Glucose-Capillary: 149 mg/dL — ABNORMAL HIGH (ref 70–99)

## 2019-08-09 SURGERY — REMOVAL PORT-A-CATH
Anesthesia: Monitor Anesthesia Care | Site: Chest | Laterality: Right

## 2019-08-09 MED ORDER — ACETAMINOPHEN 500 MG PO TABS
ORAL_TABLET | ORAL | Status: AC
  Filled 2019-08-09: qty 2

## 2019-08-09 MED ORDER — FENTANYL CITRATE (PF) 100 MCG/2ML IJ SOLN
INTRAMUSCULAR | Status: DC | PRN
  Administered 2019-08-09: 50 ug via INTRAVENOUS
  Administered 2019-08-09: 25 ug via INTRAVENOUS

## 2019-08-09 MED ORDER — SODIUM BICARBONATE 4 % IV SOLN
INTRAVENOUS | Status: DC | PRN
  Administered 2019-08-09: 1 mL via SUBCUTANEOUS

## 2019-08-09 MED ORDER — CHLORHEXIDINE GLUCONATE CLOTH 2 % EX PADS: 6.0000 | MEDICATED_PAD | Freq: Once | CUTANEOUS | Status: DC

## 2019-08-09 MED ORDER — LIDOCAINE 2% (20 MG/ML) 5 ML SYRINGE
INTRAMUSCULAR | Status: AC
  Filled 2019-08-09: qty 5

## 2019-08-09 MED ORDER — ACETAMINOPHEN 500 MG PO TABS
1000.0000 mg | ORAL_TABLET | ORAL | Status: AC
  Administered 2019-08-09: 1000 mg via ORAL

## 2019-08-09 MED ORDER — LACTATED RINGERS IV SOLN: INTRAVENOUS | Status: DC

## 2019-08-09 MED ORDER — LIDOCAINE 1 % OPTIME INJ - NO CHARGE
INTRAMUSCULAR | Status: DC | PRN
  Administered 2019-08-09: 12 mL

## 2019-08-09 MED ORDER — PROPOFOL 500 MG/50ML IV EMUL
INTRAVENOUS | Status: AC
  Filled 2019-08-09: qty 50

## 2019-08-09 MED ORDER — CELECOXIB 200 MG PO CAPS
ORAL_CAPSULE | ORAL | Status: AC
  Filled 2019-08-09: qty 1

## 2019-08-09 MED ORDER — GABAPENTIN 300 MG PO CAPS: 300.0000 mg | ORAL_CAPSULE | ORAL | Status: DC

## 2019-08-09 MED ORDER — CELECOXIB 200 MG PO CAPS
200.0000 mg | ORAL_CAPSULE | ORAL | Status: AC
  Administered 2019-08-09: 200 mg via ORAL

## 2019-08-09 MED ORDER — FENTANYL CITRATE (PF) 100 MCG/2ML IJ SOLN
INTRAMUSCULAR | Status: AC
  Filled 2019-08-09: qty 2

## 2019-08-09 MED ORDER — PROPOFOL 500 MG/50ML IV EMUL
INTRAVENOUS | Status: DC | PRN
  Administered 2019-08-09: 75 ug/kg/min via INTRAVENOUS

## 2019-08-09 MED ORDER — CEFAZOLIN SODIUM-DEXTROSE 2-4 GM/100ML-% IV SOLN
INTRAVENOUS | Status: AC
  Filled 2019-08-09: qty 100

## 2019-08-09 MED ORDER — SODIUM CHLORIDE 0.9% FLUSH: 3.0000 mL | Freq: Two times a day (BID) | INTRAVENOUS | Status: DC

## 2019-08-09 MED ORDER — ONDANSETRON HCL 4 MG/2ML IJ SOLN
INTRAMUSCULAR | Status: DC | PRN
  Administered 2019-08-09: 4 mg via INTRAVENOUS

## 2019-08-09 MED ORDER — CEFAZOLIN SODIUM-DEXTROSE 2-4 GM/100ML-% IV SOLN: 2.0000 g | INTRAVENOUS | Status: DC

## 2019-08-09 SURGICAL SUPPLY — 36 items
ADH SKN CLS APL DERMABOND .7 (GAUZE/BANDAGES/DRESSINGS) ×1
APL PRP STRL LF DISP 70% ISPRP (MISCELLANEOUS) ×1
BENZOIN TINCTURE PRP APPL 2/3 (GAUZE/BANDAGES/DRESSINGS) IMPLANT
BLADE HEX COATED 2.75 (ELECTRODE) ×2 IMPLANT
BLADE SURG 15 STRL LF DISP TIS (BLADE) ×1 IMPLANT
BLADE SURG 15 STRL SS (BLADE) ×2
CHLORAPREP W/TINT 26 (MISCELLANEOUS) ×2 IMPLANT
COVER BACK TABLE REUSABLE LG (DRAPES) ×2 IMPLANT
COVER MAYO STAND REUSABLE (DRAPES) ×2 IMPLANT
COVER WAND RF STERILE (DRAPES) IMPLANT
DECANTER SPIKE VIAL GLASS SM (MISCELLANEOUS) IMPLANT
DERMABOND ADVANCED (GAUZE/BANDAGES/DRESSINGS) ×1
DERMABOND ADVANCED .7 DNX12 (GAUZE/BANDAGES/DRESSINGS) ×1 IMPLANT
DRAPE LAPAROTOMY 100X72 PEDS (DRAPES) ×2 IMPLANT
DRAPE UTILITY XL STRL (DRAPES) ×2 IMPLANT
DRSG TEGADERM 4X4.75 (GAUZE/BANDAGES/DRESSINGS) IMPLANT
ELECT REM PT RETURN 9FT ADLT (ELECTROSURGICAL) ×2
ELECTRODE REM PT RTRN 9FT ADLT (ELECTROSURGICAL) ×1 IMPLANT
GAUZE SPONGE 4X4 12PLY STRL LF (GAUZE/BANDAGES/DRESSINGS) IMPLANT
GLOVE SS BIOGEL STRL SZ 7 (GLOVE) ×1 IMPLANT
GLOVE SUPERSENSE BIOGEL SZ 7 (GLOVE) ×1
GOWN STRL REUS W/ TWL LRG LVL3 (GOWN DISPOSABLE) ×1 IMPLANT
GOWN STRL REUS W/ TWL XL LVL3 (GOWN DISPOSABLE) ×1 IMPLANT
GOWN STRL REUS W/TWL LRG LVL3 (GOWN DISPOSABLE) ×2
GOWN STRL REUS W/TWL XL LVL3 (GOWN DISPOSABLE) ×2
NEEDLE HYPO 25X1 1.5 SAFETY (NEEDLE) ×2 IMPLANT
PACK BASIN DAY SURGERY FS (CUSTOM PROCEDURE TRAY) ×2 IMPLANT
PENCIL SMOKE EVACUATOR (MISCELLANEOUS) ×2 IMPLANT
SLEEVE SCD COMPRESS KNEE MED (MISCELLANEOUS) ×2 IMPLANT
STRIP CLOSURE SKIN 1/2X4 (GAUZE/BANDAGES/DRESSINGS) IMPLANT
SUT MNCRL AB 4-0 PS2 18 (SUTURE) ×2 IMPLANT
SUT VICRYL 3-0 CR8 SH (SUTURE) ×2 IMPLANT
SYR 10ML LL (SYRINGE) ×2 IMPLANT
TOWEL GREEN STERILE FF (TOWEL DISPOSABLE) ×2 IMPLANT
TUBE CONNECTING 20X1/4 (TUBING) IMPLANT
YANKAUER SUCT BULB TIP NO VENT (SUCTIONS) IMPLANT

## 2019-08-09 NOTE — Interval H&P Note (Signed)
History and Physical Interval Note:  08/09/2019 9:22 AM  Gloria Mckinney  has presented today for surgery, with the diagnosis of LEFT BREAST CANCER.  The various methods of treatment have been discussed with the patient and family. After consideration of risks, benefits and other options for treatment, the patient has consented to  Procedure(s): REMOVAL PORT-A-CATH (N/A) as a surgical intervention.  The patient's history has been reviewed, patient examined, no change in status, stable for surgery.  I have reviewed the patient's chart and labs.  Questions were answered to the patient's satisfaction.     Adin Hector

## 2019-08-09 NOTE — Discharge Instructions (Signed)
For pain, take Tylenol, 1000 mg every 6 hours for 48 hours. That is all the pain medicine you should need  Ice pack for 10 minutes at a time, twice an hour for 24 hours while awake  Move your shoulder around normally  You should stay in the house until tomorrow  You may start taking showers tomorrow  The clear plastic superglue will wear off in about 3 weeks  As we discussed, you are now due to have bilateral diagnostic 3D mammograms. Please call and schedule these at the breast center of [facility] in early January  For surgical follow-up. Dr. Dalbert Batman will assign you to one of the breast cancer surgeons in his practice and you should see them once a year. The first appointment should be in January or February after the mammograms are done. If you need help making this appointment, speak to Dr. Darrel Hoover nurse, Livia Snellen.   Post Anesthesia Home Care Instructions  Activity: Get plenty of rest for the remainder of the day. A responsible individual must stay with you for 24 hours following the procedure.  For the next 24 hours, DO NOT: -Drive a car -Paediatric nurse -Drink alcoholic beverages -Take any medication unless instructed by your physician -Make any legal decisions or sign important papers.  Meals: Start with liquid foods such as gelatin or soup. Progress to regular foods as tolerated. Avoid greasy, spicy, heavy foods. If nausea and/or vomiting occur, drink only clear liquids until the nausea and/or vomiting subsides. Call your physician if vomiting continues.  Special Instructions/Symptoms: Your throat may feel dry or sore from the anesthesia or the breathing tube placed in your throat during surgery. If this causes discomfort, gargle with warm salt water. The discomfort should disappear within 24 hours.  If you had a scopolamine patch placed behind your ear for the management of post- operative nausea and/or vomiting:  1. The medication in the patch is effective for 72 hours,  after which it should be removed.  Wrap patch in a tissue and discard in the trash. Wash hands thoroughly with soap and water. 2. You may remove the patch earlier than 72 hours if you experience unpleasant side effects which may include dry mouth, dizziness or visual disturbances. 3. Avoid touching the patch. Wash your hands with soap and water after contact with the patch.

## 2019-08-09 NOTE — Anesthesia Postprocedure Evaluation (Signed)
Anesthesia Post Note  Patient: Gloria Mckinney  Procedure(s) Performed: REMOVAL PORT-A-CATH (Right Chest)     Patient location during evaluation: PACU Anesthesia Type: MAC Level of consciousness: awake and alert Pain management: pain level controlled Vital Signs Assessment: post-procedure vital signs reviewed and stable Respiratory status: spontaneous breathing, nonlabored ventilation, respiratory function stable and patient connected to nasal cannula oxygen Cardiovascular status: stable and blood pressure returned to baseline Postop Assessment: no apparent nausea or vomiting Anesthetic complications: no    Last Vitals:  Vitals:   08/09/19 1045 08/09/19 1050  BP: (!) 147/72   Pulse: 69   Resp: 16   Temp: 36.5 C   SpO2: 100% 100%    Last Pain:  Vitals:   08/09/19 1045  TempSrc: Oral  PainSc: 0-No pain                 Treesa Mccully S

## 2019-08-09 NOTE — Transfer of Care (Signed)
Immediate Anesthesia Transfer of Care Note  Patient: Gloria Mckinney  Procedure(s) Performed: REMOVAL PORT-A-CATH (Right Chest)  Patient Location: PACU  Anesthesia Type:MAC  Level of Consciousness: awake, alert  and oriented  Airway & Oxygen Therapy: Patient Spontanous Breathing  Post-op Assessment: Report given to RN and Post -op Vital signs reviewed and stable  Post vital signs: Reviewed and stable  Last Vitals:  Vitals Value Taken Time  BP    Temp    Pulse 75 08/09/19 1014  Resp 19 08/09/19 1014  SpO2 100 % 08/09/19 1014  Vitals shown include unvalidated device data.  Last Pain:  Vitals:   08/09/19 0928  TempSrc: Temporal  PainSc: 0-No pain         Complications: No apparent anesthesia complications

## 2019-08-09 NOTE — Anesthesia Preprocedure Evaluation (Signed)
Anesthesia Evaluation  Patient identified by MRN, date of birth, ID band Patient awake    Reviewed: Allergy & Precautions, H&P , NPO status , Patient's Chart, lab work & pertinent test results  Airway Mallampati: II   Neck ROM: full    Dental   Pulmonary neg pulmonary ROS,    breath sounds clear to auscultation       Cardiovascular hypertension, + Peripheral Vascular Disease   Rhythm:regular Rate:Normal     Neuro/Psych  Neuromuscular disease    GI/Hepatic GERD  ,  Endo/Other  diabetes, Type 2  Renal/GU      Musculoskeletal  (+) Arthritis ,   Abdominal   Peds  Hematology   Anesthesia Other Findings   Reproductive/Obstetrics H/o breast CA                             Anesthesia Physical Anesthesia Plan  ASA: II  Anesthesia Plan: MAC   Post-op Pain Management:    Induction: Intravenous  PONV Risk Score and Plan: 2 and Propofol infusion, Ondansetron and Treatment may vary due to age or medical condition  Airway Management Planned: Simple Face Mask  Additional Equipment:   Intra-op Plan:   Post-operative Plan:   Informed Consent: I have reviewed the patients History and Physical, chart, labs and discussed the procedure including the risks, benefits and alternatives for the proposed anesthesia with the patient or authorized representative who has indicated his/her understanding and acceptance.       Plan Discussed with: CRNA, Anesthesiologist and Surgeon  Anesthesia Plan Comments:         Anesthesia Quick Evaluation

## 2019-08-09 NOTE — Op Note (Signed)
Patient Name:           Gloria Mckinney   Date of Surgery:        08/09/2019  Pre op Diagnosis:      Invasive carcinoma left breast                                      Port-A-Cath in place  Post op Diagnosis:    Same  Procedure:                 Removal of Port-A-Cath  Surgeon:                     Edsel Petrin. Dalbert Batman, M.D., FACS  Assistant:                      OR staff  Operative Indications:   The patient is a 73 year old female presenting for removal of her Port-A-Cath. She is here with her daughter. Dr. Lindi Adie is her oncologist. Jennette Kettle is her PCP.       On August 15, 2018 she underwent Port-A-Cath insertion, left breast lumpectomy and left axillary sentinel node biopsy. The cancer was high in the upper inner quadrant of the left breast. I basically had to take all the breast tissue from the dermis all the way to the pectoralis major muscle. Final pathology shows 1.9 cm invasive adenocarcinoma. Posterior margin is focally involved. Lymph nodes were all negative. 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 We discussed her focal positive posterior margin in conference. There is no more breast tissue to take. Only the muscle. There was no evidence of gross disease at the time of surgery. We have agreed that she will receive chemotherapy and radiation therapy with the boost and that should be all that is necessary. None of [place] think there is any benefit to further surgery to resect her muscle. I explained this again to the patient and her daughter .     She has completed chemotherapy and radiation therapy. She is now on anastrozole. Breast exam reveals no evidence of cancer or adenopathy on either side. She is due for mammograms now and is going to do that in January she was referred back to me by her oncologist for Port-A-Cath removal. I have discussed this with her and her daughter.   Operative Findings:       The port and catheter were removed  from the right infraclavicular area without difficulty. There was no evidence of infection or bleeding.  Procedure in Detail:          The patient was brought to the operating room and placed supine on the operating table. She was monitored and sedated by the anesthesia department. The right upper chest and lower neck were prepped and draped in a sterile fashion. Surgical timeout was performed. 1% Xylocaine with epinephrine was used as a local infiltration anesthetic. A transverse incision was made in the right infraclavicular area through the old scar. The port was dissected away from its capsule. All 3 Prolene sutures were cut and removed. The port and catheter were removed without difficulty. There was no bleeding. The subcutaneous tissue was closed with 3-0 Vicryl sutures and the skin closed with a running subcuticular 4-0 Monocryl and Dermabond. The patient tolerated the procedure well and was taken to PACU in stable condition. EBL 2 cc. Counts  correct. Complications none.     Edsel Petrin. Dalbert Batman, M.D., FACS General and Minimally Invasive Surgery Breast and Colorectal Surgery  08/09/2019 10:12 AM

## 2019-08-10 ENCOUNTER — Encounter: Payer: Self-pay | Admitting: *Deleted

## 2019-08-14 ENCOUNTER — Other Ambulatory Visit: Payer: Self-pay | Admitting: General Surgery

## 2019-08-14 DIAGNOSIS — C50212 Malignant neoplasm of upper-inner quadrant of left female breast: Secondary | ICD-10-CM

## 2019-09-01 ENCOUNTER — Other Ambulatory Visit: Payer: Self-pay

## 2019-09-01 ENCOUNTER — Other Ambulatory Visit: Payer: Self-pay | Admitting: General Surgery

## 2019-09-01 ENCOUNTER — Ambulatory Visit
Admission: RE | Admit: 2019-09-01 | Discharge: 2019-09-01 | Disposition: A | Discharge status: Home / Self Care | Payer: Medicare Other | Source: Ambulatory Visit | Attending: General Surgery | Admitting: General Surgery

## 2019-09-01 DIAGNOSIS — R921 Mammographic calcification found on diagnostic imaging of breast: Secondary | ICD-10-CM

## 2019-09-01 DIAGNOSIS — C50212 Malignant neoplasm of upper-inner quadrant of left female breast: Secondary | ICD-10-CM

## 2019-09-11 ENCOUNTER — Ambulatory Visit
Admission: RE | Admit: 2019-09-11 | Discharge: 2019-09-11 | Disposition: A | Discharge status: Home / Self Care | Payer: Medicare Other | Source: Ambulatory Visit | Attending: General Surgery | Admitting: General Surgery

## 2019-09-11 ENCOUNTER — Other Ambulatory Visit: Payer: Self-pay

## 2019-09-11 DIAGNOSIS — R921 Mammographic calcification found on diagnostic imaging of breast: Secondary | ICD-10-CM

## 2019-09-27 ENCOUNTER — Other Ambulatory Visit: Payer: Self-pay | Admitting: General Surgery

## 2019-09-27 ENCOUNTER — Ambulatory Visit
Admission: RE | Admit: 2019-09-27 | Discharge: 2019-09-27 | Disposition: A | Discharge status: Home / Self Care | Payer: Medicare Other | Source: Ambulatory Visit | Attending: General Surgery | Admitting: General Surgery

## 2019-09-27 DIAGNOSIS — N6320 Unspecified lump in the left breast, unspecified quadrant: Secondary | ICD-10-CM

## 2019-10-16 MED ORDER — LISINOPRIL 20 MG-HYDROCHLOROTHIAZIDE 12.5 MG TABLET
20-12.5 | ORAL_TABLET | ORAL | 4 refills | 90.00000 days | Status: AC
Start: 2019-10-16 — End: 2020-10-15

## 2019-10-16 MED ORDER — SIMVASTATIN 20 MG TABLET
20 | ORAL_TABLET | ORAL | 4 refills | 90.00000 days | Status: AC
Start: 2019-10-16 — End: 2020-10-15

## 2019-10-16 MED ORDER — METFORMIN IMMEDIATE RELEASE 850 MG TABLET
850 | ORAL_TABLET | ORAL | 4 refills | 90.00000 days | Status: AC
Start: 2019-10-16 — End: 2020-10-15

## 2019-11-06 NOTE — Progress Notes (Deleted)
SURVIVORSHIP VISIT:  BRIEF ONCOLOGIC HISTORY:  Oncology History  Malignant neoplasm of upper-inner quadrant of left breast in female, estrogen receptor positive ([place])  07/18/2018 Initial Diagnosis   Screening detected left breast mass at 10 o'clock position 1.8 cm axilla negative, biopsy revealed grade 3 IDC ER 80%, PR 70%, Ki-67 40%, HER-2 +3+ by IHC with heterogeneity, T1c N0 stage Ia clinical stage   08/15/2018 Surgery   Left Lumpectomy Dalbert Batman) [phone]): IDC grade 3, 1.9 cm, Posterior margin positive (Muscle) due to a transected satellite nodule 0.5 cm, 3 lymph nodes negative for carcinoma, ER 80%, PR 70%, Ki-67 40%, HER-2 +3+ by IHC with heterogeneity T1cN0 Stage 1A.    08/24/2018 Cancer Staging   Staging form: Breast, AJCC 8th Edition - Pathologic: Stage IA (pT1b, pN0, cM0, G3, ER+, PR+, HER2+)     09/09/2018 - 08/03/2019 Chemotherapy   trastuzumab (HERCEPTIN) 300 mg in sodium chloride 0.9 % 250 mL chemo infusion, 294 mg, Intravenous,  Once, 8 of 8 cycles. Administration: 300 mg (09/09/2018), 150 mg (09/19/2018), 150 mg (10/10/2018), 450 mg (12/19/2018), 450 mg (01/09/2019), 450 mg (03/13/2019), 150 mg (09/26/2018), 150 mg (10/03/2018), 150 mg (10/17/2018), 150 mg (10/24/2018), 150 mg (10/31/2018), 150 mg (11/07/2018), 150 mg (11/14/2018), 150 mg (11/21/2018), 450 mg (11/28/2018), 450 mg (01/30/2019), 450 mg (02/20/2019)  PACLitaxel (TAXOL) 150 mg in sodium chloride 0.9 % 250 mL chemo infusion (</= 67m/m2), 80 mg/m2 = 150 mg, Intravenous,  Once, 3 of 3 cycles. Dose modification: 60 mg/m2 (original dose 80 mg/m2, Cycle 2, Reason: Dose not tolerated). Administration: 150 mg (09/09/2018), 150 mg (09/19/2018), 150 mg (10/10/2018), 150 mg (09/26/2018), 150 mg (10/03/2018), 114 mg (10/17/2018), 114 mg (10/24/2018), 114 mg (10/31/2018), 114 mg (11/07/2018), 114 mg (11/14/2018), 114 mg (11/21/2018), 114 mg (11/28/2018)  trastuzumab-dkst (OGIVRI) 450 mg in sodium chloride 0.9 % 250 mL chemo infusion, 450 mg (100 % of original dose 450  mg), Intravenous,  Once, 7 of 7 cycles. Dose modification: 450 mg (original dose 450 mg, Cycle 9, Reason: Other (see comments), Comment: Biosimilar Conversion). Administration: 450 mg (04/03/2019), 450 mg (04/24/2019), 450 mg (05/15/2019), 450 mg (06/05/2019), 450 mg (06/26/2019), 450 mg (07/17/2019), 450 mg (08/03/2019)    01/16/2019 - 02/13/2019 Radiation Therapy   The patient initially received a dose of 40.05 Gy in 15 fractions to the breast using whole-breast tangent fields. This was delivered using a 3-D conformal technique. The pt received a boost delivering an additional 10 Gy in 5 fractions using a electron boost with 1109m electrons. The total dose was 50.05 Gy.    08/2019 - 08/2024 Anti-estrogen oral therapy   Anastrozole     INTERVAL HISTORY:  Ms. PaSantarellio review her survivorship care plan detailing her treatment course for breast cancer, as well as monitoring long-term side effects of that treatment, education regarding health maintenance, screening, and overall wellness and health promotion.     Overall, Ms. PaBagenteports feeling quite well   REVIEW OF SYSTEMS:  Review of Systems  Constitutional: Negative for appetite change, chills, fatigue, fever and unexpected weight change.  HENT:   Negative for hearing loss, lump/mass, sore throat and trouble swallowing.   Eyes: Negative for eye problems and icterus.  Respiratory: Negative for chest tightness, cough and shortness of breath.   Cardiovascular: Negative for chest pain, leg swelling and palpitations.  Gastrointestinal: Negative for abdominal distention, abdominal pain, constipation, diarrhea, nausea and vomiting.  Endocrine: Negative for hot flashes.  Musculoskeletal: Negative for arthralgias.  Skin: Negative for itching and rash.  Neurological: Negative for dizziness, extremity weakness, headaches and numbness.  Hematological: Negative for adenopathy. Does not bruise/bleed easily.  Psychiatric/Behavioral: Negative for depression.  The patient is not nervous/anxious.    Breast: Denies any new nodularity, masses, tenderness, nipple changes, or nipple discharge.      ONCOLOGY TREATMENT TEAM:  1. Surgeon:  Dr. Dalbert Batman at [facility] 2. Medical Oncologist: Dr. Lindi Adie  3. Radiation Oncologist: Dr. Isidore Moos    PAST MEDICAL/SURGICAL HISTORY:  Past Medical History:  Diagnosis Date  . Cancer ([place])   . GERD (gastroesophageal reflux disease)   . History of radiation therapy 01/16/19- 02/13/19   left breast 15 fractions of 2.67 Gy for a total of 40.05 Gy. Left breast boost 5 fractions of 2 Gy for a total of 10 Gy  . Hypertension   . Mixed hyperlipidemia   . OA (osteoarthritis)    knee right  . Peripheral neuropathy   . PVD (peripheral vascular disease) ([place])   . Type 2 diabetes mellitus treated with insulin [facility])    endocrinologist--- dr Providence Crosby Sumners   Past Surgical History:  Procedure Laterality Date  . BREAST BIOPSY Bilateral 2000   benign  . BREAST LUMPECTOMY WITH RADIOACTIVE SEED AND SENTINEL LYMPH NODE BIOPSY Left 08/15/2018   Procedure: LEFT BREAST LUMPECTOMY WITH RADIOACTIVE SEED AND LEFT AXILLARY DEEP SENTINEL LYMPH NODE BIOPSY INJECT BLUE DYE LEFT BREAST;  Surgeon: Fanny Skates, MD;  Location: [place];  Service: General;  Laterality: Left;  . PORT-A-CATH REMOVAL Right 08/09/2019   Procedure: REMOVAL PORT-A-CATH;  Surgeon: Fanny Skates, MD;  Location: [place];  Service: General;  Laterality: Right;  . PORTACATH PLACEMENT Right 08/15/2018   Procedure: INSERTION PORT-A-CATH WITH ULTRASOUND;  Surgeon: Fanny Skates, MD;  Location: [place];  Service: General;  Laterality: Right;  . SHOULDER ARTHROSCOPY Right 06-16-2002   dr graves  . SHOULDER OPEN ROTATOR CUFF REPAIR Right 07-28-2000    dr Berenice Primas  . VAGINAL HYSTERECTOMY  1992   with BSO  . WRIST SURGERY  2007     ALLERGIES:  Allergies  Allergen Reactions  . Penicillins Swelling and Other  (See Comments)    Has patient had a PCN reaction causing immediate rash, facial/tongue/throat swelling, SOB or lightheadedness with hypotension: Yes Has patient had a PCN reaction causing severe rash involving mucus membranes or skin necrosis: No Has patient had a PCN reaction that required hospitalization: No Has patient had a PCN reaction occurring within the last 10 years: No If all of the above answers are "NO", then may proceed with Cephalosporin use.   . Cephalexin Itching  . Statins Other (See Comments)    MYALGIAS     CURRENT MEDICATIONS:  Outpatient Encounter Medications as of 11/07/2019  Medication Sig  . anastrozole (ARIMIDEX) 1 MG tablet Take 1 tablet (1 mg total) by mouth daily. (Patient not taking: Reported on 07/17/2019)  . aspirin EC 81 MG tablet Take 81 mg by mouth daily.  . Calcium Carb-Cholecalciferol (CALCIUM + VITAMIN D3 PO) Take 1 tablet by mouth daily.  . Empagliflozin-metFORMIN HCl (SYNJARDY) 12.12-998 MG TABS Take 1 tablet by mouth 2 (two) times daily.  Marland Kitchen EXFORGE 5-320 MG tablet Take 1 tablet by mouth daily.  . hydrochlorothiazide (HYDRODIURIL) 25 MG tablet Take 25 mg by mouth daily.  Marland Kitchen lidocaine-prilocaine (EMLA) cream Apply to affected area once  . omeprazole (PRILOSEC) 40 MG capsule Take 40 mg by mouth every morning.   . simvastatin (ZOCOR) 40 MG tablet Take  40 mg by mouth daily.   Facility-Administered Encounter Medications as of 11/07/2019  Medication  . sodium chloride flush (NS) 0.9 % injection 10 mL     ONCOLOGIC FAMILY HISTORY:  Family History  Problem Relation Age of Onset  . Hypertension Mother   . CVA Mother   . Heart disease Mother   . Asthma Father   . Hypertension Sister   . Diabetes Sister   . Heart disease Brother   . Hypertension Sister   . Diabetes Sister   . Hypertension Sister   . Diabetes Sister   . Healthy Son   . Healthy Daughter   . Healthy Daughter   . Healthy Daughter   . Breast cancer Neg Hx      GENETIC  COUNSELING/TESTING: Not at this time  SOCIAL HISTORY:  Social History   Socioeconomic History  . Marital status: Married    Spouse name: Not on file  . Number of children: 4  . Years of education: Not on file  . Highest education level: Not on file  Occupational History  . Not on file  Tobacco Use  . Smoking status: Never Smoker  . Smokeless tobacco: Never Used  Substance and Sexual Activity  . Alcohol use: Never  . Drug use: Never  . Sexual activity: Not on file  Other Topics Concern  . Not on file  Social History Narrative   Lives with husband and son.     Social Determinants of Health   Financial Resource Strain:   . Difficulty of Paying Living Expenses:   Food Insecurity:   . Worried About Charity fundraiser in the Last Year:   . Arboriculturist in the Last Year:   Transportation Needs: No Transportation Needs  . Lack of Transportation (Medical): No  . Lack of Transportation (Non-Medical): No  Physical Activity:   . Days of Exercise per Week:   . Minutes of Exercise per Session:   Stress:   . Feeling of Stress :   Social Connections:   . Frequency of Communication with Friends and Family:   . Frequency of Social Gatherings with Friends and Family:   . Attends Religious Services:   . Active Member of Clubs or Organizations:   . Attends Archivist Meetings:   Marland Kitchen Marital Status:   Intimate Partner Violence:   . Fear of Current or Ex-Partner:   . Emotionally Abused:   Marland Kitchen Physically Abused:   . Sexually Abused:      OBSERVATIONS/OBJECTIVE:  There were no vitals taken for this visit. GENERAL: Patient is a well appearing female in no acute distress HEENT:  Sclerae anicteric.  Oropharynx clear and moist. No ulcerations or evidence of oropharyngeal candidiasis. Neck is supple.  NODES:  No cervical, supraclavicular, or axillary lymphadenopathy palpated.  BREAST EXAM:  Deferred. LUNGS:  Clear to auscultation bilaterally.  No wheezes or rhonchi. HEART:   Regular rate and rhythm. No murmur appreciated. ABDOMEN:  Soft, nontender.  Positive, normoactive bowel sounds. No organomegaly palpated. MSK:  No focal spinal tenderness to palpation. Full range of motion bilaterally in the upper extremities. EXTREMITIES:  No peripheral edema.   SKIN:  Clear with no obvious rashes or skin changes. No nail dyscrasia. NEURO:  Nonfocal. Well oriented.  Appropriate affect.    LABORATORY DATA:  None for this visit.  DIAGNOSTIC IMAGING:  None for this visit.      ASSESSMENT AND PLAN:  Ms.. Gloria Mckinney is a pleasant 74 y.o. female  with Stage IA left breast invasive ductal carcinoma, ER+/PR+/HER2+, diagnosed in 07/2018, treated with lumpectomy, adjuvant chemotherapy, maintenance trastuzumab, adjuvant radiation therapy, and anti-estrogen therapy with Anastrozole beginning in 08/2019.  She presents to the Survivorship Clinic for our initial meeting and routine follow-up post-completion of treatment for breast cancer.    1. Stage IA left breast cancer:  Ms. Trafton is continuing to recover from definitive treatment for breast cancer. She will follow-up with her medical oncologist, Dr. Ross Ludwig in *** with history and physical exam per surveillance protocol.  She will continue her anti-estrogen therapy with Anastrozole. Thus far, she is tolerating the Anastrozole well, with minimal side effects. She was instructed to make Dr. Lindi Adie or myself aware if she begins to experience any worsening side effects of the medication and I could see her back in clinic to help manage those side effects, as needed. Her mammogram is due ***; orders placed today.  Her breast density is category ***. Today, a comprehensive survivorship care plan and treatment summary was reviewed with the patient today detailing her breast cancer diagnosis, treatment course, potential late/long-term effects of treatment, appropriate follow-up care with recommendations for the future, and patient education  resources.  A copy of this summary, along with a letter will be sent to the patient's primary care provider via mail/fax/In Basket message after today's visit.    #. Problem(s) at Visit______________  #. Bone health:  Given Ms. Gethers's age/history of breast cancer and her current treatment regimen including anti-estrogen therapy with Anastrozole, she is at risk for bone demineralization.  Her last DEXA scan was ***, which showed ***.  In the meantime, she was encouraged to increase her consumption of foods rich in calcium, as well as increase her weight-bearing activities.  She was given education on specific activities to promote bone health.  #. Cancer screening:  Due to Ms. Goucher's history and her age, she should receive screening for skin cancers, colon cancer, and gynecologic cancers.  The information and recommendations are listed on the patient's comprehensive care plan/treatment summary and were reviewed in detail with the patient.    #. Health maintenance and wellness promotion: Ms. Corker was encouraged to consume 5-7 servings of fruits and vegetables per day. We reviewed the "Nutrition Rainbow" handout, as well as the handout "Take Control of Your Health and Reduce Your Cancer Risk" from the [place].  She was also encouraged to engage in moderate to vigorous exercise for 30 minutes per day most days of the week. We discussed the [employer] fitness program, which is designed for cancer survivors to help them become more physically fit after cancer treatments.  She was instructed to limit her alcohol consumption and continue to abstain from tobacco use/***was encouraged stop smoking.     #. Support services/counseling: It is not uncommon for this period of the patient's cancer care trajectory to be one of many emotions and stressors.  We discussed how this can be increasingly difficult during the times of quarantine and social distancing due to the COVID-19 pandemic.   She was  given information regarding our available services and encouraged to contact me with any questions or for help enrolling in any of our support group/programs.    Follow up instructions:    -Return to cancer center 6 months for f/u with Dr. Lindi Adie   -Mammogram due in *** -Follow up with surgery *** -She is welcome to return back to the Survivorship Clinic at any time; no additional follow-up needed at  this time.  -Consider referral back to survivorship as a long-term survivor for continued surveillance  The patient was provided an opportunity to ask questions and all were answered. The patient agreed with the plan and demonstrated an understanding of the instructions.   Total encounter time: *** minutes  Wilber Bihari, NP 11/06/19 2:03 PM Medical Oncology and Hematology [facility] [place], [place] [address] Tel. [phone]    Fax. [phone]  *Total Encounter Time as defined by the Centers for Medicare and Medicaid Services includes, in addition to the face-to-face time of a patient visit (documented in the note above) non-face-to-face time: obtaining and reviewing outside history, ordering and reviewing medications, tests or procedures, care coordination (communications with other health care professionals or caregivers) and documentation in the medical record.

## 2019-11-07 ENCOUNTER — Inpatient Hospital Stay: Payer: Medicare Other | Attending: Adult Health | Admitting: Adult Health

## 2019-11-15 MED ORDER — VOLTAREN 1 % TOPICAL GEL
1 | Freq: Four times a day (QID) | TOPICAL | 4 refills | 25.00000 days | Status: AC
Start: 2019-11-15 — End: 2020-01-22

## 2019-12-05 ENCOUNTER — Encounter: Payer: Self-pay | Admitting: Family Medicine

## 2020-01-18 NOTE — Progress Notes (Signed)
HEMATOLOGY-ONCOLOGY [facility] VIDEO VISIT PROGRESS NOTE  I connected with Gloria Mckinney on 01/19/2020 at 11:45 AM EDT by MyChart video conference and verified that I am speaking with the correct person using two identifiers.  I discussed the limitations, risks, security and privacy concerns of performing an evaluation and management service by MyChart and the availability of in person appointments.  I also discussed with the patient that there may be a patient responsible charge related to this service. The patient expressed understanding and agreed to proceed.  Patient's Location: Home Physician Location: Clinic  CHIEF COMPLIANT: Follow-up of left breast cancer on anastrozole   INTERVAL HISTORY: Gloria Mckinney is a 74 y.o. female with above-mentioned history of left breast cancer treated with lumpectomy,adjuvant chemotherapy, radiation, Herceptin maintenance, and who is currently on antiestrogen therapy with anastrozole. Mammogram on 09/01/19 showed a 0.9cm group of calcifications at the left breast lumpectomy site. Biopsy on 09/11/19 showed fibrosis and calcifications with no evidence of malignancy. She presents over MyChart today for follow-up.   Oncology History  Malignant neoplasm of upper-inner quadrant of left breast in female, estrogen receptor positive ([place])  07/18/2018 Initial Diagnosis   Screening detected left breast mass at 10 o'clock position 1.8 cm axilla negative, biopsy revealed grade 3 IDC ER 80%, PR 70%, Ki-67 40%, HER-2 +3+ by IHC with heterogeneity, T1c N0 stage Ia clinical stage   08/15/2018 Surgery   Left Lumpectomy Dalbert Batman) [phone]): IDC grade 3, 1.9 cm, Posterior margin positive (Muscle) due to a transected satellite nodule 0.5 cm, 3 lymph nodes negative for carcinoma, ER 80%, PR 70%, Ki-67 40%, HER-2 +3+ by IHC with heterogeneity T1cN0 Stage 1A.    08/24/2018 Cancer Staging   Staging form: Breast, AJCC 8th Edition - Pathologic: Stage IA (pT1b,  pN0, cM0, G3, ER+, PR+, HER2+)     09/09/2018 - 08/03/2019 Chemotherapy   trastuzumab (HERCEPTIN) 300 mg in sodium chloride 0.9 % 250 mL chemo infusion, 294 mg, Intravenous,  Once, 8 of 8 cycles. Administration: 300 mg (09/09/2018), 150 mg (09/19/2018), 150 mg (10/10/2018), 450 mg (12/19/2018), 450 mg (01/09/2019), 450 mg (03/13/2019), 150 mg (09/26/2018), 150 mg (10/03/2018), 150 mg (10/17/2018), 150 mg (10/24/2018), 150 mg (10/31/2018), 150 mg (11/07/2018), 150 mg (11/14/2018), 150 mg (11/21/2018), 450 mg (11/28/2018), 450 mg (01/30/2019), 450 mg (02/20/2019)  PACLitaxel (TAXOL) 150 mg in sodium chloride 0.9 % 250 mL chemo infusion (</= 67m/m2), 80 mg/m2 = 150 mg, Intravenous,  Once, 3 of 3 cycles. Dose modification: 60 mg/m2 (original dose 80 mg/m2, Cycle 2, Reason: Dose not tolerated). Administration: 150 mg (09/09/2018), 150 mg (09/19/2018), 150 mg (10/10/2018), 150 mg (09/26/2018), 150 mg (10/03/2018), 114 mg (10/17/2018), 114 mg (10/24/2018), 114 mg (10/31/2018), 114 mg (11/07/2018), 114 mg (11/14/2018), 114 mg (11/21/2018), 114 mg (11/28/2018)  trastuzumab-dkst (OGIVRI) 450 mg in sodium chloride 0.9 % 250 mL chemo infusion, 450 mg (100 % of original dose 450 mg), Intravenous,  Once, 7 of 7 cycles. Dose modification: 450 mg (original dose 450 mg, Cycle 9, Reason: Other (see comments), Comment: Biosimilar Conversion). Administration: 450 mg (04/03/2019), 450 mg (04/24/2019), 450 mg (05/15/2019), 450 mg (06/05/2019), 450 mg (06/26/2019), 450 mg (07/17/2019), 450 mg (08/03/2019)    01/16/2019 - 02/13/2019 Radiation Therapy   The patient initially received a dose of 40.05 Gy in 15 fractions to the breast using whole-breast tangent fields. This was delivered using a 3-D conformal technique. The pt received a boost delivering an additional 10 Gy in 5 fractions using a electron boost with 183m electrons. The  total dose was 50.05 Gy.    08/2019 - 08/2024 Anti-estrogen oral therapy   Anastrozole     Observations/Objective:  There were no  vitals filed for this visit. There is no height or weight on file to calculate BMI.  I have reviewed the data as listed CMP Latest Ref Rng & Units 08/03/2019 06/26/2019 05/15/2019  Glucose 70 - 99 mg/dL 245(H) 251(H) 149(H)  BUN 8 - 23 mg/dL _0 Creatinine 0.44 - 1.00 mg/dL 0.92 1.01(H) 0.89  Sodium 135 - 145 mmol/L 139 137 136  Potassium 3.5 - 5.1 mmol/L 4.3 4.7 4.1  Chloride 98 - 111 mmol/L 103 102 103  CO2 22 - 32 mmol/L 21(L) 23 23  Calcium 8.9 - 10.3 mg/dL 9.8 10.2 9.8  Total Protein 6.5 - 8.1 g/dL 7.1 7.3 7.2  Total Bilirubin 0.3 - 1.2 mg/dL 0.3 0.3 0.4  Alkaline Phos 38 - 126 U/L 93 91 90  AST 15 - 41 U/L _1 ALT 0 - 44 U/L _2 Lab Results  Component Value Date   WBC 7.4 08/03/2019   HGB 12.8 08/03/2019   HCT 37.1 08/03/2019   MCV 82.3 08/03/2019   PLT 222 08/03/2019   NEUTROABS 4.5 08/03/2019      Assessment Plan:  Malignant neoplasm of upper-inner quadrant of left breast in female, estrogen receptor positive ([facility]) 08/15/18:Left Lumpectomy: IDC grade 3, 1.9 cm, Posterior margin positive (Muscle) due to a transected satellite nodule 0.5 cm, 0/3 LN Neg, ER 80%, PR 70%, Ki-67 40%, HER-2 +3+ by IHC with heterogeneity T1cN0 Stage 1A  Posterior Margin: based on discussions with surgery, it is muscle and hence no further surgery is possible.  Plan:  1.Adjuvant Taxol Herceptin weekly x12completed 4/20/2020followed by Herceptin maintenance for 1 yearto be completed12/24/2020 2.Adjuvant radiation therapycompleted 02/13/2019 3.Followed by adjuvant antiestrogen therapyto start January 2021 --------------------------------------------------------------------------------------------------------------------------------------------------------- Current treatment: Adjuvant Herceptin completed12/24/2020, adjuvant anastrozole started January 2021 Adjuvant radiationcompleted 02/13/2019 Echocardiogram 11/29/2018: EF 55 to 60%  Iron deficiencyanemia:IV  iron given 9/14/2020currently on oral iron which will be completed in December 2020 hemoglobin improved to 13.7  Anastrozole toxicities: tolerating it well. No side effects  Breast cancer surveillance: 1.  Breast exam 01/18/2020: Benign 2.  Mammogram: Indeterminate 9 mm left breast calcifications February 2021 stereotactic biopsy 10/09/2019: Benign: Fibrosis Next mammogram in Aug  Return to clinic in 1 year for follow-up    I discussed the assessment and treatment plan with the patient. The patient was provided an opportunity to ask questions and all were answered. The patient agreed with the plan and demonstrated an understanding of the instructions. The patient was advised to call back or seek an in-person evaluation if the symptoms worsen or if the condition fails to improve as anticipated.   I provided 20 minutes of face-to-face MyChart video and for charting  visit time during this encounter.    Rulon Eisenmenger, MD 01/19/2020   I, Molly Dorshimer, am acting as scribe for Nicholas Lose, MD.  I have reviewed the above documentation for accuracy and completeness, and I agree with the above.

## 2020-01-19 ENCOUNTER — Inpatient Hospital Stay: Payer: Medicare Other | Attending: Adult Health | Admitting: Hematology and Oncology

## 2020-01-19 DIAGNOSIS — Z170 Estrogen receptor positive status [ER+]: Secondary | ICD-10-CM

## 2020-01-19 DIAGNOSIS — C50212 Malignant neoplasm of upper-inner quadrant of left female breast: Secondary | ICD-10-CM

## 2020-01-19 NOTE — Assessment & Plan Note (Signed)
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  Plan:  1.Adjuvant Taxol Herceptin weekly x12completed 4/20/2020followed by Herceptin maintenance for 1 yearto be completed12/24/2020 2.Adjuvant radiation therapycompleted 02/13/2019 3.Followed by adjuvant antiestrogen therapyto start January 2021 --------------------------------------------------------------------------------------------------------------------------------------------------------- Current treatment: Adjuvant Herceptin completed12/24/2020, adjuvant anastrozole started January 2021 Adjuvant radiationcompleted 02/13/2019 Echocardiogram 11/29/2018: EF 55 to 60%  Iron deficiencyanemia:IV iron given 9/14/2020currently on oral iron which will be completed in December 2020 hemoglobin improved to 13.7  Anastrozole toxicities:  Breast cancer surveillance: 1.  Breast exam 01/18/2020: Benign 2.  Mammogram: Indeterminate 9 mm left breast calcifications February 2021 stereotactic biopsy 10/09/2019: Benign: Fibrosis  Return to clinic in 1 year for follow-up

## 2020-01-22 MED ORDER — DICLOFENAC 1 % TOPICAL GEL
1 | TOPICAL | 3 refills | 25.00000 days | Status: AC
Start: 2020-01-22 — End: 2020-02-27

## 2020-01-24 ENCOUNTER — Telehealth: Payer: Medicare Other | Admitting: Adult Health

## 2020-01-25 ENCOUNTER — Telehealth: Payer: Self-pay | Admitting: Hematology and Oncology

## 2020-01-25 NOTE — Telephone Encounter (Signed)
Scheduled per 06/11 los, spoke with patient's daughter and patient will be notified of upcoming appointment.

## 2020-02-07 ENCOUNTER — Inpatient Hospital Stay: Admit: 2020-02-07 | Discharge: 2020-02-07 | Payer: MEDICARE | Primary: Internal Medicine

## 2020-02-07 DIAGNOSIS — G8929 Other chronic pain: Secondary | ICD-10-CM

## 2020-02-07 DIAGNOSIS — M25562 Pain in left knee: Secondary | ICD-10-CM

## 2020-02-09 ENCOUNTER — Other Ambulatory Visit: Payer: Self-pay | Admitting: Family Medicine

## 2020-02-09 DIAGNOSIS — E2839 Other primary ovarian failure: Secondary | ICD-10-CM

## 2020-02-13 NOTE — Other
Please notify patient she has progressive arthritis of the left knee.  Consult with orthopedic surgery for guidance.

## 2020-02-13 NOTE — Other
Spoke to pt, her daughter will call back for results.

## 2020-02-14 NOTE — Other
Left message to call back  

## 2020-02-15 NOTE — Other
Spoke to pts granddaughter by [employer], aware of results.

## 2020-02-26 MED ORDER — DICLOFENAC 1 % TOPICAL GEL
1 | TOPICAL | 3 refills | 25.00000 days | Status: AC
Start: 2020-02-26 — End: 2020-02-28

## 2020-02-27 ENCOUNTER — Encounter
Admit: 2020-02-27 | Payer: PRIVATE HEALTH INSURANCE | Attending: Physical Medicine and Rehabilitation | Primary: Internal Medicine

## 2020-02-27 ENCOUNTER — Ambulatory Visit: Admit: 2020-02-27 | Payer: MEDICARE | Attending: Physical Medicine and Rehabilitation | Primary: Internal Medicine

## 2020-02-27 DIAGNOSIS — M1712 Unilateral primary osteoarthritis, left knee: Secondary | ICD-10-CM

## 2020-02-27 DIAGNOSIS — M25561 Pain in right knee: Secondary | ICD-10-CM

## 2020-02-27 DIAGNOSIS — E785 Hyperlipidemia, unspecified: Secondary | ICD-10-CM

## 2020-02-27 DIAGNOSIS — I10 Essential (primary) hypertension: Secondary | ICD-10-CM

## 2020-02-27 DIAGNOSIS — M25562 Pain in left knee: Secondary | ICD-10-CM

## 2020-02-27 MED ORDER — TRIAMCINOLONE ACETONIDE 40 MG/ML SUSPENSION FOR INJECTION
40 mg/mL | Freq: Once | INTRA_ARTICULAR | Status: CP | PRN
Start: 2020-02-27 — End: ?
  Administered 2020-02-27: 14:00:00 40 mg/mL via INTRA_ARTICULAR

## 2020-02-27 NOTE — Progress Notes
[place] [facility]Department of [email or web address] Name:  Kristina Vasquez Patient MRN:  [id number] DOB:  08-23-1947Provider:  Dr. Inez Catalina Referring Provider:  PatelPrimary Care Physician:  Mallie Snooks [facility] Complaint Patient presents with ? New Patient   Pain on left knee that started one a1.5 month   Subjective:Pt is a 74 y/o female presenting for left knee pain. Pt has had a TKR of the R knee in 2016 and presents today to discuss degenerative changes and osteoarthritis of the left knee. Pt states the pain is mainly on the back of the knee but denies locking or swelling. She has pain when she walks long distances. Kristina Vasquez is a 74 y.o. year old female who presents to the clinic today with chief complaint(s) as above. Onset/Duration: Left knee pain progressed 1 month ago Location: Back of the kneeDescription:  Numbness/Tingling:  [ ]  YES [x ] NO Weakness:  [ ]  YES [x ] NO Better with: Advil Worse with: Walking long periods of time Fever/Chills:  [ ]  YES [ x] NO Bladder/Bowel Incontinence:    [ ]  YES [x ] NO Prior Related Treatments:                  PT (for this condition):  [ ]  YES [x ] NO                  Chiropractic:  [ ]  YES [ ]  NO                 Medications: Advil Other: Prior Related Consults:  [ ]  YES [ x] NO  1.            nonePrior Related Procedures/Surgery:Date / Procedure / Relief (%)1.            TKR R Knee 07/2015 Work/Functional StatusAble to ambulate and perform ADL's without devices:  [x ] YES [ ]  NO Current occupation/duties:  ===Is this visit directly related to a work or auto injury?   [ ]  YES [ x] NO If so, pre-Injury Symptoms:    n/a===Past Medical History: Diagnosis Date ? Hypertension  ? Other and unspecified hyperlipidemia 11/06/2014 ? Pain in left knee 08/09/2014 ? Pain in right knee 09/27/2014 Past Surgical History: Procedure Laterality Date ? CATARACT EXTRACTION Bilateral 2013 ? JOINT REPLACEMENT   ? TOTAL KNEE ARTHROPLASTY Right 07/2015 Current Medications Taking:Current Outpatient Medications Medication Sig ? blood sugar diagnostic 100 each by Other route daily. Once daily E11.69 E78.9 ? blood-glucose meter 1 each by Other route See Admin Instructions. Once Daily E11.69 E78.5 ? calcium carbonate-vitamin D3 Take 1 tablet by mouth 2 (two) times daily with breakfast and dinner ? diclofenac Apply 2 grams to affected area 4 times daily. ? lancets 100 each by Misc.(Non-Drug; Combo Route) route daily. E11.69 E78.5 ? lisinopriL-hydrochlorothiazide Take 1 tablet by mouth once daily ? metFORMIN TAKE 1 TABLET BY MOUTH TWICE DAILY WITH BREAKFAST AND WITH SUPPER ? multivitamin Take by mouth. ? simvastatin Take 1 tablet by mouth once daily No current facility-administered medications for this visit.   Allergies Allergen Reactions ? No Known Allergies  Family History Problem Relation Age of Onset ? No Known Problems Mother  ? No Known Problems Father  ? No Known Problems Sister  ? No Known Problems Brother  ? No Known Problems Maternal Grandmother  ? No Known Problems Maternal Grandfather  ? No Known Problems Paternal Grandmother  ? No Known Problems Paternal Grandfather  ? No Known Problems Cousin  Social History Tobacco Use ?  Smoking status: Never Smoker ? Smokeless tobacco: Never Used Substance Use Topics ? Alcohol use: No ? Drug use: No Review of Systems:Review of Systems Constitutional: Negative for chills, fever and malaise/fatigue. Respiratory: Negative for cough, shortness of breath and wheezing.  Cardiovascular: Negative for chest pain and palpitations. Gastrointestinal: Negative for diarrhea, nausea and vomiting. Genitourinary: Negative for frequency and urgency. Musculoskeletal: Positive for joint pain (bilateral knees). 14 point ROS reviewed and pertinent positives listed above, the rest are reviewed and confirmed to be negative.Objective:Vital Signs: Blood pressure 114/78, pulse 78, weight 59 kg, SpO2 98 %.Physical Exam:Gen: NAD.ConversantEyes: anicteric sclerae, moist conjunctivae, no lid lagHENT: Atraumatic. No lesions, masses or scarsCV: No gross edema noted. No cyanosis. Respiratory: Non-labored breathing, good respiratory effort. Skin:Visualized portions intact and no rashes or ecchymosis noted.Neuro/Psych: Alert and well-oriented. Mood/Affect: appropriate.Insight and judgement normalNeuro/MSK: Knee Musculoskeletal ExamInspection  Inspection - Left    Effusion: none      Edema: mild      Deformity: none  Palpation  Palpation - Left    Tenderness: present      Tenderness comment: lateral posterior Range of Motion  Range of Motion - Left    Left knee range of motion is normal and full.  Strength  Strength - Left    Left knee strength is normal.  Instability  Instability Signs - Left    Varus stress grade: normal    Valgus stress grade: normal    Lachman: negativeSpecial Signs  Special Signs - Left Knee    Patellar compression: mild  Diagnostic Imaging: XR Knee R 07/27/2015: Right knee replacement no complicationXR Knee R 08/13/2015: Satisfactory right total knee replacement.XR Knee R 10/24/2015:Stable examination right knee in this patient status post TKR. XR Knee L 07/30/2016: Some osteoarthritic changes as describedXR Knee L 02/07/2020: Degenerative change progressed since previousOther Diagnostic Studies:IMPRESSION:  ICD-10-CM  1. Primary osteoarthritis of left knee  M17.12  Kristina Vasquez is a 74 y.o. female here with the following issues:Chronic left knee pain due to severe OA  XR showed medial joint moderate joint space narrowing with hypertrophic osteophytes. She does have a R TKR L knee IA steroid injection done in clinic today and will see how she does with the injection in 4 weeks Large Joint Injection: L knee on 02/27/2020 9:30 AMIndications: pain and diagnostic evaluationDetails: 22 G needle, ultrasound-guided superior approachLocal anesthetic:  Lidocaine 2%Anesthetic total (ml):  : 40 mg triamcinolone acetonide 40 mg/mLOutcome: tolerated well, no immediate complicationsProcedure, treatment alternatives, risks and benefits explained, specific risks discussed. Consent was given by the patient. Immediately prior to procedure a time out was called to verify the correct patient, procedure, equipment, support staff and site/side marked as required. Patient was prepped and draped in the usual sterile fashion. PLAN:-Recommend:  The risks, benefits, alternative treatment options and prognosis were discussed and all of patient's questions/concerns were addressed.  Follow-up:   Follow up in about 4 weeks after injection May certainly follow up sooner if needed.  Scribed for Inez Catalina, MD by Kennieth Rad, medical scribe July 20, 2021The documentation recorded by the scribe accurately reflects the services I personally performed and the decisions made by me. I reviewed and confirmed all material entered and/or pre-charted by the scribe.Inez Catalina DOAssistant Professor of Clinical Orthopaedics and [employer] of MedicineDept of Orthopaedics & Rehabilitation Division of Physical Medicine & Rehabilitation

## 2020-02-28 MED ORDER — DICLOFENAC 1 % TOPICAL GEL
1 | TOPICAL | 3 refills | 25.00000 days | Status: AC
Start: 2020-02-28 — End: 2020-12-03

## 2020-03-26 ENCOUNTER — Encounter: Admit: 2020-03-26 | Payer: MEDICARE | Attending: Physical Medicine and Rehabilitation | Primary: Internal Medicine

## 2020-03-26 ENCOUNTER — Encounter
Admit: 2020-03-26 | Payer: PRIVATE HEALTH INSURANCE | Attending: Physical Medicine and Rehabilitation | Primary: Internal Medicine

## 2020-03-26 DIAGNOSIS — M25561 Pain in right knee: Secondary | ICD-10-CM

## 2020-03-26 DIAGNOSIS — M25562 Pain in left knee: Secondary | ICD-10-CM

## 2020-03-26 DIAGNOSIS — M1712 Unilateral primary osteoarthritis, left knee: Secondary | ICD-10-CM

## 2020-03-26 DIAGNOSIS — E785 Hyperlipidemia, unspecified: Secondary | ICD-10-CM

## 2020-03-26 DIAGNOSIS — I10 Essential (primary) hypertension: Secondary | ICD-10-CM

## 2020-03-26 NOTE — Progress Notes
[place] [facility]Department of [email or web address] Name:  Kristina Vasquez Patient MRN:  [id number] DOB:  08-17-1947Provider:  Dr. Berkley Harvey Provider:  TokarzPrimary Care Physician:  [place], [place] [facility] Complaint Patient presents with ? Follow-up  FOLLOW-UPSubjective:Kristina Vasquez is a 74 y.o. year old female who presents to the clinic today with chief complaint(s) as above. Patient was last seen July 20 for chronic left knee pain and severe OA.  Performed an left intra-articular knee injection and follow up on her response to it today. She had a right total knee replacement. Patient reports left knee feels well. Following up for: Left intra-articular knee injectionResponse to treatment recommendations: improved post injection===Past Medical History: Diagnosis Date ? Hypertension  ? Other and unspecified hyperlipidemia 11/06/2014 ? Pain in left knee 08/09/2014 ? Pain in right knee 09/27/2014 Past Surgical History: Procedure Laterality Date ? CATARACT EXTRACTION Bilateral 2013 ? JOINT REPLACEMENT   ? TOTAL KNEE ARTHROPLASTY Right 07/2015 Current Medications Taking:Current Outpatient Medications Medication Sig ? blood sugar diagnostic 100 each by Other route daily. Once daily E11.69 E78.9 ? blood-glucose meter 1 each by Other route See Admin Instructions. Once Daily E11.69 E78.5 ? calcium carbonate-vitamin D3 Take 1 tablet by mouth 2 (two) times daily with breakfast and dinner ? diclofenac Apply 2 grams to affected area 4 times daily. ? lancets 100 each by Misc.(Non-Drug; Combo Route) route daily. E11.69 E78.5 ? lisinopriL-hydrochlorothiazide Take 1 tablet by mouth once daily ? metFORMIN TAKE 1 TABLET BY MOUTH TWICE DAILY WITH BREAKFAST AND WITH SUPPER ? multivitamin Take by mouth. ? simvastatin Take 1 tablet by mouth once daily No current facility-administered medications for this visit.  Allergies Allergen Reactions ? No Known Allergies  Family History Problem Relation Age of Onset ? No Known Problems Mother  ? No Known Problems Father  ? No Known Problems Sister  ? No Known Problems Brother  ? No Known Problems Maternal Grandmother  ? No Known Problems Maternal Grandfather  ? No Known Problems Paternal Grandmother  ? No Known Problems Paternal Grandfather  ? No Known Problems Cousin  Social History Tobacco Use ? Smoking status: Never Smoker ? Smokeless tobacco: Never Used Substance Use Topics ? Alcohol use: No ? Drug use: No Review of Systems:Review of Systems Constitutional: Negative for chills and fever. Respiratory: Negative for cough, shortness of breath and wheezing.  Cardiovascular: Negative for chest pain, palpitations and claudication. Gastrointestinal: Negative for constipation, diarrhea, nausea and vomiting. Genitourinary: Negative for frequency and urgency. Musculoskeletal: Negative for joint pain. 14 point ROS reviewed and pertinent positives listed above, the rest are reviewed and confirmed to be negative.Objective:Vital Signs: Blood pressure 110/70, pulse 68, weight 59 kg, SpO2 98 %. Physical Exam:Gen: Well developed. No acute distress.Eyes: Conjunctivae clear. No lid lagCV: Non-cyanotic.Chest: Non-labored breathing, good respiratory effort. Lymph: No visible regional lymphadenopathy. No gross edema noted. Skin: Visualized portions intact and no rashes or ecchymosis noted.Neuro/Psych: Alert and well-oriented. Mood/Affect: appropriate.Neuro/MSK:  No excessive fluid present in left knee IMPRESSION:  ICD-10-CM  1. Primary osteoarthritis of left knee  M17.12  Kristina Vasquez is a 74 y.o. female here with the following issues:Left knee OA responded well with IA steroid injectionPLAN:Repeat IA steroid injection in October if necessary-Recommend:  The risks, benefits, alternative treatment options and prognosis were discussed and all of patient's questions/concerns were addressed.-Depending on response to recommendations, in the future may consider evaluation for:    Follow-up:   Follow up in October with next injection. May certainly follow up sooner if needed.  Inez Catalina DOAssistant Professor of  Clinical Orthopaedics and RehabilitationYale [employer] of MedicineDept of Orthopaedics & Rehabilitation Division of Physical Medicine & RehabilitationScribed for Inez Catalina, MD by William Dalton, medical scribe August 17, 2021The documentation recorded by the scribe accurately reflects the services I personally performed and the decisions made by me. I reviewed and confirmed all material entered and/or pre-charted by the scribe.

## 2020-03-28 IMAGING — MG NEEDLE LOCALIZATION OF THE LEFT BREAST WITH MAMMO GUIDANCE
5 series · 5 of 5 positions shown · non-contrast
Comparison: Previous exam(s).

CLINICAL DATA: 72-year-old female for radioactive seed localization
of LEFT breast cancer prior to lumpectomy.

EXAM:
MAMMOGRAPHIC GUIDED RADIOACTIVE SEED LOCALIZATION OF THE LEFT BREAST

[L ML (1 of 3)]
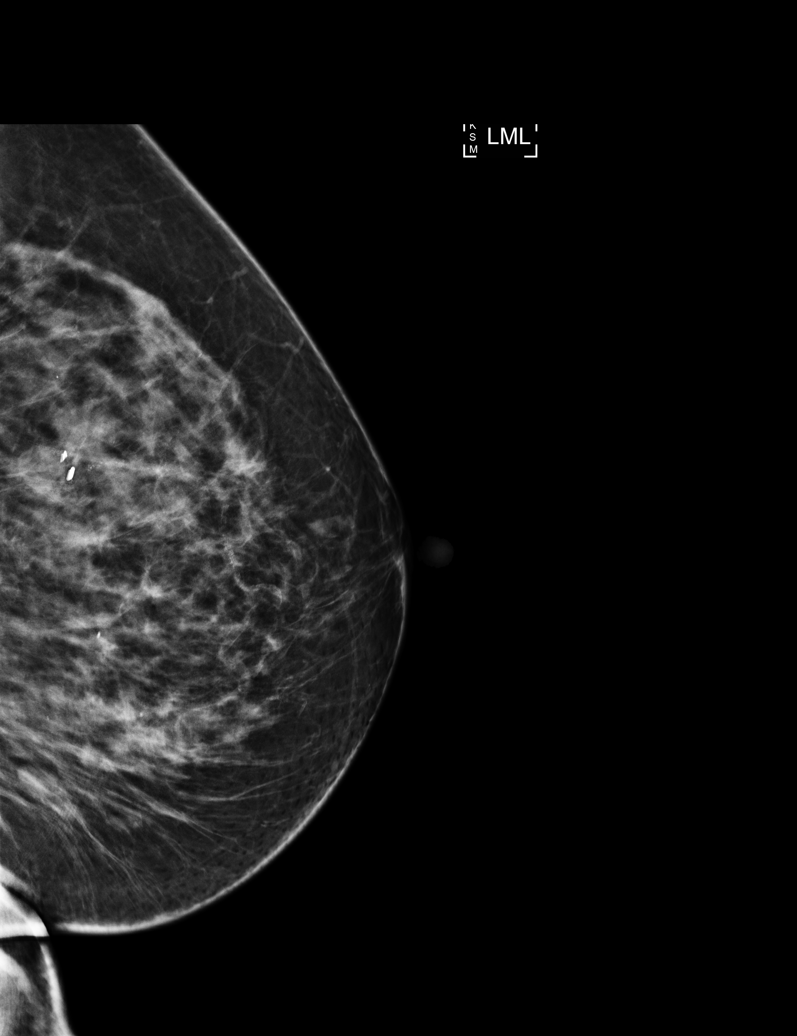

[L CC (1 of 2)]
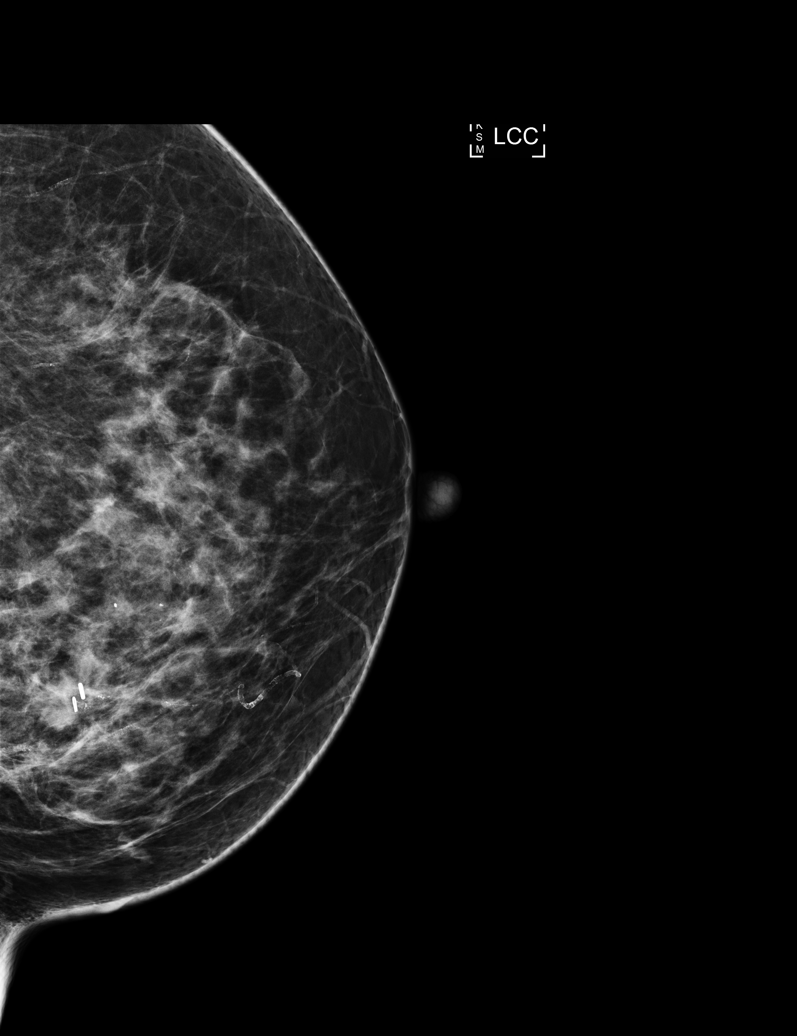

[L CC (2 of 2)]
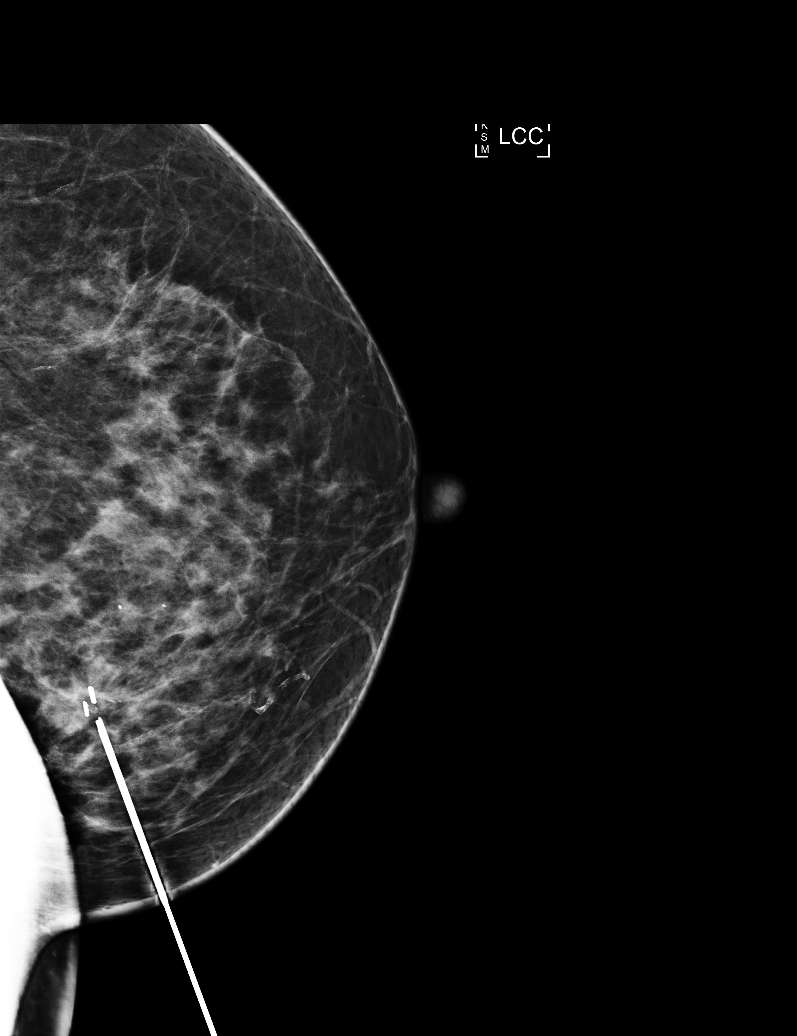

[L ML (2 of 3)]
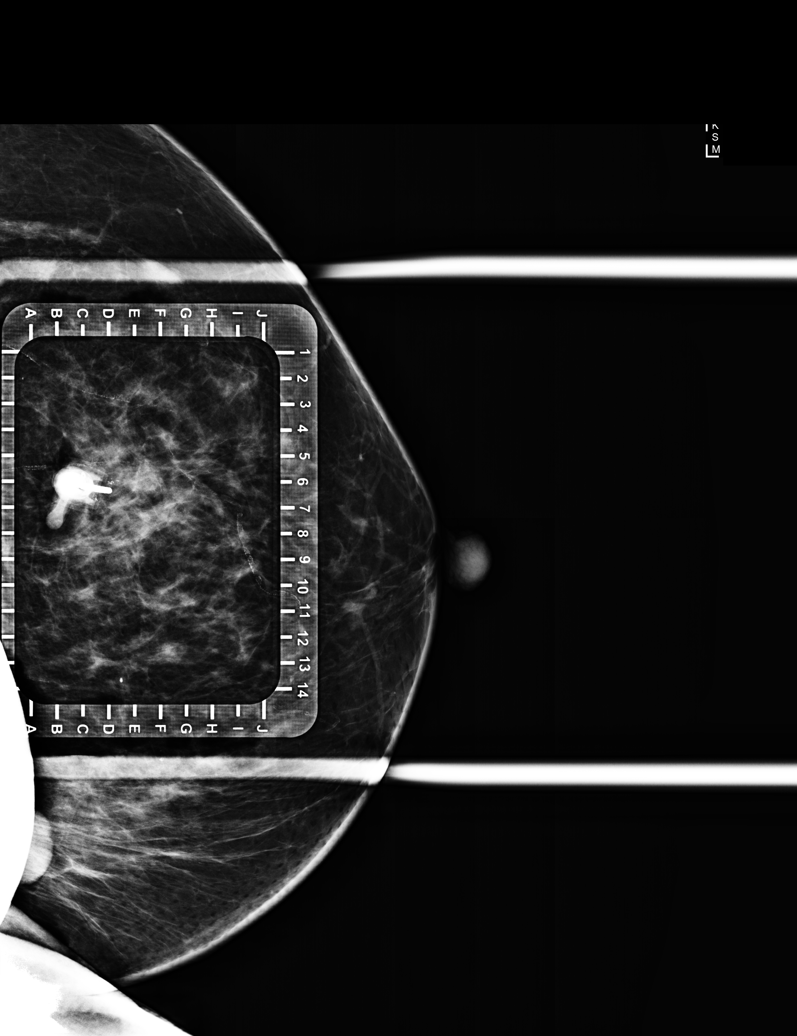

[L ML (3 of 3)]
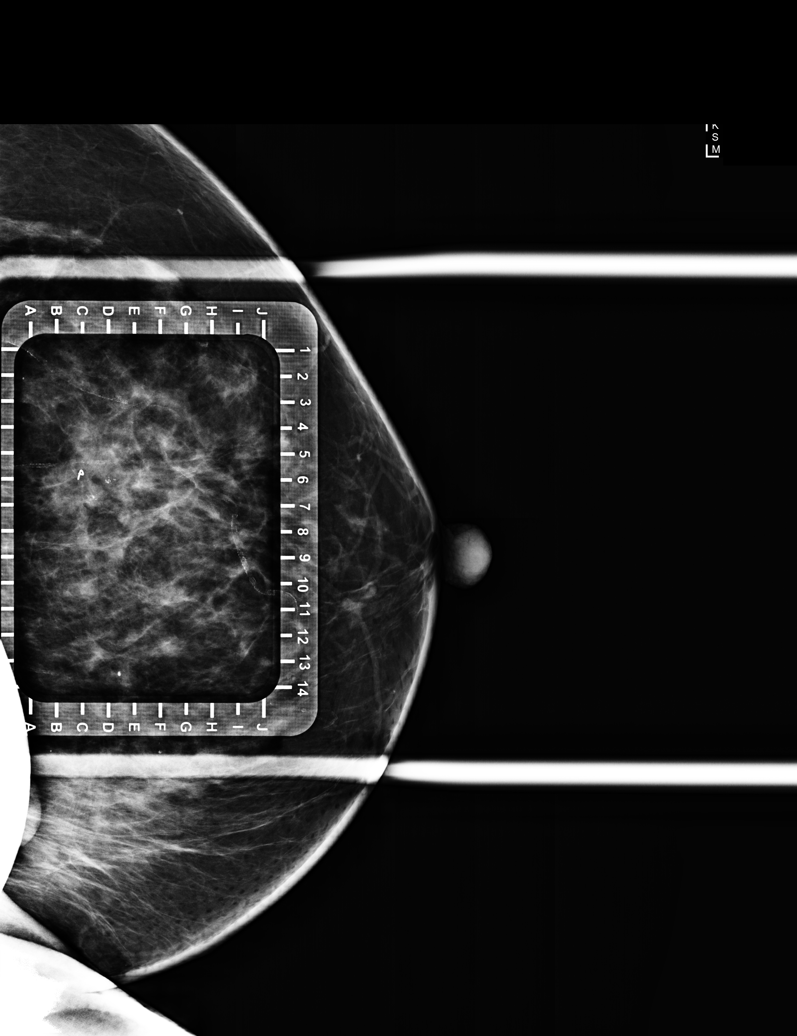

[5 of 5 positions shown; findings below may reference images not displayed]

FINDINGS: Patient presents for radioactive seed localization prior to LEFT
lumpectomy. I met with the patient and we discussed the procedure of
seed localization including benefits and alternatives. We discussed
the high likelihood of a successful procedure. We discussed the
risks of the procedure including infection, bleeding, tissue injury
and further surgery. We discussed the low dose of radioactivity
involved in the procedure. Informed, written consent was given.

The usual time-out protocol was performed immediately prior to the
procedure.

Using mammographic guidance, sterile technique, 1% lidocaine and an
J-[id number] radioactive seed, the RIBBON clip/mass was localized using a
MEDIAL approach. The follow-up mammogram images confirm the seed in
the expected location and were marked for Dr. Lynn.

Follow-up survey of the patient confirms presence of the radioactive
seed.

Order number of [id number] seed:  [id number].

Total activity:  0.248 millicuries.  Reference Date: 07/25/2018.

The patient tolerated the procedure well and was released from the
[REDACTED]. She was given instructions regarding seed removal.
IMPRESSION: Radioactive seed localization LEFT breast. No apparent
complications.

## 2020-04-12 ENCOUNTER — Other Ambulatory Visit: Payer: Self-pay | Admitting: Surgery

## 2020-04-12 DIAGNOSIS — R921 Mammographic calcification found on diagnostic imaging of breast: Secondary | ICD-10-CM

## 2020-04-16 ENCOUNTER — Ambulatory Visit
Admission: RE | Admit: 2020-04-16 | Discharge: 2020-04-16 | Disposition: A | Discharge status: Home / Self Care | Payer: Medicare Other | Source: Ambulatory Visit | Attending: Surgery | Admitting: Surgery

## 2020-04-16 ENCOUNTER — Other Ambulatory Visit: Payer: Self-pay | Admitting: Surgery

## 2020-04-16 ENCOUNTER — Other Ambulatory Visit: Payer: Self-pay

## 2020-04-16 DIAGNOSIS — N6320 Unspecified lump in the left breast, unspecified quadrant: Secondary | ICD-10-CM

## 2020-04-16 DIAGNOSIS — R921 Mammographic calcification found on diagnostic imaging of breast: Secondary | ICD-10-CM

## 2020-04-22 ENCOUNTER — Other Ambulatory Visit: Payer: Self-pay

## 2020-04-22 ENCOUNTER — Ambulatory Visit
Admission: RE | Admit: 2020-04-22 | Discharge: 2020-04-22 | Disposition: A | Discharge status: Home / Self Care | Payer: Medicare Other | Source: Ambulatory Visit | Attending: Surgery | Admitting: Surgery

## 2020-04-22 DIAGNOSIS — N6320 Unspecified lump in the left breast, unspecified quadrant: Secondary | ICD-10-CM

## 2020-05-06 ENCOUNTER — Other Ambulatory Visit: Payer: Self-pay

## 2020-05-06 ENCOUNTER — Ambulatory Visit
Admission: RE | Admit: 2020-05-06 | Discharge: 2020-05-06 | Disposition: A | Discharge status: Home / Self Care | Payer: Medicare Other | Source: Ambulatory Visit | Attending: Family Medicine | Admitting: Family Medicine

## 2020-05-06 DIAGNOSIS — E2839 Other primary ovarian failure: Secondary | ICD-10-CM

## 2020-05-20 ENCOUNTER — Encounter
Admit: 2020-05-20 | Payer: PRIVATE HEALTH INSURANCE | Attending: Physical Medicine and Rehabilitation | Primary: Internal Medicine

## 2020-05-20 ENCOUNTER — Encounter: Admit: 2020-05-20 | Payer: MEDICARE | Attending: Physical Medicine and Rehabilitation | Primary: Internal Medicine

## 2020-05-20 DIAGNOSIS — I10 Essential (primary) hypertension: Secondary | ICD-10-CM

## 2020-05-20 DIAGNOSIS — M25562 Pain in left knee: Secondary | ICD-10-CM

## 2020-05-20 DIAGNOSIS — M25561 Pain in right knee: Secondary | ICD-10-CM

## 2020-05-20 DIAGNOSIS — M1712 Unilateral primary osteoarthritis, left knee: Secondary | ICD-10-CM

## 2020-05-20 DIAGNOSIS — E785 Hyperlipidemia, unspecified: Secondary | ICD-10-CM

## 2020-05-20 MED ORDER — TRIAMCINOLONE ACETONIDE 40 MG/ML SUSPENSION FOR INJECTION
40 mg/mL | Freq: Once | INTRA_ARTICULAR | Status: CP | PRN
Start: 2020-05-20 — End: ?
  Administered 2020-05-20: 20:00:00 40 mg/mL via INTRA_ARTICULAR

## 2020-05-20 NOTE — Progress Notes
[email or web address] Name:  Kristina Vasquez Patient MRN:  [id number] DOB:  03-21-1947Provider:  Dr. Berkley Harvey Provider:  TokarzPrimary Care Physician:  Maribi, Mal[facility] Complaint Patient presents with ? Procedure   Knee injection   FOLLOW-UPSubjective:Kristina Vasquez is a 74 y.o. year old female who presents to the clinic today with chief complaint(s) as above. Patient was last seen on March 26, 2020 for chronic left knee pain. She has received IA steroid injection on February 27, 2020 and responded well. Patient presents to the clinic for a repeat injection. Patient reports left knee is still doing good. She notes prolonged walking aggravates her knee. She would like a repeat left knee IA steroid injection today. Following up for: IA steroid injection Response to treatment recommendations: improvement would like repeat injection as her symptoms are returning===Past Medical History: Diagnosis Date ? Hypertension  ? Other and unspecified hyperlipidemia 11/06/2014 ? Pain in left knee 08/09/2014 ? Pain in right knee 09/27/2014 Past Surgical History: Procedure Laterality Date ? CATARACT EXTRACTION Bilateral 2013 ? JOINT REPLACEMENT   ? TOTAL KNEE ARTHROPLASTY Right 07/2015 Current Medications Taking:Current Outpatient Medications Medication Sig ? blood sugar diagnostic 100 each by Other route daily. Once daily E11.69 E78.9 ? blood-glucose meter 1 each by Other route See Admin Instructions. Once Daily E11.69 E78.5 ? calcium carbonate-vitamin D3 Take 1 tablet by mouth 2 (two) times daily with breakfast and dinner ? diclofenac Apply 2 grams to affected area 4 times daily. ? lancets 100 each by Misc.(Non-Drug; Combo Route) route daily. E11.69 E78.5 ? lisinopriL-hydrochlorothiazide Take 1 tablet by mouth once daily ? metFORMIN TAKE 1 TABLET BY MOUTH TWICE DAILY WITH BREAKFAST AND WITH SUPPER ? multivitamin Take by mouth. ? simvastatin Take 1 tablet by mouth once daily No current facility-administered medications for this visit.  Allergies Allergen Reactions ? No Known Allergies  Family History Problem Relation Age of Onset ? No Known Problems Mother  ? No Known Problems Father  ? No Known Problems Sister  ? No Known Problems Brother  ? No Known Problems Maternal Grandmother  ? No Known Problems Maternal Grandfather  ? No Known Problems Paternal Grandmother  ? No Known Problems Paternal Grandfather  ? No Known Problems Cousin  Social History Tobacco Use ? Smoking status: Never Smoker ? Smokeless tobacco: Never Used Substance Use Topics ? Alcohol use: No ? Drug use: No Review of Systems:Review of Systems Constitutional: Negative for chills and fever. Musculoskeletal: Positive for joint pain (left knee). 14 point ROS reviewed and pertinent positives listed above, the rest are reviewed and confirmed to be negative.Objective:Vital Signs: Height 4' 11 (1.499 m), weight 59 kg.Physical Exam:Gen: Well developed. No acute distress.Eyes: Conjunctivae clear. No lid lagCV: Non-cyanotic.Chest: Non-labored breathing, good respiratory effort. Lymph: No visible regional lymphadenopathy. No gross edema noted. Skin: Visualized portions intact and no rashes or ecchymosis noted.Neuro/Psych: Alert and well-oriented. Mood/Affect: appropriate.Diagnostic Imaging: No results found.Other Diagnostic Studies:IMPRESSION:  ICD-10-CM  1. Primary osteoarthritis of left knee  M17.12  Kristina Vasquez is a 74 y.o. female here with the following issues:Left knee OA. Repeat left IA injection as she responded well to steroid today in clinic.  PLAN:Large Joint Injection: L knee on 05/20/2020 3:30 PMIndications: painDetails: 22 G needle, ultrasound-guided superior approachLocal anesthetic:  BupivacaineAnesthetic total (ml):  : 40 mg triamcinolone acetonide 40 mg/mLOutcome: tolerated well, no immediate complicationsProcedure, treatment alternatives, risks and benefits explained, specific risks discussed. Consent was given by the patient. Immediately prior to procedure a time out was called to verify the  correct patient, procedure, equipment, support staff and site/side marked as required. Patient was prepped and draped in the usual sterile fashion. -Recommend:  The risks, benefits, alternative treatment options and prognosis were discussed and all of patient's questions/concerns were addressed.  Follow-up:   Follow up in 3 months. May certainly follow up sooner if needed.  Inez Catalina DOAssistant Professor of Clinical Orthopaedics and Enterprise Products of MedicineDept of Orthopaedics & Rehabilitation Division of Physical Medicine & RehabilitationScribed for Inez Catalina, MD by William Dalton, medical scribe October 11, 2021The documentation recorded by the scribe accurately reflects the services I personally performed and the decisions made by me. I reviewed and confirmed all material entered and/or pre-charted by the scribe.

## 2020-05-25 ENCOUNTER — Other Ambulatory Visit: Payer: Self-pay | Admitting: Hematology and Oncology

## 2020-07-31 ENCOUNTER — Other Ambulatory Visit: Payer: Self-pay | Admitting: Surgery

## 2020-07-31 ENCOUNTER — Telehealth: Payer: Self-pay | Admitting: *Deleted

## 2020-07-31 DIAGNOSIS — Z9889 Other specified postprocedural states: Secondary | ICD-10-CM

## 2020-07-31 NOTE — Telephone Encounter (Signed)
Received call from pt with complaint of hot flashes and dry mouth while on anastrozole.  Pt states she is not interested in starting Effexor at this time.  Per MD pt to stop taking Anastrozole x2 weeks to see if symptoms improve.  If they do, the pt may need to be started on Letrozole.  Pt verbalized understanding and states she will update the clinic in 2 weeks.

## 2020-08-19 ENCOUNTER — Encounter: Admit: 2020-08-19 | Payer: MEDICARE | Attending: Physical Medicine and Rehabilitation | Primary: Internal Medicine

## 2020-08-19 ENCOUNTER — Encounter
Admit: 2020-08-19 | Payer: PRIVATE HEALTH INSURANCE | Attending: Physical Medicine and Rehabilitation | Primary: Internal Medicine

## 2020-08-19 DIAGNOSIS — M170 Bilateral primary osteoarthritis of knee: Secondary | ICD-10-CM

## 2020-08-19 DIAGNOSIS — M25561 Pain in right knee: Secondary | ICD-10-CM

## 2020-08-19 DIAGNOSIS — E785 Hyperlipidemia, unspecified: Secondary | ICD-10-CM

## 2020-08-19 DIAGNOSIS — I10 Essential (primary) hypertension: Secondary | ICD-10-CM

## 2020-08-19 DIAGNOSIS — M1712 Unilateral primary osteoarthritis, left knee: Secondary | ICD-10-CM

## 2020-08-19 DIAGNOSIS — M25562 Pain in left knee: Secondary | ICD-10-CM

## 2020-08-19 MED ORDER — TRIAMCINOLONE ACETONIDE 40 MG/ML SUSPENSION FOR INJECTION
40 mg/mL | Freq: Once | INTRA_ARTICULAR | Status: CP | PRN
Start: 2020-08-19 — End: ?
  Administered 2020-08-19: 20:00:00 40 mg/mL via INTRA_ARTICULAR

## 2020-08-19 NOTE — Progress Notes
[place] MedicineDepartment of [email or web address] NOTEPatient Name:  Kristina Vasquez Patient MRN:  [id number] DOB:  1947-08-01Provider:  Dr. Berkley Harvey Provider:  TokarzPrimary Care Physician:  [facility] Complaint Patient presents with ? Procedure   left knee injection (05/20/20). Patient wants to repeat injection   Subjective:Kristina Vasquez is a 75 y.o. year old female who presents to the clinic today with chief concern(s) as above. Patient was last seen on 05/20/20 for left knee pain. Pain seems to be aggravated with prolonged walking. She was provided with a left knee IA steroid injection in clinic which provided great relief until the last 2 weeks when her pain returned. She would like a repeat injection today. Patient presents for procedure today.  HPI as per associated clinic notes.  Pre-Procedural ChecklistDriver:                  [ ]  YES   [ ]  NO   [ x] N/ABlood Thinners:                [ ]  YES   [x ] NO  Contrast Allergy:              [ ]  YES   [ x] NO Recent Infection:             [ ]  YES   [ x] NO Diabetic:                              [ ]   YES  [x ] NO                     ===Past Medical History: Diagnosis Date ? Hypertension  ? Other and unspecified hyperlipidemia 11/06/2014 ? Pain in left knee 08/09/2014 ? Pain in right knee 09/27/2014 Past Surgical History: Procedure Laterality Date ? CATARACT EXTRACTION Bilateral 2013 ? JOINT REPLACEMENT   ? TOTAL KNEE ARTHROPLASTY Right 07/2015 Current Medications Taking:Current Outpatient Medications Medication Sig ? blood sugar diagnostic 100 each by Other route daily. Once daily E11.69 E78.9 ? blood-glucose meter 1 each by Other route See Admin Instructions. Once Daily E11.69 E78.5 ? diclofenac Apply 2 grams to affected area 4 times daily. ? lancets 100 each by Misc.(Non-Drug; Combo Route) route daily. E11.69 E78.5 ? lisinopriL-hydrochlorothiazide Take 1 tablet by mouth once daily ? metFORMIN TAKE 1 TABLET BY MOUTH TWICE DAILY WITH BREAKFAST AND WITH SUPPER ? simvastatin Take 1 tablet by mouth once daily ? calcium carbonate-vitamin D3 Take 1 tablet by mouth 2 (two) times daily with breakfast and dinner (Patient not taking: Reported on 05/28/2020) No current facility-administered medications for this visit.  Allergies Allergen Reactions ? No Known Allergies  Review of Systems:Review of Systems Musculoskeletal: Positive for joint pain (L knee). 14 point ROS reviewed and pertinent positives listed above, the rest are reviewed and confirmed to be negative.Objective:Vital Signs: Height 4' 11 (1.499 m), weight 58.2 kg.Physical Exam:Gen: Well developed. No acute distress.CV: RRR, no M/R/G Chest: Non-labored breathing, CTAB.Lymph: No visible regional lymphadenopathy or edema.Skin: Visualized portions intact. No rashes or ecchymosis noted.Psych: Alert and well-oriented. Mood/Affect: normal.Patient suitable for procedure today: [ x] YES   [ ]  NO IMPRESSION/PLAN:No diagnosis found.Kristina Vasquez is a 75 y.o. female here with the following issues:-Patient is here for procedure today: repeat left knee IA steroid injection -The risks, benefits, alternative treatment options and prognosis were discussed and all of patient's questions/concerns were addressed.-I have reassessed the patient by examination and  review of relevant data pertaining to the planned procedure. I have verified the planned procedure and there are no relevant changes since the H&P.  Please see prior associated clinical notes for further details.  PROCEDURE:Large Joint Injection: L knee on 08/19/2020 3:00 PMIndications: painDetails: 25 G needle, ultrasound-guided superior approachLocal anesthetic: Anesthetic total (ml): : 40 mg triamcinolone acetonide 40 mg/mLOutcome: tolerated well, no immediate complicationsProcedure, treatment alternatives, risks and benefits explained, specific risks discussed. Consent was given by the patient. Immediately prior to procedure a time out was called to verify the correct patient, procedure, equipment, support staff and site/side marked as required. Patient was prepped and draped in the usual sterile fashion. Patient was observed in clinic for 30 minutes and had no adverse reaction and was then allowed to go homeFollow-up:  May certainly follow up sooner if needed.  ===Robbie Rideaux Era Bumpers Professor of Clinical Orthopaedics and Enterprise Products of MedicineDept of Orthopaedics & Rehabilitation Division of Physical Medicine & RehabilitationScribed for Inez Catalina, MD by William Dalton, medical scribe August 19, 2020

## 2020-09-02 ENCOUNTER — Other Ambulatory Visit: Payer: Self-pay | Admitting: Hematology and Oncology

## 2020-09-05 ENCOUNTER — Other Ambulatory Visit: Payer: Self-pay

## 2020-09-05 ENCOUNTER — Ambulatory Visit
Admission: RE | Admit: 2020-09-05 | Discharge: 2020-09-05 | Disposition: A | Discharge status: Home / Self Care | Payer: Medicare Other | Source: Ambulatory Visit | Attending: Surgery | Admitting: Surgery

## 2020-09-05 DIAGNOSIS — Z9889 Other specified postprocedural states: Secondary | ICD-10-CM

## 2020-10-15 MED ORDER — LISINOPRIL 20 MG-HYDROCHLOROTHIAZIDE 12.5 MG TABLET
20-12.5 | ORAL_TABLET | ORAL | 4 refills | 90.00000 days | Status: AC
Start: 2020-10-15 — End: 2020-10-18

## 2020-10-15 MED ORDER — SIMVASTATIN 20 MG TABLET
20 | ORAL_TABLET | ORAL | 4 refills | 90.00000 days | Status: AC
Start: 2020-10-15 — End: 2020-10-18

## 2020-10-15 MED ORDER — METFORMIN IMMEDIATE RELEASE 850 MG TABLET
850 | ORAL_TABLET | ORAL | 4 refills | 90.00000 days | Status: AC
Start: 2020-10-15 — End: 2020-10-18

## 2020-10-18 MED ORDER — METFORMIN IMMEDIATE RELEASE 850 MG TABLET
850 | ORAL_TABLET | Freq: Every day | ORAL | 4 refills | 90.00000 days | Status: AC
Start: 2020-10-18 — End: 2020-12-03

## 2020-10-18 MED ORDER — LISINOPRIL 20 MG-HYDROCHLOROTHIAZIDE 12.5 MG TABLET
20-12.5 | ORAL_TABLET | Freq: Every day | ORAL | 4 refills | 90.00000 days | Status: AC
Start: 2020-10-18 — End: 2022-01-06

## 2020-10-18 MED ORDER — SIMVASTATIN 20 MG TABLET
20 | ORAL_TABLET | Freq: Every day | ORAL | 4 refills | 90.00000 days | Status: AC
Start: 2020-10-18 — End: 2022-01-06

## 2020-11-09 ENCOUNTER — Other Ambulatory Visit: Payer: Self-pay | Admitting: Hematology and Oncology

## 2020-11-12 ENCOUNTER — Encounter
Admit: 2020-11-12 | Payer: PRIVATE HEALTH INSURANCE | Attending: Physical Medicine and Rehabilitation | Primary: Internal Medicine

## 2020-11-12 ENCOUNTER — Encounter: Admit: 2020-11-12 | Payer: MEDICARE | Attending: Physical Medicine and Rehabilitation | Primary: Internal Medicine

## 2020-11-12 DIAGNOSIS — M25562 Pain in left knee: Secondary | ICD-10-CM

## 2020-11-12 DIAGNOSIS — M1712 Unilateral primary osteoarthritis, left knee: Secondary | ICD-10-CM

## 2020-11-12 DIAGNOSIS — E785 Hyperlipidemia, unspecified: Secondary | ICD-10-CM

## 2020-11-12 DIAGNOSIS — I10 Essential (primary) hypertension: Secondary | ICD-10-CM

## 2020-11-12 DIAGNOSIS — M25561 Pain in right knee: Secondary | ICD-10-CM

## 2020-11-12 MED ORDER — HYLAN G-F 20  48 MG/6 ML INTRA-ARTICULAR SYRINGE
48 mg/6 mL | Freq: Once | INTRA_ARTICULAR | Status: CP | PRN
Start: 2020-11-12 — End: ?
  Administered 2020-11-12: 19:00:00 48 mg/6 mL via INTRA_ARTICULAR

## 2020-11-12 NOTE — Progress Notes
[place] MedicineDepartment of [email or web address] NOTEPatient Name:  Kristina Vasquez Patient MRN:  [id number] DOB:  July 05, 1947Provider:  Dr. Berkley Harvey Provider:  TokarzPrimary Care Physician:  Marianna Payment chief complaint on file. Subjective:Kristina Vasquez is a 75 y.o. year old female who presents to the clinic today with chief concern(s) as above. The patient was last seen on 08/19/20 for a repeat left knee IA steroid injection.Today, she states the injection was helpful for approximately a month, then the pain returned. She presents today for a repeat injection.Patient presents for procedure today.  HPI as per associated clinic notes.  Pre-Procedural ChecklistDriver:                  [ ]  YES   [ ]  NO   [x ] N/ABlood Thinners:                [ ]  YES   [x ] NO  Contrast Allergy:              [ ]  YES   [x ] NO Recent Infection:             [ ]  YES   [x ] NO Diabetic:                              [ ]   YES  [ x] NO                     ===Past Medical History: Diagnosis Date ? Hypertension  ? Other and unspecified hyperlipidemia 11/06/2014 ? Pain in left knee 08/09/2014 ? Pain in right knee 09/27/2014 Past Surgical History: Procedure Laterality Date ? CATARACT EXTRACTION Bilateral 2013 ? JOINT REPLACEMENT   ? TOTAL KNEE ARTHROPLASTY Right 07/2015 Current Medications Taking:Current Outpatient Medications Medication Sig ? blood sugar diagnostic 100 each by Other route daily. Once daily E11.69 E78.9 ? blood-glucose meter 1 each by Other route See Admin Instructions. Once Daily E11.69 E78.5 ? calcium carbonate-vitamin D3 Take 1 tablet by mouth 2 (two) times daily with breakfast and dinner (Patient not taking: Reported on 05/28/2020) ? diclofenac Apply 2 grams to affected area 4 times daily. ? lancets 100 each by Misc.(Non-Drug; Combo Route) route daily. E11.69 E78.5 ? lisinopriL-hydrochlorothiazide Take 1 tablet by mouth daily. ? metFORMIN Take 1 tablet (850 mg total) by mouth daily. ? simvastatin Take 1 tablet (20 mg total) by mouth daily. No current facility-administered medications for this visit.  Allergies Allergen Reactions ? No Known Allergies  Review of Systems:Review of Systems Musculoskeletal: Positive for joint pain (L Knee). 14 point ROS reviewed and pertinent positives listed above, the rest are reviewed and confirmed to be negative.Objective:Vital Signs: There were no vitals taken for this visit.Physical Exam:Gen: Well developed. No acute distress.CV: RRR, no M/R/G Chest: Non-labored breathing, CTAB.Lymph: No visible regional lymphadenopathy or edema.Skin: Visualized portions intact. No rashes or ecchymosis noted.Psych: Alert and well-oriented. Mood/Affect: normal.Patient suitable for procedure today: [x ] YES   [ ]  NO IMPRESSION/PLAN:  ICD-10-CM  1. Primary osteoarthritis of left knee  M17.12  Kristina Vasquez is a 75 y.o. female here with the following issues:-Patient is here for procedure today: Synvisc Injection-The risks, benefits, alternative treatment options and prognosis were discussed and all of patient's questions/concerns were addressed.-I have reassessed the patient by examination and review of relevant data pertaining to the planned procedure. I have verified the planned procedure and there are no relevant changes since the H&P.  Please  see prior associated clinical notes for further details.  PROCEDURE:Large Joint Injection: L knee on 11/12/2020 3:00 PMIndications: painDetails: 22 G needle, ultrasound-guided superior approachMedications: 48 mg hylan G-F 20 48 mg/6 mLOutcome: tolerated well, no immediate complicationsProcedure, treatment alternatives, risks and benefits explained, specific risks discussed. Consent was given by the patient. Immediately prior to procedure a time out was called to verify the correct patient, procedure, equipment, support staff and site/side marked as required. Patient was prepped and draped in the usual sterile fashion. Patient was observed in clinic for 30 minutes and had no adverse reaction and was then allowed to go homeFollow-up:  3-4 weeks to assess response to treatment.  May certainly follow up sooner if needed.  ===Onelia Cadmus Era Bumpers Professor of Clinical Orthopaedics and Enterprise Products of MedicineDept of Orthopaedics & Rehabilitation Division of Physical Medicine & RehabilitationScribed for Inez Catalina, MD by Lonna Cobb, medical scribe April 5, 2022The documentation recorded by the scribe accurately reflects the services I personally performed and the decisions made by me. I reviewed and confirmed all material entered and/or pre-charted by the scribe.

## 2020-12-03 MED ORDER — METFORMIN IMMEDIATE RELEASE 850 MG TABLET
850 | ORAL_TABLET | Freq: Two times a day (BID) | ORAL | 4 refills | 90.00000 days | Status: AC
Start: 2020-12-03 — End: 2021-10-08

## 2020-12-03 MED ORDER — DICLOFENAC 1 % TOPICAL GEL
1 | Freq: Four times a day (QID) | TOPICAL | 12 refills | 25.00000 days | Status: AC
Start: 2020-12-03 — End: 2022-02-02

## 2021-01-16 NOTE — Assessment & Plan Note (Signed)
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  Plan: 1.Adjuvant Taxol Herceptin weekly x12completed 4/20/2020followed by Herceptin maintenance for 1 yearto be completed12/24/2020 2.Adjuvant radiation therapycompleted 02/13/2019 3.Followed by adjuvant antiestrogen therapyto start January 2021 --------------------------------------------------------------------------------------------------------------------------------------------------------- Current treatment: Adjuvant Herceptin completed12/24/2020, adjuvant anastrozole started January 2021 Adjuvant radiationcompleted 02/13/2019 Echocardiogram 11/29/2018: EF 55 to 60%  Iron deficiencyanemia:IV iron given 9/14/2020currently on oral iron which will be completed in December 2020 hemoglobin improved to 13.7  Anastrozole toxicities: tolerating it well. No side effects  Breast cancer surveillance: 1.  Breast exam 01/17/21: Benign 2.  Mammogram: Indeterminate 9 mm left breast calcifications February 2021 stereotactic biopsy 10/09/2019: Benign: Fibrosis Next mammogram in Aug  Return to clinic in 1 year for follow-up

## 2021-01-16 NOTE — Progress Notes (Signed)
Telephone visit: Patient was unable to come in person to the office so we performed a telephone visit today.  Patient Care Team: [name], MD as PCP - General (Family Medicine) Nicholas Lose, MD as Consulting Physician (Hematology and Oncology) Delice Bison, Charlestine Massed, NP as Nurse Practitioner (Hematology and Oncology) Fanny Skates, MD as Consulting Physician (General Surgery) Eppie Gibson, MD as Attending Physician (Radiation Oncology)  DIAGNOSIS:    ICD-10-CM   1. Malignant neoplasm of upper-inner quadrant of left breast in female, estrogen receptor positive ([place])  C50.212    Z17.0       SUMMARY OF ONCOLOGIC HISTORY: Oncology History  Malignant neoplasm of upper-inner quadrant of left breast in female, estrogen receptor positive ([place])  07/18/2018 Initial Diagnosis   Screening detected left breast mass at 10 o'clock position 1.8 cm axilla negative, biopsy revealed grade 3 IDC ER 80%, PR 70%, Ki-67 40%, HER-2 +3+ by IHC with heterogeneity, T1c N0 stage Ia clinical stage    08/15/2018 Surgery   Left Lumpectomy Dalbert Batman) [phone]): IDC grade 3, 1.9 cm, Posterior margin positive (Muscle) due to a transected satellite nodule 0.5 cm, 3 lymph nodes negative for carcinoma, ER 80%, PR 70%, Ki-67 40%, HER-2 +3+ by IHC with heterogeneity T1cN0 Stage 1A.    08/24/2018 Cancer Staging   Staging form: Breast, AJCC 8th Edition - Pathologic: Stage IA (pT1b, pN0, cM0, G3, ER+, PR+, HER2+)     09/09/2018 - 08/03/2019 Chemotherapy   trastuzumab (HERCEPTIN) 300 mg in sodium chloride 0.9 % 250 mL chemo infusion, 294 mg, Intravenous,  Once, 8 of 8 cycles. Administration: 300 mg (09/09/2018), 150 mg (09/19/2018), 150 mg (10/10/2018), 450 mg (12/19/2018), 450 mg (01/09/2019), 450 mg (03/13/2019), 150 mg (09/26/2018), 150 mg (10/03/2018), 150 mg (10/17/2018), 150 mg (10/24/2018), 150 mg (10/31/2018), 150 mg (11/07/2018), 150 mg (11/14/2018), 150 mg (11/21/2018), 450 mg (11/28/2018), 450 mg (01/30/2019), 450 mg  (02/20/2019)  PACLitaxel (TAXOL) 150 mg in sodium chloride 0.9 % 250 mL chemo infusion (</= $RemoveBefor'80mg'KIgvCRlybhsP$ /m2), 80 mg/m2 = 150 mg, Intravenous,  Once, 3 of 3 cycles. Dose modification: 60 mg/m2 (original dose 80 mg/m2, Cycle 2, Reason: Dose not tolerated). Administration: 150 mg (09/09/2018), 150 mg (09/19/2018), 150 mg (10/10/2018), 150 mg (09/26/2018), 150 mg (10/03/2018), 114 mg (10/17/2018), 114 mg (10/24/2018), 114 mg (10/31/2018), 114 mg (11/07/2018), 114 mg (11/14/2018), 114 mg (11/21/2018), 114 mg (11/28/2018)  trastuzumab-dkst (OGIVRI) 450 mg in sodium chloride 0.9 % 250 mL chemo infusion, 450 mg (100 % of original dose 450 mg), Intravenous,  Once, 7 of 7 cycles. Dose modification: 450 mg (original dose 450 mg, Cycle 9, Reason: Other (see comments), Comment: Biosimilar Conversion). Administration: 450 mg (04/03/2019), 450 mg (04/24/2019), 450 mg (05/15/2019), 450 mg (06/05/2019), 450 mg (06/26/2019), 450 mg (07/17/2019), 450 mg (08/03/2019)    01/16/2019 - 02/13/2019 Radiation Therapy   The patient initially received a dose of 40.05 Gy in 15 fractions to the breast using whole-breast tangent fields. This was delivered using a 3-D conformal technique. The pt received a boost delivering an additional 10 Gy in 5 fractions using a electron boost with 22meV electrons. The total dose was 50.05 Gy.    08/2019 - 08/2024 Anti-estrogen oral therapy   Anastrozole     CHIEF COMPLIANT: Follow-up of left breast cancer on anastrozole   INTERVAL HISTORY: Gloria Mckinney is a 75 y.o. with above-mentioned history of left breast cancer treated with lumpectomy, adjuvant chemotherapy, radiation, Herceptin maintenance, and is currently on antiestrogen therapy with anastrozole. Mammogram on 09/05/20 showed no  evidence of malignancy bilaterally. She presents via telephone for follow-up.  She reports no new problems or concerns.  She is tolerating anastrozole fairly well.  ALLERGIES:  is allergic to penicillins, cephalexin, and  statins.  MEDICATIONS:  Current Outpatient Medications  Medication Sig Dispense Refill   anastrozole (ARIMIDEX) 1 MG tablet Take 1 tablet (1 mg total) by mouth daily. 90 tablet 3   aspirin EC 81 MG tablet Take 81 mg by mouth daily.     Calcium Carb-Cholecalciferol (CALCIUM + VITAMIN D3 PO) Take 1 tablet by mouth daily.     Empagliflozin-metFORMIN HCl (SYNJARDY) 12.12-998 MG TABS Take 1 tablet by mouth 2 (two) times daily.     EXFORGE 5-320 MG tablet Take 1 tablet by mouth daily.     hydrochlorothiazide (HYDRODIURIL) 25 MG tablet Take 25 mg by mouth daily.     lidocaine-prilocaine (EMLA) cream Apply to affected area once 30 g 3   omeprazole (PRILOSEC) 40 MG capsule Take 40 mg by mouth every morning.      simvastatin (ZOCOR) 40 MG tablet Take 40 mg by mouth daily.     No current facility-administered medications for this visit.   Facility-Administered Medications Ordered in Other Visits  Medication Dose Route Frequency Provider Last Rate Last Admin   sodium chloride flush (NS) 0.9 % injection 10 mL  10 mL Intravenous PRN Nicholas Lose, MD   10 mL at 09/09/18 1114    PHYSICAL EXAMINATION: ECOG PERFORMANCE STATUS: 1 - Symptomatic but completely ambulatory    LABORATORY DATA:  I have reviewed the data as listed CMP Latest Ref Rng & Units 08/03/2019 06/26/2019 05/15/2019  Glucose 70 - 99 mg/dL 245(H) 251(H) 149(H)  BUN 8 - 23 mg/dL $Remove'14 17 17  'fIIqLip$ Creatinine 0.44 - 1.00 mg/dL 0.92 1.01(H) 0.89  Sodium 135 - 145 mmol/L 139 137 136  Potassium 3.5 - 5.1 mmol/L 4.3 4.7 4.1  Chloride 98 - 111 mmol/L 103 102 103  CO2 22 - 32 mmol/L 21(L) 23 23  Calcium 8.9 - 10.3 mg/dL 9.8 10.2 9.8  Total Protein 6.5 - 8.1 g/dL 7.1 7.3 7.2  Total Bilirubin 0.3 - 1.2 mg/dL 0.3 0.3 0.4  Alkaline Phos 38 - 126 U/L 93 91 90  AST 15 - 41 U/L $Remo'16 17 16  'KkLbg$ ALT 0 - 44 U/L $Remo'22 24 20    'wfAjD$ Lab Results  Component Value Date   WBC 7.4 08/03/2019   HGB 12.8 08/03/2019   HCT 37.1 08/03/2019   MCV 82.3 08/03/2019   PLT 222  08/03/2019   NEUTROABS 4.5 08/03/2019    ASSESSMENT & PLAN:  Malignant neoplasm of upper-inner quadrant of left breast in female, estrogen receptor positive ([place]) 08/15/18: Left Lumpectomy: IDC grade 3, 1.9 cm, Posterior margin positive (Muscle) due to a transected satellite nodule 0.5 cm, 0/3 LN Neg, ER 80%, PR 70%, Ki-67 40%, HER-2 +3+ by IHC with heterogeneity T1cN0 Stage 1A   Posterior Margin: based on discussions with surgery, it is muscle and hence no further surgery is possible.   Plan: 1. Adjuvant Taxol Herceptin weekly x12 completed 11/28/2018 followed by Herceptin maintenance for 1 year to be completed 08/03/2019 2.  Adjuvant radiation therapy completed 02/13/2019 3.  Followed by adjuvant antiestrogen therapy to start January 2021 --------------------------------------------------------------------------------------------------------------------------------------------------------- Current treatment: Adjuvant Herceptin completed 08/03/2019, adjuvant anastrozole started January 2021 Adjuvant radiation completed 02/13/2019 Echocardiogram 11/29/2018: EF 55 to 60%   Iron deficiency anemia:  IV iron given 04/24/2019 currently on oral iron which will be completed in December  2020 hemoglobin improved to 13.7   Anastrozole toxicities: tolerating it well. No side effects   Breast cancer surveillance:  Mammogram: January 2022: Benign breast density category D  Return to clinic in 1 year for follow-up      No orders of the defined types were placed in this encounter.  The patient has a good understanding of the overall plan. she agrees with it. she will call with any problems that may develop before the next visit here.  Total time spent: 12 mins including non-face to face time and time spent for planning, charting and coordination of care  Rulon Eisenmenger, MD, MPH 01/17/2021  I, Molly Dorshimer, am acting as scribe for Dr. Nicholas Lose.  I have reviewed the above documentation for  accuracy and completeness, and I agree with the above.

## 2021-01-17 ENCOUNTER — Inpatient Hospital Stay: Payer: Medicare Other | Attending: Hematology and Oncology | Admitting: Hematology and Oncology

## 2021-01-17 DIAGNOSIS — Z170 Estrogen receptor positive status [ER+]: Secondary | ICD-10-CM | POA: Diagnosis not present

## 2021-01-17 DIAGNOSIS — C50212 Malignant neoplasm of upper-inner quadrant of left female breast: Secondary | ICD-10-CM | POA: Diagnosis not present

## 2021-01-17 MED ORDER — ANASTROZOLE 1 MG PO TABS
1.0000 mg | ORAL_TABLET | Freq: Every day | ORAL | 3 refills | Status: DC
Start: 2021-01-17 — End: 2022-03-11

## 2021-01-22 ENCOUNTER — Telehealth: Payer: Self-pay | Admitting: Hematology and Oncology

## 2021-01-22 NOTE — Telephone Encounter (Signed)
Scheduled per 6/10 los. Pt will receive an appt letter and calendar printout

## 2021-02-04 ENCOUNTER — Encounter: Admit: 2021-02-04 | Payer: MEDICARE | Attending: Physical Medicine and Rehabilitation | Primary: Internal Medicine

## 2021-02-04 ENCOUNTER — Encounter
Admit: 2021-02-04 | Payer: PRIVATE HEALTH INSURANCE | Attending: Physical Medicine and Rehabilitation | Primary: Internal Medicine

## 2021-02-04 DIAGNOSIS — E785 Hyperlipidemia, unspecified: Secondary | ICD-10-CM

## 2021-02-04 DIAGNOSIS — M25562 Pain in left knee: Secondary | ICD-10-CM

## 2021-02-04 DIAGNOSIS — M25561 Pain in right knee: Secondary | ICD-10-CM

## 2021-02-04 DIAGNOSIS — M1712 Unilateral primary osteoarthritis, left knee: Secondary | ICD-10-CM

## 2021-02-04 DIAGNOSIS — I10 Essential (primary) hypertension: Secondary | ICD-10-CM

## 2021-02-04 NOTE — Progress Notes
[place] [email or web address] Name:  Ruthanne Mcneish Patient MRN:  [id number] DOB:  1947/04/07Provider:  Dr. Berkley Harvey Provider:  TokarzPrimary Care Physician:  Ellissa, Ayo [facility] Complaint Patient presents with ? Procedure   Pt reports her knee hurts her on the outside. Pain is a 6 today   FOLLOW-UPSubjective:Arnetia Gorder is a 75 y.o. year old female who presents to the clinic today with chief complaint(s) as above. The patient was last seen on 12/12/2020 for left knee pain.  She previously received a left knee IA steroid injection which she stated was helpful for approximately a month, then the pain returned.  She received a Synvisc injection in clinic.02/04/2021 visit:A telephone interpreter was available to translate for the patient.The patient complains of continued left knee pain.  She states that the previous injection she no longer has pain in left knee at rest, but complains of pain when standing from sitting or with prolonged standing.  She would like to delay a knee replacement as long as possible.She states she has had a total knee replacement of the right knee.Following up for: Left knee pain.Response to treatment recommendations: Positive, pain only with prolonged standing or with standing from sitting.===Past Medical History: Diagnosis Date ? Hypertension  ? Other and unspecified hyperlipidemia 11/06/2014 ? Pain in left knee 08/09/2014 ? Pain in right knee 09/27/2014 Past Surgical History: Procedure Laterality Date ? CATARACT EXTRACTION Bilateral 2013 ? JOINT REPLACEMENT   ? TOTAL KNEE ARTHROPLASTY Right 07/2015 Current Medications Taking:Current Outpatient Medications Medication Sig ? blood sugar diagnostic 100 each by Other route daily. Once daily E11.69 E78.9 ? blood-glucose meter 1 each by Other route See Admin Instructions. Once Daily E11.69 E78.5 ? diclofenac Apply topically 4 (four) times daily. ? lancets 100 each by Misc.(Non-Drug; Combo Route) route daily. E11.69 E78.5 ? lisinopriL-hydrochlorothiazide Take 1 tablet by mouth daily. ? metFORMIN Take 1 tablet (850 mg total) by mouth 2 (two) times daily with breakfast and dinner. ? simvastatin Take 1 tablet (20 mg total) by mouth daily. ? calcium carbonate-vitamin D3 Take 1 tablet by mouth 2 (two) times daily with breakfast and dinner (Patient not taking: No sig reported) No current facility-administered medications for this visit.  No Known AllergiesFamily History Problem Relation Age of Onset ? No Known Problems Mother  ? No Known Problems Father  ? No Known Problems Sister  ? No Known Problems Brother  ? No Known Problems Maternal Grandmother  ? No Known Problems Maternal Grandfather  ? No Known Problems Paternal Grandmother  ? No Known Problems Paternal Grandfather  ? No Known Problems Cousin  Social History Tobacco Use ? Smoking status: Never Smoker ? Smokeless tobacco: Never Used Substance Use Topics ? Alcohol use: No ? Drug use: No Review of Systems:Review of Systems Musculoskeletal: Positive for joint pain (Left knee pain). 14 point ROS reviewed and pertinent positives listed above, the rest are reviewed and confirmed to be negative.Objective:Vital Signs: Resp. rate 16, height 4' 11 (1.499 m), weight 57.6 kg.Physical Exam:Gen: Well developed. No acute distress.Eyes: Conjunctivae clear. No lid lagCV: Non-cyanotic.Chest: Non-labored breathing, good respiratory effort. Lymph: No visible regional lymphadenopathy. No gross edema noted. Skin: Visualized portions intact and no rashes or ecchymosis noted.Neuro/Psych: Alert and well-oriented. Mood/Affect: appropriate.YQM:VHQI Musculoskeletal ExamGait  Antalgic: leftInspection  Inspection - Left    Edema: mild  Palpation  Palpation - Left    Tenderness: present        Medial joint line: mild  Range of Motion  Range of Motion - Left  Left knee range of motion is normal and full.  Instability  Instability Signs - Left    Varus stress grade: normal    Valgus stress grade: 1+    Lachman: negativeDiagnostic Imaging: No results found.Other Diagnostic Studies:IMPRESSION:  ICD-10-CM  1. Primary osteoarthritis of left knee  M17.12  Ezell Sampley is a 75 y.o. female here with the following issues:Left knee severe OA.  Synvisc injection in 11/2020 provided great relief but she still has stiffness and pain with prolong standing.  We will augment the effect with a hinged knee medial off-loader orthotic. She may return in 06/2021 for repeat injection. If she experiences any problems before this, she may return sooner at which time we will discuss a referral for a total knee replacement.PLAN:-Recommend:  Hinged knee medial off-loader orthotic.The risks, benefits, alternative treatment options and prognosis were discussed and all of patient's questions/concerns were addressed.Follow-up:   Return in about 18 weeks (around 06/10/2021).May certainly follow up sooner if needed.  Inez Catalina DOAssistant Professor of Clinical Orthopaedics and [employer] of MedicineDept of Orthopaedics & Rehabilitation Division of Physical Medicine & RehabilitationScribed for Inez Catalina, MD by Mammie Lorenzo, medical scribe June 28, 2022The documentation recorded by the scribe accurately reflects the services I personally performed and the decisions made by me. I reviewed and confirmed all material entered and/or pre-charted by the scribe.

## 2021-02-17 ENCOUNTER — Telehealth
Admit: 2021-02-17 | Payer: PRIVATE HEALTH INSURANCE | Attending: Physical Medicine and Rehabilitation | Primary: Internal Medicine

## 2021-02-17 NOTE — Telephone Encounter
Pt granddaughter calling,she is checking the status on referral to [facility] clinic in [place],she can be reached [phone]

## 2021-03-17 NOTE — Telephone Encounter
02/04/21 office note from Dr. Audria Nine states:Left knee severe OA.  Synvisc injection in 11/2020 provided great relief but she still has stiffness and pain with prolong standing.  We will augment the effect with a hinged knee medial off-loader orthotic. She may return in 06/2021 for repeat injection. If she experiences any problems before this, she may return sooner at which time we will discuss a referral for a total knee replacement.?PLAN:-Recommend:? Hinged knee medial off-loader orthotic.Office note and face sheet faxed [facility] [phone]). LMOM for pt's granddaughter to inform and to give office number to [facility] [phone])

## 2021-05-22 ENCOUNTER — Encounter: Admit: 2021-05-22 | Payer: PRIVATE HEALTH INSURANCE | Primary: Internal Medicine

## 2021-05-22 DIAGNOSIS — R195 Other fecal abnormalities: Secondary | ICD-10-CM

## 2021-05-22 DIAGNOSIS — D649 Anemia, unspecified: Secondary | ICD-10-CM

## 2021-05-29 ENCOUNTER — Inpatient Hospital Stay: Admit: 2021-05-29 | Discharge: 2021-05-29 | Payer: MEDICARE | Primary: Internal Medicine

## 2021-05-29 DIAGNOSIS — R195 Other fecal abnormalities: Secondary | ICD-10-CM

## 2021-05-30 DIAGNOSIS — D649 Anemia, unspecified: Secondary | ICD-10-CM

## 2021-06-02 LAB — H. PYLORI BREATH TEST: BKR H. PYLORI BREATH TEST: NEGATIVE

## 2021-06-05 ENCOUNTER — Encounter: Admit: 2021-06-05 | Payer: MEDICARE | Attending: Physical Medicine and Rehabilitation | Primary: Internal Medicine

## 2021-06-12 ENCOUNTER — Encounter: Admit: 2021-06-12 | Payer: MEDICARE | Attending: Physical Medicine and Rehabilitation | Primary: Internal Medicine

## 2021-07-21 ENCOUNTER — Ambulatory Visit: Payer: Medicare Other | Admitting: Orthopedic Surgery

## 2021-07-30 DIAGNOSIS — M109 Gout, unspecified: Secondary | ICD-10-CM | POA: Diagnosis present

## 2021-07-30 DIAGNOSIS — I739 Peripheral vascular disease, unspecified: Secondary | ICD-10-CM | POA: Insufficient documentation

## 2021-07-30 DIAGNOSIS — N182 Chronic kidney disease, stage 2 (mild): Secondary | ICD-10-CM | POA: Diagnosis present

## 2021-07-30 DIAGNOSIS — K219 Gastro-esophageal reflux disease without esophagitis: Secondary | ICD-10-CM | POA: Diagnosis present

## 2021-08-22 ENCOUNTER — Other Ambulatory Visit: Payer: Self-pay | Admitting: Family Medicine

## 2021-08-22 DIAGNOSIS — Z1231 Encounter for screening mammogram for malignant neoplasm of breast: Secondary | ICD-10-CM

## 2021-09-11 ENCOUNTER — Ambulatory Visit
Admission: RE | Admit: 2021-09-11 | Discharge: 2021-09-11 | Disposition: A | Discharge status: Home / Self Care | Payer: Medicare Other | Source: Ambulatory Visit | Attending: Family Medicine | Admitting: Family Medicine

## 2021-09-11 ENCOUNTER — Ambulatory Visit: Payer: Medicare Other

## 2021-09-11 DIAGNOSIS — Z1231 Encounter for screening mammogram for malignant neoplasm of breast: Secondary | ICD-10-CM

## 2021-10-08 ENCOUNTER — Encounter: Admit: 2021-10-08 | Payer: PRIVATE HEALTH INSURANCE | Attending: Internal Medicine | Primary: Internal Medicine

## 2021-10-08 MED ORDER — METFORMIN IMMEDIATE RELEASE 850 MG TABLET
850 | ORAL_TABLET | ORAL | 5 refills | 90.00000 days | Status: AC
Start: 2021-10-08 — End: 2022-12-25

## 2021-11-18 ENCOUNTER — Encounter: Admit: 2021-11-18 | Payer: PRIVATE HEALTH INSURANCE | Primary: Internal Medicine

## 2021-12-02 ENCOUNTER — Encounter: Admit: 2021-12-02 | Payer: PRIVATE HEALTH INSURANCE | Attending: Internal Medicine | Primary: Internal Medicine

## 2021-12-02 ENCOUNTER — Encounter: Admit: 2021-12-02 | Payer: MEDICARE | Attending: Internal Medicine | Primary: Internal Medicine

## 2021-12-02 MED ORDER — SITAGLIPTIN PHOSPHATE 100 MG TABLET
100 | ORAL_TABLET | Freq: Every day | ORAL | 4 refills | 90.00000 days | Status: AC
Start: 2021-12-02 — End: 2022-02-04

## 2021-12-10 ENCOUNTER — Encounter: Admit: 2021-12-10 | Payer: PRIVATE HEALTH INSURANCE | Attending: Internal Medicine | Primary: Internal Medicine

## 2021-12-11 ENCOUNTER — Encounter: Admit: 2021-12-11 | Payer: PRIVATE HEALTH INSURANCE | Attending: Internal Medicine | Primary: Internal Medicine

## 2021-12-11 MED ORDER — FREESTYLE LITE STRIPS
4 refills | Status: AC
Start: 2021-12-11 — End: 2021-12-11

## 2021-12-11 MED ORDER — BLOOD SUGAR DIAGNOSTIC STRIPS
Freq: Two times a day (BID) | 4 refills | 50.00000 days | Status: AC
Start: 2021-12-11 — End: 2022-01-01

## 2021-12-31 ENCOUNTER — Encounter: Admit: 2021-12-31 | Payer: PRIVATE HEALTH INSURANCE | Attending: Internal Medicine | Primary: Internal Medicine

## 2022-01-01 ENCOUNTER — Encounter: Admit: 2022-01-01 | Payer: PRIVATE HEALTH INSURANCE | Attending: Internal Medicine | Primary: Internal Medicine

## 2022-01-01 ENCOUNTER — Telehealth: Admit: 2022-01-01 | Payer: PRIVATE HEALTH INSURANCE | Attending: Internal Medicine | Primary: Internal Medicine

## 2022-01-01 MED ORDER — BLOOD SUGAR DIAGNOSTIC STRIPS
Freq: Every day | 4 refills | 50.00000 days | Status: AC
Start: 2022-01-01 — End: 2022-01-01

## 2022-01-01 MED ORDER — BLOOD SUGAR DIAGNOSTIC STRIPS
Freq: Every day | 4 refills | 50.00000 days | Status: AC
Start: 2022-01-01 — End: 2022-01-07

## 2022-01-06 ENCOUNTER — Encounter: Admit: 2022-01-06 | Payer: PRIVATE HEALTH INSURANCE | Attending: Internal Medicine | Primary: Internal Medicine

## 2022-01-06 MED ORDER — LISINOPRIL 20 MG-HYDROCHLOROTHIAZIDE 12.5 MG TABLET
20-12.5 | ORAL_TABLET | ORAL | 4 refills | 90.00000 days | Status: AC
Start: 2022-01-06 — End: 2022-12-21

## 2022-01-06 MED ORDER — SIMVASTATIN 20 MG TABLET
20 | ORAL_TABLET | ORAL | 4 refills | 90.00000 days | Status: AC
Start: 2022-01-06 — End: 2022-12-21

## 2022-01-07 ENCOUNTER — Encounter: Admit: 2022-01-07 | Payer: PRIVATE HEALTH INSURANCE | Attending: Internal Medicine | Primary: Internal Medicine

## 2022-01-07 MED ORDER — BLOOD SUGAR DIAGNOSTIC STRIPS
4 refills | 50.00000 days | Status: AC
Start: 2022-01-07 — End: 2022-02-04

## 2022-01-19 ENCOUNTER — Ambulatory Visit: Payer: Medicare Other | Admitting: Hematology and Oncology

## 2022-01-22 ENCOUNTER — Encounter: Admit: 2022-01-22 | Payer: PRIVATE HEALTH INSURANCE | Attending: Orthopedic Surgery | Primary: Internal Medicine

## 2022-01-22 DIAGNOSIS — M1712 Unilateral primary osteoarthritis, left knee: Secondary | ICD-10-CM

## 2022-01-31 ENCOUNTER — Encounter: Admit: 2022-01-31 | Payer: PRIVATE HEALTH INSURANCE | Attending: Internal Medicine | Primary: Internal Medicine

## 2022-02-02 ENCOUNTER — Inpatient Hospital Stay: Admit: 2022-02-02 | Discharge: 2022-02-02 | Payer: MEDICARE | Primary: Internal Medicine

## 2022-02-02 DIAGNOSIS — M1712 Unilateral primary osteoarthritis, left knee: Secondary | ICD-10-CM

## 2022-02-02 LAB — STAPH. AUREUS SCREEN BY PCR (PRE OP)  (GH LMW YH)
BKR MRSA NARES PCR: NEGATIVE
BKR SAUR NARES PCR: NEGATIVE

## 2022-02-02 LAB — MRSA BY PCR- VANCO RX (PHARMACY USE ONLY) (YH): BKR MRSA COLONIZATION STATUS PCR: NOT DETECTED

## 2022-02-02 LAB — CBC WITH AUTO DIFFERENTIAL
BKR WAM ABSOLUTE IMMATURE GRANULOCYTES.: 0.01 x 1000/??L (ref 0.00–0.30)
BKR WAM ABSOLUTE LYMPHOCYTE COUNT.: 2.01 x 1000/??L (ref 0.60–3.70)
BKR WAM ABSOLUTE NRBC (2 DEC): 0 x 1000/ÂµL (ref 0.00–1.00)
BKR WAM ANALYZER ANC: 3.75 x 1000/??L (ref 2.00–7.60)
BKR WAM BASOPHIL ABSOLUTE COUNT.: 0.02 x 1000/??L (ref 0.00–1.00)
BKR WAM BASOPHILS: 0.3 % (ref 0.0–1.4)
BKR WAM EOSINOPHIL ABSOLUTE COUNT.: 0.23 x 1000/??L (ref 0.00–1.00)
BKR WAM EOSINOPHILS: 3.5 % (ref 0.0–5.0)
BKR WAM HEMATOCRIT (2 DEC): 34.5 % — ABNORMAL LOW (ref 35.00–45.00)
BKR WAM HEMOGLOBIN: 10.7 g/dL — ABNORMAL LOW (ref 11.7–15.5)
BKR WAM IMMATURE GRANULOCYTES: 0.2 % (ref 0.0–1.0)
BKR WAM LYMPHOCYTES: 31 % (ref 17.0–50.0)
BKR WAM MCH (PG): 28.8 pg (ref 27.0–33.0)
BKR WAM MCHC: 31 g/dL (ref 31.0–36.0)
BKR WAM MCV: 93 fL (ref 80.0–100.0)
BKR WAM MONOCYTE ABSOLUTE COUNT.: 0.47 x 1000/??L (ref 0.00–1.00)
BKR WAM MONOCYTES: 7.2 % (ref 4.0–12.0)
BKR WAM MPV: 12.3 fL — ABNORMAL HIGH (ref 8.0–12.0)
BKR WAM NEUTROPHILS: 57.8 % (ref 39.0–72.0)
BKR WAM NUCLEATED RED BLOOD CELLS: 0 % (ref 0.0–1.0)
BKR WAM PLATELETS: 273 x1000/??L (ref 150–420)
BKR WAM RDW-CV: 13.7 % (ref 11.0–15.0)
BKR WAM RED BLOOD CELL COUNT.: 3.71 M/ÂµL — ABNORMAL LOW (ref 4.00–6.00)
BKR WAM WHITE BLOOD CELL COUNT: 6.5 x1000/ÂµL (ref 4.0–11.0)

## 2022-02-02 LAB — BASIC METABOLIC PANEL
BKR ANION GAP: 15 (ref 7–17)
BKR BLOOD UREA NITROGEN: 22 mg/dL (ref 8–23)
BKR BUN / CREAT RATIO: 18.8 (ref 8.0–23.0)
BKR CALCIUM: 9.7 mg/dL (ref 8.8–10.2)
BKR CHLORIDE: 103 mmol/L (ref 98–107)
BKR CO2: 22 mmol/L (ref 20–30)
BKR CREATININE: 1.17 mg/dL (ref 0.40–1.30)
BKR EGFR, CREATININE (CKD-EPI 2021): 49 mL/min/{1.73_m2} — ABNORMAL LOW (ref >=60)
BKR GLUCOSE: 258 mg/dL — ABNORMAL HIGH (ref 70–100)
BKR POTASSIUM: 4.5 mmol/L (ref 3.3–5.3)
BKR SODIUM: 140 mmol/L (ref 136–144)

## 2022-02-02 LAB — PROTIME AND INR
BKR INR: 1.05 (ref 0.92–1.19)
BKR PROTHROMBIN TIME: 10.9 s (ref 9.6–12.3)

## 2022-02-02 MED ORDER — DICLOFENAC 1 % TOPICAL GEL
1 | TOPICAL | 12 refills | 25.00000 days | Status: AC
Start: 2022-02-02 — End: ?

## 2022-02-02 NOTE — Progress Notes (Signed)
HEMATOLOGY-ONCOLOGY TELEPHONE VISIT PROGRESS NOTE  I connected with $RemoveBefore'@PTNAME'jrjgrFtYhhNDx$ @ on 02/16/22 at 11:15 AM EDT by telephone and verified that I am speaking with the correct person using two identifiers.  I discussed the limitations, risks, security and privacy concerns of performing an evaluation and management service by telephone and the availability of in person appointments.  I also discussed with the patient that there may be a patient responsible charge related to this service. The patient expressed understanding and agreed to proceed.   History of Present Illness: : Gloria Mckinney is a 76 y.o. with above-mentioned history of left breast cancer treated with lumpectomy, adjuvant chemotherapy, radiation, Herceptin maintenance, and is currently on antiestrogen therapy with anastrozole. She presents to the clinic via telephone follow-up. Mouth and throat dryness   Oncology History  Malignant neoplasm of upper-inner quadrant of left breast in female, estrogen receptor positive ([place])  07/18/2018 Initial Diagnosis   Screening detected left breast mass at 10 o'clock position 1.8 cm axilla negative, biopsy revealed grade 3 IDC ER 80%, PR 70%, Ki-67 40%, HER-2 +3+ by IHC with heterogeneity, T1c N0 stage Ia clinical stage   08/15/2018 Surgery   Left Lumpectomy [facility]) [phone]): IDC grade 3, 1.9 cm, Posterior margin positive (Muscle) due to a transected satellite nodule 0.5 cm, 3 lymph nodes negative for carcinoma, ER 80%, PR 70%, Ki-67 40%, HER-2 +3+ by IHC with heterogeneity T1cN0 Stage 1A.    08/24/2018 Cancer Staging   Staging form: Breast, AJCC 8th Edition - Pathologic: Stage IA (pT1b, pN0, cM0, G3, ER+, PR+, HER2+)     09/09/2018 - 08/03/2019 Chemotherapy   trastuzumab (HERCEPTIN) 300 mg in sodium chloride 0.9 % 250 mL chemo infusion, 294 mg, Intravenous,  Once, 8 of 8 cycles. Administration: 300 mg (09/09/2018), 150 mg (09/19/2018), 150 mg (10/10/2018), 450 mg (12/19/2018), 450 mg  (01/09/2019), 450 mg (03/13/2019), 150 mg (09/26/2018), 150 mg (10/03/2018), 150 mg (10/17/2018), 150 mg (10/24/2018), 150 mg (10/31/2018), 150 mg (11/07/2018), 150 mg (11/14/2018), 150 mg (11/21/2018), 450 mg (11/28/2018), 450 mg (01/30/2019), 450 mg (02/20/2019)  PACLitaxel (TAXOL) 150 mg in sodium chloride 0.9 % 250 mL chemo infusion (</= $RemoveBefor'80mg'YmdzrtCrdAhy$ /m2), 80 mg/m2 = 150 mg, Intravenous,  Once, 3 of 3 cycles. Dose modification: 60 mg/m2 (original dose 80 mg/m2, Cycle 2, Reason: Dose not tolerated). Administration: 150 mg (09/09/2018), 150 mg (09/19/2018), 150 mg (10/10/2018), 150 mg (09/26/2018), 150 mg (10/03/2018), 114 mg (10/17/2018), 114 mg (10/24/2018), 114 mg (10/31/2018), 114 mg (11/07/2018), 114 mg (11/14/2018), 114 mg (11/21/2018), 114 mg (11/28/2018)  trastuzumab-dkst (OGIVRI) 450 mg in sodium chloride 0.9 % 250 mL chemo infusion, 450 mg (100 % of original dose 450 mg), Intravenous,  Once, 7 of 7 cycles. Dose modification: 450 mg (original dose 450 mg, Cycle 9, Reason: Other (see comments), Comment: Biosimilar Conversion). Administration: 450 mg (04/03/2019), 450 mg (04/24/2019), 450 mg (05/15/2019), 450 mg (06/05/2019), 450 mg (06/26/2019), 450 mg (07/17/2019), 450 mg (08/03/2019)    01/16/2019 - 02/13/2019 Radiation Therapy   The patient initially received a dose of 40.05 Gy in 15 fractions to the breast using whole-breast tangent fields. This was delivered using a 3-D conformal technique. The pt received a boost delivering an additional 10 Gy in 5 fractions using a electron boost with 38meV electrons. The total dose was 50.05 Gy.    08/2019 - 08/2024 Anti-estrogen oral therapy   Anastrozole     REVIEW OF SYSTEMS:   Constitutional: Denies fevers, chills or abnormal weight loss All other systems were reviewed with the patient  and are negative.  Observations/Objective:     Assessment Plan:  Malignant neoplasm of upper-inner quadrant of left breast in female, estrogen receptor positive ([place])   I suspect 08/15/18: Left  Lumpectomy: IDC grade 3, 1.9 cm, Posterior margin positive (Muscle) due to a transected satellite nodule 0.5 cm, 0/3 LN Neg, ER 80%, PR 70%, Ki-67 40%, HER-2 +3+ by IHC with heterogeneity T1cN0 Stage 1A   Posterior Margin: based on discussions with surgery, it is muscle and hence no further surgery is possible.   Plan: 1. Adjuvant Taxol Herceptin weekly x12 completed 11/28/2018 followed by Herceptin maintenance for 1 year to be completed 08/03/2019 2.  Adjuvant radiation therapy completed 02/13/2019 3.  Followed by adjuvant antiestrogen therapy to start January 2021 --------------------------------------------------------------------------------------------------------------------------------------------------------- Current treatment: Adjuvant Herceptin completed 08/03/2019, adjuvant anastrozole started January 2021 Adjuvant radiation completed 02/13/2019 Echocardiogram 11/29/2018: EF 55 to 60%   Iron deficiency anemia:  IV iron given 04/24/2019 currently on oral iron which will be completed in December 2020 hemoglobin improved to 13.7   Anastrozole toxicities: tolerating it well. No side effects Dryness of Mouth: Advised on Biotene   Breast cancer surveillance: Mammogram: Feb 2022: Benign breast density category D   She wants a knee replacement surgery.   Return to clinic in 1 year for follow-up   I discussed the assessment and treatment plan with the patient. The patient was provided an opportunity to ask questions and all were answered. The patient agreed with the plan and demonstrated an understanding of the instructions. The patient was advised to call back or seek an in-person evaluation if the symptoms worsen or if the condition fails to improve as anticipated.   I provided 12 minutes of non-face-to-face time during this encounter. Harriette Ohara, MD I Gardiner Coins am scribing for Dr. Lindi Adie  I have reviewed the above documentation for accuracy and completeness, and I agree with  the above.

## 2022-02-04 ENCOUNTER — Ambulatory Visit: Admit: 2022-02-04 | Payer: MEDICARE | Attending: Internal Medicine | Primary: Internal Medicine

## 2022-02-04 ENCOUNTER — Encounter: Admit: 2022-02-04 | Payer: PRIVATE HEALTH INSURANCE | Attending: Internal Medicine | Primary: Internal Medicine

## 2022-02-04 MED ORDER — FREESTYLE LITE STRIPS
4 refills | Status: AC
Start: 2022-02-04 — End: 2022-12-21

## 2022-02-04 MED ORDER — SITAGLIPTIN PHOSPHATE 100 MG TABLET
100 | ORAL_TABLET | Freq: Every day | ORAL | 4 refills | 90.00000 days | Status: AC
Start: 2022-02-04 — End: 2022-12-23

## 2022-02-07 ENCOUNTER — Other Ambulatory Visit: Payer: Self-pay | Admitting: Nurse Practitioner

## 2022-02-12 ENCOUNTER — Telehealth: Payer: Self-pay

## 2022-02-12 NOTE — Telephone Encounter (Signed)
Received surgical clearance form from Dr Berenice Primas office. MD signed and we faxed back with fax confirmation. Pt's daughter is aware.

## 2022-02-16 ENCOUNTER — Inpatient Hospital Stay: Payer: Medicare Other | Attending: Hematology and Oncology | Admitting: Hematology and Oncology

## 2022-02-16 ENCOUNTER — Ambulatory Visit: Payer: Medicare Other | Admitting: Hematology and Oncology

## 2022-02-16 DIAGNOSIS — Z170 Estrogen receptor positive status [ER+]: Secondary | ICD-10-CM | POA: Diagnosis not present

## 2022-02-16 DIAGNOSIS — C50212 Malignant neoplasm of upper-inner quadrant of left female breast: Secondary | ICD-10-CM | POA: Diagnosis not present

## 2022-02-16 NOTE — Assessment & Plan Note (Addendum)
   I suspect 08/15/18:Left Lumpectomy: IDC grade 3, 1.9 cm, Posterior margin positive (Muscle) due to a transected satellite nodule 0.5 cm, 0/3 LN Neg, ER 80%, PR 70%, Ki-67 40%, HER-2 +3+ by IHC with heterogeneity T1cN0 Stage 1A  Posterior Margin: based on discussions with surgery, it is muscle and hence no further surgery is possible.  Plan: 1.Adjuvant Taxol Herceptin weekly x12completed 4/20/2020followed by Herceptin maintenance for 1 yearto be completed12/24/2020 2.Adjuvant radiation therapycompleted 02/13/2019 3.Followed by adjuvant antiestrogen therapyto start January 2021 --------------------------------------------------------------------------------------------------------------------------------------------------------- Current treatment: Adjuvant Herceptin completed12/24/2020, adjuvant anastrozole started January 2021 Adjuvant radiationcompleted 02/13/2019 Echocardiogram 11/29/2018: EF 55 to 60%  Iron deficiencyanemia:IV iron given 9/14/2020currently on oral iron which will be completed in December 2020 hemoglobin improved to 13.7  Anastrozole toxicities:tolerating it well. No side effects  Breast cancer surveillance: Mammogram: January 2022: Benign breast density category D  Return to clinic in 1 year for follow-up

## 2022-02-24 ENCOUNTER — Encounter: Admit: 2022-02-24 | Payer: PRIVATE HEALTH INSURANCE | Attending: Internal Medicine | Primary: Internal Medicine

## 2022-03-02 ENCOUNTER — Encounter: Payer: Self-pay | Admitting: *Deleted

## 2022-03-02 NOTE — Progress Notes (Signed)
RN successfully faxed completed Preoperative Risk assessment to [facility] for recommendations prior to upcoming Right Total Knee replacement.

## 2022-03-11 ENCOUNTER — Other Ambulatory Visit: Payer: Self-pay | Admitting: Hematology and Oncology

## 2022-03-30 ENCOUNTER — Encounter: Admit: 2022-03-30 | Payer: PRIVATE HEALTH INSURANCE | Attending: Internal Medicine | Primary: Internal Medicine

## 2022-04-07 ENCOUNTER — Ambulatory Visit: Payer: Medicare Other | Attending: Orthopedic Surgery

## 2022-04-07 DIAGNOSIS — R2689 Other abnormalities of gait and mobility: Secondary | ICD-10-CM | POA: Insufficient documentation

## 2022-04-07 DIAGNOSIS — M25561 Pain in right knee: Secondary | ICD-10-CM | POA: Diagnosis present

## 2022-04-07 DIAGNOSIS — M25661 Stiffness of right knee, not elsewhere classified: Secondary | ICD-10-CM | POA: Insufficient documentation

## 2022-04-07 DIAGNOSIS — M6281 Muscle weakness (generalized): Secondary | ICD-10-CM | POA: Diagnosis present

## 2022-04-07 NOTE — Therapy (Signed)
OUTPATIENT PHYSICAL THERAPY LOWER EXTREMITY EVALUATION   Patient Name: Gloria Mckinney MRN: [id number] DOB:24-May-1946, 76 y.o., female Today's Date: 04/07/2022    Past Medical History:  Diagnosis Date   Cancer ([place])    GERD (gastroesophageal reflux disease)    History of radiation therapy 01/16/19- 02/13/19   left breast 15 fractions of 2.67 Gy for a total of 40.05 Gy. Left breast boost 5 fractions of 2 Gy for a total of 10 Gy   Hypertension    Mixed hyperlipidemia    OA (osteoarthritis)    knee right   Peripheral neuropathy    PVD (peripheral vascular disease) ([place])    Type 2 diabetes mellitus treated with insulin [facility])    endocrinologist--- dr Providence Crosby Scotto   Past Surgical History:  Procedure Laterality Date   BREAST BIOPSY Bilateral 2000   benign   BREAST LUMPECTOMY WITH RADIOACTIVE SEED AND SENTINEL LYMPH NODE BIOPSY Left 08/15/2018   Procedure: LEFT BREAST LUMPECTOMY WITH RADIOACTIVE SEED AND LEFT AXILLARY DEEP SENTINEL LYMPH NODE BIOPSY INJECT BLUE DYE LEFT BREAST;  Surgeon: Fanny Skates, MD;  Location: [place];  Service: General;  Laterality: Left;   PORT-A-CATH REMOVAL Right 08/09/2019   Procedure: REMOVAL PORT-A-CATH;  Surgeon: Fanny Skates, MD;  Location: [place];  Service: General;  Laterality: Right;   PORTACATH PLACEMENT Right 08/15/2018   Procedure: INSERTION PORT-A-CATH WITH ULTRASOUND;  Surgeon: Fanny Skates, MD;  Location: [place];  Service: General;  Laterality: Right;   SHOULDER ARTHROSCOPY Right 06-16-2002   dr graves   SHOULDER OPEN ROTATOR CUFF REPAIR Right 07-28-2000    dr graves   VAGINAL HYSTERECTOMY  1992   with [place]  2007   Patient Active Problem List   Diagnosis Date Noted   Port-A-Cath in place 09/19/2018   Malignant neoplasm of upper-inner quadrant of left breast in female, estrogen receptor positive ([place]) 07/27/2018   Dyspnea on exertion 10/21/2016   Type 2  diabetes mellitus without complication, without long-term current use of insulin ([place]) 10/21/2016   Dyslipidemia 10/21/2016    PCP: [name]  REFERRING PROVIDER: Dorna Leitz   REFERRING DIAG: R TKA   THERAPY DIAG:  No diagnosis found.  Rationale for Evaluation and Treatment Rehabilitation  ONSET DATE: 03/25/22  SUBJECTIVE:   SUBJECTIVE STATEMENT: I had knee surgery on 8/16, did home health for 6 visits.   PERTINENT HISTORY: DM, HTN, PVD, shoulder sx, breast cancer sx   PAIN:  Are you having pain? Yes: NPRS scale: 5/10 Pain location: R knee Pain description: burning, achy  Aggravating factors: standing up, sitting, walking for a long time  Relieving factors: oxycodone, celebrix, ice  PRECAUTIONS: Fall  WEIGHT BEARING RESTRICTIONS No  FALLS:  Has patient fallen in last 6 months? No  LIVING ENVIRONMENT: Lives with: lives with their family Lives in: House/apartment Stairs: Yes: Internal: 15 steps; on right going up Has following equipment at home: Gaffer - 2 wheeled  OCCUPATION: retired  PLOF: Independent  PATIENT GOALS no pain, stand up and walk    OBJECTIVE:  COGNITION:  Overall cognitive status: Within functional limits for tasks assessed     SENSATION: WFL   MUSCLE LENGTH: Hamstrings: Right mod tightness  POSTURE: rounded shoulders, forward head, flexed trunk , and weight shift left  PALPATION: Warm, and tender around R knee   LOWER EXTREMITY ROM:  Active ROM Right eval Left eval  Hip flexion    Hip  extension    Hip abduction    Hip adduction    Hip internal rotation    Hip external rotation    Knee flexion 75   Knee extension -20   Ankle dorsiflexion    Ankle plantarflexion    Ankle inversion    Ankle eversion     (Blank rows = not tested)  LOWER EXTREMITY MMT:  MMT Right eval Left eval  Hip flexion 3+   Hip extension    Hip abduction    Hip adduction    Hip internal rotation    Hip  external rotation    Knee flexion 3+    Knee extension 4- w/pain   Ankle dorsiflexion 5   Ankle plantarflexion 5   Ankle inversion    Ankle eversion     (Blank rows = not tested)   FUNCTIONAL TESTS:  5 times sit to stand: 48.13s  Timed up and go (TUG): 29.87s  GAIT: Distance walked: in clinic distances Assistive device utilized: Environmental consultant - 2 wheeled Level of assistance: Modified independence Comments: slowed gait, antalgic pattern, decreased step and stance time on RLE   TODAY'S TREATMENT: POC    PATIENT EDUCATION:  Education details: POC and HEP Person educated: Patient and Child(ren) Education method: Explanation Education comprehension: verbalized understanding and returned demonstration   HOME EXERCISE PROGRAM: Calf raises in RW  Mini squats in RW  Seated LAQ  Supine SAQ   ASSESSMENT:  CLINICAL IMPRESSION: Patient is a 76 y.o. female who was seen today for physical therapy evaluation and treatment for R TKA. She is doing relatively well, ambulating with a RW and presents with good strength. She has deficits in ROM and is having difficulty with ADLs such as toileting due to the low height. Patient is very motivated and doing some exercises at home already. She will benefit from skilled PT intervention to address R knee pain, range, and strength deficits to return to PLOF and complete ADLs without difficulty.   ACTIVITY LIMITATIONS lifting, bending, sitting, standing, squatting, stairs, toileting, hygiene/grooming, and locomotion level  REHAB POTENTIAL: Good  CLINICAL DECISION MAKING: Stable/uncomplicated  EVALUATION COMPLEXITY: Low   GOALS: Goals reviewed with patient? Yes  SHORT TERM GOALS: Target date: 05/12/22  Patient will be independent with initial HEP. Goal status: INITIAL  2.  Patient will report a decrease in R knee pain to 2/10 or better.  Baseline: 5/10 Goal status: INITIAL   LONG TERM GOALS: Target date: 06/16/22  Patient will be  independent with advanced/ongoing HEP to improve outcomes and carryover.  Goal status: INITIAL  2.  Patient will demonstrate improved R knee AROM to >/= 5-120 deg to allow for normal gait, stair mechanics, and to be able to sit and stand from her toilet. Baseline: 20-0-75 Goal status: INITIAL  3.  Patient will demonstrate improved functional LE strength as demonstrated by 4/5 in all weak muscle groups. Goal status: INITIAL  4.  Patient will be able to ambulate 600' with normal gait pattern without increased pain to access community.  Baseline: walking w/RW Goal status: INITIAL  5. Patient will be able to ascend/descend stairs with 1 HR and reciprocal step pattern safely to access home and community.  Goal status: INITIAL  6.  Patient will demonstrate TUG score to <15s w/o AD to decrease fall risk and return to walking in her community  Baseline: 29.87s Goal status: INITIAL  7.  Patient will demonstrate 5xSTS score to < 20s to decrease fall risk and show functional strength  improvement.  Baseline: 48.13s Goal status: INITIAL    PLAN: PT FREQUENCY: 2x/week  PT DURATION: 12 weeks  PLANNED INTERVENTIONS: Therapeutic exercises, Therapeutic activity, Neuromuscular re-education, Balance training, Gait training, Patient/Family education, Self Care, Joint mobilization, Stair training, Electrical stimulation, Cryotherapy, Moist heat, Vasopneumatic device, Ionotophoresis '4mg'$ /ml Dexamethasone, and Manual therapy  PLAN FOR NEXT SESSION: PROM, AAROM (heel slides), SAQ, SLR, bridges, STS, walking w/cane    [employer], PT 04/07/2022, 10:35 AM

## 2022-04-08 ENCOUNTER — Ambulatory Visit: Payer: Medicare Other

## 2022-04-14 ENCOUNTER — Ambulatory Visit: Payer: Medicare Other | Attending: Orthopedic Surgery

## 2022-04-14 DIAGNOSIS — M25661 Stiffness of right knee, not elsewhere classified: Secondary | ICD-10-CM | POA: Insufficient documentation

## 2022-04-14 DIAGNOSIS — R600 Localized edema: Secondary | ICD-10-CM | POA: Diagnosis present

## 2022-04-14 DIAGNOSIS — R2689 Other abnormalities of gait and mobility: Secondary | ICD-10-CM | POA: Diagnosis present

## 2022-04-14 DIAGNOSIS — M6281 Muscle weakness (generalized): Secondary | ICD-10-CM | POA: Insufficient documentation

## 2022-04-14 DIAGNOSIS — M25561 Pain in right knee: Secondary | ICD-10-CM | POA: Diagnosis present

## 2022-04-14 NOTE — Therapy (Signed)
OUTPATIENT PHYSICAL THERAPY LOWER EXTREMITY TREATMENT   Patient Name: Gloria Mckinney MRN: [id number] DOB:Dec 31, 1945, 76 y.o., female Today's Date: 04/14/2022   PT End of Session - 04/14/22 1602     Visit Number 2    Date for PT Re-Evaluation 06/16/22    Authorization Type Medicare    PT Start Time 4098    PT Stop Time 1640    PT Time Calculation (min) 47 min    Activity Tolerance Patient tolerated treatment well    Behavior During Therapy [facility] for tasks assessed/performed             Past Medical History:  Diagnosis Date   Cancer ([place])    GERD (gastroesophageal reflux disease)    History of radiation therapy 01/16/19- 02/13/19   left breast 15 fractions of 2.67 Gy for a total of 40.05 Gy. Left breast boost 5 fractions of 2 Gy for a total of 10 Gy   Hypertension    Mixed hyperlipidemia    OA (osteoarthritis)    knee right   Peripheral neuropathy    PVD (peripheral vascular disease) ([place])    Type 2 diabetes mellitus treated with insulin [facility])    endocrinologist--- dr Providence Crosby Loman   Past Surgical History:  Procedure Laterality Date   BREAST BIOPSY Bilateral 2000   benign   BREAST LUMPECTOMY WITH RADIOACTIVE SEED AND SENTINEL LYMPH NODE BIOPSY Left 08/15/2018   Procedure: LEFT BREAST LUMPECTOMY WITH RADIOACTIVE SEED AND LEFT AXILLARY DEEP SENTINEL LYMPH NODE BIOPSY INJECT BLUE DYE LEFT BREAST;  Surgeon: Fanny Skates, MD;  Location: [place];  Service: General;  Laterality: Left;   PORT-A-CATH REMOVAL Right 08/09/2019   Procedure: REMOVAL PORT-A-CATH;  Surgeon: Fanny Skates, MD;  Location: [place];  Service: General;  Laterality: Right;   PORTACATH PLACEMENT Right 08/15/2018   Procedure: INSERTION PORT-A-CATH WITH ULTRASOUND;  Surgeon: Fanny Skates, MD;  Location: [place];  Service: General;  Laterality: Right;   SHOULDER ARTHROSCOPY Right 06-16-2002   dr graves   SHOULDER OPEN ROTATOR CUFF REPAIR Right  07-28-2000    dr graves   VAGINAL HYSTERECTOMY  1992   with [place]  2007   Patient Active Problem List   Diagnosis Date Noted   Port-A-Cath in place 09/19/2018   Malignant neoplasm of upper-inner quadrant of left breast in female, estrogen receptor positive ([place]) 07/27/2018   Dyspnea on exertion 10/21/2016   Type 2 diabetes mellitus without complication, without long-term current use of insulin ([place]) 10/21/2016   Dyslipidemia 10/21/2016    PCP: [name]  REFERRING PROVIDER: Dorna Leitz   REFERRING DIAG: R TKA   THERAPY DIAG:  Acute pain of right knee  Stiffness of right knee, not elsewhere classified  Muscle weakness (generalized)  Other abnormalities of gait and mobility  Rationale for Evaluation and Treatment Rehabilitation  ONSET DATE: 03/25/22  SUBJECTIVE:   SUBJECTIVE STATEMENT: I am doing okay, having some pain in R knee a 4 or 5/10.   PERTINENT HISTORY: DM, HTN, PVD, shoulder sx, breast cancer sx   PAIN:  Are you having pain? Yes: NPRS scale: 5/10 Pain location: R knee Pain description: burning, achy  Aggravating factors: standing up, sitting, walking for a long time  Relieving factors: oxycodone, celebrix, ice  PRECAUTIONS: Fall  WEIGHT BEARING RESTRICTIONS No  FALLS:  Has patient fallen in last 6 months? No  LIVING ENVIRONMENT: Lives with: lives with their family Lives in: House/apartment Stairs: Yes: Internal: 60  steps; on right going up Has following equipment at home: Gaffer - 2 wheeled  OCCUPATION: retired  PLOF: Independent  PATIENT GOALS no pain, stand up and walk    OBJECTIVE:  COGNITION:  Overall cognitive status: Within functional limits for tasks assessed     SENSATION: WFL   MUSCLE LENGTH: Hamstrings: Right mod tightness  POSTURE: rounded shoulders, forward head, flexed trunk , and weight shift left  PALPATION: Warm, and tender around R knee   LOWER EXTREMITY  ROM:  Active ROM Right eval Left eval  Hip flexion    Hip extension    Hip abduction    Hip adduction    Hip internal rotation    Hip external rotation    Knee flexion 75   Knee extension -20   Ankle dorsiflexion    Ankle plantarflexion    Ankle inversion    Ankle eversion     (Blank rows = not tested)  LOWER EXTREMITY MMT:  MMT Right eval Left eval  Hip flexion 3+   Hip extension    Hip abduction    Hip adduction    Hip internal rotation    Hip external rotation    Knee flexion 3+    Knee extension 4- w/pain   Ankle dorsiflexion 5   Ankle plantarflexion 5   Ankle inversion    Ankle eversion     (Blank rows = not tested)   FUNCTIONAL TESTS:  5 times sit to stand: 48.13s  Timed up and go (TUG): 29.87s  GAIT: Distance walked: in clinic distances Assistive device utilized: Environmental consultant - 2 wheeled Level of assistance: Modified independence Comments: slowed gait, antalgic pattern, decreased step and stance time on RLE   TODAY'S TREATMENT: 04/14/22 Nustep L2 x58mns  PROM flex/ext, patellar mobs  Quad set 2x5 LAQ 2# 2x10 HS curls greenTB 2x10  STS 2x10  Gait training w/o AD  Vaso med, 159ms    PATIENT EDUCATION:  Education details: POC and HEP Person educated: Patient and Child(ren) Education method: Explanation Education comprehension: verbalized understanding and returned demonstration   HOME EXERCISE PROGRAM: Calf raises in RW  Mini squats in RW  Seated LAQ  Supine SAQ   ASSESSMENT:  CLINICAL IMPRESSION: Patient returns to PT with no changes in pain symptoms but is doing better with ambulation. She is using a cane at home and has taken steps without AD too. We focused on light strengthening and ROM today. Requires cues to not lean back with quad exercises to prevent compensations. Ended with vaso to help with swelling and pain management.  ACTIVITY LIMITATIONS lifting, bending, sitting, standing, squatting, stairs, toileting, hygiene/grooming,  and locomotion level  REHAB POTENTIAL: Good  CLINICAL DECISION MAKING: Stable/uncomplicated  EVALUATION COMPLEXITY: Low   GOALS: Goals reviewed with patient? Yes  SHORT TERM GOALS: Target date: 05/12/22  Patient will be independent with initial HEP. Goal status: INITIAL  2.  Patient will report a decrease in R knee pain to 2/10 or better.  Baseline: 5/10 Goal status: INITIAL   LONG TERM GOALS: Target date: 06/16/22  Patient will be independent with advanced/ongoing HEP to improve outcomes and carryover.  Goal status: INITIAL  2.  Patient will demonstrate improved R knee AROM to >/= 5-120 deg to allow for normal gait, stair mechanics, and to be able to sit and stand from her toilet. Baseline: 20-0-75 Goal status: INITIAL  3.  Patient will demonstrate improved functional LE strength as demonstrated by 4/5 in all weak  muscle groups. Goal status: INITIAL  4.  Patient will be able to ambulate 600' with normal gait pattern without increased pain to access community.  Baseline: walking w/RW Goal status: INITIAL  5. Patient will be able to ascend/descend stairs with 1 HR and reciprocal step pattern safely to access home and community.  Goal status: INITIAL  6.  Patient will demonstrate TUG score to <15s w/o AD to decrease fall risk and return to walking in her community  Baseline: 29.87s Goal status: INITIAL  7.  Patient will demonstrate 5xSTS score to < 20s to decrease fall risk and show functional strength improvement.  Baseline: 48.13s Goal status: INITIAL    PLAN: PT FREQUENCY: 2x/week  PT DURATION: 12 weeks  PLANNED INTERVENTIONS: Therapeutic exercises, Therapeutic activity, Neuromuscular re-education, Balance training, Gait training, Patient/Family education, Self Care, Joint mobilization, Stair training, Electrical stimulation, Cryotherapy, Moist heat, Vasopneumatic device, Ionotophoresis '4mg'$ /ml Dexamethasone, and Manual therapy  PLAN FOR NEXT SESSION: PROM,  AAROM (heel slides), SAQ, SLR, bridges, STS, walking w/cane    Andris Baumann, PT 04/14/2022, 4:43 PM

## 2022-04-16 ENCOUNTER — Ambulatory Visit: Payer: Medicare Other | Admitting: Physical Therapy

## 2022-04-20 ENCOUNTER — Ambulatory Visit: Payer: Medicare Other

## 2022-04-20 DIAGNOSIS — M25561 Pain in right knee: Secondary | ICD-10-CM | POA: Diagnosis not present

## 2022-04-20 DIAGNOSIS — M25661 Stiffness of right knee, not elsewhere classified: Secondary | ICD-10-CM

## 2022-04-20 DIAGNOSIS — R2689 Other abnormalities of gait and mobility: Secondary | ICD-10-CM

## 2022-04-20 DIAGNOSIS — M6281 Muscle weakness (generalized): Secondary | ICD-10-CM

## 2022-04-20 NOTE — Therapy (Signed)
OUTPATIENT PHYSICAL THERAPY LOWER EXTREMITY TREATMENT   Patient Name: Gloria Mckinney MRN: [id number] DOB:21-Dec-1945, 76 y.o., female Today's Date: 04/20/2022   PT End of Session - 04/20/22 1618     Visit Number 3    Date for PT Re-Evaluation 06/16/22    Authorization Type Medicare    PT Start Time 1616    PT Stop Time 1658    PT Time Calculation (min) 42 min    Activity Tolerance Patient tolerated treatment well    Behavior During Therapy [facility] for tasks assessed/performed              Past Medical History:  Diagnosis Date   Cancer ([place])    GERD (gastroesophageal reflux disease)    History of radiation therapy 01/16/19- 02/13/19   left breast 15 fractions of 2.67 Gy for a total of 40.05 Gy. Left breast boost 5 fractions of 2 Gy for a total of 10 Gy   Hypertension    Mixed hyperlipidemia    OA (osteoarthritis)    knee right   Peripheral neuropathy    PVD (peripheral vascular disease) ([place])    Type 2 diabetes mellitus treated with insulin [facility])    endocrinologist--- dr Providence Crosby Icenhower   Past Surgical History:  Procedure Laterality Date   BREAST BIOPSY Bilateral 2000   benign   BREAST LUMPECTOMY WITH RADIOACTIVE SEED AND SENTINEL LYMPH NODE BIOPSY Left 08/15/2018   Procedure: LEFT BREAST LUMPECTOMY WITH RADIOACTIVE SEED AND LEFT AXILLARY DEEP SENTINEL LYMPH NODE BIOPSY INJECT BLUE DYE LEFT BREAST;  Surgeon: Fanny Skates, MD;  Location: [place];  Service: General;  Laterality: Left;   PORT-A-CATH REMOVAL Right 08/09/2019   Procedure: REMOVAL PORT-A-CATH;  Surgeon: Fanny Skates, MD;  Location: [place];  Service: General;  Laterality: Right;   PORTACATH PLACEMENT Right 08/15/2018   Procedure: INSERTION PORT-A-CATH WITH ULTRASOUND;  Surgeon: Fanny Skates, MD;  Location: [place];  Service: General;  Laterality: Right;   SHOULDER ARTHROSCOPY Right 06-16-2002   dr graves   SHOULDER OPEN ROTATOR CUFF REPAIR Right  07-28-2000    dr graves   VAGINAL HYSTERECTOMY  1992   with [place]  2007   Patient Active Problem List   Diagnosis Date Noted   Port-A-Cath in place 09/19/2018   Malignant neoplasm of upper-inner quadrant of left breast in female, estrogen receptor positive ([place]) 07/27/2018   Dyspnea on exertion 10/21/2016   Type 2 diabetes mellitus without complication, without long-term current use of insulin ([place]) 10/21/2016   Dyslipidemia 10/21/2016    PCP: [name]  REFERRING PROVIDER: Dorna Leitz   REFERRING DIAG: R TKA   THERAPY DIAG:  Acute pain of right knee  Stiffness of right knee, not elsewhere classified  Muscle weakness (generalized)  Other abnormalities of gait and mobility  Rationale for Evaluation and Treatment Rehabilitation  ONSET DATE: 03/25/22  SUBJECTIVE:   SUBJECTIVE STATEMENT: I am doing okay, walking with cane. I can walk without it now too.   PERTINENT HISTORY: DM, HTN, PVD, shoulder sx, breast cancer sx   PAIN:  Are you having pain? Yes: NPRS scale: 4/10 Pain location: R knee Pain description: burning, achy  Aggravating factors: standing up, sitting, walking for a long time  Relieving factors: oxycodone, celebrix, ice  PRECAUTIONS: Fall  WEIGHT BEARING RESTRICTIONS No  FALLS:  Has patient fallen in last 6 months? No  LIVING ENVIRONMENT: Lives with: lives with their family Lives in: House/apartment Stairs: Yes: Internal:  15 steps; on right going up Has following equipment at home: Gaffer - 2 wheeled  OCCUPATION: retired  PLOF: Independent  PATIENT GOALS no pain, stand up and walk    OBJECTIVE:  COGNITION:  Overall cognitive status: Within functional limits for tasks assessed     SENSATION: WFL   MUSCLE LENGTH: Hamstrings: Right mod tightness  POSTURE: rounded shoulders, forward head, flexed trunk , and weight shift left  PALPATION: Warm, and tender around R knee   LOWER EXTREMITY  ROM:  Active ROM Right eval Left eval  Hip flexion    Hip extension    Hip abduction    Hip adduction    Hip internal rotation    Hip external rotation    Knee flexion 75   Knee extension -20   Ankle dorsiflexion    Ankle plantarflexion    Ankle inversion    Ankle eversion     (Blank rows = not tested)  LOWER EXTREMITY MMT:  MMT Right eval Left eval  Hip flexion 3+   Hip extension    Hip abduction    Hip adduction    Hip internal rotation    Hip external rotation    Knee flexion 3+    Knee extension 4- w/pain   Ankle dorsiflexion 5   Ankle plantarflexion 5   Ankle inversion    Ankle eversion     (Blank rows = not tested)   FUNCTIONAL TESTS:  5 times sit to stand: 48.13s  Timed up and go (TUG): 29.87s  GAIT: Distance walked: in clinic distances Assistive device utilized: Environmental consultant - 2 wheeled Level of assistance: Modified independence Comments: slowed gait, antalgic pattern, decreased step and stance time on RLE   TODAY'S TREATMENT: 04/20/22 Nustep L5 x58mns  PROM flex/ext, pat mob  AAROM heel slides w/strap x10 5s holds SLR 2# 2x10 SAQ 2x10  Bridges 2x10 Ball squeezes supine 2x10, 3s holds  Step up 4" w/HHA  walking w/o AD 2 laps, SBA  Hip abd 2# 2x10   04/14/22 Nustep L2 x538ms  PROM flex/ext, patellar mobs  Quad set 2x5 LAQ 2# 2x10 HS curls greenTB 2x10  STS 2x10  Gait training w/o AD  Vaso med, 1028m    PATIENT EDUCATION:  Education details: POC and HEP Person educated: Patient and Child(ren) Education method: Explanation Education comprehension: verbalized understanding and returned demonstration   HOME EXERCISE PROGRAM: Calf raises in RW  Mini squats in RW  Seated LAQ  Supine SAQ   ASSESSMENT:  CLINICAL IMPRESSION: Patient is making progress with functional tasks, able to walk with cane and without it at home. She is able to get up to 103 with AAROM heel slides for knee flexion. We worked on most activities laying supine to  strengthening hip muscles and glutes. Difficulty with step ups and puts a lot of pressure through arms. Does well walking w/o AD, still antalgic but with practice will progress.   ACTIVITY LIMITATIONS lifting, bending, sitting, standing, squatting, stairs, toileting, hygiene/grooming, and locomotion level  REHAB POTENTIAL: Good  CLINICAL DECISION MAKING: Stable/uncomplicated  EVALUATION COMPLEXITY: Low   GOALS: Goals reviewed with patient? Yes  SHORT TERM GOALS: Target date: 05/12/22  Patient will be independent with initial HEP. Goal status: INITIAL  2.  Patient will report a decrease in R knee pain to 2/10 or better.  Baseline: 5/10 Goal status: INITIAL   LONG TERM GOALS: Target date: 06/16/22  Patient will be independent with advanced/ongoing HEP to improve  outcomes and carryover.  Goal status: INITIAL  2.  Patient will demonstrate improved R knee AROM to >/= 5-120 deg to allow for normal gait, stair mechanics, and to be able to sit and stand from her toilet. Baseline: 20-0-75 Goal status: INITIAL  3.  Patient will demonstrate improved functional LE strength as demonstrated by 4/5 in all weak muscle groups. Goal status: INITIAL  4.  Patient will be able to ambulate 600' with normal gait pattern without increased pain to access community.  Baseline: walking w/RW Goal status: INITIAL  5. Patient will be able to ascend/descend stairs with 1 HR and reciprocal step pattern safely to access home and community.  Goal status: INITIAL  6.  Patient will demonstrate TUG score to <15s w/o AD to decrease fall risk and return to walking in her community  Baseline: 29.87s Goal status: INITIAL  7.  Patient will demonstrate 5xSTS score to < 20s to decrease fall risk and show functional strength improvement.  Baseline: 48.13s Goal status: INITIAL    PLAN: PT FREQUENCY: 2x/week  PT DURATION: 12 weeks  PLANNED INTERVENTIONS: Therapeutic exercises, Therapeutic activity,  Neuromuscular re-education, Balance training, Gait training, Patient/Family education, Self Care, Joint mobilization, Stair training, Electrical stimulation, Cryotherapy, Moist heat, Vasopneumatic device, Ionotophoresis '4mg'$ /ml Dexamethasone, and Manual therapy  PLAN FOR NEXT SESSION: PROM, gait training, step ups, LE strengthening    Andris Baumann, PT 04/20/2022, 5:00 PM

## 2022-04-22 ENCOUNTER — Encounter: Payer: Self-pay | Admitting: Physical Therapy

## 2022-04-22 ENCOUNTER — Ambulatory Visit: Payer: Medicare Other | Admitting: Physical Therapy

## 2022-04-22 DIAGNOSIS — M25561 Pain in right knee: Secondary | ICD-10-CM | POA: Diagnosis not present

## 2022-04-22 DIAGNOSIS — R2689 Other abnormalities of gait and mobility: Secondary | ICD-10-CM

## 2022-04-22 DIAGNOSIS — M6281 Muscle weakness (generalized): Secondary | ICD-10-CM

## 2022-04-22 DIAGNOSIS — M25661 Stiffness of right knee, not elsewhere classified: Secondary | ICD-10-CM

## 2022-04-22 NOTE — Therapy (Signed)
OUTPATIENT PHYSICAL THERAPY LOWER EXTREMITY TREATMENT   Patient Name: Gloria Mckinney MRN: [id number] DOB:1946-02-19, 76 y.o., female Today's Date: 04/22/2022   PT End of Session - 04/22/22 1612     Visit Number 4    Date for PT Re-Evaluation 06/16/22    Authorization Type Medicare    PT Start Time 1612    PT Stop Time 1700    PT Time Calculation (min) 48 min    Activity Tolerance Patient tolerated treatment well    Behavior During Therapy [facility] for tasks assessed/performed              Past Medical History:  Diagnosis Date   Cancer ([place])    GERD (gastroesophageal reflux disease)    History of radiation therapy 01/16/19- 02/13/19   left breast 15 fractions of 2.67 Gy for a total of 40.05 Gy. Left breast boost 5 fractions of 2 Gy for a total of 10 Gy   Hypertension    Mixed hyperlipidemia    OA (osteoarthritis)    knee right   Peripheral neuropathy    PVD (peripheral vascular disease) ([place])    Type 2 diabetes mellitus treated with insulin [facility])    endocrinologist--- dr Providence Crosby Madani   Past Surgical History:  Procedure Laterality Date   BREAST BIOPSY Bilateral 2000   benign   BREAST LUMPECTOMY WITH RADIOACTIVE SEED AND SENTINEL LYMPH NODE BIOPSY Left 08/15/2018   Procedure: LEFT BREAST LUMPECTOMY WITH RADIOACTIVE SEED AND LEFT AXILLARY DEEP SENTINEL LYMPH NODE BIOPSY INJECT BLUE DYE LEFT BREAST;  Surgeon: Fanny Skates, MD;  Location: [place];  Service: General;  Laterality: Left;   PORT-A-CATH REMOVAL Right 08/09/2019   Procedure: REMOVAL PORT-A-CATH;  Surgeon: Fanny Skates, MD;  Location: [place];  Service: General;  Laterality: Right;   PORTACATH PLACEMENT Right 08/15/2018   Procedure: INSERTION PORT-A-CATH WITH ULTRASOUND;  Surgeon: Fanny Skates, MD;  Location: [place];  Service: General;  Laterality: Right;   SHOULDER ARTHROSCOPY Right 06-16-2002   dr graves   SHOULDER OPEN ROTATOR CUFF REPAIR Right  07-28-2000    dr graves   VAGINAL HYSTERECTOMY  1992   with [place]  2007   Patient Active Problem List   Diagnosis Date Noted   Port-A-Cath in place 09/19/2018   Malignant neoplasm of upper-inner quadrant of left breast in female, estrogen receptor positive ([place]) 07/27/2018   Dyspnea on exertion 10/21/2016   Type 2 diabetes mellitus without complication, without long-term current use of insulin ([place]) 10/21/2016   Dyslipidemia 10/21/2016    PCP: [name]  REFERRING PROVIDER: Dorna Leitz   REFERRING DIAG: R TKA   THERAPY DIAG:  Acute pain of right knee  Stiffness of right knee, not elsewhere classified  Muscle weakness (generalized)  Other abnormalities of gait and mobility  Rationale for Evaluation and Treatment Rehabilitation  ONSET DATE: 03/25/22  SUBJECTIVE:   SUBJECTIVE STATEMENT: Still hurting and stiff  PERTINENT HISTORY: DM, HTN, PVD, shoulder sx, breast cancer sx   PAIN:  Are you having pain? Yes: NPRS scale: 4/10 Pain location: R knee Pain description: burning, achy  Aggravating factors: standing up, sitting, walking for a long time  Relieving factors: oxycodone, celebrix, ice  PRECAUTIONS: Fall  WEIGHT BEARING RESTRICTIONS No  FALLS:  Has patient fallen in last 6 months? No  LIVING ENVIRONMENT: Lives with: lives with their family Lives in: House/apartment Stairs: Yes: Internal: 15 steps; on right going up Has following equipment at home:  Quad cane small base and Walker - 2 wheeled  OCCUPATION: retired  PLOF: Independent  PATIENT GOALS no pain, stand up and walk    OBJECTIVE:  COGNITION:  Overall cognitive status: Within functional limits for tasks assessed     SENSATION: WFL   MUSCLE LENGTH: Hamstrings: Right mod tightness  POSTURE: rounded shoulders, forward head, flexed trunk , and weight shift left  PALPATION: Warm, and tender around R knee   LOWER EXTREMITY ROM:  Active ROM Right eval Right 04/22/22   Hip flexion    Hip extension    Hip abduction    Hip adduction    Hip internal rotation    Hip external rotation    Knee flexion 75 100  Knee extension -20 10  Ankle dorsiflexion    Ankle plantarflexion    Ankle inversion    Ankle eversion     (Blank rows = not tested)  LOWER EXTREMITY MMT:  MMT Right eval Left eval  Hip flexion 3+   Hip extension    Hip abduction    Hip adduction    Hip internal rotation    Hip external rotation    Knee flexion 3+    Knee extension 4- w/pain   Ankle dorsiflexion 5   Ankle plantarflexion 5   Ankle inversion    Ankle eversion     (Blank rows = not tested)   FUNCTIONAL TESTS:  5 times sit to stand: 48.13s  Timed up and go (TUG): 29.87s  GAIT: Distance walked: in clinic distances Assistive device utilized: Environmental consultant - 2 wheeled Level of assistance: Modified independence Comments: slowed gait, antalgic pattern, decreased step and stance time on RLE   TODAY'S TREATMENT: 04/22/22 STM of the scar and tissue around the knee and patella PROM of the right knee flexion and extension Nustep Level 5 x 6 minutes Bike partial revolutions x 5 minutes HS curls 15# 2x10 Stairs with hand rails one at a time, tried up step over step but needed help Gait with HHA working on speed and trust LAQ focus on TKE 2# 2x10   04/20/22 Nustep L5 x34mns  PROM flex/ext, pat mob  AAROM heel slides w/strap x10 5s holds SLR 2# 2x10 SAQ 2x10  Bridges 2x10 Ball squeezes supine 2x10, 3s holds  Step up 4" w/HHA  walking w/o AD 2 laps, SBA  Hip abd 2# 2x10   04/14/22 Nustep L2 x596ms  PROM flex/ext, patellar mobs  Quad set 2x5 LAQ 2# 2x10 HS curls greenTB 2x10  STS 2x10  Gait training w/o AD  Vaso med, 1054m    PATIENT EDUCATION:  Education details: POC and HEP Person educated: Patient and Child(ren) Education method: Explanation Education comprehension: verbalized understanding and returned demonstration   HOME EXERCISE PROGRAM: Calf  raises in RW  Mini squats in RW  Seated LAQ  Supine SAQ   ASSESSMENT:  CLINICAL IMPRESSION: Patient with good improvement with her ROM, I feel like she has some fear that holds her back, I was able to get her to full revolutions on the bike after about 3 minutes of partial revolutions, with slight trunk lean.  I did some practice with walking with just HHA and trying to get her to go a little faster and she did well, the first few steps are very timid  ACTIVITY LIMITATIONS lifting, bending, sitting, standing, squatting, stairs, toileting, hygiene/grooming, and locomotion level  REHAB POTENTIAL: Good  CLINICAL DECISION MAKING: Stable/uncomplicated  EVALUATION COMPLEXITY: Low   GOALS: Goals reviewed with patient?  Yes  SHORT TERM GOALS: Target date: 05/12/22  Patient will be independent with initial HEP. Goal status:met  2.  Patient will report a decrease in R knee pain to 2/10 or better.  Baseline: 5/10 Goal status: INITIAL   LONG TERM GOALS: Target date: 06/16/22  Patient will be independent with advanced/ongoing HEP to improve outcomes and carryover.  Goal status: INITIAL  2.  Patient will demonstrate improved R knee AROM to >/= 5-120 deg to allow for normal gait, stair mechanics, and to be able to sit and stand from her toilet. Baseline: 20-0-75 Goal status: INITIAL  3.  Patient will demonstrate improved functional LE strength as demonstrated by 4/5 in all weak muscle groups. Goal status: INITIAL  4.  Patient will be able to ambulate 600' with normal gait pattern without increased pain to access community.  Baseline: walking w/RW Goal status: INITIAL  5. Patient will be able to ascend/descend stairs with 1 HR and reciprocal step pattern safely to access home and community.  Goal status: INITIAL  6.  Patient will demonstrate TUG score to <15s w/o AD to decrease fall risk and return to walking in her community  Baseline: 29.87s Goal status: INITIAL  7.  Patient  will demonstrate 5xSTS score to < 20s to decrease fall risk and show functional strength improvement.  Baseline: 48.13s Goal status: INITIAL    PLAN: PT FREQUENCY: 2x/week  PT DURATION: 12 weeks  PLANNED INTERVENTIONS: Therapeutic exercises, Therapeutic activity, Neuromuscular re-education, Balance training, Gait training, Patient/Family education, Self Care, Joint mobilization, Stair training, Electrical stimulation, Cryotherapy, Moist heat, Vasopneumatic device, Ionotophoresis 31m/ml Dexamethasone, and Manual therapy  PLAN FOR NEXT SESSION: PROM, gait training, step ups, LE strengthening    Derril Franek W, PT 04/22/2022, 4:13 PM

## 2022-04-22 NOTE — Therapy (Deleted)
[place]. [place], [place], [address] Phone: [phone]   Fax:  [phone]  Physical Therapy Treatment  Patient Details  Name: Gloria Mckinney MRN: [id number] Date of Birth: 09-Nov-1945 No data recorded  Encounter Date: 04/22/2022   PT End of Session - 04/22/22 1612     Visit Number 4    Date for PT Re-Evaluation 06/16/22    Authorization Type Medicare    PT Start Time 1612    PT Stop Time 1700    PT Time Calculation (min) 48 min    Activity Tolerance Patient tolerated treatment well    Behavior During Therapy [facility] for tasks assessed/performed             Past Medical History:  Diagnosis Date   Cancer ([place])    GERD (gastroesophageal reflux disease)    History of radiation therapy 01/16/19- 02/13/19   left breast 15 fractions of 2.67 Gy for a total of 40.05 Gy. Left breast boost 5 fractions of 2 Gy for a total of 10 Gy   Hypertension    Mixed hyperlipidemia    OA (osteoarthritis)    knee right   Peripheral neuropathy    PVD (peripheral vascular disease) ([place])    Type 2 diabetes mellitus treated with insulin [facility])    endocrinologist--- dr Providence Crosby [place]    Past Surgical History:  Procedure Laterality Date   BREAST BIOPSY Bilateral 2000   benign   BREAST LUMPECTOMY WITH RADIOACTIVE SEED AND SENTINEL LYMPH NODE BIOPSY Left 08/15/2018   Procedure: LEFT BREAST LUMPECTOMY WITH RADIOACTIVE SEED AND LEFT AXILLARY DEEP SENTINEL LYMPH NODE BIOPSY INJECT BLUE DYE LEFT BREAST;  Surgeon: Fanny Skates, MD;  Location: [place];  Service: General;  Laterality: Left;   PORT-A-CATH REMOVAL Right 08/09/2019   Procedure: REMOVAL PORT-A-CATH;  Surgeon: Fanny Skates, MD;  Location: [place];  Service: General;  Laterality: Right;   PORTACATH PLACEMENT Right 08/15/2018   Procedure: INSERTION PORT-A-CATH WITH ULTRASOUND;  Surgeon: Fanny Skates, MD;  Location: [place];  Service: General;  Laterality: Right;   SHOULDER ARTHROSCOPY Right 06-16-2002   dr graves   SHOULDER OPEN ROTATOR CUFF REPAIR Right 07-28-2000    dr graves   VAGINAL HYSTERECTOMY  1992   with [place]  2007    There were no vitals filed for this visit.                                 PT Short Term Goals - 02/07/19 1655       PT SHORT TERM GOAL #1   Title independent with HEP    Status Achieved               PT Long Term Goals - 06/07/19 1742       PT LONG TERM GOAL #1   Title understand posture and body mechanics    Status Achieved      PT LONG TERM GOAL #2   Title increase lumbar ROM 25%    Status Achieved      PT LONG TERM GOAL #3   Title report sleeping better by 50%    Status Achieved      PT LONG TERM GOAL #4   Title cook a meal without pain    Status Partially Met      PT LONG TERM GOAL #  5   Title walk 1 mile    Status Achieved                    Patient will benefit from skilled therapeutic intervention in order to improve the following deficits and impairments:     Visit Diagnosis: Acute pain of right knee  Stiffness of right knee, not elsewhere classified  Muscle weakness (generalized)  Other abnormalities of gait and mobility     Problem List Patient Active Problem List   Diagnosis Date Noted   Port-A-Cath in place 09/19/2018   Malignant neoplasm of upper-inner quadrant of left breast in female, estrogen receptor positive (HCC) 07/27/2018   Dyspnea on exertion 10/21/2016   Type 2 diabetes mellitus without complication, without long-term current use of insulin (HCC) 10/21/2016   Dyslipidemia 10/21/2016    ALBRIGHT,MICHAEL W, PT 04/22/2022, 4:12 PM  [facility] [address] [place], [place], [address] Phone: [phone]   Fax:  [phone]  Name: Gloria Mckinney MRN: [id number] Date of Birth:  07/27/1946    

## 2022-04-27 ENCOUNTER — Ambulatory Visit: Payer: Medicare Other | Admitting: Physical Therapy

## 2022-04-27 ENCOUNTER — Encounter: Payer: Self-pay | Admitting: Physical Therapy

## 2022-04-27 DIAGNOSIS — M25561 Pain in right knee: Secondary | ICD-10-CM

## 2022-04-27 DIAGNOSIS — M6281 Muscle weakness (generalized): Secondary | ICD-10-CM

## 2022-04-27 DIAGNOSIS — R2689 Other abnormalities of gait and mobility: Secondary | ICD-10-CM

## 2022-04-27 DIAGNOSIS — R600 Localized edema: Secondary | ICD-10-CM

## 2022-04-27 DIAGNOSIS — M25661 Stiffness of right knee, not elsewhere classified: Secondary | ICD-10-CM

## 2022-04-27 NOTE — Therapy (Signed)
OUTPATIENT PHYSICAL THERAPY LOWER EXTREMITY TREATMENT   Patient Name: Gloria Mckinney MRN: [id number] DOB:1946/05/11, 76 y.o., female Today's Date: 04/27/2022   PT End of Session - 04/27/22 1654     Visit Number 5    Date for PT Re-Evaluation 06/16/22    Authorization Type Medicare    PT Start Time 1537    PT Stop Time 1620    PT Time Calculation (min) 43 min    Activity Tolerance Patient tolerated treatment well    Behavior During Therapy [facility] for tasks assessed/performed               Past Medical History:  Diagnosis Date   Cancer ([place])    GERD (gastroesophageal reflux disease)    History of radiation therapy 01/16/19- 02/13/19   left breast 15 fractions of 2.67 Gy for a total of 40.05 Gy. Left breast boost 5 fractions of 2 Gy for a total of 10 Gy   Hypertension    Mixed hyperlipidemia    OA (osteoarthritis)    knee right   Peripheral neuropathy    PVD (peripheral vascular disease) ([place])    Type 2 diabetes mellitus treated with insulin [facility])    endocrinologist--- dr Providence Crosby [place]   Past Surgical History:  Procedure Laterality Date   BREAST BIOPSY Bilateral 2000   benign   BREAST LUMPECTOMY WITH RADIOACTIVE SEED AND SENTINEL LYMPH NODE BIOPSY Left 08/15/2018   Procedure: LEFT BREAST LUMPECTOMY WITH RADIOACTIVE SEED AND LEFT AXILLARY DEEP SENTINEL LYMPH NODE BIOPSY INJECT BLUE DYE LEFT BREAST;  Surgeon: Fanny Skates, MD;  Location: [place];  Service: General;  Laterality: Left;   PORT-A-CATH REMOVAL Right 08/09/2019   Procedure: REMOVAL PORT-A-CATH;  Surgeon: Fanny Skates, MD;  Location: [place];  Service: General;  Laterality: Right;   PORTACATH PLACEMENT Right 08/15/2018   Procedure: INSERTION PORT-A-CATH WITH ULTRASOUND;  Surgeon: Fanny Skates, MD;  Location: [place];  Service: General;  Laterality: Right;   SHOULDER ARTHROSCOPY Right 06-16-2002   dr graves   SHOULDER OPEN ROTATOR CUFF REPAIR Right  07-28-2000    dr graves   VAGINAL HYSTERECTOMY  1992   with [place]  2007   Patient Active Problem List   Diagnosis Date Noted   Port-A-Cath in place 09/19/2018   Malignant neoplasm of upper-inner quadrant of left breast in female, estrogen receptor positive ([place]) 07/27/2018   Dyspnea on exertion 10/21/2016   Type 2 diabetes mellitus without complication, without long-term current use of insulin ([place]) 10/21/2016   Dyslipidemia 10/21/2016    PCP: [name]  REFERRING PROVIDER: Dorna Leitz   REFERRING DIAG: R TKA   THERAPY DIAG:  Acute pain of right knee  Stiffness of right knee, not elsewhere classified  Muscle weakness (generalized)  Other abnormalities of gait and mobility  Localized edema  Rationale for Evaluation and Treatment Rehabilitation  ONSET DATE: 03/25/22  SUBJECTIVE:   SUBJECTIVE STATEMENT: Patient reports pain of 3-4 in knee. Feel sshe is improving.  PERTINENT HISTORY: DM, HTN, PVD, shoulder sx, breast cancer sx   PAIN:  Are you having pain? Yes: NPRS scale: 4/10 Pain location: R knee Pain description: burning, achy  Aggravating factors: standing up, sitting, walking for a long time  Relieving factors: oxycodone, celebrix, ice  PRECAUTIONS: Fall  WEIGHT BEARING RESTRICTIONS No  FALLS:  Has patient fallen in last 6 months? No  LIVING ENVIRONMENT: Lives with: lives with their family Lives in: House/apartment Stairs: Yes: Internal:  15 steps; on right going up Has following equipment at home: Gaffer - 2 wheeled  OCCUPATION: retired  PLOF: Independent  PATIENT GOALS no pain, stand up and walk    OBJECTIVE:  COGNITION:  Overall cognitive status: Within functional limits for tasks assessed     SENSATION: WFL   MUSCLE LENGTH: Hamstrings: Right mod tightness  POSTURE: rounded shoulders, forward head, flexed trunk , and weight shift left  PALPATION: Warm, and tender around R knee   LOWER  EXTREMITY ROM:  Active ROM Right eval Right 04/22/22  Hip flexion    Hip extension    Hip abduction    Hip adduction    Hip internal rotation    Hip external rotation    Knee flexion 75 100  Knee extension -20 10  Ankle dorsiflexion    Ankle plantarflexion    Ankle inversion    Ankle eversion     (Blank rows = not tested)  LOWER EXTREMITY MMT:  MMT Right eval Left eval  Hip flexion 3+   Hip extension    Hip abduction    Hip adduction    Hip internal rotation    Hip external rotation    Knee flexion 3+    Knee extension 4- w/pain   Ankle dorsiflexion 5   Ankle plantarflexion 5   Ankle inversion    Ankle eversion     (Blank rows = not tested)   FUNCTIONAL TESTS:  5 times sit to stand: 48.13s  Timed up and go (TUG): 29.87s  GAIT: Distance walked: in clinic distances Assistive device utilized: Environmental consultant - 2 wheeled Level of assistance: Modified independence Comments: slowed gait, antalgic pattern, decreased step and stance time on RLE   TODAY'S TREATMENT: 04/27/22 NuStep L5 x 6 minutes Supine knee press with heel elevated on bolster, 5 x 5 sec hold with light overpressure. Knee nearly flat on mat after stretch. Heel slides x 10 reps SAQ with 2# weight, 10 reps SLR 10 reps, repeat with 2#, 10 reps. Seated LAQ 10 reps with 2#. Standing TKE against Blue Tband x 10 AAROM for knee flexion-4 x 5 sec hold 120 degrees Alternating step taps on 4" step with U HHA 10 reps each Step ups, R foot on bottom step, L foot step to 2nd step, UUE suppor ton handrail. She had more difficulty with this one, used arm to pull to A. Ambulated with emphasis on allowing R knee to flex naturally at HO, 1 x 200' Vaso at 34 degrees, low pressure to R knee x 10 minutes.  04/22/22 STM of the scar and tissue around the knee and patella PROM of the right knee flexion and extension Nustep Level 5 x 6 minutes Bike partial revolutions x 5 minutes HS curls 15# 2x10 Stairs with hand rails one  at a time, tried up step over step but needed help Gait with HHA working on speed and trust LAQ focus on TKE 2# 2x10    04/20/22 Nustep L5 x17mins  PROM flex/ext, pat mob  AAROM heel slides w/strap x10 5s holds SLR 2# 2x10 SAQ 2x10  Bridges 2x10 Ball squeezes supine 2x10, 3s holds  Step up 4" w/HHA  walking w/o AD 2 laps, SBA  Hip abd 2# 2x10   04/14/22 Nustep L2 x105mins  PROM flex/ext, patellar mobs  Quad set 2x5 LAQ 2# 2x10 HS curls greenTB 2x10  STS 2x10  Gait training w/o AD  Vaso med, 58mins    PATIENT EDUCATION:  Education details: POC and HEP Person educated: Patient and Child(ren) Education method: Explanation Education comprehension: verbalized understanding and returned demonstration   HOME EXERCISE PROGRAM: Calf raises in RW  Mini squats in RW  Seated LAQ  Supine SAQ   ASSESSMENT:  CLINICAL IMPRESSION: Patient with good improvement with her ROM, I feel like she continues to demonstrate good progress toward LTG with improved ROM and strength in RLE. She still needs instruction to increase RLE WB and normal use in functional mobility, but improved with VC today.  ACTIVITY LIMITATIONS lifting, bending, sitting, standing, squatting, stairs, toileting, hygiene/grooming, and locomotion level  REHAB POTENTIAL: Good  CLINICAL DECISION MAKING: Stable/uncomplicated  EVALUATION COMPLEXITY: Low   GOALS: Goals reviewed with patient? Yes  SHORT TERM GOALS: Target date: 05/12/22  Patient will be independent with initial HEP. Goal status:met  2.  Patient will report a decrease in R knee pain to 2/10 or better.  Baseline: 5/10 Goal status: ongoing   LONG TERM GOALS: Target date: 06/16/22  Patient will be independent with advanced/ongoing HEP to improve outcomes and carryover.  Goal status: INITIAL  2.  Patient will demonstrate improved R knee AROM to >/= 5-120 deg to allow for normal gait, stair mechanics, and to be able to sit and stand from her  toilet. Baseline: 20-0-75 Goal status: INITIAL  3.  Patient will demonstrate improved functional LE strength as demonstrated by 4/5 in all weak muscle groups. Goal status: ongoing  4.  Patient will be able to ambulate 600' with normal gait pattern without increased pain to access community.  Baseline: walking w/RW Goal status: INITIAL  5. Patient will be able to ascend/descend stairs with 1 HR and reciprocal step pattern safely to access home and community.  Goal status: INITIAL  6.  Patient will demonstrate TUG score to <15s w/o AD to decrease fall risk and return to walking in her community  Baseline: 29.87s Goal status: INITIAL  7.  Patient will demonstrate 5xSTS score to < 20s to decrease fall risk and show functional strength improvement.  Baseline: 48.13s Goal status: INITIAL    PLAN: PT FREQUENCY: 2x/week  PT DURATION: 12 weeks  PLANNED INTERVENTIONS: Therapeutic exercises, Therapeutic activity, Neuromuscular re-education, Balance training, Gait training, Patient/Family education, Self Care, Joint mobilization, Stair training, Electrical stimulation, Cryotherapy, Moist heat, Vasopneumatic device, Ionotophoresis 4mg /ml Dexamethasone, and Manual therapy  PLAN FOR NEXT SESSION: PROM, gait training, step ups, LE strengthening    Marcelina Morel, DPT 04/27/2022, 5:22 PM

## 2022-04-29 ENCOUNTER — Ambulatory Visit: Payer: Medicare Other | Admitting: Physical Therapy

## 2022-04-29 ENCOUNTER — Encounter: Payer: Self-pay | Admitting: Physical Therapy

## 2022-04-29 DIAGNOSIS — M25561 Pain in right knee: Secondary | ICD-10-CM

## 2022-04-29 DIAGNOSIS — R2689 Other abnormalities of gait and mobility: Secondary | ICD-10-CM

## 2022-04-29 DIAGNOSIS — M6281 Muscle weakness (generalized): Secondary | ICD-10-CM

## 2022-04-29 DIAGNOSIS — R600 Localized edema: Secondary | ICD-10-CM

## 2022-04-29 DIAGNOSIS — M25661 Stiffness of right knee, not elsewhere classified: Secondary | ICD-10-CM

## 2022-04-29 NOTE — Therapy (Signed)
OUTPATIENT PHYSICAL THERAPY LOWER EXTREMITY TREATMENT   Patient Name: Gloria Mckinney MRN: [id number] DOB:[birth year], 76 y.o., female Today's Date: 04/29/2022   PT End of Session - 04/29/22 1639     Visit Number 6    Date for PT Re-Evaluation 06/16/22    PT Start Time 1632    PT Stop Time 1710    PT Time Calculation (min) 38 min    Activity Tolerance Patient tolerated treatment well    Behavior During Therapy [facility] for tasks assessed/performed                Past Medical History:  Diagnosis Date   Cancer ([place])    GERD (gastroesophageal reflux disease)    History of radiation therapy 01/16/19- 02/13/19   left breast 15 fractions of 2.67 Gy for a total of 40.05 Gy. Left breast boost 5 fractions of 2 Gy for a total of 10 Gy   Hypertension    Mixed hyperlipidemia    OA (osteoarthritis)    knee right   Peripheral neuropathy    PVD (peripheral vascular disease) ([place])    Type 2 diabetes mellitus treated with insulin [facility])    endocrinologist--- dr Providence Crosby [place]   Past Surgical History:  Procedure Laterality Date   BREAST BIOPSY Bilateral 2000   benign   BREAST LUMPECTOMY WITH RADIOACTIVE SEED AND SENTINEL LYMPH NODE BIOPSY Left 08/15/2018   Procedure: LEFT BREAST LUMPECTOMY WITH RADIOACTIVE SEED AND LEFT AXILLARY DEEP SENTINEL LYMPH NODE BIOPSY INJECT BLUE DYE LEFT BREAST;  Surgeon: Fanny Skates, MD;  Location: [place];  Service: General;  Laterality: Left;   PORT-A-CATH REMOVAL Right 08/09/2019   Procedure: REMOVAL PORT-A-CATH;  Surgeon: Fanny Skates, MD;  Location: [place];  Service: General;  Laterality: Right;   PORTACATH PLACEMENT Right 08/15/2018   Procedure: INSERTION PORT-A-CATH WITH ULTRASOUND;  Surgeon: Fanny Skates, MD;  Location: [place];  Service: General;  Laterality: Right;   SHOULDER ARTHROSCOPY Right 06-16-2002   dr graves   SHOULDER OPEN ROTATOR CUFF REPAIR Right 07-28-2000    dr graves    VAGINAL HYSTERECTOMY  1992   with [place]  2007   Patient Active Problem List   Diagnosis Date Noted   Port-A-Cath in place 09/19/2018   Malignant neoplasm of upper-inner quadrant of left breast in female, estrogen receptor positive ([place]) 07/27/2018   Dyspnea on exertion 10/21/2016   Type 2 diabetes mellitus without complication, without long-term current use of insulin ([place]) 10/21/2016   Dyslipidemia 10/21/2016    PCP: [name]  REFERRING PROVIDER: Dorna Leitz   REFERRING DIAG: R TKA   THERAPY DIAG:  Acute pain of right knee  Stiffness of right knee, not elsewhere classified  Muscle weakness (generalized)  Other abnormalities of gait and mobility  Localized edema  Rationale for Evaluation and Treatment Rehabilitation  ONSET DATE: 03/25/22  SUBJECTIVE:   SUBJECTIVE STATEMENT: Patient reports increased pain when she is still, up to 4-5, but when she moves it improves.   PERTINENT HISTORY: DM, HTN, PVD, shoulder sx, breast cancer sx   PAIN:  Are you having pain? Yes: NPRS scale: 4/10 Pain location: R knee Pain description: burning, achy  Aggravating factors: standing up, sitting, walking for a long time  Relieving factors: oxycodone, celebrix, ice  PRECAUTIONS: Fall  WEIGHT BEARING RESTRICTIONS No  FALLS:  Has patient fallen in last 6 months? No  LIVING ENVIRONMENT: Lives with: lives with their family Lives in: House/apartment Stairs:  Yes: Internal: 15 steps; on right going up Has following equipment at home: Quad cane small base and Walker - 2 wheeled  OCCUPATION: retired  PLOF: Independent  PATIENT GOALS no pain, stand up and walk    OBJECTIVE:  COGNITION:  Overall cognitive status: Within functional limits for tasks assessed     SENSATION: WFL   MUSCLE LENGTH: Hamstrings: Right mod tightness  POSTURE: rounded shoulders, forward head, flexed trunk , and weight shift left  PALPATION: Warm, and tender around R knee    LOWER EXTREMITY ROM:  Active ROM Right eval Right 04/22/22  Hip flexion    Hip extension    Hip abduction    Hip adduction    Hip internal rotation    Hip external rotation    Knee flexion 75 100  Knee extension -20 10  Ankle dorsiflexion    Ankle plantarflexion    Ankle inversion    Ankle eversion     (Blank rows = not tested)  LOWER EXTREMITY MMT:  MMT Right eval Left eval  Hip flexion 3+   Hip extension    Hip abduction    Hip adduction    Hip internal rotation    Hip external rotation    Knee flexion 3+    Knee extension 4- w/pain   Ankle dorsiflexion 5   Ankle plantarflexion 5   Ankle inversion    Ankle eversion     (Blank rows = not tested)   FUNCTIONAL TESTS:  5 times sit to stand: 48.13s  Timed up and go (TUG): 29.87s  GAIT: Distance walked: in clinic distances Assistive device utilized: Environmental consultant - 2 wheeled Level of assistance: Modified independence Comments: slowed gait, antalgic pattern, decreased step and stance time on RLE   TODAY'S TREATMENT: 04/29/22 NuStep L5 x 6 minutes STM to R prepatellar quad, R prox calf, distal hamstring SAQ with 3# weight, 2 x 10 reps SLR, 2 x 10 with 2# resistance Bridge over physioball 2 x 10 reps Repeat with B hip IR 2 x 10 reps Seated AROM for L knee flexion Resisted gait with 20#, 3 reps forward, 3 reps backward Declined VASO   04/27/22 NuStep L5 x 6 minutes Supine knee press with heel elevated on bolster, 5 x 5 sec hold with light overpressure. Knee nearly flat on mat after stretch. Heel slides x 10 reps SAQ with 2# weight, 10 reps SLR 10 reps, repeat with 2#, 10 reps. Seated LAQ 10 reps with 2#. Standing TKE against Blue Tband x 10 AAROM for knee flexion-4 x 5 sec hold 120 degrees Alternating step taps on 4" step with U HHA 10 reps each Step ups, R foot on bottom step, L foot step to 2nd step, UUE suppor ton handrail. She had more difficulty with this one, used arm to pull to A. Ambulated with  emphasis on allowing R knee to flex naturally at HO, 1 x 200' Vaso at 34 degrees, low pressure to R knee x 10 minutes.  04/22/22 STM of the scar and tissue around the knee and patella PROM of the right knee flexion and extension Nustep Level 5 x 6 minutes Bike partial revolutions x 5 minutes HS curls 15# 2x10 Stairs with hand rails one at a time, tried up step over step but needed help Gait with HHA working on speed and trust LAQ focus on TKE 2# 2x10    04/20/22 Nustep L5 x51mns  PROM flex/ext, pat mob  AAROM heel slides w/strap x10 5s holds SLR  2# 2x10 SAQ 2x10  Bridges 2x10 Ball squeezes supine 2x10, 3s holds  Step up 4" w/HHA  walking w/o AD 2 laps, SBA  Hip abd 2# 2x10   04/14/22 Nustep L2 x72mns  PROM flex/ext, patellar mobs  Quad set 2x5 LAQ 2# 2x10 HS curls greenTB 2x10  STS 2x10  Gait training w/o AD  Vaso med, 169ms    PATIENT EDUCATION:  Education details: POC and HEP Person educated: Patient and Child(ren) Education method: Explanation Education comprehension: verbalized understanding and returned demonstration   HOME EXERCISE PROGRAM: Calf raises in RW  Mini squats in RW  Seated LAQ  Supine SAQ   ASSESSMENT:  CLINICAL IMPRESSION: Patient tolerated progression of strengthening, demonstrated improved knee extension ROM. Continues to walk with decreased WB through LLE and stiff knee.  ACTIVITY LIMITATIONS lifting, bending, sitting, standing, squatting, stairs, toileting, hygiene/grooming, and locomotion level  REHAB POTENTIAL: Good  CLINICAL DECISION MAKING: Stable/uncomplicated  EVALUATION COMPLEXITY: Low   GOALS: Goals reviewed with patient? Yes  SHORT TERM GOALS: Target date: 05/12/22  Patient will be independent with initial HEP. Goal status:met  2.  Patient will report a decrease in R knee pain to 2/10 or better.  Baseline: 5/10 Goal status: ongoing   LONG TERM GOALS: Target date: 06/16/22  Patient will be independent with  advanced/ongoing HEP to improve outcomes and carryover.  Goal status: ongoing 2.  Patient will demonstrate improved R knee AROM to >/= 5-120 deg to allow for normal gait, stair mechanics, and to be able to sit and stand from her toilet. Baseline: 20-0-75 Goal status: INITIAL  3.  Patient will demonstrate improved functional LE strength as demonstrated by 4/5 in all weak muscle groups. Goal status: ongoing  4.  Patient will be able to ambulate 600' with normal gait pattern without increased pain to access community.  Baseline: walking w/RW Goal status:ongoing  5. Patient will be able to ascend/descend stairs with 1 HR and reciprocal step pattern safely to access home and community.  Goal status: INITIAL  6.  Patient will demonstrate TUG score to <15s w/o AD to decrease fall risk and return to walking in her community  Baseline: 29.87s Goal status: INITIAL  7.  Patient will demonstrate 5xSTS score to < 20s to decrease fall risk and show functional strength improvement.  Baseline: 48.13s Goal status: INITIAL    PLAN: PT FREQUENCY: 2x/week  PT DURATION: 12 weeks  PLANNED INTERVENTIONS: Therapeutic exercises, Therapeutic activity, Neuromuscular re-education, Balance training, Gait training, Patient/Family education, Self Care, Joint mobilization, Stair training, Electrical stimulation, Cryotherapy, Moist heat, Vasopneumatic device, Ionotophoresis 29m59ml Dexamethasone, and Manual therapy  PLAN FOR NEXT SESSION: PROM, gait training,  LE strengthening    SusMarcelina MorelPT 04/29/2022, 5:29 PM

## 2022-05-04 ENCOUNTER — Encounter: Payer: Self-pay | Admitting: Physical Therapy

## 2022-05-04 ENCOUNTER — Ambulatory Visit: Payer: Medicare Other | Admitting: Physical Therapy

## 2022-05-04 DIAGNOSIS — R2689 Other abnormalities of gait and mobility: Secondary | ICD-10-CM

## 2022-05-04 DIAGNOSIS — M25561 Pain in right knee: Secondary | ICD-10-CM

## 2022-05-04 DIAGNOSIS — M25661 Stiffness of right knee, not elsewhere classified: Secondary | ICD-10-CM

## 2022-05-04 DIAGNOSIS — M6281 Muscle weakness (generalized): Secondary | ICD-10-CM

## 2022-05-04 DIAGNOSIS — R600 Localized edema: Secondary | ICD-10-CM

## 2022-05-04 NOTE — Therapy (Signed)
OUTPATIENT PHYSICAL THERAPY LOWER EXTREMITY TREATMENT   Patient Name: Gloria Mckinney MRN: [id number] DOB:1945-11-23, 76 y.o., female Today's Date: 05/04/2022   PT End of Session - 05/04/22 1702     Visit Number 7    Date for PT Re-Evaluation 06/16/22    Authorization Type Medicare    PT Start Time 1659    PT Stop Time 1745    PT Time Calculation (min) 46 min    Activity Tolerance Patient tolerated treatment well    Behavior During Therapy [facility] for tasks assessed/performed                Past Medical History:  Diagnosis Date   Cancer ([place])    GERD (gastroesophageal reflux disease)    History of radiation therapy 01/16/19- 02/13/19   left breast 15 fractions of 2.67 Gy for a total of 40.05 Gy. Left breast boost 5 fractions of 2 Gy for a total of 10 Gy   Hypertension    Mixed hyperlipidemia    OA (osteoarthritis)    knee right   Peripheral neuropathy    PVD (peripheral vascular disease) ([place])    Type 2 diabetes mellitus treated with insulin [facility])    endocrinologist--- dr Providence Crosby Mcminn   Past Surgical History:  Procedure Laterality Date   BREAST BIOPSY Bilateral 2000   benign   BREAST LUMPECTOMY WITH RADIOACTIVE SEED AND SENTINEL LYMPH NODE BIOPSY Left 08/15/2018   Procedure: LEFT BREAST LUMPECTOMY WITH RADIOACTIVE SEED AND LEFT AXILLARY DEEP SENTINEL LYMPH NODE BIOPSY INJECT BLUE DYE LEFT BREAST;  Surgeon: Fanny Skates, MD;  Location: [place];  Service: General;  Laterality: Left;   PORT-A-CATH REMOVAL Right 08/09/2019   Procedure: REMOVAL PORT-A-CATH;  Surgeon: Fanny Skates, MD;  Location: [place];  Service: General;  Laterality: Right;   PORTACATH PLACEMENT Right 08/15/2018   Procedure: INSERTION PORT-A-CATH WITH ULTRASOUND;  Surgeon: Fanny Skates, MD;  Location: [place];  Service: General;  Laterality: Right;   SHOULDER ARTHROSCOPY Right 06-16-2002   dr graves   SHOULDER OPEN ROTATOR CUFF REPAIR  Right 07-28-2000    dr graves   VAGINAL HYSTERECTOMY  1992   with [place]  2007   Patient Active Problem List   Diagnosis Date Noted   Port-A-Cath in place 09/19/2018   Malignant neoplasm of upper-inner quadrant of left breast in female, estrogen receptor positive ([place]) 07/27/2018   Dyspnea on exertion 10/21/2016   Type 2 diabetes mellitus without complication, without long-term current use of insulin ([place]) 10/21/2016   Dyslipidemia 10/21/2016    PCP: [name]  REFERRING PROVIDER: Dorna Leitz   REFERRING DIAG: R TKA   THERAPY DIAG:  Acute pain of right knee  Stiffness of right knee, not elsewhere classified  Muscle weakness (generalized)  Other abnormalities of gait and mobility  Localized edema  Rationale for Evaluation and Treatment Rehabilitation  ONSET DATE: 03/25/22  SUBJECTIVE:   SUBJECTIVE STATEMENT: Patient reports medial right knee pain a 4-5/10 with being still, movements are better  PERTINENT HISTORY: DM, HTN, PVD, shoulder sx, breast cancer sx   PAIN:  Are you having pain? Yes: NPRS scale: 4/10 Pain location: R knee Pain description: burning, achy  Aggravating factors: standing up, sitting, walking for a long time  Relieving factors: oxycodone, celebrix, ice  PRECAUTIONS: Fall  WEIGHT BEARING RESTRICTIONS No  FALLS:  Has patient fallen in last 6 months? No  LIVING ENVIRONMENT: Lives with: lives with their family Lives in:  House/apartment Stairs: Yes: Internal: 15 steps; on right going up Has following equipment at home: Quad cane small base and Walker - 2 wheeled  OCCUPATION: retired  PLOF: [place] no pain, stand up and walk    OBJECTIVE:  COGNITION:  Overall cognitive status: Within functional limits for tasks assessed     SENSATION: WFL   MUSCLE LENGTH: Hamstrings: Right mod tightness  POSTURE: rounded shoulders, forward head, flexed trunk , and weight shift left  PALPATION: Warm, and  tender around R knee   LOWER EXTREMITY ROM:  Active ROM Right eval Right 04/22/22 Right  05/04/22  Hip flexion     Hip extension     Hip abduction     Hip adduction     Hip internal rotation     Hip external rotation     Knee flexion 75 100 100  Knee extension -'20 10 12  ' Ankle dorsiflexion     Ankle plantarflexion     Ankle inversion     Ankle eversion      (Blank rows = not tested)  LOWER EXTREMITY MMT:  MMT Right eval Left eval  Hip flexion 3+   Hip extension    Hip abduction    Hip adduction    Hip internal rotation    Hip external rotation    Knee flexion 3+    Knee extension 4- w/pain   Ankle dorsiflexion 5   Ankle plantarflexion 5   Ankle inversion    Ankle eversion     (Blank rows = not tested)   FUNCTIONAL TESTS:  5 times sit to stand: 48.13s  Timed up and go (TUG): 29.87s  GAIT: Distance walked: in clinic distances Assistive device utilized: Environmental consultant - 2 wheeled Level of assistance: Modified independence Comments: slowed gait, antalgic pattern, decreased step and stance time on RLE   TODAY'S TREATMENT: 05/04/22 Nustep level 5 x 6 minutes Walking without device but with HHA working on speed and pattern PROM focus on flexion, STM to the peripatellar area and the medial knee Bike partial revs 6" toe clears Kleenex box step overs Red tband HS curls Stairs 4" step over stp going up 2# LAQ 3x10 Leg press 20# 2x5 both legs Right leg only now weight 2x5 and then both legs no weight working on flexion   04/29/22 NuStep L5 x 6 minutes STM to R prepatellar quad, R prox calf, distal hamstring SAQ with 3# weight, 2 x 10 reps SLR, 2 x 10 with 2# resistance Bridge over physioball 2 x 10 reps Repeat with B hip IR 2 x 10 reps Seated AROM for L knee flexion Resisted gait with 20#, 3 reps forward, 3 reps backward Declined VASO   04/27/22 NuStep L5 x 6 minutes Supine knee press with heel elevated on bolster, 5 x 5 sec hold with light overpressure. Knee  nearly flat on mat after stretch. Heel slides x 10 reps SAQ with 2# weight, 10 reps SLR 10 reps, repeat with 2#, 10 reps. Seated LAQ 10 reps with 2#. Standing TKE against Blue Tband x 10 AAROM for knee flexion-4 x 5 sec hold 120 degrees Alternating step taps on 4" step with U HHA 10 reps each Step ups, R foot on bottom step, L foot step to 2nd step, UUE suppor ton handrail. She had more difficulty with this one, used arm to pull to A. Ambulated with emphasis on allowing R knee to flex naturally at HO, 1 x 200' Vaso at 34 degrees, low  pressure to R knee x 10 minutes.  04/22/22 STM of the scar and tissue around the knee and patella PROM of the right knee flexion and extension Nustep Level 5 x 6 minutes Bike partial revolutions x 5 minutes HS curls 15# 2x10 Stairs with hand rails one at a time, tried up step over step but needed help Gait with HHA working on speed and trust LAQ focus on TKE 2# 2x10    04/20/22 Nustep L5 x78mns  PROM flex/ext, pat mob  AAROM heel slides w/strap x10 5s holds SLR 2# 2x10 SAQ 2x10  Bridges 2x10 Ball squeezes supine 2x10, 3s holds  Step up 4" w/HHA  walking w/o AD 2 laps, SBA  Hip abd 2# 2x10   04/14/22 Nustep L2 x564ms  PROM flex/ext, patellar mobs  Quad set 2x5 LAQ 2# 2x10 HS curls greenTB 2x10  STS 2x10  Gait training w/o AD  Vaso med, 1079m    PATIENT EDUCATION:  Education details: POC and HEP Person educated: Patient and Child(ren) Education method: Explanation Education comprehension: verbalized understanding and returned demonstration   HOME EXERCISE PROGRAM: Calf raises in RW  Mini squats in RW  Seated LAQ  Supine SAQ   ASSESSMENT:  CLINICAL IMPRESSION: Patient comes in with a very stiff knee, I first measured her worse than the last time but after some PROM we got back to 100 degrees flexion, she is not walking well, she tends to leave the leg stiff and needs a lot of encouragement, tends to get stiff and sore    ACTIVITY LIMITATIONS lifting, bending, sitting, standing, squatting, stairs, toileting, hygiene/grooming, and locomotion level  REHAB POTENTIAL: Good  CLINICAL DECISION MAKING: Stable/uncomplicated  EVALUATION COMPLEXITY: Low   GOALS: Goals reviewed with patient? Yes  SHORT TERM GOALS: Target date: 05/12/22  Patient will be independent with initial HEP. Goal status:met  2.  Patient will report a decrease in R knee pain to 2/10 or better.  Baseline: 5/10 Goal status: ongoing   LONG TERM GOALS: Target date: 06/16/22  Patient will be independent with advanced/ongoing HEP to improve outcomes and carryover.  Goal status: ongoing 2.  Patient will demonstrate improved R knee AROM to >/= 5-120 deg to allow for normal gait, stair mechanics, and to be able to sit and stand from her toilet. Baseline: 20-0-75 Goal status: INITIAL  3.  Patient will demonstrate improved functional LE strength as demonstrated by 4/5 in all weak muscle groups. Goal status: ongoing  4.  Patient will be able to ambulate 600' with normal gait pattern without increased pain to access community.  Baseline: walking w/RW Goal status:ongoing  5. Patient will be able to ascend/descend stairs with 1 HR and reciprocal step pattern safely to access home and community.  Goal status: INITIAL  6.  Patient will demonstrate TUG score to <15s w/o AD to decrease fall risk and return to walking in her community  Baseline: 29.87s Goal status: INITIAL  7.  Patient will demonstrate 5xSTS score to < 20s to decrease fall risk and show functional strength improvement.  Baseline: 48.13s Goal status: INITIAL    PLAN: PT FREQUENCY: 2x/week  PT DURATION: 12 weeks  PLANNED INTERVENTIONS: Therapeutic exercises, Therapeutic activity, Neuromuscular re-education, Balance training, Gait training, Patient/Family education, Self Care, Joint mobilization, Stair training, Electrical stimulation, Cryotherapy, Moist heat,  Vasopneumatic device, Ionotophoresis 4mg6m Dexamethasone, and Manual therapy  PLAN FOR NEXT SESSION: continue to work on a natural bend and better gait   MichLum Babe 05/04/2022, 5:02 PM

## 2022-05-06 ENCOUNTER — Encounter: Payer: Self-pay | Admitting: Physical Therapy

## 2022-05-06 ENCOUNTER — Ambulatory Visit: Payer: Medicare Other | Admitting: Physical Therapy

## 2022-05-06 DIAGNOSIS — R2689 Other abnormalities of gait and mobility: Secondary | ICD-10-CM

## 2022-05-06 DIAGNOSIS — M25561 Pain in right knee: Secondary | ICD-10-CM

## 2022-05-06 DIAGNOSIS — M25661 Stiffness of right knee, not elsewhere classified: Secondary | ICD-10-CM

## 2022-05-06 DIAGNOSIS — R600 Localized edema: Secondary | ICD-10-CM

## 2022-05-06 DIAGNOSIS — M6281 Muscle weakness (generalized): Secondary | ICD-10-CM

## 2022-05-06 NOTE — Therapy (Signed)
OUTPATIENT PHYSICAL THERAPY LOWER EXTREMITY TREATMENT   Patient Name: Gloria Mckinney MRN: [id number] DOB:[birth year], 76 y.o., female Today's Date: 05/06/2022   PT End of Session - 05/06/22 1633     Visit Number 8    Date for PT Re-Evaluation 06/16/22    Authorization Type Medicare    PT Start Time 1615    PT Stop Time 1700    PT Time Calculation (min) 45 min    Activity Tolerance Patient tolerated treatment well    Behavior During Therapy [facility] for tasks assessed/performed                Past Medical History:  Diagnosis Date   Cancer ([place])    GERD (gastroesophageal reflux disease)    History of radiation therapy 01/16/19- 02/13/19   left breast 15 fractions of 2.67 Gy for a total of 40.05 Gy. Left breast boost 5 fractions of 2 Gy for a total of 10 Gy   Hypertension    Mixed hyperlipidemia    OA (osteoarthritis)    knee right   Peripheral neuropathy    PVD (peripheral vascular disease) ([place])    Type 2 diabetes mellitus treated with insulin [facility])    endocrinologist--- dr Providence Crosby Werntz   Past Surgical History:  Procedure Laterality Date   BREAST BIOPSY Bilateral 2000   benign   BREAST LUMPECTOMY WITH RADIOACTIVE SEED AND SENTINEL LYMPH NODE BIOPSY Left 08/15/2018   Procedure: LEFT BREAST LUMPECTOMY WITH RADIOACTIVE SEED AND LEFT AXILLARY DEEP SENTINEL LYMPH NODE BIOPSY INJECT BLUE DYE LEFT BREAST;  Surgeon: Fanny Skates, MD;  Location: [place];  Service: General;  Laterality: Left;   PORT-A-CATH REMOVAL Right 08/09/2019   Procedure: REMOVAL PORT-A-CATH;  Surgeon: Fanny Skates, MD;  Location: [place];  Service: General;  Laterality: Right;   PORTACATH PLACEMENT Right 08/15/2018   Procedure: INSERTION PORT-A-CATH WITH ULTRASOUND;  Surgeon: Fanny Skates, MD;  Location: [place];  Service: General;  Laterality: Right;   SHOULDER ARTHROSCOPY Right 06-16-2002   dr graves   SHOULDER OPEN ROTATOR CUFF REPAIR  Right 07-28-2000    dr graves   VAGINAL HYSTERECTOMY  1992   with [place]  2007   Patient Active Problem List   Diagnosis Date Noted   Port-A-Cath in place 09/19/2018   Malignant neoplasm of upper-inner quadrant of left breast in female, estrogen receptor positive ([place]) 07/27/2018   Dyspnea on exertion 10/21/2016   Type 2 diabetes mellitus without complication, without long-term current use of insulin ([place]) 10/21/2016   Dyslipidemia 10/21/2016    PCP: [name]  REFERRING PROVIDER: Dorna Leitz   REFERRING DIAG: R TKA   THERAPY DIAG:  Acute pain of right knee  Stiffness of right knee, not elsewhere classified  Muscle weakness (generalized)  Other abnormalities of gait and mobility  Localized edema  Rationale for Evaluation and Treatment Rehabilitation  ONSET DATE: 03/25/22  SUBJECTIVE:   SUBJECTIVE STATEMENT: Conitnues to have c/o medial knee pain, she has pain with bending and walking and straightening PERTINENT HISTORY: DM, HTN, PVD, shoulder sx, breast cancer sx   PAIN:  Are you having pain? Yes: NPRS scale: 4/10 Pain location: R knee Pain description: burning, achy  Aggravating factors: standing up, sitting, walking for a long time  Relieving factors: oxycodone, celebrix, ice  PRECAUTIONS: Fall  WEIGHT BEARING RESTRICTIONS No  FALLS:  Has patient fallen in last 6 months? No  LIVING ENVIRONMENT: Lives with: lives with their family Lives  in: House/apartment Stairs: Yes: Internal: 15 steps; on right going up Has following equipment at home: Gaffer - 2 wheeled  OCCUPATION: retired  PLOF: Independent  PATIENT GOALS no pain, stand up and walk    OBJECTIVE:  COGNITION:  Overall cognitive status: Within functional limits for tasks assessed     SENSATION: WFL   MUSCLE LENGTH: Hamstrings: Right mod tightness  POSTURE: rounded shoulders, forward head, flexed trunk , and weight shift  left  PALPATION: Warm, and tender around R knee   LOWER EXTREMITY ROM:  Active ROM Right eval Right 04/22/22 Right  05/04/22  Hip flexion     Hip extension     Hip abduction     Hip adduction     Hip internal rotation     Hip external rotation     Knee flexion 75 100 100  Knee extension -_0 Ankle dorsiflexion     Ankle plantarflexion     Ankle inversion     Ankle eversion      (Blank rows = not tested)  LOWER EXTREMITY MMT:  MMT Right eval Left eval  Hip flexion 3+   Hip extension    Hip abduction    Hip adduction    Hip internal rotation    Hip external rotation    Knee flexion 3+    Knee extension 4- w/pain   Ankle dorsiflexion 5   Ankle plantarflexion 5   Ankle inversion    Ankle eversion     (Blank rows = not tested)   FUNCTIONAL TESTS:  5 times sit to stand: 48.13s  Timed up and go (TUG): 29.87s  GAIT: Distance walked: in clinic distances Assistive device utilized: Environmental consultant - 2 wheeled Level of assistance: Modified independence Comments: slowed gait, antalgic pattern, decreased step and stance time on RLE   TODAY'S TREATMENT: 05/06/22 Nustep level 4 x 6 minutes Bike partial revs x 5 minutes Gait outside back door to front door.with SPC and CGA Red tband HS curls 6" toe clears 4" steps up and down with two handrails   05/04/22 Nustep level 5 x 6 minutes Walking without device but with HHA working on speed and pattern PROM focus on flexion, STM to the peripatellar area and the medial knee Bike partial revs 6" toe clears Kleenex box step overs Red tband HS curls Stairs 4" step over stp going up 2# LAQ 3x10 Leg press 20# 2x5 both legs Right leg only now weight 2x5 and then both legs no weight working on flexion   04/29/22 NuStep L5 x 6 minutes STM to R prepatellar quad, R prox calf, distal hamstring SAQ with 3# weight, 2 x 10 reps SLR, 2 x 10 with 2# resistance Bridge over physioball 2 x 10 reps Repeat with B hip IR 2 x 10  reps Seated AROM for L knee flexion Resisted gait with 20#, 3 reps forward, 3 reps backward Declined VASO   04/27/22 NuStep L5 x 6 minutes Supine knee press with heel elevated on bolster, 5 x 5 sec hold with light overpressure. Knee nearly flat on mat after stretch. Heel slides x 10 reps SAQ with 2# weight, 10 reps SLR 10 reps, repeat with 2#, 10 reps. Seated LAQ 10 reps with 2#. Standing TKE against Blue Tband x 10 AAROM for knee flexion-4 x 5 sec hold 120 degrees Alternating step taps on 4" step with U HHA 10 reps each Step ups, R foot on bottom step, L foot  step to 2nd step, UUE suppor ton handrail. She had more difficulty with this one, used arm to pull to A. Ambulated with emphasis on allowing R knee to flex naturally at HO, 1 x 200' Vaso at 34 degrees, low pressure to R knee x 10 minutes.  04/22/22 STM of the scar and tissue around the knee and patella PROM of the right knee flexion and extension Nustep Level 5 x 6 minutes Bike partial revolutions x 5 minutes HS curls 15# 2x10 Stairs with hand rails one at a time, tried up step over step but needed help Gait with HHA working on speed and trust LAQ focus on TKE 2# 2x10   PATIENT EDUCATION:  Education details: POC and HEP Person educated: Patient and Child(ren) Education method: Explanation Education comprehension: verbalized understanding and returned demonstration   HOME EXERCISE PROGRAM: Calf raises in RW  Mini squats in RW  Seated LAQ  Supine SAQ   ASSESSMENT:  CLINICAL IMPRESSION: Patient continues to be very stiff and sore, however I feel that she was able to do the stairs better today and able to clear the toe on 6" steps iwthout much hip hike or circumduction, she did tell me that she is scared of falling so she does not walk much and wanted CGA to walk outside  ACTIVITY LIMITATIONS lifting, bending, sitting, standing, squatting, stairs, toileting, hygiene/grooming, and locomotion level  REHAB POTENTIAL:  Good  CLINICAL DECISION MAKING: Stable/uncomplicated  EVALUATION COMPLEXITY: Low   GOALS: Goals reviewed with patient? Yes  SHORT TERM GOALS: Target date: 05/12/22  Patient will be independent with initial HEP. Goal status:met  2.  Patient will report a decrease in R knee pain to 2/10 or better.  Baseline: 5/10 Goal status: ongoing   LONG TERM GOALS: Target date: 06/16/22  Patient will be independent with advanced/ongoing HEP to improve outcomes and carryover.  Goal status: ongoing 2.  Patient will demonstrate improved R knee AROM to >/= 5-120 deg to allow for normal gait, stair mechanics, and to be able to sit and stand from her toilet. Baseline: 20-0-75 Goal status: INITIAL  3.  Patient will demonstrate improved functional LE strength as demonstrated by 4/5 in all weak muscle groups. Goal status: ongoing  4.  Patient will be able to ambulate 600' with normal gait pattern without increased pain to access community.  Baseline: walking w/RW Goal status:ongoing  5. Patient will be able to ascend/descend stairs with 1 HR and reciprocal step pattern safely to access home and community.  Goal status: INITIAL  6.  Patient will demonstrate TUG score to <15s w/o AD to decrease fall risk and return to walking in her community  Baseline: 29.87s Goal status:ongoing  7.  Patient will demonstrate 5xSTS score to < 20s to decrease fall risk and show functional strength improvement.  Baseline: 48.13s Goal status: INITIAL    PLAN: PT FREQUENCY: 2x/week  PT DURATION: 12 weeks  PLANNED INTERVENTIONS: Therapeutic exercises, Therapeutic activity, Neuromuscular re-education, Balance training, Gait training, Patient/Family education, Self Care, Joint mobilization, Stair training, Electrical stimulation, Cryotherapy, Moist heat, Vasopneumatic device, Ionotophoresis 39m/ml Dexamethasone, and Manual therapy  PLAN FOR NEXT SESSION: continue to work on a natural bend and better  gait   MLum Babe PT 05/06/2022, 4:37 PM

## 2022-05-11 ENCOUNTER — Ambulatory Visit: Payer: Medicare Other | Attending: Orthopedic Surgery | Admitting: Physical Therapy

## 2022-05-11 ENCOUNTER — Encounter: Payer: Self-pay | Admitting: Physical Therapy

## 2022-05-11 DIAGNOSIS — M25561 Pain in right knee: Secondary | ICD-10-CM | POA: Diagnosis present

## 2022-05-11 DIAGNOSIS — M25661 Stiffness of right knee, not elsewhere classified: Secondary | ICD-10-CM | POA: Diagnosis present

## 2022-05-11 DIAGNOSIS — M6281 Muscle weakness (generalized): Secondary | ICD-10-CM | POA: Diagnosis present

## 2022-05-11 DIAGNOSIS — R2689 Other abnormalities of gait and mobility: Secondary | ICD-10-CM | POA: Diagnosis present

## 2022-05-11 DIAGNOSIS — R600 Localized edema: Secondary | ICD-10-CM | POA: Insufficient documentation

## 2022-05-11 NOTE — Therapy (Signed)
OUTPATIENT PHYSICAL THERAPY LOWER EXTREMITY TREATMENT   Patient Name: Gloria Mckinney MRN: [id number] DOB:1946/06/19, 76 y.o., female Today's Date: 05/11/2022   PT End of Session - 05/11/22 1622     Visit Number 9    Date for PT Re-Evaluation 06/16/22    Authorization Type Medicare    PT Start Time 1615    PT Stop Time 1700    PT Time Calculation (min) 45 min    Activity Tolerance Patient tolerated treatment well    Behavior During Therapy [facility] for tasks assessed/performed                Past Medical History:  Diagnosis Date   Cancer ([place])    GERD (gastroesophageal reflux disease)    History of radiation therapy 01/16/19- 02/13/19   left breast 15 fractions of 2.67 Gy for a total of 40.05 Gy. Left breast boost 5 fractions of 2 Gy for a total of 10 Gy   Hypertension    Mixed hyperlipidemia    OA (osteoarthritis)    knee right   Peripheral neuropathy    PVD (peripheral vascular disease) ([place])    Type 2 diabetes mellitus treated with insulin [facility])    endocrinologist--- dr Providence Crosby Melody   Past Surgical History:  Procedure Laterality Date   BREAST BIOPSY Bilateral 2000   benign   BREAST LUMPECTOMY WITH RADIOACTIVE SEED AND SENTINEL LYMPH NODE BIOPSY Left 08/15/2018   Procedure: LEFT BREAST LUMPECTOMY WITH RADIOACTIVE SEED AND LEFT AXILLARY DEEP SENTINEL LYMPH NODE BIOPSY INJECT BLUE DYE LEFT BREAST;  Surgeon: Fanny Skates, MD;  Location: [place];  Service: General;  Laterality: Left;   PORT-A-CATH REMOVAL Right 08/09/2019   Procedure: REMOVAL PORT-A-CATH;  Surgeon: Fanny Skates, MD;  Location: [place];  Service: General;  Laterality: Right;   PORTACATH PLACEMENT Right 08/15/2018   Procedure: INSERTION PORT-A-CATH WITH ULTRASOUND;  Surgeon: Fanny Skates, MD;  Location: [place];  Service: General;  Laterality: Right;   SHOULDER ARTHROSCOPY Right 06-16-2002   dr graves   SHOULDER OPEN ROTATOR CUFF REPAIR  Right 07-28-2000    dr graves   VAGINAL HYSTERECTOMY  1992   with [place]  2007   Patient Active Problem List   Diagnosis Date Noted   Port-A-Cath in place 09/19/2018   Malignant neoplasm of upper-inner quadrant of left breast in female, estrogen receptor positive ([place]) 07/27/2018   Dyspnea on exertion 10/21/2016   Type 2 diabetes mellitus without complication, without long-term current use of insulin ([place]) 10/21/2016   Dyslipidemia 10/21/2016    PCP: [name]  REFERRING PROVIDER: Dorna Leitz   REFERRING DIAG: R TKA   THERAPY DIAG:  Acute pain of right knee  Stiffness of right knee, not elsewhere classified  Muscle weakness (generalized)  Other abnormalities of gait and mobility  Localized edema  Rationale for Evaluation and Treatment Rehabilitation  ONSET DATE: 03/25/22  SUBJECTIVE:   SUBJECTIVE STATEMENT: Patient still favoring the knee , c/o pain, reports that nothing makes it worse  PERTINENT HISTORY: DM, HTN, PVD, shoulder sx, breast cancer sx   PAIN:  Are you having pain? Yes: NPRS scale: 4/10 Pain location: R knee Pain description: burning, achy  Aggravating factors: standing up, sitting, walking for a long time  Relieving factors: oxycodone, celebrix, ice  PRECAUTIONS: Fall  WEIGHT BEARING RESTRICTIONS No  FALLS:  Has patient fallen in last 6 months? No  LIVING ENVIRONMENT: Lives with: lives with their family Lives in:  House/apartment Stairs: Yes: Internal: 15 steps; on right going up Has following equipment at home: Quad cane small base and Walker - 2 wheeled  OCCUPATION: retired  PLOF: Independent  PATIENT GOALS no pain, stand up and walk    OBJECTIVE:  COGNITION:  Overall cognitive status: Within functional limits for tasks assessed     SENSATION: WFL   MUSCLE LENGTH: Hamstrings: Right mod tightness  POSTURE: rounded shoulders, forward head, flexed trunk , and weight shift left  PALPATION: Warm, and tender  around R knee   LOWER EXTREMITY ROM:  Active ROM Right eval Right 04/22/22 Right  05/04/22 Right AROM 05/11/22  Hip flexion      Hip extension      Hip abduction      Hip adduction      Hip internal rotation      Hip external rotation      Knee flexion 75 100 100 105  Knee extension -_0 Ankle dorsiflexion      Ankle plantarflexion      Ankle inversion      Ankle eversion       (Blank rows = not tested)  LOWER EXTREMITY MMT:  MMT Right eval Left eval  Hip flexion 3+   Hip extension    Hip abduction    Hip adduction    Hip internal rotation    Hip external rotation    Knee flexion 3+    Knee extension 4- w/pain   Ankle dorsiflexion 5   Ankle plantarflexion 5   Ankle inversion    Ankle eversion     (Blank rows = not tested)   FUNCTIONAL TESTS:  5 times sit to stand: 48.13s  Timed up and go (TUG): 29.87s  GAIT: Distance walked: in clinic distances Assistive device utilized: Environmental consultant - 2 wheeled Level of assistance: Modified independence Comments: slowed gait, antalgic pattern, decreased step and stance time on RLE   TODAY'S TREATMENT: 05/11/22 Nustep level 5 x 6 minutes Bike partial revolutions x 5 minutes Gait outside with [facility] and some CGA/HHA due to poor pattern, tends to hike hip at toe off, needed verbal and tactile cues to correct this LAQ 3x10 cues to focus on TKE PROM of the right knee, STM to the right medial knee, scar and patellar mobilizations   05/06/22 Nustep level 4 x 6 minutes Bike partial revs x 5 minutes Gait outside back door to front door.with SPC and CGA Red tband HS curls 6" toe clears 4" steps up and down with two handrails   05/04/22 Nustep level 5 x 6 minutes Walking without device but with HHA working on speed and pattern PROM focus on flexion, STM to the peripatellar area and the medial knee Bike partial revs 6" toe clears Kleenex box step overs Red tband HS curls Stairs 4" step over stp going up 2# LAQ 3x10 Leg  press 20# 2x5 both legs Right leg only now weight 2x5 and then both legs no weight working on flexion   04/29/22 NuStep L5 x 6 minutes STM to R prepatellar quad, R prox calf, distal hamstring SAQ with 3# weight, 2 x 10 reps SLR, 2 x 10 with 2# resistance Bridge over physioball 2 x 10 reps Repeat with B hip IR 2 x 10 reps Seated AROM for L knee flexion Resisted gait with 20#, 3 reps forward, 3 reps backward Declined VASO   04/27/22 NuStep L5 x 6 minutes Supine knee press with heel elevated on bolster, 5  x 5 sec hold with light overpressure. Knee nearly flat on mat after stretch. Heel slides x 10 reps SAQ with 2# weight, 10 reps SLR 10 reps, repeat with 2#, 10 reps. Seated LAQ 10 reps with 2#. Standing TKE against Blue Tband x 10 AAROM for knee flexion-4 x 5 sec hold 120 degrees Alternating step taps on 4" step with U HHA 10 reps each Step ups, R foot on bottom step, L foot step to 2nd step, UUE suppor ton handrail. She had more difficulty with this one, used arm to pull to A. Ambulated with emphasis on allowing R knee to flex naturally at HO, 1 x 200' Vaso at 34 degrees, low pressure to R knee x 10 minutes.  04/22/22 STM of the scar and tissue around the knee and patella PROM of the right knee flexion and extension Nustep Level 5 x 6 minutes Bike partial revolutions x 5 minutes HS curls 15# 2x10 Stairs with hand rails one at a time, tried up step over step but needed help Gait with HHA working on speed and trust LAQ focus on TKE 2# 2x10   PATIENT EDUCATION:  Education details: POC and HEP Person educated: Patient and Child(ren) Education method: Explanation Education comprehension: verbalized understanding and returned demonstration   HOME EXERCISE PROGRAM: Calf raises in RW  Mini squats in RW  Seated LAQ  Supine SAQ   ASSESSMENT:  CLINICAL IMPRESSION: Patient continues to be very stiff and sore, walking outside today she was really hiking hip at toe off, needed  verbal and tactile cues to bend the knee at toe off and asked her to keep this up while walking, I focused treatment on mobilization of the scar, the patella and the tissue over the medial knee where she has pain.  She did gain some ROM into flexion but still has a c/o a lot of pain  ACTIVITY LIMITATIONS lifting, bending, sitting, standing, squatting, stairs, toileting, hygiene/grooming, and locomotion level  REHAB POTENTIAL: Good  CLINICAL DECISION MAKING: Stable/uncomplicated  EVALUATION COMPLEXITY: Low   GOALS: Goals reviewed with patient? Yes  SHORT TERM GOALS: Target date: 05/12/22  Patient will be independent with initial HEP. Goal status:met  2.  Patient will report a decrease in R knee pain to 2/10 or better.  Baseline: 5/10 Goal status: ongoing   LONG TERM GOALS: Target date: 06/16/22  Patient will be independent with advanced/ongoing HEP to improve outcomes and carryover.  Goal status: ongoing 2.  Patient will demonstrate improved R knee AROM to >/= 5-120 deg to allow for normal gait, stair mechanics, and to be able to sit and stand from her toilet. Baseline: 20-0-75 Goal status: INITIAL  3.  Patient will demonstrate improved functional LE strength as demonstrated by 4/5 in all weak muscle groups. Goal status: ongoing  4.  Patient will be able to ambulate 600' with normal gait pattern without increased pain to access community.  Baseline: walking w/RW Goal status:ongoing  5. Patient will be able to ascend/descend stairs with 1 HR and reciprocal step pattern safely to access home and community.  Goal status: INITIAL  6.  Patient will demonstrate TUG score to <15s w/o AD to decrease fall risk and return to walking in her community  Baseline: 29.87s Goal status:ongoing  7.  Patient will demonstrate 5xSTS score to < 20s to decrease fall risk and show functional strength improvement.  Baseline: 48.13s Goal status: INITIAL    PLAN: PT FREQUENCY: 2x/week  PT  DURATION: 12 weeks  PLANNED  INTERVENTIONS: Therapeutic exercises, Therapeutic activity, Neuromuscular re-education, Balance training, Gait training, Patient/Family education, Self Care, Joint mobilization, Stair training, Electrical stimulation, Cryotherapy, Moist heat, Vasopneumatic device, Ionotophoresis 13m/ml Dexamethasone, and Manual therapy  PLAN FOR NEXT SESSION: continue to work on a natural bend and better gait   MLum Babe PT 05/11/2022, 4:24 PM [place]Jug[place] [id number]: [phone]  Fax:  [phone]

## 2022-05-13 ENCOUNTER — Encounter: Payer: Self-pay | Admitting: Physical Therapy

## 2022-05-13 ENCOUNTER — Ambulatory Visit: Payer: Medicare Other | Admitting: Physical Therapy

## 2022-05-13 DIAGNOSIS — R2689 Other abnormalities of gait and mobility: Secondary | ICD-10-CM

## 2022-05-13 DIAGNOSIS — M25561 Pain in right knee: Secondary | ICD-10-CM

## 2022-05-13 DIAGNOSIS — M25661 Stiffness of right knee, not elsewhere classified: Secondary | ICD-10-CM

## 2022-05-13 DIAGNOSIS — M6281 Muscle weakness (generalized): Secondary | ICD-10-CM

## 2022-05-13 NOTE — Therapy (Signed)
OUTPATIENT PHYSICAL THERAPY LOWER EXTREMITY TREATMENT Progress Note Reporting Period 04/07/22 to 05/13/22  See note below for Objective Data and Assessment of Progress/Goals.      Patient Name: Gloria Mckinney MRN: [id number] DOB:Oct 25, 1945, 76 y.o., female Today's Date: 05/13/2022   PT End of Session - 05/13/22 1634     Visit Number 10    Date for PT Re-Evaluation 06/16/22    PT Start Time 1618    PT Stop Time 1700    PT Time Calculation (min) 42 min    Activity Tolerance Patient tolerated treatment well    Behavior During Therapy [facility] for tasks assessed/performed                Past Medical History:  Diagnosis Date   Cancer ([place])    GERD (gastroesophageal reflux disease)    History of radiation therapy 01/16/19- 02/13/19   left breast 15 fractions of 2.67 Gy for a total of 40.05 Gy. Left breast boost 5 fractions of 2 Gy for a total of 10 Gy   Hypertension    Mixed hyperlipidemia    OA (osteoarthritis)    knee right   Peripheral neuropathy    PVD (peripheral vascular disease) ([place])    Type 2 diabetes mellitus treated with insulin [facility])    endocrinologist--- dr Providence Crosby Cibrian   Past Surgical History:  Procedure Laterality Date   BREAST BIOPSY Bilateral 2000   benign   BREAST LUMPECTOMY WITH RADIOACTIVE SEED AND SENTINEL LYMPH NODE BIOPSY Left 08/15/2018   Procedure: LEFT BREAST LUMPECTOMY WITH RADIOACTIVE SEED AND LEFT AXILLARY DEEP SENTINEL LYMPH NODE BIOPSY INJECT BLUE DYE LEFT BREAST;  Surgeon: Fanny Skates, MD;  Location: [place];  Service: General;  Laterality: Left;   PORT-A-CATH REMOVAL Right 08/09/2019   Procedure: REMOVAL PORT-A-CATH;  Surgeon: Fanny Skates, MD;  Location: [place];  Service: General;  Laterality: Right;   PORTACATH PLACEMENT Right 08/15/2018   Procedure: INSERTION PORT-A-CATH WITH ULTRASOUND;  Surgeon: Fanny Skates, MD;  Location: [place];  Service: General;  Laterality:  Right;   SHOULDER ARTHROSCOPY Right 06-16-2002   dr graves   SHOULDER OPEN ROTATOR CUFF REPAIR Right 07-28-2000    dr graves   VAGINAL HYSTERECTOMY  1992   with [place]  2007   Patient Active Problem List   Diagnosis Date Noted   Port-A-Cath in place 09/19/2018   Malignant neoplasm of upper-inner quadrant of left breast in female, estrogen receptor positive ([place]) 07/27/2018   Dyspnea on exertion 10/21/2016   Type 2 diabetes mellitus without complication, without long-term current use of insulin ([place]) 10/21/2016   Dyslipidemia 10/21/2016    PCP: [name]  REFERRING PROVIDER: Dorna Leitz   REFERRING DIAG: R TKA   THERAPY DIAG:  Acute pain of right knee  Stiffness of right knee, not elsewhere classified  Muscle weakness (generalized)  Other abnormalities of gait and mobility  Rationale for Evaluation and Treatment Rehabilitation  ONSET DATE: 03/25/22  SUBJECTIVE:   SUBJECTIVE STATEMENT: Patient still favoring the knee , c/o pain, reports that nothing makes it worse  PERTINENT HISTORY: DM, HTN, PVD, shoulder sx, breast cancer sx   PAIN:  Are you having pain? Yes: NPRS scale: 4/10 Pain location: R knee Pain description: burning, achy  Aggravating factors: standing up, sitting, walking for a long time  Relieving factors: oxycodone, celebrix, ice  PRECAUTIONS: Fall  WEIGHT BEARING RESTRICTIONS No  FALLS:  Has patient fallen in last 6 months?  No  LIVING ENVIRONMENT: Lives with: lives with their family Lives in: House/apartment Stairs: Yes: Internal: 15 steps; on right going up Has following equipment at home: Gaffer - 2 wheeled  OCCUPATION: retired  PLOF: Independent  PATIENT GOALS no pain, stand up and walk    OBJECTIVE:  COGNITION:  Overall cognitive status: Within functional limits for tasks assessed     SENSATION: WFL   MUSCLE LENGTH: Hamstrings: Right mod tightness  POSTURE: rounded shoulders,  forward head, flexed trunk , and weight shift left  PALPATION: Warm, and tender around R knee   LOWER EXTREMITY ROM:  Active ROM Right eval Right 04/22/22 Right  05/04/22 Right AROM 05/11/22  Hip flexion      Hip extension      Hip abduction      Hip adduction      Hip internal rotation      Hip external rotation      Knee flexion 75 100 100 105  Knee extension -_0 Ankle dorsiflexion      Ankle plantarflexion      Ankle inversion      Ankle eversion       (Blank rows = not tested)  LOWER EXTREMITY MMT:  MMT Right eval Left eval  Hip flexion 3+   Hip extension    Hip abduction    Hip adduction    Hip internal rotation    Hip external rotation    Knee flexion 3+    Knee extension 4- w/pain   Ankle dorsiflexion 5   Ankle plantarflexion 5   Ankle inversion    Ankle eversion     (Blank rows = not tested)   FUNCTIONAL TESTS:  5 times sit to stand: 48.13s  Timed up and go (TUG): 29.87s  GAIT: Distance walked: in clinic distances Assistive device utilized: Environmental consultant - 2 wheeled Level of assistance: Modified independence Comments: slowed gait, antalgic pattern, decreased step and stance time on RLE   TODAY'S TREATMENT: 05/13/22 Nustep level 5 x 6 minutes Bike partial revs 5 minutes STM to the knee , patella, PROM Vaso medium pressure in elevation right knee for pain and swelling PROM today to 110 degrees felxion  05/11/22 Nustep level 5 x 6 minutes Bike partial revolutions x 5 minutes Gait outside with [facility] and some CGA/HHA due to poor pattern, tends to hike hip at toe off, needed verbal and tactile cues to correct this LAQ 3x10 cues to focus on TKE PROM of the right knee, STM to the right medial knee, scar and patellar mobilizations   05/06/22 Nustep level 4 x 6 minutes Bike partial revs x 5 minutes Gait outside back door to front door.with SPC and CGA Red tband HS curls 6" toe clears 4" steps up and down with two handrails   05/04/22 Nustep  level 5 x 6 minutes Walking without device but with HHA working on speed and pattern PROM focus on flexion, STM to the peripatellar area and the medial knee Bike partial revs 6" toe clears Kleenex box step overs Red tband HS curls Stairs 4" step over stp going up 2# LAQ 3x10 Leg press 20# 2x5 both legs Right leg only now weight 2x5 and then both legs no weight working on flexion   04/29/22 NuStep L5 x 6 minutes STM to R prepatellar quad, R prox calf, distal hamstring SAQ with 3# weight, 2 x 10 reps SLR, 2 x 10 with 2# resistance Bridge over  physioball 2 x 10 reps Repeat with B hip IR 2 x 10 reps Seated AROM for L knee flexion Resisted gait with 20#, 3 reps forward, 3 reps backward Declined VASO   04/27/22 NuStep L5 x 6 minutes Supine knee press with heel elevated on bolster, 5 x 5 sec hold with light overpressure. Knee nearly flat on mat after stretch. Heel slides x 10 reps SAQ with 2# weight, 10 reps SLR 10 reps, repeat with 2#, 10 reps. Seated LAQ 10 reps with 2#. Standing TKE against Blue Tband x 10 AAROM for knee flexion-4 x 5 sec hold 120 degrees Alternating step taps on 4" step with U HHA 10 reps each Step ups, R foot on bottom step, L foot step to 2nd step, UUE suppor ton handrail. She had more difficulty with this one, used arm to pull to A. Ambulated with emphasis on allowing R knee to flex naturally at HO, 1 x 200' Vaso at 34 degrees, low pressure to R knee x 10 minutes.  04/22/22 STM of the scar and tissue around the knee and patella PROM of the right knee flexion and extension Nustep Level 5 x 6 minutes Bike partial revolutions x 5 minutes HS curls 15# 2x10 Stairs with hand rails one at a time, tried up step over step but needed help Gait with HHA working on speed and trust LAQ focus on TKE 2# 2x10   PATIENT EDUCATION:  Education details: POC and HEP Person educated: Patient and Child(ren) Education method: Explanation Education comprehension: verbalized  understanding and returned demonstration   HOME EXERCISE PROGRAM: Calf raises in RW  Mini squats in RW  Seated LAQ  Supine SAQ   ASSESSMENT:  CLINICAL IMPRESSION: Patient continues to be very stiff and sore, I continued to work on the STM around the knee to help with pain and motion, seems to help her pain and she does move better after, she can bend with cues during walking but tends to revert back to the stiff leg  ACTIVITY LIMITATIONS lifting, bending, sitting, standing, squatting, stairs, toileting, hygiene/grooming, and locomotion level  REHAB POTENTIAL: Good  CLINICAL DECISION MAKING: Stable/uncomplicated  EVALUATION COMPLEXITY: Low   GOALS: Goals reviewed with patient? Yes  SHORT TERM GOALS: Target date: 05/12/22  Patient will be independent with initial HEP. Goal status:met  2.  Patient will report a decrease in R knee pain to 2/10 or better.  Baseline: 5/10 Goal status: ongoing   LONG TERM GOALS: Target date: 06/16/22  Patient will be independent with advanced/ongoing HEP to improve outcomes and carryover.  Goal status: ongoing 2.  Patient will demonstrate improved R knee AROM to >/= 5-120 deg to allow for normal gait, stair mechanics, and to be able to sit and stand from her toilet. Baseline: 20-0-75 Goal status: parially met  3.  Patient will demonstrate improved functional LE strength as demonstrated by 4/5 in all weak muscle groups. Goal status: ongoing  4.  Patient will be able to ambulate 600' with normal gait pattern without increased pain to access community.  Baseline: walking w/RW Goal status:ongoing  5. Patient will be able to ascend/descend stairs with 1 HR and reciprocal step pattern safely to access home and community.  Goal status: ongoing  6.  Patient will demonstrate TUG score to <15s w/o AD to decrease fall risk and return to walking in her community  Baseline: 29.87s Goal status:ongoing  7.  Patient will demonstrate 5xSTS score to <  20s to decrease fall risk and show  functional strength improvement.  Baseline: 48.13s Goal status: INITIAL    PLAN: PT FREQUENCY: 2x/week  PT DURATION: 12 weeks  PLANNED INTERVENTIONS: Therapeutic exercises, Therapeutic activity, Neuromuscular re-education, Balance training, Gait training, Patient/Family education, Self Care, Joint mobilization, Stair training, Electrical stimulation, Cryotherapy, Moist heat, Vasopneumatic device, Ionotophoresis 73m/ml Dexamethasone, and Manual therapy  PLAN FOR NEXT SESSION: continue to work on a natural bend and better gait   MLum Babe PT 05/13/2022, 4:34 PM [place] [address]Phone: [phone]  Fax:  [phone]

## 2022-05-18 ENCOUNTER — Ambulatory Visit: Payer: Medicare Other | Admitting: Physical Therapy

## 2022-05-18 ENCOUNTER — Encounter: Payer: Self-pay | Admitting: Physical Therapy

## 2022-05-18 DIAGNOSIS — M25561 Pain in right knee: Secondary | ICD-10-CM | POA: Diagnosis not present

## 2022-05-18 DIAGNOSIS — R2689 Other abnormalities of gait and mobility: Secondary | ICD-10-CM

## 2022-05-18 DIAGNOSIS — R600 Localized edema: Secondary | ICD-10-CM

## 2022-05-18 DIAGNOSIS — M6281 Muscle weakness (generalized): Secondary | ICD-10-CM

## 2022-05-18 DIAGNOSIS — M25661 Stiffness of right knee, not elsewhere classified: Secondary | ICD-10-CM

## 2022-05-18 NOTE — Therapy (Signed)
OUTPATIENT PHYSICAL THERAPY LOWER EXTREMITY TREATMENT    Patient Name: Gloria Mckinney MRN: [id number] DOB:02-Sep-1945, 76 y.o., female Today's Date: 05/18/2022   PT End of Session - 05/18/22 1624     Visit Number 11    Date for PT Re-Evaluation 06/16/22    Authorization Type Medicare    PT Start Time 1616    PT Stop Time 1710    PT Time Calculation (min) 54 min    Activity Tolerance Patient tolerated treatment well    Behavior During Therapy [facility] for tasks assessed/performed                Past Medical History:  Diagnosis Date   Cancer ([place])    GERD (gastroesophageal reflux disease)    History of radiation therapy 01/16/19- 02/13/19   left breast 15 fractions of 2.67 Gy for a total of 40.05 Gy. Left breast boost 5 fractions of 2 Gy for a total of 10 Gy   Hypertension    Mixed hyperlipidemia    OA (osteoarthritis)    knee right   Peripheral neuropathy    PVD (peripheral vascular disease) ([place])    Type 2 diabetes mellitus treated with insulin [facility])    endocrinologist--- dr Providence Crosby [place]   Past Surgical History:  Procedure Laterality Date   BREAST BIOPSY Bilateral 2000   benign   BREAST LUMPECTOMY WITH RADIOACTIVE SEED AND SENTINEL LYMPH NODE BIOPSY Left 08/15/2018   Procedure: LEFT BREAST LUMPECTOMY WITH RADIOACTIVE SEED AND LEFT AXILLARY DEEP SENTINEL LYMPH NODE BIOPSY INJECT BLUE DYE LEFT BREAST;  Surgeon: Fanny Skates, MD;  Location: [place];  Service: General;  Laterality: Left;   PORT-A-CATH REMOVAL Right 08/09/2019   Procedure: REMOVAL PORT-A-CATH;  Surgeon: Fanny Skates, MD;  Location: [place];  Service: General;  Laterality: Right;   PORTACATH PLACEMENT Right 08/15/2018   Procedure: INSERTION PORT-A-CATH WITH ULTRASOUND;  Surgeon: Fanny Skates, MD;  Location: [place];  Service: General;  Laterality: Right;   SHOULDER ARTHROSCOPY Right 06-16-2002   dr graves   SHOULDER OPEN ROTATOR CUFF REPAIR  Right 07-28-2000    dr graves   VAGINAL HYSTERECTOMY  1992   with [place]  2007   Patient Active Problem List   Diagnosis Date Noted   Port-A-Cath in place 09/19/2018   Malignant neoplasm of upper-inner quadrant of left breast in female, estrogen receptor positive ([place]) 07/27/2018   Dyspnea on exertion 10/21/2016   Type 2 diabetes mellitus without complication, without long-term current use of insulin ([place]) 10/21/2016   Dyslipidemia 10/21/2016    PCP: [name]  REFERRING PROVIDER: Dorna Leitz   REFERRING DIAG: R TKA   THERAPY DIAG:  Acute pain of right knee  Stiffness of right knee, not elsewhere classified  Muscle weakness (generalized)  Other abnormalities of gait and mobility  Localized edema  Rationale for Evaluation and Treatment Rehabilitation  ONSET DATE: 03/25/22  SUBJECTIVE:   SUBJECTIVE STATEMENT: Pain, yes, same  PERTINENT HISTORY: DM, HTN, PVD, shoulder sx, breast cancer sx   PAIN:  Are you having pain? Yes: NPRS scale: 4/10 Pain location: R knee Pain description: burning, achy  Aggravating factors: standing up, sitting, walking for a long time  Relieving factors: oxycodone, celebrix, ice  PRECAUTIONS: Fall  WEIGHT BEARING RESTRICTIONS No  FALLS:  Has patient fallen in last 6 months? No  LIVING ENVIRONMENT: Lives with: lives with their family Lives in: House/apartment Stairs: Yes: Internal: 15 steps; on right going up  Has following equipment at home: Gaffer - 2 wheeled  OCCUPATION: retired  PLOF: Independent  PATIENT GOALS no pain, stand up and walk    OBJECTIVE:  COGNITION:  Overall cognitive status: Within functional limits for tasks assessed     SENSATION: WFL   MUSCLE LENGTH: Hamstrings: Right mod tightness  POSTURE: rounded shoulders, forward head, flexed trunk , and weight shift left  PALPATION: Warm, and tender around R knee   LOWER EXTREMITY ROM:  Active ROM  Right eval Right 04/22/22 Right  05/04/22 Right AROM 05/11/22  Hip flexion      Hip extension      Hip abduction      Hip adduction      Hip internal rotation      Hip external rotation      Knee flexion 75 100 100 105  Knee extension -'20 10 12   ' Ankle dorsiflexion      Ankle plantarflexion      Ankle inversion      Ankle eversion       (Blank rows = not tested)  LOWER EXTREMITY MMT:  MMT Right eval Left eval  Hip flexion 3+   Hip extension    Hip abduction    Hip adduction    Hip internal rotation    Hip external rotation    Knee flexion 3+    Knee extension 4- w/pain   Ankle dorsiflexion 5   Ankle plantarflexion 5   Ankle inversion    Ankle eversion     (Blank rows = not tested)   FUNCTIONAL TESTS:  5 times sit to stand: 48.13s  Timed up and go (TUG): 29.87s     GAIT: Distance walked: in clinic distances Assistive device utilized: Environmental consultant - 2 wheeled Level of assistance: Modified independence Comments: slowed gait, antalgic pattern, decreased step and stance time on RLE   TODAY'S TREATMENT: 05/18/22 Bike partial revs x 6 minutes Nustep level 5 x 5 minutes TUG 20 seconds STM and passive motion to the right knee LAQ/SAQ sitting 2x10 each Standing TKE with red tband Standing right hip flexion with bent knee Standing hip abduction Vaso medium pressure right knee 37 degrees   05/13/22 Nustep level 5 x 6 minutes Bike partial revs 5 minutes STM to the knee , patella, PROM Vaso medium pressure in elevation right knee for pain and swelling PROM today to 110 degrees felxion  05/11/22 Nustep level 5 x 6 minutes Bike partial revolutions x 5 minutes Gait outside with [facility] and some CGA/HHA due to poor pattern, tends to hike hip at toe off, needed verbal and tactile cues to correct this LAQ 3x10 cues to focus on TKE PROM of the right knee, STM to the right medial knee, scar and patellar mobilizations   05/06/22 Nustep level 4 x 6 minutes Bike partial revs x 5  minutes Gait outside back door to front door.with SPC and CGA Red tband HS curls 6" toe clears 4" steps up and down with two handrails   05/04/22 Nustep level 5 x 6 minutes Walking without device but with HHA working on speed and pattern PROM focus on flexion, STM to the peripatellar area and the medial knee Bike partial revs 6" toe clears Kleenex box step overs Red tband HS curls Stairs 4" step over stp going up 2# LAQ 3x10 Leg press 20# 2x5 both legs Right leg only now weight 2x5 and then both legs no weight working on flexion  04/29/22 NuStep L5 x 6 minutes STM to R prepatellar quad, R prox calf, distal hamstring SAQ with 3# weight, 2 x 10 reps SLR, 2 x 10 with 2# resistance Bridge over physioball 2 x 10 reps Repeat with B hip IR 2 x 10 reps Seated AROM for L knee flexion Resisted gait with 20#, 3 reps forward, 3 reps backward Declined VASO   04/27/22 NuStep L5 x 6 minutes Supine knee press with heel elevated on bolster, 5 x 5 sec hold with light overpressure. Knee nearly flat on mat after stretch. Heel slides x 10 reps SAQ with 2# weight, 10 reps SLR 10 reps, repeat with 2#, 10 reps. Seated LAQ 10 reps with 2#. Standing TKE against Blue Tband x 10 AAROM for knee flexion-4 x 5 sec hold 120 degrees Alternating step taps on 4" step with U HHA 10 reps each Step ups, R foot on bottom step, L foot step to 2nd step, UUE suppor ton handrail. She had more difficulty with this one, used arm to pull to A. Ambulated with emphasis on allowing R knee to flex naturally at HO, 1 x 200' Vaso at 34 degrees, low pressure to R knee x 10 minutes.  04/22/22 STM of the scar and tissue around the knee and patella PROM of the right knee flexion and extension Nustep Level 5 x 6 minutes Bike partial revolutions x 5 minutes HS curls 15# 2x10 Stairs with hand rails one at a time, tried up step over step but needed help Gait with HHA working on speed and trust LAQ focus on TKE 2#  2x10   PATIENT EDUCATION:  Education details: POC and HEP Person educated: Patient and Clinical cytogeneticist) Education method: Explanation Education comprehension: verbalized understanding and returned demonstration   HOME EXERCISE PROGRAM: Calf raises in RW  Mini squats in RW  Seated LAQ  Supine SAQ   ASSESSMENT:  CLINICAL IMPRESSION: Patient reports that she was hurting badly this morning, and thought she would not be able to do anything, we started with some weight shifts and really eased into the movements and she reported that she was surprised that she could do without hurting.  Much faster with the TUG  ACTIVITY LIMITATIONS lifting, bending, sitting, standing, squatting, stairs, toileting, hygiene/grooming, and locomotion level  REHAB POTENTIAL: Good  CLINICAL DECISION MAKING: Stable/uncomplicated  EVALUATION COMPLEXITY: Low   GOALS: Goals reviewed with patient? Yes  SHORT TERM GOALS: Target date: 05/12/22  Patient will be independent with initial HEP. Goal status:met  2.  Patient will report a decrease in R knee pain to 2/10 or better.  Baseline: 5/10 Goal status: ongoing   LONG TERM GOALS: Target date: 06/16/22  Patient will be independent with advanced/ongoing HEP to improve outcomes and carryover.  Goal status: ongoing 2.  Patient will demonstrate improved R knee AROM to >/= 5-120 deg to allow for normal gait, stair mechanics, and to be able to sit and stand from her toilet. Baseline: 20-0-75 Goal status: parially met  3.  Patient will demonstrate improved functional LE strength as demonstrated by 4/5 in all weak muscle groups. Goal status: ongoing  4.  Patient will be able to ambulate 600' with normal gait pattern without increased pain to access community.  Baseline: walking w/RW Goal status:ongoing  5. Patient will be able to ascend/descend stairs with 1 HR and reciprocal step pattern safely to access home and community.  Goal status: ongoing  6.   Patient will demonstrate TUG score to <15s w/o AD to  decrease fall risk and return to walking in her community  Baseline: 29.87s Goal status:ongoing  7.  Patient will demonstrate 5xSTS score to < 20s to decrease fall risk and show functional strength improvement.  Baseline: 48.13s Goal status: INITIAL    PLAN: PT FREQUENCY: 2x/week  PT DURATION: 12 weeks  PLANNED INTERVENTIONS: Therapeutic exercises, Therapeutic activity, Neuromuscular re-education, Balance training, Gait training, Patient/Family education, Self Care, Joint mobilization, Stair training, Electrical stimulation, Cryotherapy, Moist heat, Vasopneumatic device, Ionotophoresis 35m/ml Dexamethasone, and Manual therapy  PLAN FOR NEXT SESSION: continue to work on a natural bend and better gait   MLum Babe PT 05/18/2022, 4:25 PM [place] [address]Phone: [phone]  Fax:  [phone]

## 2022-05-21 ENCOUNTER — Ambulatory Visit (HOSPITAL_COMMUNITY)
Admission: RE | Admit: 2022-05-21 | Discharge: 2022-05-21 | Disposition: A | Discharge status: Home / Self Care | Payer: Medicare Other | Source: Ambulatory Visit | Attending: Internal Medicine | Admitting: Internal Medicine

## 2022-05-21 ENCOUNTER — Other Ambulatory Visit (HOSPITAL_COMMUNITY): Payer: Self-pay | Admitting: Orthopedic Surgery

## 2022-05-21 DIAGNOSIS — M79604 Pain in right leg: Secondary | ICD-10-CM | POA: Diagnosis present

## 2022-05-21 DIAGNOSIS — M7989 Other specified soft tissue disorders: Secondary | ICD-10-CM | POA: Diagnosis present

## 2022-05-27 ENCOUNTER — Encounter: Payer: Self-pay | Admitting: Physical Therapy

## 2022-05-27 ENCOUNTER — Ambulatory Visit: Payer: Medicare Other | Admitting: Physical Therapy

## 2022-05-27 DIAGNOSIS — R600 Localized edema: Secondary | ICD-10-CM

## 2022-05-27 DIAGNOSIS — M25561 Pain in right knee: Secondary | ICD-10-CM | POA: Diagnosis not present

## 2022-05-27 DIAGNOSIS — R2689 Other abnormalities of gait and mobility: Secondary | ICD-10-CM

## 2022-05-27 DIAGNOSIS — M6281 Muscle weakness (generalized): Secondary | ICD-10-CM

## 2022-05-27 DIAGNOSIS — M25661 Stiffness of right knee, not elsewhere classified: Secondary | ICD-10-CM

## 2022-05-27 NOTE — Therapy (Signed)
OUTPATIENT PHYSICAL THERAPY LOWER EXTREMITY TREATMENT    Patient Name: Gloria Mckinney MRN: [id number] DOB:1945-10-30, 75 y.o., female Today's Date: 05/27/2022   PT End of Session - 05/27/22 1623     Visit Number 12    Date for PT Re-Evaluation 06/16/22    Authorization Type Medicare    PT Start Time 1616    PT Stop Time 1700    PT Time Calculation (min) 44 min    Activity Tolerance Patient tolerated treatment well    Behavior During Therapy [facility] for tasks assessed/performed                Past Medical History:  Diagnosis Date   Cancer ([place])    GERD (gastroesophageal reflux disease)    History of radiation therapy 01/16/19- 02/13/19   left breast 15 fractions of 2.67 Gy for a total of 40.05 Gy. Left breast boost 5 fractions of 2 Gy for a total of 10 Gy   Hypertension    Mixed hyperlipidemia    OA (osteoarthritis)    knee right   Peripheral neuropathy    PVD (peripheral vascular disease) ([place])    Type 2 diabetes mellitus treated with insulin [facility])    endocrinologist--- dr Providence Crosby [place]   Past Surgical History:  Procedure Laterality Date   BREAST BIOPSY Bilateral 2000   benign   BREAST LUMPECTOMY WITH RADIOACTIVE SEED AND SENTINEL LYMPH NODE BIOPSY Left 08/15/2018   Procedure: LEFT BREAST LUMPECTOMY WITH RADIOACTIVE SEED AND LEFT AXILLARY DEEP SENTINEL LYMPH NODE BIOPSY INJECT BLUE DYE LEFT BREAST;  Surgeon: Fanny Skates, MD;  Location: [place];  Service: General;  Laterality: Left;   PORT-A-CATH REMOVAL Right 08/09/2019   Procedure: REMOVAL PORT-A-CATH;  Surgeon: Fanny Skates, MD;  Location: [place];  Service: General;  Laterality: Right;   PORTACATH PLACEMENT Right 08/15/2018   Procedure: INSERTION PORT-A-CATH WITH ULTRASOUND;  Surgeon: Fanny Skates, MD;  Location: [place];  Service: General;  Laterality: Right;   SHOULDER ARTHROSCOPY Right 06-16-2002   dr graves   SHOULDER OPEN ROTATOR CUFF REPAIR  Right 07-28-2000    dr graves   VAGINAL HYSTERECTOMY  1992   with [place]  2007   Patient Active Problem List   Diagnosis Date Noted   Port-A-Cath in place 09/19/2018   Malignant neoplasm of upper-inner quadrant of left breast in female, estrogen receptor positive ([place]) 07/27/2018   Dyspnea on exertion 10/21/2016   Type 2 diabetes mellitus without complication, without long-term current use of insulin ([place]) 10/21/2016   Dyslipidemia 10/21/2016    PCP: [name]  REFERRING PROVIDER: Dorna Leitz   REFERRING DIAG: R TKA   THERAPY DIAG:  Acute pain of right knee  Stiffness of right knee, not elsewhere classified  Muscle weakness (generalized)  Other abnormalities of gait and mobility  Localized edema  Rationale for Evaluation and Treatment Rehabilitation  ONSET DATE: 03/25/22  SUBJECTIVE:   SUBJECTIVE STATEMENT: PAtient and daughter reports pain and swelling, they report seeing surgeon last week, x-rays negative , [place] negative, the MD feels normal  PERTINENT HISTORY: DM, HTN, PVD, shoulder sx, breast cancer sx   PAIN:  Are you having pain? Yes: NPRS scale: 4/10 Pain location: R knee Pain description: burning, achy  Aggravating factors: standing up, sitting, walking for a long time  Relieving factors: oxycodone, celebrix, ice  PRECAUTIONS: Fall  WEIGHT BEARING RESTRICTIONS No  FALLS:  Has patient fallen in last 6 months? No  LIVING  ENVIRONMENT: Lives with: lives with their family Lives in: House/apartment Stairs: Yes: Internal: 15 steps; on right going up Has following equipment at home: Gaffer - 2 wheeled  OCCUPATION: retired  PLOF: Independent  PATIENT GOALS no pain, stand up and walk    OBJECTIVE:  COGNITION:  Overall cognitive status: Within functional limits for tasks assessed     SENSATION: WFL   MUSCLE LENGTH: Hamstrings: Right mod tightness  POSTURE: rounded shoulders, forward head, flexed  trunk , and weight shift left  PALPATION: Warm, and tender around R knee   LOWER EXTREMITY ROM:  Active ROM Right eval Right 04/22/22 Right  05/04/22 Right AROM 05/11/22  Hip flexion      Hip extension      Hip abduction      Hip adduction      Hip internal rotation      Hip external rotation      Knee flexion 75 100 100 105  Knee extension -_0 Ankle dorsiflexion      Ankle plantarflexion      Ankle inversion      Ankle eversion       (Blank rows = not tested)  LOWER EXTREMITY MMT:  MMT Right eval Left eval  Hip flexion 3+   Hip extension    Hip abduction    Hip adduction    Hip internal rotation    Hip external rotation    Knee flexion 3+    Knee extension 4- w/pain   Ankle dorsiflexion 5   Ankle plantarflexion 5   Ankle inversion    Ankle eversion     (Blank rows = not tested)   FUNCTIONAL TESTS:  5 times sit to stand: 48.13s  Timed up and go (TUG): 29.87s     GAIT: Distance walked: in clinic distances Assistive device utilized: Environmental consultant - 2 wheeled Level of assistance: Modified independence Comments: slowed gait, antalgic pattern, decreased step and stance time on RLE   TODAY'S TREATMENT: 05/27/22 Circumferential measure left 40, right 43 cm Focused on her right leg in elevation a more lymphedema type massage for swelling, some patellar and scar mobilization Ankle pumps with the leg up and educated her on doing this at home Vaso medium pressure right knee 37 degrees  05/18/22 Bike partial revs x 6 minutes Nustep level 5 x 5 minutes TUG 20 seconds STM and passive motion to the right knee LAQ/SAQ sitting 2x10 each Standing TKE with red tband Standing right hip flexion with bent knee Standing hip abduction Vaso medium pressure right knee 37 degrees   05/13/22 Nustep level 5 x 6 minutes Bike partial revs 5 minutes STM to the knee , patella, PROM Vaso medium pressure in elevation right knee for pain and swelling PROM today to 110 degrees  felxion  05/11/22 Nustep level 5 x 6 minutes Bike partial revolutions x 5 minutes Gait outside with [facility] and some CGA/HHA due to poor pattern, tends to hike hip at toe off, needed verbal and tactile cues to correct this LAQ 3x10 cues to focus on TKE PROM of the right knee, STM to the right medial knee, scar and patellar mobilizations   05/06/22 Nustep level 4 x 6 minutes Bike partial revs x 5 minutes Gait outside back door to front door.with SPC and CGA Red tband HS curls 6" toe clears 4" steps up and down with two handrails   05/04/22 Nustep level 5 x 6 minutes Walking without device  but with HHA working on speed and pattern PROM focus on flexion, STM to the peripatellar area and the medial knee Bike partial revs 6" toe clears Kleenex box step overs Red tband HS curls Stairs 4" step over stp going up 2# LAQ 3x10 Leg press 20# 2x5 both legs Right leg only now weight 2x5 and then both legs no weight working on flexion   04/29/22 NuStep L5 x 6 minutes STM to R prepatellar quad, R prox calf, distal hamstring SAQ with 3# weight, 2 x 10 reps SLR, 2 x 10 with 2# resistance Bridge over physioball 2 x 10 reps Repeat with B hip IR 2 x 10 reps Seated AROM for L knee flexion Resisted gait with 20#, 3 reps forward, 3 reps backward Declined VASO   PATIENT EDUCATION:  Education details: POC and HEP Person educated: Patient and Child(ren) Education method: Explanation Education comprehension: verbalized understanding and returned demonstration   HOME EXERCISE PROGRAM: Calf raises in RW  Mini squats in RW  Seated LAQ  Supine SAQ   ASSESSMENT:  CLINICAL IMPRESSION: Patient reports she has had same pain and some increased swelling, saw MD x-rays and ultrasound were negative.  She remains tender to the medial knee, I feel like the welling is not abnormal, she is still very timid with walking and slow  ACTIVITY LIMITATIONS lifting, bending, sitting, standing, squatting, stairs,  toileting, hygiene/grooming, and locomotion level  REHAB POTENTIAL: Good  CLINICAL DECISION MAKING: Stable/uncomplicated  EVALUATION COMPLEXITY: Low   GOALS: Goals reviewed with patient? Yes  SHORT TERM GOALS: Target date: 05/12/22  Patient will be independent with initial HEP. Goal status:met  2.  Patient will report a decrease in R knee pain to 2/10 or better.  Baseline: 5/10 Goal status: ongoing   LONG TERM GOALS: Target date: 06/16/22  Patient will be independent with advanced/ongoing HEP to improve outcomes and carryover.  Goal status: ongoing 2.  Patient will demonstrate improved R knee AROM to >/= 5-120 deg to allow for normal gait, stair mechanics, and to be able to sit and stand from her toilet. Baseline: 20-0-75 Goal status: parially met  3.  Patient will demonstrate improved functional LE strength as demonstrated by 4/5 in all weak muscle groups. Goal status: ongoing  4.  Patient will be able to ambulate 600' with normal gait pattern without increased pain to access community.  Baseline: walking w/RW Goal status:ongoing  5. Patient will be able to ascend/descend stairs with 1 HR and reciprocal step pattern safely to access home and community.  Goal status: ongoing  6.  Patient will demonstrate TUG score to <15s w/o AD to decrease fall risk and return to walking in her community  Baseline: 29.87s Goal status:ongoing  7.  Patient will demonstrate 5xSTS score to < 20s to decrease fall risk and show functional strength improvement.  Baseline: 48.13s Goal status: INITIAL    PLAN: PT FREQUENCY: 2x/week  PT DURATION: 12 weeks  PLANNED INTERVENTIONS: Therapeutic exercises, Therapeutic activity, Neuromuscular re-education, Balance training, Gait training, Patient/Family education, Self Care, Joint mobilization, Stair training, Electrical stimulation, Cryotherapy, Moist heat, Vasopneumatic device, Ionotophoresis 64m/ml Dexamethasone, and Manual therapy  PLAN FOR  NEXT SESSION: see if the STM helped   MLum Babe PT 05/27/2022, 4:23 PM [place] [address]Phone: [phone]  Fax:  [phone]

## 2022-06-01 ENCOUNTER — Ambulatory Visit: Payer: Medicare Other | Admitting: Physical Therapy

## 2022-06-01 ENCOUNTER — Encounter: Payer: Self-pay | Admitting: Physical Therapy

## 2022-06-01 ENCOUNTER — Other Ambulatory Visit: Payer: Self-pay | Admitting: Hematology and Oncology

## 2022-06-01 DIAGNOSIS — M25561 Pain in right knee: Secondary | ICD-10-CM | POA: Diagnosis not present

## 2022-06-01 DIAGNOSIS — M25661 Stiffness of right knee, not elsewhere classified: Secondary | ICD-10-CM

## 2022-06-01 DIAGNOSIS — R600 Localized edema: Secondary | ICD-10-CM

## 2022-06-01 DIAGNOSIS — M6281 Muscle weakness (generalized): Secondary | ICD-10-CM

## 2022-06-01 DIAGNOSIS — R2689 Other abnormalities of gait and mobility: Secondary | ICD-10-CM

## 2022-06-01 NOTE — Therapy (Signed)
OUTPATIENT PHYSICAL THERAPY LOWER EXTREMITY TREATMENT    Patient Name: Gloria Mckinney MRN: [id number] DOB:[birth year], 76 y.o., female Today's Date: 06/01/2022   PT End of Session - 06/01/22 1630     Visit Number 13    Date for PT Re-Evaluation 06/16/22    Authorization Type Medicare    PT Start Time 1615    PT Stop Time 1700    PT Time Calculation (min) 45 min    Activity Tolerance Patient tolerated treatment well    Behavior During Therapy [facility] for tasks assessed/performed                Past Medical History:  Diagnosis Date   Cancer ([place])    GERD (gastroesophageal reflux disease)    History of radiation therapy 01/16/19- 02/13/19   left breast 15 fractions of 2.67 Gy for a total of 40.05 Gy. Left breast boost 5 fractions of 2 Gy for a total of 10 Gy   Hypertension    Mixed hyperlipidemia    OA (osteoarthritis)    knee right   Peripheral neuropathy    PVD (peripheral vascular disease) ([place])    Type 2 diabetes mellitus treated with insulin [facility])    endocrinologist--- dr Providence Crosby Mitnick   Past Surgical History:  Procedure Laterality Date   BREAST BIOPSY Bilateral 2000   benign   BREAST LUMPECTOMY WITH RADIOACTIVE SEED AND SENTINEL LYMPH NODE BIOPSY Left 08/15/2018   Procedure: LEFT BREAST LUMPECTOMY WITH RADIOACTIVE SEED AND LEFT AXILLARY DEEP SENTINEL LYMPH NODE BIOPSY INJECT BLUE DYE LEFT BREAST;  Surgeon: Fanny Skates, MD;  Location: [place];  Service: General;  Laterality: Left;   PORT-A-CATH REMOVAL Right 08/09/2019   Procedure: REMOVAL PORT-A-CATH;  Surgeon: Fanny Skates, MD;  Location: [place];  Service: General;  Laterality: Right;   PORTACATH PLACEMENT Right 08/15/2018   Procedure: INSERTION PORT-A-CATH WITH ULTRASOUND;  Surgeon: Fanny Skates, MD;  Location: [place];  Service: General;  Laterality: Right;   SHOULDER ARTHROSCOPY Right 06-16-2002   dr graves   SHOULDER OPEN ROTATOR CUFF REPAIR  Right 07-28-2000    dr graves   VAGINAL HYSTERECTOMY  1992   with [place]  2007   Patient Active Problem List   Diagnosis Date Noted   Port-A-Cath in place 09/19/2018   Malignant neoplasm of upper-inner quadrant of left breast in female, estrogen receptor positive ([place]) 07/27/2018   Dyspnea on exertion 10/21/2016   Type 2 diabetes mellitus without complication, without long-term current use of insulin ([place]) 10/21/2016   Dyslipidemia 10/21/2016    PCP: [name]  REFERRING PROVIDER: Dorna Leitz   REFERRING DIAG: R TKA   THERAPY DIAG:  Acute pain of right knee  Stiffness of right knee, not elsewhere classified  Muscle weakness (generalized)  Other abnormalities of gait and mobility  Localized edema  Rationale for Evaluation and Treatment Rehabilitation  ONSET DATE: 03/25/22  SUBJECTIVE:   SUBJECTIVE STATEMENT: Patient continues to report pain and swelling, reports cannot stand > 5-7 minutes due to pain in the knee  PERTINENT HISTORY: DM, HTN, PVD, shoulder sx, breast cancer sx   PAIN:  Are you having pain? Yes: NPRS scale: 4/10 Pain location: R knee Pain description: burning, achy  Aggravating factors: standing up, sitting, walking for a long time  Relieving factors: oxycodone, celebrix, ice  PRECAUTIONS: Fall  WEIGHT BEARING RESTRICTIONS No  FALLS:  Has patient fallen in last 6 months? No  LIVING ENVIRONMENT: Lives with:  lives with their family Lives in: House/apartment Stairs: Yes: Internal: 15 steps; on right going up Has following equipment at home: Gaffer - 2 wheeled  OCCUPATION: retired  PLOF: Independent  PATIENT GOALS no pain, stand up and walk    OBJECTIVE:  COGNITION:  Overall cognitive status: Within functional limits for tasks assessed     SENSATION: WFL   MUSCLE LENGTH: Hamstrings: Right mod tightness  POSTURE: rounded shoulders, forward head, flexed trunk , and weight shift  left  PALPATION: Warm, and tender around R knee   LOWER EXTREMITY ROM:  Active ROM Right eval Right 04/22/22 Right  05/04/22 Right AROM 05/11/22 Right AROM 06/01/22  Hip flexion       Hip extension       Hip abduction       Hip adduction       Hip internal rotation       Hip external rotation       Knee flexion 75 100 100 105 110  Knee extension -$RemoveBeforeD'20 10 12    '[id number]$ Ankle dorsiflexion       Ankle plantarflexion       Ankle inversion       Ankle eversion        (Blank rows = not tested)  LOWER EXTREMITY MMT:  MMT Right eval Left eval  Hip flexion 3+   Hip extension    Hip abduction    Hip adduction    Hip internal rotation    Hip external rotation    Knee flexion 3+    Knee extension 4- w/pain   Ankle dorsiflexion 5   Ankle plantarflexion 5   Ankle inversion    Ankle eversion     (Blank rows = not tested)   FUNCTIONAL TESTS:  5 times sit to stand: 48.13s  Timed up and go (TUG): 29.87s     GAIT: Distance walked: in clinic distances Assistive device utilized: Environmental consultant - 2 wheeled Level of assistance: Modified independence Comments: slowed gait, antalgic pattern, decreased step and stance time on RLE   TODAY'S TREATMENT: 06/01/22 Nustep level 3 x 6 minutes Bike partial revs 6 minutes LAQ 3x10 Gait with SPC working on natural bending and faster gait speed PROM to the right knee and STM to the scar and patella area Vaso medium pressure 34 degrees right knee in elevation  05/27/22 Circumferential measure left 40, right 43 cm Focused on her right leg in elevation a more lymphedema type massage for swelling, some patellar and scar mobilization Ankle pumps with the leg up and educated her on doing this at home Vaso medium pressure right knee 37 degrees  05/18/22 Bike partial revs x 6 minutes Nustep level 5 x 5 minutes TUG 20 seconds STM and passive motion to the right knee LAQ/SAQ sitting 2x10 each Standing TKE with red tband Standing right hip flexion with  bent knee Standing hip abduction Vaso medium pressure right knee 37 degrees   05/13/22 Nustep level 5 x 6 minutes Bike partial revs 5 minutes STM to the knee , patella, PROM Vaso medium pressure in elevation right knee for pain and swelling PROM today to 110 degrees felxion  05/11/22 Nustep level 5 x 6 minutes Bike partial revolutions x 5 minutes Gait outside with SPC and some CGA/HHA due to poor pattern, tends to hike hip at toe off, needed verbal and tactile cues to correct this LAQ 3x10 cues to focus on TKE PROM of the right knee, STM to  the right medial knee, scar and patellar mobilizations   05/06/22 Nustep level 4 x 6 minutes Bike partial revs x 5 minutes Gait outside back door to front door.with SPC and CGA Red tband HS curls 6" toe clears 4" steps up and down with two handrails   05/04/22 Nustep level 5 x 6 minutes Walking without device but with HHA working on speed and pattern PROM focus on flexion, STM to the peripatellar area and the medial knee Bike partial revs 6" toe clears Kleenex box step overs Red tband HS curls Stairs 4" step over stp going up 2# LAQ 3x10 Leg press 20# 2x5 both legs Right leg only now weight 2x5 and then both legs no weight working on flexion   04/29/22 NuStep L5 x 6 minutes STM to R prepatellar quad, R prox calf, distal hamstring SAQ with 3# weight, 2 x 10 reps SLR, 2 x 10 with 2# resistance Bridge over physioball 2 x 10 reps Repeat with B hip IR 2 x 10 reps Seated AROM for L knee flexion Resisted gait with 20#, 3 reps forward, 3 reps backward Declined VASO   PATIENT EDUCATION:  Education details: POC and HEP Person educated: Patient and Child(ren) Education method: Explanation Education comprehension: verbalized understanding and returned demonstration   HOME EXERCISE PROGRAM: Calf raises in RW  Mini squats in RW  Seated LAQ  Supine SAQ   ASSESSMENT:  CLINICAL IMPRESSION: Patient continues to have reports of pain  and swelling reports that she cannot stand greater than 5-7 minutes due to pain, cannot cook and struggles to make tea due to the pain.  She is walking better today, better gait pattern with bending the knee, AROM is improving ACTIVITY LIMITATIONS lifting, bending, sitting, standing, squatting, stairs, toileting, hygiene/grooming, and locomotion level  REHAB POTENTIAL: Good  CLINICAL DECISION MAKING: Stable/uncomplicated  EVALUATION COMPLEXITY: Low   GOALS: Goals reviewed with patient? Yes  SHORT TERM GOALS: Target date: 05/12/22  Patient will be independent with initial HEP. Goal status:met  2.  Patient will report a decrease in R knee pain to 2/10 or better.  Baseline: 5/10 Goal status: ongoing   LONG TERM GOALS: Target date: 06/16/22  Patient will be independent with advanced/ongoing HEP to improve outcomes and carryover.  Goal status: ongoing 2.  Patient will demonstrate improved R knee AROM to >/= 5-120 deg to allow for normal gait, stair mechanics, and to be able to sit and stand from her toilet. Baseline: 20-0-75 Goal status: parially met  3.  Patient will demonstrate improved functional LE strength as demonstrated by 4/5 in all weak muscle groups. Goal status: ongoing  4.  Patient will be able to ambulate 600' with normal gait pattern without increased pain to access community.  Baseline: walking w/RW Goal status:ongoing  5. Patient will be able to ascend/descend stairs with 1 HR and reciprocal step pattern safely to access home and community.  Goal status: ongoing  6.  Patient will demonstrate TUG score to <15s w/o AD to decrease fall risk and return to walking in her community  Baseline: 29.87s Goal status:ongoing  7.  Patient will demonstrate 5xSTS score to < 20s to decrease fall risk and show functional strength improvement.  Baseline: 48.13s Goal status: INITIAL    PLAN: PT FREQUENCY: 2x/week  PT DURATION: 12 weeks  PLANNED INTERVENTIONS: Therapeutic  exercises, Therapeutic activity, Neuromuscular re-education, Balance training, Gait training, Patient/Family education, Self Care, Joint mobilization, Stair training, Electrical stimulation, Cryotherapy, Moist heat, Vasopneumatic device, Ionotophoresis 4mg /ml Dexamethasone,  and Manual therapy  PLAN FOR NEXT SESSION: try to continue to progress as tolerated   Lum Babe, PT 06/01/2022, 4:30 PM [address] [place], [place], [address] Phone: [phone]   Fax:  [phone]

## 2022-06-03 ENCOUNTER — Ambulatory Visit: Payer: Medicare Other | Admitting: Physical Therapy

## 2022-06-03 ENCOUNTER — Encounter: Payer: Self-pay | Admitting: Physical Therapy

## 2022-06-03 DIAGNOSIS — M25561 Pain in right knee: Secondary | ICD-10-CM | POA: Diagnosis not present

## 2022-06-03 DIAGNOSIS — R2689 Other abnormalities of gait and mobility: Secondary | ICD-10-CM

## 2022-06-03 DIAGNOSIS — M6281 Muscle weakness (generalized): Secondary | ICD-10-CM

## 2022-06-03 DIAGNOSIS — M25661 Stiffness of right knee, not elsewhere classified: Secondary | ICD-10-CM

## 2022-06-03 DIAGNOSIS — R600 Localized edema: Secondary | ICD-10-CM

## 2022-06-03 NOTE — Therapy (Signed)
OUTPATIENT PHYSICAL THERAPY LOWER EXTREMITY TREATMENT    Patient Name: Gloria Mckinney MRN: [id number] DOB:[birth year], 76 y.o., female Today's Date: 06/03/2022   PT End of Session - 06/03/22 1629     Visit Number 14    Date for PT Re-Evaluation 06/16/22    Authorization Type Medicare    PT Start Time 1618    PT Stop Time 1703    PT Time Calculation (min) 45 min    Activity Tolerance Patient tolerated treatment well    Behavior During Therapy [facility] for tasks assessed/performed                Past Medical History:  Diagnosis Date   Cancer ([place])    GERD (gastroesophageal reflux disease)    History of radiation therapy 01/16/19- 02/13/19   left breast 15 fractions of 2.67 Gy for a total of 40.05 Gy. Left breast boost 5 fractions of 2 Gy for a total of 10 Gy   Hypertension    Mixed hyperlipidemia    OA (osteoarthritis)    knee right   Peripheral neuropathy    PVD (peripheral vascular disease) ([place])    Type 2 diabetes mellitus treated with insulin [facility])    endocrinologist--- dr Providence Crosby [place]   Past Surgical History:  Procedure Laterality Date   BREAST BIOPSY Bilateral 2000   benign   BREAST LUMPECTOMY WITH RADIOACTIVE SEED AND SENTINEL LYMPH NODE BIOPSY Left 08/15/2018   Procedure: LEFT BREAST LUMPECTOMY WITH RADIOACTIVE SEED AND LEFT AXILLARY DEEP SENTINEL LYMPH NODE BIOPSY INJECT BLUE DYE LEFT BREAST;  Surgeon: Fanny Skates, MD;  Location: [place];  Service: General;  Laterality: Left;   PORT-A-CATH REMOVAL Right 08/09/2019   Procedure: REMOVAL PORT-A-CATH;  Surgeon: Fanny Skates, MD;  Location: [place];  Service: General;  Laterality: Right;   PORTACATH PLACEMENT Right 08/15/2018   Procedure: INSERTION PORT-A-CATH WITH ULTRASOUND;  Surgeon: Fanny Skates, MD;  Location: [place];  Service: General;  Laterality: Right;   SHOULDER ARTHROSCOPY Right 06-16-2002   dr graves   SHOULDER OPEN ROTATOR CUFF REPAIR  Right 07-28-2000    dr graves   VAGINAL HYSTERECTOMY  1992   with [place]  2007   Patient Active Problem List   Diagnosis Date Noted   Port-A-Cath in place 09/19/2018   Malignant neoplasm of upper-inner quadrant of left breast in female, estrogen receptor positive ([place]) 07/27/2018   Dyspnea on exertion 10/21/2016   Type 2 diabetes mellitus without complication, without long-term current use of insulin ([place]) 10/21/2016   Dyslipidemia 10/21/2016    PCP: [name]  REFERRING PROVIDER: Dorna Leitz   REFERRING DIAG: R TKA   THERAPY DIAG:  Acute pain of right knee  Stiffness of right knee, not elsewhere classified  Muscle weakness (generalized)  Other abnormalities of gait and mobility  Localized edema  Rationale for Evaluation and Treatment Rehabilitation  ONSET DATE: 03/25/22  SUBJECTIVE:   SUBJECTIVE STATEMENT: Still painful, has a new medication but has not taken it yet, will start in the AM  PERTINENT HISTORY: DM, HTN, PVD, shoulder sx, breast cancer sx   PAIN:  Are you having pain? Yes: NPRS scale: 4/10 Pain location: R knee Pain description: burning, achy  Aggravating factors: standing up, sitting, walking for a long time  Relieving factors: oxycodone, celebrix, ice  PRECAUTIONS: Fall  WEIGHT BEARING RESTRICTIONS No  FALLS:  Has patient fallen in last 6 months? No  LIVING ENVIRONMENT: Lives with: lives with  their family Lives in: House/apartment Stairs: Yes: Internal: 15 steps; on right going up Has following equipment at home: Quad cane small base and Walker - 2 wheeled  OCCUPATION: retired  PLOF: Independent  PATIENT GOALS no pain, stand up and walk    OBJECTIVE:  COGNITION:  Overall cognitive status: Within functional limits for tasks assessed     SENSATION: WFL   MUSCLE LENGTH: Hamstrings: Right mod tightness  POSTURE: rounded shoulders, forward head, flexed trunk , and weight shift left  PALPATION: Warm, and  tender around R knee   LOWER EXTREMITY ROM:  Active ROM Right eval Right 04/22/22 Right  05/04/22 Right AROM 05/11/22 Right AROM 06/01/22  Hip flexion       Hip extension       Hip abduction       Hip adduction       Hip internal rotation       Hip external rotation       Knee flexion 75 100 100 105 110  Knee extension -_0 Ankle dorsiflexion       Ankle plantarflexion       Ankle inversion       Ankle eversion        (Blank rows = not tested)  LOWER EXTREMITY MMT:  MMT Right eval Left eval  Hip flexion 3+   Hip extension    Hip abduction    Hip adduction    Hip internal rotation    Hip external rotation    Knee flexion 3+    Knee extension 4- w/pain   Ankle dorsiflexion 5   Ankle plantarflexion 5   Ankle inversion    Ankle eversion     (Blank rows = not tested)   FUNCTIONAL TESTS:  5 times sit to stand: 48.13s  Timed up and go (TUG): 29.87s     GAIT: Distance walked: in clinic distances Assistive device utilized: Environmental consultant - 2 wheeled Level of assistance: Modified independence Comments: slowed gait, antalgic pattern, decreased step and stance time on RLE   TODAY'S TREATMENT: 06/03/22 Bike  partial revs 6 minutes Nustep level 4 x 4 minutes PROM to the right knee flexiona nd extension STM tot he patella and the tissue around the knee LAQ 2# 3x10 Red tband flexion knee Red tband ankle exercises Talked about elevation and ankle pumps at home Vaso medium pressure in elevation Gait with SPC and without SPC x 100 feet  06/01/22 Nustep level 3 x 6 minutes Bike partial revs 6 minutes LAQ 3x10 Gait with SPC working on natural bending and faster gait speed PROM to the right knee and STM to the scar and patella area Vaso medium pressure 34 degrees right knee in elevation  05/27/22 Circumferential measure left 40, right 43 cm Focused on her right leg in elevation a more lymphedema type massage for swelling, some patellar and scar mobilization Ankle  pumps with the leg up and educated her on doing this at home Vaso medium pressure right knee 37 degrees  05/18/22 Bike partial revs x 6 minutes Nustep level 5 x 5 minutes TUG 20 seconds STM and passive motion to the right knee LAQ/SAQ sitting 2x10 each Standing TKE with red tband Standing right hip flexion with bent knee Standing hip abduction Vaso medium pressure right knee 37 degrees   05/13/22 Nustep level 5 x 6 minutes Bike partial revs 5 minutes STM to the knee , patella, PROM Vaso medium pressure in elevation right knee for  pain and swelling PROM today to 110 degrees felxion  05/11/22 Nustep level 5 x 6 minutes Bike partial revolutions x 5 minutes Gait outside with [facility] and some CGA/HHA due to poor pattern, tends to hike hip at toe off, needed verbal and tactile cues to correct this LAQ 3x10 cues to focus on TKE PROM of the right knee, STM to the right medial knee, scar and patellar mobilizations   05/06/22 Nustep level 4 x 6 minutes Bike partial revs x 5 minutes Gait outside back door to front door.with SPC and CGA Red tband HS curls 6" toe clears 4" steps up and down with two handrails   PATIENT EDUCATION:  Education details: POC and HEP Person educated: Patient and Child(ren) Education method: Explanation Education comprehension: verbalized understanding and returned demonstration   HOME EXERCISE PROGRAM: Calf raises in RW  Mini squats in RW  Seated LAQ  Supine SAQ   ASSESSMENT:  CLINICAL IMPRESSION: Patient walking better today, more natural bend and less antalgic gait she also was faster.  Still very swollen and she and her daughter are concerned about this, will start a new medication  ACTIVITY LIMITATIONS lifting, bending, sitting, standing, squatting, stairs, toileting, hygiene/grooming, and locomotion level  REHAB POTENTIAL: Good  CLINICAL DECISION MAKING: Stable/uncomplicated  EVALUATION COMPLEXITY: Low   GOALS: Goals reviewed with  patient? Yes  SHORT TERM GOALS: Target date: 05/12/22  Patient will be independent with initial HEP. Goal status:met  2.  Patient will report a decrease in R knee pain to 2/10 or better.  Baseline: 5/10 Goal status: ongoing   LONG TERM GOALS: Target date: 06/16/22  Patient will be independent with advanced/ongoing HEP to improve outcomes and carryover.  Goal status: ongoing 2.  Patient will demonstrate improved R knee AROM to >/= 5-120 deg to allow for normal gait, stair mechanics, and to be able to sit and stand from her toilet. Baseline: 20-0-75 Goal status: parially met  3.  Patient will demonstrate improved functional LE strength as demonstrated by 4/5 in all weak muscle groups. Goal status: ongoing  4.  Patient will be able to ambulate 600' with normal gait pattern without increased pain to access community.  Baseline: walking w/RW Goal status:ongoing  5. Patient will be able to ascend/descend stairs with 1 HR and reciprocal step pattern safely to access home and community.  Goal status: ongoing  6.  Patient will demonstrate TUG score to <15s w/o AD to decrease fall risk and return to walking in her community  Baseline: 29.87s Goal status:ongoing  7.  Patient will demonstrate 5xSTS score to < 20s to decrease fall risk and show functional strength improvement.  Baseline: 48.13s Goal status: INITIAL    PLAN: PT FREQUENCY: 2x/week  PT DURATION: 12 weeks  PLANNED INTERVENTIONS: Therapeutic exercises, Therapeutic activity, Neuromuscular re-education, Balance training, Gait training, Patient/Family education, Self Care, Joint mobilization, Stair training, Electrical stimulation, Cryotherapy, Moist heat, Vasopneumatic device, Ionotophoresis 87m/ml Dexamethasone, and Manual therapy  PLAN FOR NEXT SESSION: try to continue to progress as tolerated   MLum Babe PT 06/03/2022, 4:30 PM [place] [id number]: [phone]  Fax:  [phone]

## 2022-06-09 ENCOUNTER — Ambulatory Visit: Payer: Medicare Other | Admitting: Physical Therapy

## 2022-06-09 ENCOUNTER — Encounter: Payer: Self-pay | Admitting: Physical Therapy

## 2022-06-09 ENCOUNTER — Encounter: Admit: 2022-06-09 | Payer: MEDICARE | Attending: Internal Medicine | Primary: Internal Medicine

## 2022-06-09 DIAGNOSIS — M25561 Pain in right knee: Secondary | ICD-10-CM | POA: Diagnosis not present

## 2022-06-09 DIAGNOSIS — R600 Localized edema: Secondary | ICD-10-CM

## 2022-06-09 DIAGNOSIS — M6281 Muscle weakness (generalized): Secondary | ICD-10-CM

## 2022-06-09 DIAGNOSIS — R2689 Other abnormalities of gait and mobility: Secondary | ICD-10-CM

## 2022-06-09 DIAGNOSIS — M25661 Stiffness of right knee, not elsewhere classified: Secondary | ICD-10-CM

## 2022-06-09 NOTE — Therapy (Signed)
OUTPATIENT PHYSICAL THERAPY LOWER EXTREMITY TREATMENT    Patient Name: Gloria Mckinney MRN: [id number] DOB:1946-02-27, 77 y.o., female Today's Date: 06/09/2022   PT End of Session - 06/09/22 1750     Visit Number 15    Date for PT Re-Evaluation 06/16/22    Authorization Type Medicare    PT Start Time 1615    PT Stop Time 1700    PT Time Calculation (min) 45 min    Activity Tolerance Patient tolerated treatment well    Behavior During Therapy [facility] for tasks assessed/performed                Past Medical History:  Diagnosis Date   Cancer ([place])    GERD (gastroesophageal reflux disease)    History of radiation therapy 01/16/19- 02/13/19   left breast 15 fractions of 2.67 Gy for a total of 40.05 Gy. Left breast boost 5 fractions of 2 Gy for a total of 10 Gy   Hypertension    Mixed hyperlipidemia    OA (osteoarthritis)    knee right   Peripheral neuropathy    PVD (peripheral vascular disease) ([place])    Type 2 diabetes mellitus treated with insulin [facility])    endocrinologist--- dr Providence Crosby [place]ildt   Past Surgical History:  Procedure Laterality Date   BREAST BIOPSY Bilateral 2000   benign   BREAST LUMPECTOMY WITH RADIOACTIVE SEED AND SENTINEL LYMPH NODE BIOPSY Left 08/15/2018   Procedure: LEFT BREAST LUMPECTOMY WITH RADIOACTIVE SEED AND LEFT AXILLARY DEEP SENTINEL LYMPH NODE BIOPSY INJECT BLUE DYE LEFT BREAST;  Surgeon: Fanny Skates, MD;  Location: [place];  Service: General;  Laterality: Left;   PORT-A-CATH REMOVAL Right 08/09/2019   Procedure: REMOVAL PORT-A-CATH;  Surgeon: Fanny Skates, MD;  Location: [place];  Service: General;  Laterality: Right;   PORTACATH PLACEMENT Right 08/15/2018   Procedure: INSERTION PORT-A-CATH WITH ULTRASOUND;  Surgeon: Fanny Skates, MD;  Location: [place];  Service: General;  Laterality: Right;   SHOULDER ARTHROSCOPY Right 06-16-2002   dr graves   SHOULDER OPEN ROTATOR CUFF REPAIR  Right 07-28-2000    dr graves   VAGINAL HYSTERECTOMY  1992   with [place]  2007   Patient Active Problem List   Diagnosis Date Noted   Port-A-Cath in place 09/19/2018   Malignant neoplasm of upper-inner quadrant of left breast in female, estrogen receptor positive ([place]) 07/27/2018   Dyspnea on exertion 10/21/2016   Type 2 diabetes mellitus without complication, without long-term current use of insulin ([place]) 10/21/2016   Dyslipidemia 10/21/2016    PCP: [name]  REFERRING PROVIDER: Dorna Leitz   REFERRING DIAG: R TKA   THERAPY DIAG:  Acute pain of right knee  Stiffness of right knee, not elsewhere classified  Muscle weakness (generalized)  Other abnormalities of gait and mobility  Localized edema  Rationale for Evaluation and Treatment Rehabilitation  ONSET DATE: 03/25/22  SUBJECTIVE:   SUBJECTIVE STATEMENT: Patient still c/o pain and swelling  PERTINENT HISTORY: DM, HTN, PVD, shoulder sx, breast cancer sx   PAIN:  Are you having pain? Yes: NPRS scale: 4/10 Pain location: R knee Pain description: burning, achy  Aggravating factors: standing up, sitting, walking for a long time  Relieving factors: oxycodone, celebrix, ice  PRECAUTIONS: Fall  WEIGHT BEARING RESTRICTIONS No  FALLS:  Has patient fallen in last 6 months? No  LIVING ENVIRONMENT: Lives with: lives with their family Lives in: House/apartment Stairs: Yes: Internal: 15 steps; on  right going up Has following equipment at home: Gaffer - 2 wheeled  OCCUPATION: retired  PLOF: Independent  PATIENT GOALS no pain, stand up and walk    OBJECTIVE:  COGNITION:  Overall cognitive status: Within functional limits for tasks assessed     SENSATION: WFL   MUSCLE LENGTH: Hamstrings: Right mod tightness  POSTURE: rounded shoulders, forward head, flexed trunk , and weight shift left  PALPATION: Warm, and tender around R knee   LOWER EXTREMITY  ROM:  Active ROM Right eval Right 04/22/22 Right  05/04/22 Right AROM 05/11/22 Right AROM 06/01/22  Hip flexion       Hip extension       Hip abduction       Hip adduction       Hip internal rotation       Hip external rotation       Knee flexion 75 100 100 105 110  Knee extension -_0 Ankle dorsiflexion       Ankle plantarflexion       Ankle inversion       Ankle eversion        (Blank rows = not tested)  LOWER EXTREMITY MMT:  MMT Right eval Left eval  Hip flexion 3+   Hip extension    Hip abduction    Hip adduction    Hip internal rotation    Hip external rotation    Knee flexion 3+    Knee extension 4- w/pain   Ankle dorsiflexion 5   Ankle plantarflexion 5   Ankle inversion    Ankle eversion     (Blank rows = not tested)   FUNCTIONAL TESTS:  5 times sit to stand: 48.13s  Timed up and go (TUG): 29.87s     GAIT: Distance walked: in clinic distances Assistive device utilized: Environmental consultant - 2 wheeled Level of assistance: Modified independence Comments: slowed gait, antalgic pattern, decreased step and stance time on RLE   TODAY'S TREATMENT: 06/09/22 Nustep level5 x 6 mintues Bike got her to do full revs with some assist and trunk lean Weight shifts Standing on airex weight shifts, marching, cone toe touches with SPC Stairs step over step on the 4" Gait with cues to bend Practice tub transfers, problem solved low toilet and practiced transfer STM and PROM to the right knee  06/03/22 Bike  partial revs 6 minutes Nustep level 4 x 4 minutes PROM to the right knee flexiona nd extension STM tot he patella and the tissue around the knee LAQ 2# 3x10 Red tband flexion knee Red tband ankle exercises Talked about elevation and ankle pumps at home Vaso medium pressure in elevation Gait with SPC and without SPC x 100 feet  06/01/22 Nustep level 3 x 6 minutes Bike partial revs 6 minutes LAQ 3x10 Gait with SPC working on natural bending and faster gait  speed PROM to the right knee and STM to the scar and patella area Vaso medium pressure 34 degrees right knee in elevation  05/27/22 Circumferential measure left 40, right 43 cm Focused on her right leg in elevation a more lymphedema type massage for swelling, some patellar and scar mobilization Ankle pumps with the leg up and educated her on doing this at home Vaso medium pressure right knee 37 degrees  05/18/22 Bike partial revs x 6 minutes Nustep level 5 x 5 minutes TUG 20 seconds STM and passive motion to the right knee LAQ/SAQ sitting 2x10 each  Standing TKE with red tband Standing right hip flexion with bent knee Standing hip abduction Vaso medium pressure right knee 37 degrees   05/13/22 Nustep level 5 x 6 minutes Bike partial revs 5 minutes STM to the knee , patella, PROM Vaso medium pressure in elevation right knee for pain and swelling PROM today to 110 degrees felxion  PATIENT EDUCATION:  Education details: POC and HEP Person educated: Patient and Child(ren) Education method: Explanation Education comprehension: verbalized understanding and returned demonstration   HOME EXERCISE PROGRAM: Calf raises in RW  Mini squats in RW  Seated LAQ  Supine SAQ   ASSESSMENT:  CLINICAL IMPRESSION: Patient reverts to the stiff legged gait, she tends to favor that side but with some cues she does well without issue.  Swelling was decreased 1cm today compared to 2 weeks ago, she was able to do full revs on the bike with some assist and trunk lean, had good quesitons about transfers and safety in the bathroom  ACTIVITY LIMITATIONS lifting, bending, sitting, standing, squatting, stairs, toileting, hygiene/grooming, and locomotion level  REHAB POTENTIAL: Good  CLINICAL DECISION MAKING: Stable/uncomplicated  EVALUATION COMPLEXITY: Low   GOALS: Goals reviewed with patient? Yes  SHORT TERM GOALS: Target date: 05/12/22  Patient will be independent with initial HEP. Goal  status:met  2.  Patient will report a decrease in R knee pain to 2/10 or better.  Baseline: 5/10 Goal status: ongoing   LONG TERM GOALS: Target date: 06/16/22  Patient will be independent with advanced/ongoing HEP to improve outcomes and carryover.  Goal status: ongoing 2.  Patient will demonstrate improved R knee AROM to >/= 5-120 deg to allow for normal gait, stair mechanics, and to be able to sit and stand from her toilet. Baseline: 20-0-75 Goal status: parially met  3.  Patient will demonstrate improved functional LE strength as demonstrated by 4/5 in all weak muscle groups. Goal status: ongoing  4.  Patient will be able to ambulate 600' with normal gait pattern without increased pain to access community.  Baseline: walking w/RW Goal status:ongoing  5. Patient will be able to ascend/descend stairs with 1 HR and reciprocal step pattern safely to access home and community.  Goal status: ongoing  6.  Patient will demonstrate TUG score to <15s w/o AD to decrease fall risk and return to walking in her community  Baseline: 29.87s Goal status:ongoing  7.  Patient will demonstrate 5xSTS score to < 20s to decrease fall risk and show functional strength improvement.  Baseline: 48.13s Goal status: INITIAL    PLAN: PT FREQUENCY: 2x/week  PT DURATION: 12 weeks  PLANNED INTERVENTIONS: Therapeutic exercises, Therapeutic activity, Neuromuscular re-education, Balance training, Gait training, Patient/Family education, Self Care, Joint mobilization, Stair training, Electrical stimulation, Cryotherapy, Moist heat, Vasopneumatic device, Ionotophoresis 2m/ml Dexamethasone, and Manual therapy  PLAN FOR NEXT SESSION: try to continue to progress as tolerated   MLum Babe PT 06/09/2022, 5:52 PM [place] [address]Phone: [phone]  Fax:  [phone]

## 2022-06-11 ENCOUNTER — Ambulatory Visit: Payer: Medicare Other | Admitting: Physical Therapy

## 2022-06-16 ENCOUNTER — Ambulatory Visit: Payer: Medicare Other | Admitting: Physical Therapy

## 2022-06-17 ENCOUNTER — Ambulatory Visit: Payer: Medicare Other | Attending: Orthopedic Surgery | Admitting: Physical Therapy

## 2022-06-17 ENCOUNTER — Encounter: Payer: Self-pay | Admitting: Physical Therapy

## 2022-06-17 DIAGNOSIS — M25661 Stiffness of right knee, not elsewhere classified: Secondary | ICD-10-CM | POA: Insufficient documentation

## 2022-06-17 DIAGNOSIS — M6281 Muscle weakness (generalized): Secondary | ICD-10-CM | POA: Diagnosis present

## 2022-06-17 DIAGNOSIS — R600 Localized edema: Secondary | ICD-10-CM | POA: Diagnosis present

## 2022-06-17 DIAGNOSIS — R2689 Other abnormalities of gait and mobility: Secondary | ICD-10-CM | POA: Insufficient documentation

## 2022-06-17 DIAGNOSIS — M25561 Pain in right knee: Secondary | ICD-10-CM | POA: Insufficient documentation

## 2022-06-17 NOTE — Therapy (Signed)
OUTPATIENT PHYSICAL THERAPY LOWER EXTREMITY TREATMENT    Patient Name: Gloria Mckinney MRN: [id number] DOB:January 20, 1946, 76 y.o., female Today's Date: 06/17/2022   PT End of Session - 06/17/22 1629     Visit Number 16    Date for PT Re-Evaluation 07/17/22    Authorization Type Medicare    PT Start Time 1615    PT Stop Time 1700    PT Time Calculation (min) 45 min    Activity Tolerance Patient tolerated treatment well    Behavior During Therapy [facility] for tasks assessed/performed                Past Medical History:  Diagnosis Date   Cancer ([place])    GERD (gastroesophageal reflux disease)    History of radiation therapy 01/16/19- 02/13/19   left breast 15 fractions of 2.67 Gy for a total of 40.05 Gy. Left breast boost 5 fractions of 2 Gy for a total of 10 Gy   Hypertension    Mixed hyperlipidemia    OA (osteoarthritis)    knee right   Peripheral neuropathy    PVD (peripheral vascular disease) ([place])    Type 2 diabetes mellitus treated with insulin [facility])    endocrinologist--- dr Providence Crosby [place]   Past Surgical History:  Procedure Laterality Date   BREAST BIOPSY Bilateral 2000   benign   BREAST LUMPECTOMY WITH RADIOACTIVE SEED AND SENTINEL LYMPH NODE BIOPSY Left 08/15/2018   Procedure: LEFT BREAST LUMPECTOMY WITH RADIOACTIVE SEED AND LEFT AXILLARY DEEP SENTINEL LYMPH NODE BIOPSY INJECT BLUE DYE LEFT BREAST;  Surgeon: Fanny Skates, MD;  Location: [place];  Service: General;  Laterality: Left;   PORT-A-CATH REMOVAL Right 08/09/2019   Procedure: REMOVAL PORT-A-CATH;  Surgeon: Fanny Skates, MD;  Location: [place];  Service: General;  Laterality: Right;   PORTACATH PLACEMENT Right 08/15/2018   Procedure: INSERTION PORT-A-CATH WITH ULTRASOUND;  Surgeon: Fanny Skates, MD;  Location: [place];  Service: General;  Laterality: Right;   SHOULDER ARTHROSCOPY Right 06-16-2002   dr graves   SHOULDER OPEN ROTATOR CUFF REPAIR  Right 07-28-2000    dr graves   VAGINAL HYSTERECTOMY  1992   with [place]  2007   Patient Active Problem List   Diagnosis Date Noted   Port-A-Cath in place 09/19/2018   Malignant neoplasm of upper-inner quadrant of left breast in female, estrogen receptor positive ([place]) 07/27/2018   Dyspnea on exertion 10/21/2016   Type 2 diabetes mellitus without complication, without long-term current use of insulin ([place]) 10/21/2016   Dyslipidemia 10/21/2016    PCP: [name]  REFERRING PROVIDER: Dorna Leitz   REFERRING DIAG: R TKA   THERAPY DIAG:  Acute pain of right knee  Stiffness of right knee, not elsewhere classified  Muscle weakness (generalized)  Other abnormalities of gait and mobility  Localized edema  Rationale for Evaluation and Treatment Rehabilitation  ONSET DATE: 03/25/22  SUBJECTIVE:   SUBJECTIVE STATEMENT: Patient continues to have pain in the medial knee reports that she cannot stand > 10 minutes due to the pain, she still has c/o swelling  PERTINENT HISTORY: DM, HTN, PVD, shoulder sx, breast cancer sx   PAIN:  Are you having pain? Yes: NPRS scale: 2/10 Pain location: R knee Pain description: burning, achy  Aggravating factors: standing up, sitting, walking for a long time pain is up to 8/10 with standing >7 minutes  Relieving factors: oxycodone, celebrix, ice  PRECAUTIONS: Fall  WEIGHT BEARING RESTRICTIONS No  FALLS:  Has patient fallen in last 6 months? No  LIVING ENVIRONMENT: Lives with: lives with their family Lives in: House/apartment Stairs: Yes: Internal: 15 steps; on right going up Has following equipment at home: Gaffer - 2 wheeled  OCCUPATION: retired  PLOF: Independent  PATIENT GOALS no pain, stand up and walk    OBJECTIVE:  COGNITION:  Overall cognitive status: Within functional limits for tasks assessed     SENSATION: WFL   MUSCLE LENGTH: Hamstrings: Right mod tightness  POSTURE:  rounded shoulders, forward head, flexed trunk , and weight shift left  PALPATION: Warm, and tender around R knee   LOWER EXTREMITY ROM:  Active ROM Right eval Right 04/22/22 Right  05/04/22 Right AROM 05/11/22 Right AROM 06/01/22 Right AROM 06/17/22  Hip flexion        Hip extension        Hip abduction        Hip adduction        Hip internal rotation        Hip external rotation        Knee flexion 75 100 100 105 110 110  Knee extension -_0 Ankle dorsiflexion        Ankle plantarflexion        Ankle inversion        Ankle eversion         (Blank rows = not tested)  LOWER EXTREMITY MMT:  MMT Right eval right 06/17/22  Hip flexion 3+   Hip extension    Hip abduction    Hip adduction    Hip internal rotation    Hip external rotation    Knee flexion 3+  4/5  Knee extension 4- w/pain 4/5  Ankle dorsiflexion 5   Ankle plantarflexion 5   Ankle inversion    Ankle eversion     (Blank rows = not tested)   FUNCTIONAL TESTS:  5 times sit to stand: 48.13s  Timed up and go (TUG): 29.87s     GAIT: Distance walked: in clinic distances Assistive device utilized: Environmental consultant - 2 wheeled Level of assistance: Modified independence Comments: slowed gait, antalgic pattern, decreased step and stance time on RLE   TODAY'S TREATMENT: 06/17/22 PROM, scar and joint mobilization Nustep level 4 x 6 minutes Bike partial to full revolutions x 6 minutes HS curls 15# both legs LAQ with focus on TKE Gait without device working on natural bend and step length, outside small lap around [employer], better bend   06/09/22 Nustep level5 x 6 mintues Bike got her to do full revs with some assist and trunk lean Weight shifts Standing on airex weight shifts, marching, cone toe touches with SPC Stairs step over step on the 4" Gait with cues to bend Practice tub transfers, problem solved low toilet and practiced transfer STM and PROM to the right knee  06/03/22 Bike  partial  revs 6 minutes Nustep level 4 x 4 minutes PROM to the right knee flexiona nd extension STM tot he patella and the tissue around the knee LAQ 2# 3x10 Red tband flexion knee Red tband ankle exercises Talked about elevation and ankle pumps at home Vaso medium pressure in elevation Gait with SPC and without SPC x 100 feet  06/01/22 Nustep level 3 x 6 minutes Bike partial revs 6 minutes LAQ 3x10 Gait with SPC working on natural bending and faster gait speed PROM to the right knee and STM  to the scar and patella area Vaso medium pressure 34 degrees right knee in elevation  05/27/22 Circumferential measure left 40, right 43 cm Focused on her right leg in elevation a more lymphedema type massage for swelling, some patellar and scar mobilization Ankle pumps with the leg up and educated her on doing this at home Vaso medium pressure right knee 37 degrees  PATIENT EDUCATION:  Education details: POC and HEP Person educated: Patient and Child(ren) Education method: Explanation Education comprehension: verbalized understanding and returned demonstration   HOME EXERCISE PROGRAM: Calf raises in RW  Mini squats in RW  Seated LAQ  Supine SAQ   ASSESSMENT:  CLINICAL IMPRESSION: Patient is doing much better with her walking, no longer using a cane, a better natural bend and step length, her swelling is still 3 cm larger from the left.  ROM is improving and becoming easier, just stiff.  Biggest issue is pain medial knee with standing > 7 minutes. TUG is 16 seconds  ACTIVITY LIMITATIONS lifting, bending, sitting, standing, squatting, stairs, toileting, hygiene/grooming, and locomotion level  REHAB POTENTIAL: Good  CLINICAL DECISION MAKING: Stable/uncomplicated  EVALUATION COMPLEXITY: Low   GOALS: Goals reviewed with patient? Yes  SHORT TERM GOALS: Target date: 05/12/22  Patient will be independent with initial HEP. Goal status:met  2.  Patient will report a decrease in R knee  pain to 2/10 or better.  Baseline: 5/10 Goal status: ongoing   LONG TERM GOALS: Target date: 06/16/22  Patient will be independent with advanced/ongoing HEP to improve outcomes and carryover.  Goal status: ongoing 2.  Patient will demonstrate improved R knee AROM to >/= 5-120 deg to allow for normal gait, stair mechanics, and to be able to sit and stand from her toilet. Baseline: 20-0-75 Goal status: parially met  3.  Patient will demonstrate improved functional LE strength as demonstrated by 4/5 in all weak muscle groups. Goal status: ongoing  4.  Patient will be able to ambulate 600' with normal gait pattern without increased pain to access community.  Baseline: walking w/RW Goal status:ongoing  5. Patient will be able to ascend/descend stairs with 1 HR and reciprocal step pattern safely to access home and community.  Goal status: ongoing  6.  Patient will demonstrate TUG score to <15s w/o AD to decrease fall risk and return to walking in her community  Baseline: 16 seconds 06/17/22 Goal status:ongoing  7.  Patient will demonstrate 5xSTS score to < 20s to decrease fall risk and show functional strength improvement.  Baseline: 48.13s Goal status: INITIAL    PLAN: PT FREQUENCY: 2x/week  PT DURATION: 12 weeks  PLANNED INTERVENTIONS: Therapeutic exercises, Therapeutic activity, Neuromuscular re-education, Balance training, Gait training, Patient/Family education, Self Care, Joint mobilization, Stair training, Electrical stimulation, Cryotherapy, Moist heat, Vasopneumatic device, Ionotophoresis 61m/ml Dexamethasone, and Manual therapy  PLAN FOR NEXT SESSION: please advise if you would like [place] continue   MLum Babe PT 06/17/2022, 4:30 PM [place] [address]Phone: [phone]  Fax:  [phone]

## 2022-06-17 NOTE — Addendum Note (Signed)
Addended by: Sumner Boast on: 06/17/2022 05:46 PM   Modules accepted: Orders

## 2022-06-18 ENCOUNTER — Ambulatory Visit: Admit: 2022-06-18 | Payer: MEDICARE | Attending: Internal Medicine | Primary: Internal Medicine

## 2022-06-18 ENCOUNTER — Encounter: Admit: 2022-06-18 | Payer: PRIVATE HEALTH INSURANCE | Attending: Internal Medicine | Primary: Internal Medicine

## 2022-06-19 ENCOUNTER — Inpatient Hospital Stay: Admit: 2022-06-19 | Discharge: 2022-06-19 | Payer: MEDICARE | Primary: Internal Medicine

## 2022-06-19 DIAGNOSIS — E785 Hyperlipidemia, unspecified: Secondary | ICD-10-CM

## 2022-06-19 DIAGNOSIS — I10 Essential (primary) hypertension: Secondary | ICD-10-CM

## 2022-06-19 DIAGNOSIS — D649 Anemia, unspecified: Secondary | ICD-10-CM

## 2022-06-19 DIAGNOSIS — E1169 Type 2 diabetes mellitus with other specified complication: Secondary | ICD-10-CM

## 2022-06-19 LAB — CBC WITH AUTO DIFFERENTIAL
BKR A/G RATIO: 0.51 x 1000/??L (ref 0.00–1.00)
BKR ASPARTATE AMINOTRANSFERASE (AST): 5.7 x1000/??L — ABNORMAL HIGH (ref 4.0–11.0)
BKR WAM ABSOLUTE IMMATURE GRANULOCYTES.: 0.02 x 1000/??L (ref 0.00–0.30)
BKR WAM ABSOLUTE LYMPHOCYTE COUNT.: 1.41 x 1000/ÂµL (ref 0.60–3.70)
BKR WAM ABSOLUTE NRBC (2 DEC): 0 x 1000/??L (ref 0.00–1.00)
BKR WAM BASOPHIL ABSOLUTE COUNT.: 0.04 x 1000/??L — ABNORMAL LOW (ref 0.00–1.00)
BKR WAM BASOPHILS: 0.7 % (ref 0.0–1.4)
BKR WAM EOSINOPHIL ABSOLUTE COUNT.: 0.19 x 1000/ÂµL (ref 0.00–1.00)
BKR WAM EOSINOPHILS: 3.3 % (ref 0.0–5.0)
BKR WAM IMMATURE GRANULOCYTES: 0.4 % (ref 0.0–1.0)
BKR WAM LYMPHOCYTES: 24.8 % (ref 17.0–50.0)
BKR WAM MCH (PG): 28.2 pg (ref 27.0–33.0)
BKR WAM MCHC: 30.4 g/dL — ABNORMAL LOW (ref 0–5)
BKR WAM MCV: 92.9 fL (ref 80.0–100.0)
BKR WAM MONOCYTE ABSOLUTE COUNT.: 0.51 x 1000/ÂµL (ref 0.00–1.00)
BKR WAM MONOCYTES: 9 % (ref 4.0–12.0)
BKR WAM MPV: 12.2 fL — ABNORMAL HIGH (ref 8.0–12.0)
BKR WAM NEUTROPHILS: 61.8 % (ref 39.0–72.0)
BKR WAM NUCLEATED RED BLOOD CELLS: 0 % (ref 0.0–1.0)
BKR WAM PLATELETS: 294 x1000/ÂµL (ref 150–420)
BKR WAM RDW-CV: 13.5 % (ref 11.0–15.0)
BKR WAM RED BLOOD CELL COUNT.: 3.79 M/ÂµL — ABNORMAL LOW (ref 4.00–6.00)
BKR WAM WHITE BLOOD CELL COUNT: 5.7 x1000/ÂµL (ref 4.0–11.0)

## 2022-06-19 LAB — HEMOGLOBIN A1C
BKR ESTIMATED AVERAGE GLUCOSE: 157 mg/dL
BKR HEMOGLOBIN A1C: 7.1 % — ABNORMAL HIGH (ref 4.0–5.6)

## 2022-06-19 LAB — URINALYSIS-MACROSCOPIC W/REFLEX MICROSCOPIC
BKR BILIRUBIN, UA: NEGATIVE
BKR BLOOD, UA: NEGATIVE
BKR GLUCOSE, UA: NEGATIVE
BKR KETONES, UA: NEGATIVE
BKR NITRITE, UA: NEGATIVE
BKR PH, UA: 6 % (ref 5.5–7.5)
BKR SPECIFIC GRAVITY, UA: 1.02 (ref 1.005–1.030)
BKR UROBILINOGEN, UA (MG/DL): <2 mg/dL (ref <=2.0)

## 2022-06-19 LAB — LIPID PANEL
BKR CHOLESTEROL/HDL RATIO: 3.6 g/dL — ABNORMAL HIGH (ref 0.0–5.0)
BKR CHOLESTEROL: 145 mg/dL (ref 150–420)
BKR HDL CHOLESTEROL: 40 mg/dL (ref >=40–10.2)
BKR LDL CHOLESTEROL SAMPSON CALCULATED: 69 mg/dL
BKR TRIGLYCERIDES: 217 mg/dL — ABNORMAL HIGH

## 2022-06-19 LAB — ALBUMIN/CREATININE PANEL, URINE, RANDOM
BKR ALBUMIN, URINE, RANDOM: 26.2 mg/L
BKR ALBUMIN/CREATININE RATIO, URINE, RANDOM: 18 mg/g Cr (ref <30.0)
BKR CREATININE, URINE, RANDOM: 146 mg/dL

## 2022-06-19 LAB — COMPREHENSIVE METABOLIC PANEL
BKR ALANINE AMINOTRANSFERASE (ALT): 23 U/L (ref 10–35)
BKR ALBUMIN: 4.6 g/dL (ref 3.6–4.9)
BKR ALKALINE PHOSPHATASE: 76 U/L (ref 9–122)
BKR ANION GAP: 11 (ref 7–17)
BKR ASPARTATE AMINOTRANSFERASE (AST): 28 U/L (ref 10–35)
BKR AST/ALT RATIO: 1.2 x 1000/??L (ref 0.00–1.00)
BKR BILIRUBIN TOTAL: 0.4 mg/dL (ref <=1.2)
BKR BLOOD UREA NITROGEN: 21 mg/dL (ref 8–23)
BKR BUN / CREAT RATIO: 19.1 (ref 8.0–23.0)
BKR CALCIUM: 10 mg/dL (ref 8.8–10.2)
BKR CHLORIDE: 104 mmol/L (ref 98–107)
BKR CO2: 25 mmol/L (ref 20–30)
BKR CREATININE: 1.1 mg/dL (ref 0.40–1.30)
BKR EGFR, CREATININE (CKD-EPI 2021): 53 mL/min/{1.73_m2} — ABNORMAL LOW (ref >=60)
BKR GLOBULIN: 2.6 g/dL (ref 2.3–3.5)
BKR GLUCOSE: 136 mg/dL — ABNORMAL HIGH (ref 70–100)
BKR POTASSIUM: 5.1 mmol/L (ref 3.3–5.3)
BKR PROTEIN TOTAL: 7.2 g/dL (ref 6.6–8.7)
BKR SODIUM: 140 mmol/L (ref 136–144)
BKR WAM ANALYZER ANC: 28 U/L (ref 10–35)
BKR WAM BASOPHIL ABSOLUTE COUNT.: 25 mmol/L (ref 20–30)
BKR WAM HEMATOCRIT (2 DEC): 5.1 mmol/L — AB (ref 3.3–5.3)

## 2022-06-19 LAB — URINE MICROSCOPIC     (BH GH LMW YH)
BKR RBC/HPF INSTRUMENT: <1 /HPF (ref 0–2)
BKR URINE SQUAMOUS EPITHELIAL CELLS, UA (NUMERIC): 2 /HPF (ref 0–5)
BKR WBC/HPF INSTRUMENT: 3 /HPF (ref 0–5)

## 2022-06-19 LAB — VITAMIN B12: BKR VITAMIN B12: 353 pg/mL (ref 232–1245)

## 2022-06-19 NOTE — Other
Please notify patient labs are stable.  She remains anemic.  May consider colonoscopy as discussed during most recent visit

## 2022-06-22 ENCOUNTER — Ambulatory Visit: Payer: Medicare Other | Admitting: Physical Therapy

## 2022-06-22 ENCOUNTER — Encounter: Payer: Self-pay | Admitting: Physical Therapy

## 2022-06-22 DIAGNOSIS — M25561 Pain in right knee: Secondary | ICD-10-CM

## 2022-06-22 DIAGNOSIS — R600 Localized edema: Secondary | ICD-10-CM

## 2022-06-22 DIAGNOSIS — R2689 Other abnormalities of gait and mobility: Secondary | ICD-10-CM

## 2022-06-22 DIAGNOSIS — M25661 Stiffness of right knee, not elsewhere classified: Secondary | ICD-10-CM

## 2022-06-22 DIAGNOSIS — M6281 Muscle weakness (generalized): Secondary | ICD-10-CM

## 2022-06-22 NOTE — Therapy (Signed)
OUTPATIENT PHYSICAL THERAPY LOWER EXTREMITY TREATMENT    Patient Name: Gloria Mckinney MRN: [id number] DOB:03-31-1946, 76 y.o., female Today's Date: 06/22/2022   PT End of Session - 06/22/22 1617     Visit Number 17    Date for PT Re-Evaluation 07/17/22    Authorization Type Medicare    PT Start Time 1615    PT Stop Time 1701    PT Time Calculation (min) 46 min    Activity Tolerance Patient tolerated treatment well    Behavior During Therapy [facility] for tasks assessed/performed                Past Medical History:  Diagnosis Date   Cancer ([place])    GERD (gastroesophageal reflux disease)    History of radiation therapy 01/16/19- 02/13/19   left breast 15 fractions of 2.67 Gy for a total of 40.05 Gy. Left breast boost 5 fractions of 2 Gy for a total of 10 Gy   Hypertension    Mixed hyperlipidemia    OA (osteoarthritis)    knee right   Peripheral neuropathy    PVD (peripheral vascular disease) ([place])    Type 2 diabetes mellitus treated with insulin [facility])    endocrinologist--- dr Providence Crosby Yaden   Past Surgical History:  Procedure Laterality Date   BREAST BIOPSY Bilateral 2000   benign   BREAST LUMPECTOMY WITH RADIOACTIVE SEED AND SENTINEL LYMPH NODE BIOPSY Left 08/15/2018   Procedure: LEFT BREAST LUMPECTOMY WITH RADIOACTIVE SEED AND LEFT AXILLARY DEEP SENTINEL LYMPH NODE BIOPSY INJECT BLUE DYE LEFT BREAST;  Surgeon: Fanny Skates, MD;  Location: [place];  Service: General;  Laterality: Left;   PORT-A-CATH REMOVAL Right 08/09/2019   Procedure: REMOVAL PORT-A-CATH;  Surgeon: Fanny Skates, MD;  Location: [place];  Service: General;  Laterality: Right;   PORTACATH PLACEMENT Right 08/15/2018   Procedure: INSERTION PORT-A-CATH WITH ULTRASOUND;  Surgeon: Fanny Skates, MD;  Location: [place];  Service: General;  Laterality: Right;   SHOULDER ARTHROSCOPY Right 06-16-2002   dr graves   SHOULDER OPEN ROTATOR CUFF REPAIR  Right 07-28-2000    dr graves   VAGINAL HYSTERECTOMY  1992   with [place]  2007   Patient Active Problem List   Diagnosis Date Noted   Port-A-Cath in place 09/19/2018   Malignant neoplasm of upper-inner quadrant of left breast in female, estrogen receptor positive ([place]) 07/27/2018   Dyspnea on exertion 10/21/2016   Type 2 diabetes mellitus without complication, without long-term current use of insulin ([place]) 10/21/2016   Dyslipidemia 10/21/2016    PCP: [name]  REFERRING PROVIDER: Dorna Leitz   REFERRING DIAG: R TKA   THERAPY DIAG:  Acute pain of right knee  Stiffness of right knee, not elsewhere classified  Muscle weakness (generalized)  Other abnormalities of gait and mobility  Localized edema  Rationale for Evaluation and Treatment Rehabilitation  ONSET DATE: 03/25/22  SUBJECTIVE:   SUBJECTIVE STATEMENT: "Same" but doing better recently PERTINENT HISTORY: DM, HTN, PVD, shoulder sx, breast cancer sx   PAIN:  Are you having pain? Yes: NPRS scale: 2/10 Pain location: R knee Pain description: burning, achy  Aggravating factors: standing up, sitting, walking for a long time pain is up to 8/10 with standing >7 minutes  Relieving factors: oxycodone, celebrix, ice  PRECAUTIONS: Fall  WEIGHT BEARING RESTRICTIONS No  FALLS:  Has patient fallen in last 6 months? No  LIVING ENVIRONMENT: Lives with: lives with their family Lives in:  House/apartment Stairs: Yes: Internal: 15 steps; on right going up Has following equipment at home: Quad cane small base and Walker - 2 wheeled  OCCUPATION: retired  PLOF: Independent  PATIENT GOALS no pain, stand up and walk    OBJECTIVE:  COGNITION:  Overall cognitive status: Within functional limits for tasks assessed     SENSATION: WFL   MUSCLE LENGTH: Hamstrings: Right mod tightness  POSTURE: rounded shoulders, forward head, flexed trunk , and weight shift left  PALPATION: Warm, and tender  around R knee   LOWER EXTREMITY ROM:  Active ROM Right eval Right 04/22/22 Right  05/04/22 Right AROM 05/11/22 Right AROM 06/01/22 Right AROM 06/17/22  Hip flexion        Hip extension        Hip abduction        Hip adduction        Hip internal rotation        Hip external rotation        Knee flexion 75 100 100 105 110 110  Knee extension -_0 Ankle dorsiflexion        Ankle plantarflexion        Ankle inversion        Ankle eversion         (Blank rows = not tested)  LOWER EXTREMITY MMT:  MMT Right eval right 06/17/22  Hip flexion 3+   Hip extension    Hip abduction    Hip adduction    Hip internal rotation    Hip external rotation    Knee flexion 3+  4/5  Knee extension 4- w/pain 4/5  Ankle dorsiflexion 5   Ankle plantarflexion 5   Ankle inversion    Ankle eversion     (Blank rows = not tested)   FUNCTIONAL TESTS:  5 times sit to stand: 48.13s  Timed up and go (TUG): 29.87s     GAIT: Distance walked: in clinic distances Assistive device utilized: Environmental consultant - 2 wheeled Level of assistance: Modified independence Comments: slowed gait, antalgic pattern, decreased step and stance time on RLE   TODAY'S TREATMENT: 06/22/22 Bike able to do full revolutions most of 6 minutes Walk outside light HHA curbs, uneven terrain PROM, STM, patellar and scar mobs Weight shift on airex, marches on airex HS curls 20# x 10, 15# 2x10 Leg extension 5# 2x10 Tape to the medial right knee with 50% stretch to help pain   06/17/22 PROM, scar and joint mobilization Nustep level 4 x 6 minutes Bike partial to full revolutions x 6 minutes HS curls 15# both legs LAQ with focus on TKE Gait without device working on natural bend and step length, outside small lap around [employer], better bend   06/09/22 Nustep level5 x 6 mintues Bike got her to do full revs with some assist and trunk lean Weight shifts Standing on airex weight shifts, marching, cone toe touches  with SPC Stairs step over step on the 4" Gait with cues to bend Practice tub transfers, problem solved low toilet and practiced transfer STM and PROM to the right knee  06/03/22 Bike  partial revs 6 minutes Nustep level 4 x 4 minutes PROM to the right knee flexiona nd extension STM tot he patella and the tissue around the knee LAQ 2# 3x10 Red tband flexion knee Red tband ankle exercises Talked about elevation and ankle pumps at home Vaso medium pressure in elevation Gait with SPC and without SPC x  100 feet  06/01/22 Nustep level 3 x 6 minutes Bike partial revs 6 minutes LAQ 3x10 Gait with SPC working on natural bending and faster gait speed PROM to the right knee and STM to the scar and patella area Vaso medium pressure 34 degrees right knee in elevation  05/27/22 Circumferential measure left 40, right 43 cm Focused on her right leg in elevation a more lymphedema type massage for swelling, some patellar and scar mobilization Ankle pumps with the leg up and educated her on doing this at home Vaso medium pressure right knee 37 degrees  PATIENT EDUCATION:  Education details: POC and HEP Person educated: Patient and Child(ren) Education method: Explanation Education comprehension: verbalized understanding and returned demonstration   HOME EXERCISE PROGRAM: Calf raises in RW  Mini squats in RW  Seated LAQ  Supine SAQ   ASSESSMENT:  CLINICAL IMPRESSION: Patient  doing well with her ROM, she is walking with a more natural bend, she does have some antalgic gait on the right at mid stance, able to go around on the bike much easier, I did try tape to help with pain ACTIVITY LIMITATIONS lifting, bending, sitting, standing, squatting, stairs, toileting, hygiene/grooming, and locomotion level  REHAB POTENTIAL: Good  CLINICAL DECISION MAKING: Stable/uncomplicated  EVALUATION COMPLEXITY: Low   GOALS: Goals reviewed with patient? Yes  SHORT TERM GOALS: Target date:  05/12/22  Patient will be independent with initial HEP. Goal status:met  2.  Patient will report a decrease in R knee pain to 2/10 or better.  Baseline: 5/10 Goal status: ongoing   LONG TERM GOALS: Target date: 06/16/22  Patient will be independent with advanced/ongoing HEP to improve outcomes and carryover.  Goal status: ongoing 2.  Patient will demonstrate improved R knee AROM to >/= 5-120 deg to allow for normal gait, stair mechanics, and to be able to sit and stand from her toilet. Baseline: 20-0-75 Goal status:partially met  3.  Patient will demonstrate improved functional LE strength as demonstrated by 4/5 in all weak muscle groups. Goal status: ongoing  4.  Patient will be able to ambulate 600' with normal gait pattern without increased pain to access community.  Baseline: walking w/RW Goal status:ongoing  5. Patient will be able to ascend/descend stairs with 1 HR and reciprocal step pattern safely to access home and community.  Goal status: ongoing  6.  Patient will demonstrate TUG score to <15s w/o AD to decrease fall risk and return to walking in her community  Baseline: 16 seconds 06/17/22 Goal status:ongoing  7.  Patient will demonstrate 5xSTS score to < 20s to decrease fall risk and show functional strength improvement.  Baseline: 48.13s Goal status: INITIAL    PLAN: PT FREQUENCY: 2x/week  PT DURATION: 12 weeks  PLANNED INTERVENTIONS: Therapeutic exercises, Therapeutic activity, Neuromuscular re-education, Balance training, Gait training, Patient/Family education, Self Care, Joint mobilization, Stair training, Electrical stimulation, Cryotherapy, Moist heat, Vasopneumatic device, Ionotophoresis 41m/ml Dexamethasone, and Manual therapy  PLAN FOR NEXT SESSION: see if tape helped  MLum Babe PT 06/22/2022, 4:17 PM [place] [id number]: [phone]  Fax:   [phone]

## 2022-06-23 NOTE — Other
Lvm as allowed per HIPAA

## 2022-06-24 ENCOUNTER — Encounter: Payer: Self-pay | Admitting: Physical Therapy

## 2022-06-24 ENCOUNTER — Ambulatory Visit: Payer: Medicare Other | Admitting: Physical Therapy

## 2022-06-24 DIAGNOSIS — M25661 Stiffness of right knee, not elsewhere classified: Secondary | ICD-10-CM

## 2022-06-24 DIAGNOSIS — R600 Localized edema: Secondary | ICD-10-CM

## 2022-06-24 DIAGNOSIS — M6281 Muscle weakness (generalized): Secondary | ICD-10-CM

## 2022-06-24 DIAGNOSIS — M25561 Pain in right knee: Secondary | ICD-10-CM | POA: Diagnosis not present

## 2022-06-24 DIAGNOSIS — R2689 Other abnormalities of gait and mobility: Secondary | ICD-10-CM

## 2022-06-24 NOTE — Therapy (Signed)
OUTPATIENT PHYSICAL THERAPY LOWER EXTREMITY TREATMENT    Patient Name: Gloria Mckinney MRN: [id number] DOB:01/23/1946, 76 y.o., female Today's Date: 06/24/2022   PT End of Session - 06/24/22 1705     Visit Number 18    Date for PT Re-Evaluation 07/17/22    Authorization Type Medicare    PT Start Time 3825    PT Stop Time 1657    PT Time Calculation (min) 42 min    Activity Tolerance Patient tolerated treatment well    Behavior During Therapy [facility] for tasks assessed/performed                Past Medical History:  Diagnosis Date   Cancer ([place])    GERD (gastroesophageal reflux disease)    History of radiation therapy 01/16/19- 02/13/19   left breast 15 fractions of 2.67 Gy for a total of 40.05 Gy. Left breast boost 5 fractions of 2 Gy for a total of 10 Gy   Hypertension    Mixed hyperlipidemia    OA (osteoarthritis)    knee right   Peripheral neuropathy    PVD (peripheral vascular disease) ([place])    Type 2 diabetes mellitus treated with insulin [facility])    endocrinologist--- dr Providence Crosby Vanhouten   Past Surgical History:  Procedure Laterality Date   BREAST BIOPSY Bilateral 2000   benign   BREAST LUMPECTOMY WITH RADIOACTIVE SEED AND SENTINEL LYMPH NODE BIOPSY Left 08/15/2018   Procedure: LEFT BREAST LUMPECTOMY WITH RADIOACTIVE SEED AND LEFT AXILLARY DEEP SENTINEL LYMPH NODE BIOPSY INJECT BLUE DYE LEFT BREAST;  Surgeon: Fanny Skates, MD;  Location: [place];  Service: General;  Laterality: Left;   PORT-A-CATH REMOVAL Right 08/09/2019   Procedure: REMOVAL PORT-A-CATH;  Surgeon: Fanny Skates, MD;  Location: [place];  Service: General;  Laterality: Right;   PORTACATH PLACEMENT Right 08/15/2018   Procedure: INSERTION PORT-A-CATH WITH ULTRASOUND;  Surgeon: Fanny Skates, MD;  Location: [place];  Service: General;  Laterality: Right;   SHOULDER ARTHROSCOPY Right 06-16-2002   dr graves   SHOULDER OPEN ROTATOR CUFF REPAIR  Right 07-28-2000    dr graves   VAGINAL HYSTERECTOMY  1992   with [place]  2007   Patient Active Problem List   Diagnosis Date Noted   Port-A-Cath in place 09/19/2018   Malignant neoplasm of upper-inner quadrant of left breast in female, estrogen receptor positive ([place]) 07/27/2018   Dyspnea on exertion 10/21/2016   Type 2 diabetes mellitus without complication, without long-term current use of insulin ([place]) 10/21/2016   Dyslipidemia 10/21/2016    PCP: [name]  REFERRING PROVIDER: Dorna Leitz   REFERRING DIAG: R TKA   THERAPY DIAG:  Acute pain of right knee  Stiffness of right knee, not elsewhere classified  Muscle weakness (generalized)  Other abnormalities of gait and mobility  Localized edema  Rationale for Evaluation and Treatment Rehabilitation  ONSET DATE: 03/25/22  SUBJECTIVE:   SUBJECTIVE STATEMENT: "She saw the MD last week, gave prednisone dose pack, she is finished with this and reports no changes PERTINENT HISTORY: DM, HTN, PVD, shoulder sx, breast cancer sx   PAIN:  Are you having pain? Yes: NPRS scale: 2/10 Pain location: R knee Pain description: burning, achy  Aggravating factors: standing up, sitting, walking for a long time pain is up to 8/10 with standing >7 minutes  Relieving factors: oxycodone, celebrix, ice  PRECAUTIONS: Fall  WEIGHT BEARING RESTRICTIONS No  FALLS:  Has patient fallen in last  6 months? No  LIVING ENVIRONMENT: Lives with: lives with their family Lives in: House/apartment Stairs: Yes: Internal: 15 steps; on right going up Has following equipment at home: Gaffer - 2 wheeled  OCCUPATION: retired  PLOF: Independent  PATIENT GOALS no pain, stand up and walk    OBJECTIVE:  COGNITION:  Overall cognitive status: Within functional limits for tasks assessed     SENSATION: WFL   MUSCLE LENGTH: Hamstrings: Right mod tightness  POSTURE: rounded shoulders, forward head,  flexed trunk , and weight shift left  PALPATION: Warm, and tender around R knee   LOWER EXTREMITY ROM:  Active ROM Right eval Right 04/22/22 Right  05/04/22 Right AROM 05/11/22 Right AROM 06/01/22 Right AROM 06/17/22  Hip flexion        Hip extension        Hip abduction        Hip adduction        Hip internal rotation        Hip external rotation        Knee flexion 75 100 100 105 110 110  Knee extension -_0 Ankle dorsiflexion        Ankle plantarflexion        Ankle inversion        Ankle eversion         (Blank rows = not tested)  LOWER EXTREMITY MMT:  MMT Right eval right 06/17/22  Hip flexion 3+   Hip extension    Hip abduction    Hip adduction    Hip internal rotation    Hip external rotation    Knee flexion 3+  4/5  Knee extension 4- w/pain 4/5  Ankle dorsiflexion 5   Ankle plantarflexion 5   Ankle inversion    Ankle eversion     (Blank rows = not tested)   FUNCTIONAL TESTS:  5 times sit to stand: 48.13s  Timed up and go (TUG): 29.87s     GAIT: Distance walked: in clinic distances Assistive device utilized: Environmental consultant - 2 wheeled Level of assistance: Modified independence Comments: slowed gait, antalgic pattern, decreased step and stance time on RLE   TODAY'S TREATMENT: 06/24/22 Bike level 1 x 6 minutes full revs [place] and STM to the right medial knee, some education to her daughter on massage Tape to the medial knee with 50-75% stretch for pain and swelling  06/22/22 Bike able to do full revolutions most of 6 minutes Walk outside light HHA curbs, uneven terrain PROM, STM, patellar and scar mobs Weight shift on airex, marches on airex HS curls 20# x 10, 15# 2x10 Leg extension 5# 2x10 Tape to the medial right knee with 50% stretch to help pain   06/17/22 PROM, scar and joint mobilization Nustep level 4 x 6 minutes Bike partial to full revolutions x 6 minutes HS curls 15# both legs LAQ with focus on TKE Gait without device working  on natural bend and step length, outside small lap around [employer], better bend   06/09/22 Nustep level5 x 6 mintues Bike got her to do full revs with some assist and trunk lean Weight shifts Standing on airex weight shifts, marching, cone toe touches with SPC Stairs step over step on the 4" Gait with cues to bend Practice tub transfers, problem solved low toilet and practiced transfer STM and PROM to the right knee  06/03/22 Bike  partial revs 6 minutes Nustep level 4 x  4 minutes PROM to the right knee flexiona nd extension STM tot he patella and the tissue around the knee LAQ 2# 3x10 Red tband flexion knee Red tband ankle exercises Talked about elevation and ankle pumps at home Vaso medium pressure in elevation Gait with SPC and without SPC x 100 feet  06/01/22 Nustep level 3 x 6 minutes Bike partial revs 6 minutes LAQ 3x10 Gait with SPC working on natural bending and faster gait speed PROM to the right knee and STM to the scar and patella area Vaso medium pressure 34 degrees right knee in elevation  05/27/22 Circumferential measure left 40, right 43 cm Focused on her right leg in elevation a more lymphedema type massage for swelling, some patellar and scar mobilization Ankle pumps with the leg up and educated her on doing this at home Vaso medium pressure right knee 37 degrees  PATIENT EDUCATION:  Education details: POC and HEP Person educated: Patient and Child(ren) Education method: Explanation Education comprehension: verbalized understanding and returned demonstration   HOME EXERCISE PROGRAM: Calf raises in RW  Mini squats in RW  Seated LAQ  Supine SAQ   ASSESSMENT:  CLINICAL IMPRESSION: Patient  again able to do full revolutions on the bike, she however continues to report the same pain, I do feel that the swelling is a little better she does have some warmth but within normal for her post op.  I have decided to focus on pain relief to see if there is  anything that we can do as she is walking better and has good motion, added [place] today and a different tape  ACTIVITY LIMITATIONS lifting, bending, sitting, standing, squatting, stairs, toileting, hygiene/grooming, and locomotion level  REHAB POTENTIAL: Good  CLINICAL DECISION MAKING: Stable/uncomplicated  EVALUATION COMPLEXITY: Low   GOALS: Goals reviewed with patient? Yes  SHORT TERM GOALS: Target date: 05/12/22  Patient will be independent with initial HEP. Goal status:met  2.  Patient will report a decrease in R knee pain to 2/10 or better.  Baseline: 5/10 Goal status: met   LONG TERM GOALS: Target date: 06/16/22  Patient will be independent with advanced/ongoing HEP to improve outcomes and carryover.  Goal status: ongoing 2.  Patient will demonstrate improved R knee AROM to >/= 5-120 deg to allow for normal gait, stair mechanics, and to be able to sit and stand from her toilet. Baseline: 20-0-75 Goal status:partially met  3.  Patient will demonstrate improved functional LE strength as demonstrated by 4/5 in all weak muscle groups. Goal status: ongoing  4.  Patient will be able to ambulate 600' with normal gait pattern without increased pain to access community.  Baseline: walking w/RW Goal status:ongoing  5. Patient will be able to ascend/descend stairs with 1 HR and reciprocal step pattern safely to access home and community.  Goal status: ongoing  6.  Patient will demonstrate TUG score to <15s w/o AD to decrease fall risk and return to walking in her community  Baseline: 16 seconds 06/17/22 Goal status:ongoing  7.  Patient will demonstrate 5xSTS score to < 20s to decrease fall risk and show functional strength improvement.  Baseline: 48.13s Goal status: INITIAL    PLAN: PT FREQUENCY: 2x/week  PT DURATION: 12 weeks  PLANNED INTERVENTIONS: Therapeutic exercises, Therapeutic activity, Neuromuscular re-education, Balance training, Gait training, Patient/Family  education, Self Care, Joint mobilization, Stair training, Electrical stimulation, Cryotherapy, Moist heat, Vasopneumatic device, Ionotophoresis 15m/ml Dexamethasone, and Manual therapy  PLAN FOR NEXT SESSION: see if tape helped  MLegrand [place]  Riki Sheer, PT 06/24/2022, 5:06 PM [place] [place], [place], [address] Phone: [phone]   Fax:  [phone]

## 2022-07-06 ENCOUNTER — Ambulatory Visit: Payer: Medicare Other | Admitting: Physical Therapy

## 2022-07-06 ENCOUNTER — Encounter: Payer: Self-pay | Admitting: Physical Therapy

## 2022-07-06 DIAGNOSIS — R2689 Other abnormalities of gait and mobility: Secondary | ICD-10-CM

## 2022-07-06 DIAGNOSIS — R600 Localized edema: Secondary | ICD-10-CM

## 2022-07-06 DIAGNOSIS — M6281 Muscle weakness (generalized): Secondary | ICD-10-CM

## 2022-07-06 DIAGNOSIS — M25561 Pain in right knee: Secondary | ICD-10-CM | POA: Diagnosis not present

## 2022-07-06 DIAGNOSIS — M25661 Stiffness of right knee, not elsewhere classified: Secondary | ICD-10-CM

## 2022-07-06 NOTE — Therapy (Signed)
OUTPATIENT PHYSICAL THERAPY LOWER EXTREMITY TREATMENT    Patient Name: Gloria Mckinney MRN: [id number] DOB:12-30-1945, 76 y.o., female Today's Date: 07/06/2022   PT End of Session - 07/06/22 1627     Visit Number 19    Date for PT Re-Evaluation 07/17/22    Authorization Type Medicare    PT Start Time 1619    PT Stop Time 1700    PT Time Calculation (min) 41 min    Activity Tolerance Patient tolerated treatment well    Behavior During Therapy [facility] for tasks assessed/performed                Past Medical History:  Diagnosis Date   Cancer ([place])    GERD (gastroesophageal reflux disease)    History of radiation therapy 01/16/19- 02/13/19   left breast 15 fractions of 2.67 Gy for a total of 40.05 Gy. Left breast boost 5 fractions of 2 Gy for a total of 10 Gy   Hypertension    Mixed hyperlipidemia    OA (osteoarthritis)    knee right   Peripheral neuropathy    PVD (peripheral vascular disease) ([place])    Type 2 diabetes mellitus treated with insulin [facility])    endocrinologist--- dr Providence Crosby [place]   Past Surgical History:  Procedure Laterality Date   BREAST BIOPSY Bilateral 2000   benign   BREAST LUMPECTOMY WITH RADIOACTIVE SEED AND SENTINEL LYMPH NODE BIOPSY Left 08/15/2018   Procedure: LEFT BREAST LUMPECTOMY WITH RADIOACTIVE SEED AND LEFT AXILLARY DEEP SENTINEL LYMPH NODE BIOPSY INJECT BLUE DYE LEFT BREAST;  Surgeon: Fanny Skates, MD;  Location: [place];  Service: General;  Laterality: Left;   PORT-A-CATH REMOVAL Right 08/09/2019   Procedure: REMOVAL PORT-A-CATH;  Surgeon: Fanny Skates, MD;  Location: [place];  Service: General;  Laterality: Right;   PORTACATH PLACEMENT Right 08/15/2018   Procedure: INSERTION PORT-A-CATH WITH ULTRASOUND;  Surgeon: Fanny Skates, MD;  Location: [place];  Service: General;  Laterality: Right;   SHOULDER ARTHROSCOPY Right 06-16-2002   dr graves   SHOULDER OPEN ROTATOR CUFF REPAIR  Right 07-28-2000    dr graves   VAGINAL HYSTERECTOMY  1992   with [place]  2007   Patient Active Problem List   Diagnosis Date Noted   Port-A-Cath in place 09/19/2018   Malignant neoplasm of upper-inner quadrant of left breast in female, estrogen receptor positive ([place]) 07/27/2018   Dyspnea on exertion 10/21/2016   Type 2 diabetes mellitus without complication, without long-term current use of insulin ([place]) 10/21/2016   Dyslipidemia 10/21/2016    PCP: [name]  REFERRING PROVIDER: Dorna Leitz   REFERRING DIAG: R TKA   THERAPY DIAG:  Acute pain of right knee  Stiffness of right knee, not elsewhere classified  Muscle weakness (generalized)  Other abnormalities of gait and mobility  Localized edema  Rationale for Evaluation and Treatment Rehabilitation  ONSET DATE: 03/25/22  SUBJECTIVE:   SUBJECTIVE STATEMENT: Patient continues to report pain with standing pain int he medial right knee PERTINENT HISTORY: DM, HTN, PVD, shoulder sx, breast cancer sx   PAIN:  Are you having pain? Yes: NPRS scale: 6/10 Pain location: R knee Pain description: burning, achy  Aggravating factors: standing up, sitting, walking for a long time pain is up to 8/10 with standing >7 minutes  Relieving factors: oxycodone, celebrix, ice  PRECAUTIONS: Fall  WEIGHT BEARING RESTRICTIONS No  FALLS:  Has patient fallen in last 6 months? No  LIVING ENVIRONMENT:  Lives with: lives with their family Lives in: House/apartment Stairs: Yes: Internal: 15 steps; on right going up Has following equipment at home: Gaffer - 2 wheeled  OCCUPATION: retired  PLOF: Independent  PATIENT GOALS no pain, stand up and walk    OBJECTIVE:  COGNITION:  Overall cognitive status: Within functional limits for tasks assessed     SENSATION: WFL   MUSCLE LENGTH: Hamstrings: Right mod tightness  POSTURE: rounded shoulders, forward head, flexed trunk , and weight  shift left  PALPATION: Warm, and tender around R knee   LOWER EXTREMITY ROM:  Active ROM Right eval Right 04/22/22 Right  05/04/22 Right AROM 05/11/22 Right AROM 06/01/22 Right AROM 06/17/22  Hip flexion        Hip extension        Hip abduction        Hip adduction        Hip internal rotation        Hip external rotation        Knee flexion 75 100 100 105 110 110  Knee extension -_0 Ankle dorsiflexion        Ankle plantarflexion        Ankle inversion        Ankle eversion         (Blank rows = not tested)  LOWER EXTREMITY MMT:  MMT Right eval right 06/17/22  Hip flexion 3+   Hip extension    Hip abduction    Hip adduction    Hip internal rotation    Hip external rotation    Knee flexion 3+  4/5  Knee extension 4- w/pain 4/5  Ankle dorsiflexion 5   Ankle plantarflexion 5   Ankle inversion    Ankle eversion     (Blank rows = not tested)   FUNCTIONAL TESTS:  5 times sit to stand: 48.13s  Timed up and go (TUG): 29.87s     GAIT: Distance walked: in clinic distances Assistive device utilized: Environmental consultant - 2 wheeled Level of assistance: Modified independence Comments: slowed gait, antalgic pattern, decreased step and stance time on RLE   TODAY'S TREATMENT: 07/06/22 Nustep level 5 x 5 minutes Bike full revolutions Joint mobilization and then mobs with movements, some distraction STM to the right medial knee and then the posterior knee area and into the calf US/estim combo to the above Tape to uload the knee joint  06/24/22 Bike level 1 x 6 minutes full revs [place] and STM to the right medial knee, some education to her daughter on massage Tape to the medial knee with 50-75% stretch for pain and swelling  06/22/22 Bike able to do full revolutions most of 6 minutes Walk outside light HHA curbs, uneven terrain PROM, STM, patellar and scar mobs Weight shift on airex, marches on airex HS curls 20# x 10, 15# 2x10 Leg extension 5# 2x10 Tape to the  medial right knee with 50% stretch to help pain   06/17/22 PROM, scar and joint mobilization Nustep level 4 x 6 minutes Bike partial to full revolutions x 6 minutes HS curls 15# both legs LAQ with focus on TKE Gait without device working on natural bend and step length, outside small lap around [employer], better bend   06/09/22 Nustep level5 x 6 mintues Bike got her to do full revs with some assist and trunk lean Weight shifts Standing on airex weight shifts, marching, cone toe touches with SPC  Stairs step over step on the 4" Gait with cues to bend Practice tub transfers, problem solved low toilet and practiced transfer STM and PROM to the right knee  06/03/22 Bike  partial revs 6 minutes Nustep level 4 x 4 minutes PROM to the right knee flexiona nd extension STM tot he patella and the tissue around the knee LAQ 2# 3x10 Red tband flexion knee Red tband ankle exercises Talked about elevation and ankle pumps at home Vaso medium pressure in elevation Gait with SPC and without SPC x 100 feet  06/01/22 Nustep level 3 x 6 minutes Bike partial revs 6 minutes LAQ 3x10 Gait with SPC working on natural bending and faster gait speed PROM to the right knee and STM to the scar and patella area Vaso medium pressure 34 degrees right knee in elevation  05/27/22 Circumferential measure left 40, right 43 cm Focused on her right leg in elevation a more lymphedema type massage for swelling, some patellar and scar mobilization Ankle pumps with the leg up and educated her on doing this at home Vaso medium pressure right knee 37 degrees  PATIENT EDUCATION:  Education details: POC and HEP Person educated: Patient and Child(ren) Education method: Explanation Education comprehension: verbalized understanding and returned demonstration   HOME EXERCISE PROGRAM: Calf raises in RW  Mini squats in RW  Seated LAQ  Supine SAQ   ASSESSMENT:  CLINICAL IMPRESSION: Patient  struggling  with standing and walking, due to pain, she has good ROM, she just really hurts with walking and standing, difficulty cooking anything, I changed some things up but she still was pretty painful with walking out  ACTIVITY LIMITATIONS lifting, bending, sitting, standing, squatting, stairs, toileting, hygiene/grooming, and locomotion level  REHAB POTENTIAL: Good  CLINICAL DECISION MAKING: Stable/uncomplicated  EVALUATION COMPLEXITY: Low   GOALS: Goals reviewed with patient? Yes  SHORT TERM GOALS: Target date: 05/12/22  Patient will be independent with initial HEP. Goal status:met  2.  Patient will report a decrease in R knee pain to 2/10 or better.  Baseline: 5/10 Goal status: met   LONG TERM GOALS: Target date: 06/16/22  Patient will be independent with advanced/ongoing HEP to improve outcomes and carryover.  Goal status: ongoing 2.  Patient will demonstrate improved R knee AROM to >/= 5-120 deg to allow for normal gait, stair mechanics, and to be able to sit and stand from her toilet. Baseline: 20-0-75 Goal status:partially met  3.  Patient will demonstrate improved functional LE strength as demonstrated by 4/5 in all weak muscle groups. Goal status: ongoing  4.  Patient will be able to ambulate 600' with normal gait pattern without increased pain to access community.  Baseline: walking w/RW Goal status:ongoing  5. Patient will be able to ascend/descend stairs with 1 HR and reciprocal step pattern safely to access home and community.  Goal status: ongoing  6.  Patient will demonstrate TUG score to <15s w/o AD to decrease fall risk and return to walking in her community  Baseline: 16 seconds 06/17/22 Goal status:ongoing  7.  Patient will demonstrate 5xSTS score to < 20s to decrease fall risk and show functional strength improvement.  Baseline: 48.13s Goal status: INITIAL    PLAN: PT FREQUENCY: 2x/week  PT DURATION: 12 weeks  PLANNED INTERVENTIONS: Therapeutic  exercises, Therapeutic activity, Neuromuscular re-education, Balance training, Gait training, Patient/Family education, Self Care, Joint mobilization, Stair training, Electrical stimulation, Cryotherapy, Moist heat, Vasopneumatic device, Ionotophoresis 41m/ml Dexamethasone, and Manual therapy  PLAN FOR NEXT SESSION: may hold  treatment to see if this helps with a good hEP since pain is the limiting factor  Lum Babe, PT 07/06/2022, 4:28 PM [address] [place], [place], [address] Phone: [phone]   Fax:  [phone]

## 2022-07-08 ENCOUNTER — Ambulatory Visit: Payer: Medicare Other | Admitting: Physical Therapy

## 2022-08-20 ENCOUNTER — Other Ambulatory Visit: Payer: Self-pay | Admitting: Family Medicine

## 2022-08-20 DIAGNOSIS — Z1231 Encounter for screening mammogram for malignant neoplasm of breast: Secondary | ICD-10-CM

## 2022-10-05 ENCOUNTER — Ambulatory Visit
Admission: RE | Admit: 2022-10-05 | Discharge: 2022-10-05 | Disposition: A | Discharge status: Home / Self Care | Payer: Medicare Other | Source: Ambulatory Visit | Attending: Family Medicine | Admitting: Family Medicine

## 2022-10-05 DIAGNOSIS — Z1231 Encounter for screening mammogram for malignant neoplasm of breast: Secondary | ICD-10-CM

## 2022-11-03 ENCOUNTER — Other Ambulatory Visit: Payer: Self-pay | Admitting: Hematology and Oncology

## 2022-12-02 ENCOUNTER — Emergency Department (HOSPITAL_COMMUNITY): Payer: Medicare Other | Admitting: Certified Registered Nurse Anesthetist

## 2022-12-02 ENCOUNTER — Other Ambulatory Visit: Payer: Self-pay

## 2022-12-02 ENCOUNTER — Encounter (HOSPITAL_COMMUNITY): Admission: EM | Disposition: A | Discharge status: Home / Self Care | Payer: Self-pay | Source: Home / Self Care

## 2022-12-02 ENCOUNTER — Encounter: Payer: Self-pay | Admitting: Hematology and Oncology

## 2022-12-02 ENCOUNTER — Emergency Department (HOSPITAL_COMMUNITY): Payer: Medicare Other

## 2022-12-02 ENCOUNTER — Encounter (HOSPITAL_COMMUNITY): Payer: Self-pay

## 2022-12-02 ENCOUNTER — Emergency Department (HOSPITAL_BASED_OUTPATIENT_CLINIC_OR_DEPARTMENT_OTHER): Payer: Medicare Other | Admitting: Certified Registered Nurse Anesthetist

## 2022-12-02 ENCOUNTER — Inpatient Hospital Stay (HOSPITAL_COMMUNITY)
Admission: EM | Admit: 2022-12-02 | Discharge: 2022-12-06 | DRG: 399 | Disposition: A | Discharge status: Home / Self Care | Payer: Medicare Other | Attending: Surgery | Admitting: Surgery

## 2022-12-02 ENCOUNTER — Ambulatory Visit (HOSPITAL_COMMUNITY)
Admission: EM | Admit: 2022-12-02 | Discharge: 2022-12-02 | Disposition: A | Discharge status: Home / Self Care | Payer: Medicare Other | Attending: Family Medicine | Admitting: Family Medicine

## 2022-12-02 DIAGNOSIS — Z79899 Other long term (current) drug therapy: Secondary | ICD-10-CM

## 2022-12-02 DIAGNOSIS — K37 Unspecified appendicitis: Secondary | ICD-10-CM | POA: Diagnosis not present

## 2022-12-02 DIAGNOSIS — I10 Essential (primary) hypertension: Secondary | ICD-10-CM

## 2022-12-02 DIAGNOSIS — Z7982 Long term (current) use of aspirin: Secondary | ICD-10-CM

## 2022-12-02 DIAGNOSIS — Z1152 Encounter for screening for COVID-19: Secondary | ICD-10-CM

## 2022-12-02 DIAGNOSIS — K3580 Unspecified acute appendicitis: Secondary | ICD-10-CM | POA: Diagnosis not present

## 2022-12-02 DIAGNOSIS — E1151 Type 2 diabetes mellitus with diabetic peripheral angiopathy without gangrene: Secondary | ICD-10-CM | POA: Diagnosis not present

## 2022-12-02 DIAGNOSIS — Z9079 Acquired absence of other genital organ(s): Secondary | ICD-10-CM

## 2022-12-02 DIAGNOSIS — Z8249 Family history of ischemic heart disease and other diseases of the circulatory system: Secondary | ICD-10-CM

## 2022-12-02 DIAGNOSIS — R1031 Right lower quadrant pain: Secondary | ICD-10-CM | POA: Diagnosis not present

## 2022-12-02 DIAGNOSIS — E1142 Type 2 diabetes mellitus with diabetic polyneuropathy: Secondary | ICD-10-CM | POA: Diagnosis present

## 2022-12-02 DIAGNOSIS — Z881 Allergy status to other antibiotic agents status: Secondary | ICD-10-CM

## 2022-12-02 DIAGNOSIS — Z833 Family history of diabetes mellitus: Secondary | ICD-10-CM

## 2022-12-02 DIAGNOSIS — R000 Tachycardia, unspecified: Secondary | ICD-10-CM | POA: Diagnosis present

## 2022-12-02 DIAGNOSIS — Z880 Allergy status to penicillin: Secondary | ICD-10-CM

## 2022-12-02 DIAGNOSIS — E876 Hypokalemia: Secondary | ICD-10-CM | POA: Diagnosis not present

## 2022-12-02 DIAGNOSIS — Z888 Allergy status to other drugs, medicaments and biological substances status: Secondary | ICD-10-CM

## 2022-12-02 DIAGNOSIS — E782 Mixed hyperlipidemia: Secondary | ICD-10-CM | POA: Diagnosis present

## 2022-12-02 DIAGNOSIS — Z794 Long term (current) use of insulin: Secondary | ICD-10-CM

## 2022-12-02 DIAGNOSIS — Z7984 Long term (current) use of oral hypoglycemic drugs: Secondary | ICD-10-CM

## 2022-12-02 DIAGNOSIS — Z90722 Acquired absence of ovaries, bilateral: Secondary | ICD-10-CM

## 2022-12-02 DIAGNOSIS — Z90710 Acquired absence of both cervix and uterus: Secondary | ICD-10-CM

## 2022-12-02 DIAGNOSIS — K219 Gastro-esophageal reflux disease without esophagitis: Secondary | ICD-10-CM | POA: Diagnosis present

## 2022-12-02 DIAGNOSIS — Z79811 Long term (current) use of aromatase inhibitors: Secondary | ICD-10-CM

## 2022-12-02 DIAGNOSIS — M1711 Unilateral primary osteoarthritis, right knee: Secondary | ICD-10-CM | POA: Diagnosis present

## 2022-12-02 DIAGNOSIS — Z923 Personal history of irradiation: Secondary | ICD-10-CM

## 2022-12-02 HISTORY — PX: LAPAROSCOPIC APPENDECTOMY: SHX408

## 2022-12-02 LAB — CBC WITH DIFFERENTIAL/PLATELET
Abs Immature Granulocytes: 0.1 10*3/uL — ABNORMAL HIGH (ref 0.00–0.07)
Basophils Absolute: 0 10*3/uL (ref 0.0–0.1)
Basophils Relative: 0 %
Eosinophils Absolute: 0 10*3/uL (ref 0.0–0.5)
Eosinophils Relative: 0 %
HCT: 38.6 % (ref 36.0–46.0)
Hemoglobin: 13.1 g/dL (ref 12.0–15.0)
Immature Granulocytes: 1 %
Lymphocytes Relative: 3 %
Lymphs Abs: 0.6 10*3/uL — ABNORMAL LOW (ref 0.7–4.0)
MCH: 28.1 pg (ref 26.0–34.0)
MCHC: 33.9 g/dL (ref 30.0–36.0)
MCV: 82.7 fL (ref 80.0–100.0)
Monocytes Absolute: 1.2 10*3/uL — ABNORMAL HIGH (ref 0.1–1.0)
Monocytes Relative: 6 %
Neutro Abs: 18 10*3/uL — ABNORMAL HIGH (ref 1.7–7.7)
Neutrophils Relative %: 90 %
Platelets: 251 10*3/uL (ref 150–400)
RBC: 4.67 MIL/uL (ref 3.87–5.11)
RDW: 12.3 % (ref 11.5–15.5)
WBC: 19.9 10*3/uL — ABNORMAL HIGH (ref 4.0–10.5)
nRBC: 0 % (ref 0.0–0.2)

## 2022-12-02 LAB — COMPREHENSIVE METABOLIC PANEL
ALT: 21 U/L (ref 0–44)
AST: 29 U/L (ref 15–41)
Albumin: 3.9 g/dL (ref 3.5–5.0)
Alkaline Phosphatase: 62 U/L (ref 38–126)
Anion gap: 13 (ref 5–15)
BUN: 14 mg/dL (ref 8–23)
CO2: 23 mmol/L (ref 22–32)
Calcium: 9.9 mg/dL (ref 8.9–10.3)
Chloride: 95 mmol/L — ABNORMAL LOW (ref 98–111)
Creatinine, Ser: 0.88 mg/dL (ref 0.44–1.00)
GFR, Estimated: >60 mL/min (ref >60)
Glucose, Bld: 208 mg/dL — ABNORMAL HIGH (ref 70–99)
Potassium: 3.7 mmol/L (ref 3.5–5.1)
Sodium: 131 mmol/L — ABNORMAL LOW (ref 135–145)
Total Bilirubin: 1 mg/dL (ref 0.3–1.2)
Total Protein: 7.5 g/dL (ref 6.5–8.1)

## 2022-12-02 LAB — RESP PANEL BY RT-PCR (RSV, FLU A&B, COVID)  RVPGX2
Influenza A by PCR: NEGATIVE
Influenza B by PCR: NEGATIVE
Resp Syncytial Virus by PCR: NEGATIVE
SARS Coronavirus 2 by RT PCR: NEGATIVE

## 2022-12-02 LAB — PROTIME-INR
INR: 1 (ref 0.8–1.2)
Prothrombin Time: 13.5 seconds (ref 11.4–15.2)

## 2022-12-02 LAB — CBG MONITORING, ED: Glucose-Capillary: 202 mg/dL — ABNORMAL HIGH (ref 70–99)

## 2022-12-02 LAB — LACTIC ACID, PLASMA
Lactic Acid, Venous: 3 mmol/L (ref 0.5–1.9)
Lactic Acid, Venous: 3.1 mmol/L (ref 0.5–1.9)

## 2022-12-02 LAB — GLUCOSE, CAPILLARY: Glucose-Capillary: 159 mg/dL — ABNORMAL HIGH (ref 70–99)

## 2022-12-02 LAB — APTT: aPTT: 32 seconds (ref 24–36)

## 2022-12-02 SURGERY — APPENDECTOMY, LAPAROSCOPIC
Anesthesia: General

## 2022-12-02 MED ORDER — HYDROCHLOROTHIAZIDE 25 MG PO TABS
25.0000 mg | ORAL_TABLET | Freq: Every day | ORAL | Status: DC
  Administered 2022-12-03 – 2022-12-06 (×4): 25 mg via ORAL
  Filled 2022-12-02 (×5): qty 1

## 2022-12-02 MED ORDER — AMLODIPINE BESYLATE-VALSARTAN 5-320 MG PO TABS: 1.0000 | ORAL_TABLET | Freq: Every day | ORAL | Status: DC

## 2022-12-02 MED ORDER — INSULIN ASPART 100 UNIT/ML IJ SOLN: 0.0000 [IU] | INTRAMUSCULAR | Status: DC | PRN

## 2022-12-02 MED ORDER — BUPIVACAINE-EPINEPHRINE 0.25% -1:200000 IJ SOLN
INTRAMUSCULAR | Status: DC | PRN
  Administered 2022-12-02: 30 mL

## 2022-12-02 MED ORDER — PROPOFOL 10 MG/ML IV BOLUS
INTRAVENOUS | Status: DC | PRN
  Administered 2022-12-02: 130 mg via INTRAVENOUS

## 2022-12-02 MED ORDER — DEXAMETHASONE SODIUM PHOSPHATE 10 MG/ML IJ SOLN
INTRAMUSCULAR | Status: AC
  Filled 2022-12-02: qty 1

## 2022-12-02 MED ORDER — AMLODIPINE BESYLATE 5 MG PO TABS
5.0000 mg | ORAL_TABLET | Freq: Every day | ORAL | Status: DC
  Administered 2022-12-02 – 2022-12-06 (×5): 5 mg via ORAL
  Filled 2022-12-02 (×5): qty 1

## 2022-12-02 MED ORDER — SODIUM CHLORIDE 0.9 % IV BOLUS
1000.0000 mL | Freq: Once | INTRAVENOUS | Status: AC
  Administered 2022-12-02: 1000 mL via INTRAVENOUS

## 2022-12-02 MED ORDER — ANASTROZOLE 1 MG PO TABS
1.0000 mg | ORAL_TABLET | Freq: Every day | ORAL | Status: DC
  Administered 2022-12-03 – 2022-12-06 (×4): 1 mg via ORAL
  Filled 2022-12-02 (×4): qty 1

## 2022-12-02 MED ORDER — PANTOPRAZOLE SODIUM 40 MG PO TBEC
40.0000 mg | DELAYED_RELEASE_TABLET | Freq: Every day | ORAL | Status: DC
  Administered 2022-12-02 – 2022-12-06 (×5): 40 mg via ORAL
  Filled 2022-12-02 (×6): qty 1

## 2022-12-02 MED ORDER — IOHEXOL 350 MG/ML SOLN
75.0000 mL | Freq: Once | INTRAVENOUS | Status: AC | PRN
  Administered 2022-12-02: 75 mL via INTRAVENOUS

## 2022-12-02 MED ORDER — SUGAMMADEX SODIUM 200 MG/2ML IV SOLN
INTRAVENOUS | Status: DC | PRN
  Administered 2022-12-02: 299.2 mg via INTRAVENOUS
  Administered 2022-12-02: 300 mg via INTRAVENOUS

## 2022-12-02 MED ORDER — SODIUM CHLORIDE 0.9 % IV SOLN
2.0000 g | INTRAVENOUS | Status: DC
  Administered 2022-12-02 – 2022-12-04 (×3): 2 g via INTRAVENOUS
  Filled 2022-12-02 (×3): qty 20

## 2022-12-02 MED ORDER — PROPOFOL 10 MG/ML IV BOLUS
INTRAVENOUS | Status: AC
  Filled 2022-12-02: qty 20

## 2022-12-02 MED ORDER — FENTANYL CITRATE (PF) 250 MCG/5ML IJ SOLN
INTRAMUSCULAR | Status: DC | PRN
  Administered 2022-12-02: 50 ug via INTRAVENOUS
  Administered 2022-12-02: 100 ug via INTRAVENOUS

## 2022-12-02 MED ORDER — 0.9 % SODIUM CHLORIDE (POUR BTL) OPTIME
TOPICAL | Status: DC | PRN
  Administered 2022-12-02: 1000 mL

## 2022-12-02 MED ORDER — LIDOCAINE 2% (20 MG/ML) 5 ML SYRINGE
INTRAMUSCULAR | Status: AC
  Filled 2022-12-02: qty 5

## 2022-12-02 MED ORDER — BUPIVACAINE-EPINEPHRINE (PF) 0.25% -1:200000 IJ SOLN
INTRAMUSCULAR | Status: AC
  Filled 2022-12-02: qty 30

## 2022-12-02 MED ORDER — LACTATED RINGERS IV SOLN: INTRAVENOUS | Status: DC

## 2022-12-02 MED ORDER — ONDANSETRON HCL 4 MG/2ML IJ SOLN
INTRAMUSCULAR | Status: AC
  Filled 2022-12-02: qty 2

## 2022-12-02 MED ORDER — PHENYLEPHRINE 80 MCG/ML (10ML) SYRINGE FOR IV PUSH (FOR BLOOD PRESSURE SUPPORT)
PREFILLED_SYRINGE | INTRAVENOUS | Status: DC | PRN
  Administered 2022-12-02 (×2): 160 ug via INTRAVENOUS

## 2022-12-02 MED ORDER — INSULIN ASPART 100 UNIT/ML IJ SOLN
0.0000 [IU] | Freq: Three times a day (TID) | INTRAMUSCULAR | Status: DC
  Administered 2022-12-03: 5 [IU] via SUBCUTANEOUS
  Administered 2022-12-03: 3 [IU] via SUBCUTANEOUS
  Administered 2022-12-03: 8 [IU] via SUBCUTANEOUS
  Administered 2022-12-04 (×2): 5 [IU] via SUBCUTANEOUS
  Administered 2022-12-04: 3 [IU] via SUBCUTANEOUS
  Administered 2022-12-05: 5 [IU] via SUBCUTANEOUS
  Administered 2022-12-05: 3 [IU] via SUBCUTANEOUS
  Administered 2022-12-05: 5 [IU] via SUBCUTANEOUS
  Administered 2022-12-06: 3 [IU] via SUBCUTANEOUS

## 2022-12-02 MED ORDER — SODIUM CHLORIDE 0.9 % IR SOLN
Status: DC | PRN
  Administered 2022-12-02: 1000 mL

## 2022-12-02 MED ORDER — ALBUMIN HUMAN 5 % IV SOLN: INTRAVENOUS | Status: DC | PRN

## 2022-12-02 MED ORDER — INSULIN ASPART 100 UNIT/ML IJ SOLN
INTRAMUSCULAR | Status: AC
  Administered 2022-12-02: 4 [IU] via SUBCUTANEOUS
  Filled 2022-12-02: qty 1

## 2022-12-02 MED ORDER — LIDOCAINE 2% (20 MG/ML) 5 ML SYRINGE
INTRAMUSCULAR | Status: DC | PRN
  Administered 2022-12-02: 60 mg via INTRAVENOUS

## 2022-12-02 MED ORDER — SUCCINYLCHOLINE 20MG/ML (10ML) SYRINGE FOR MEDFUSION PUMP - OPTIME
INTRAMUSCULAR | Status: DC | PRN
  Administered 2022-12-02: 100 mg via INTRAVENOUS

## 2022-12-02 MED ORDER — FENTANYL CITRATE PF 50 MCG/ML IJ SOSY
50.0000 ug | PREFILLED_SYRINGE | Freq: Once | INTRAMUSCULAR | Status: AC
  Administered 2022-12-02: 50 ug via INTRAVENOUS
  Filled 2022-12-02: qty 1

## 2022-12-02 MED ORDER — ASPIRIN 81 MG PO TBEC
81.0000 mg | DELAYED_RELEASE_TABLET | Freq: Every day | ORAL | Status: DC
  Administered 2022-12-02 – 2022-12-06 (×5): 81 mg via ORAL
  Filled 2022-12-02 (×6): qty 1

## 2022-12-02 MED ORDER — SIMVASTATIN 20 MG PO TABS
40.0000 mg | ORAL_TABLET | Freq: Every day | ORAL | Status: DC
  Administered 2022-12-02: 40 mg via ORAL
  Filled 2022-12-02 (×2): qty 2

## 2022-12-02 MED ORDER — FENTANYL CITRATE (PF) 100 MCG/2ML IJ SOLN: 25.0000 ug | INTRAMUSCULAR | Status: DC | PRN

## 2022-12-02 MED ORDER — ROCURONIUM BROMIDE 10 MG/ML (PF) SYRINGE
PREFILLED_SYRINGE | INTRAVENOUS | Status: DC | PRN
  Administered 2022-12-02: 50 mg via INTRAVENOUS

## 2022-12-02 MED ORDER — CHLORHEXIDINE GLUCONATE 0.12 % MT SOLN: 15.0000 mL | Freq: Once | OROMUCOSAL | Status: AC

## 2022-12-02 MED ORDER — ENOXAPARIN SODIUM 40 MG/0.4ML IJ SOSY
40.0000 mg | PREFILLED_SYRINGE | INTRAMUSCULAR | Status: DC
  Administered 2022-12-03 – 2022-12-05 (×3): 40 mg via SUBCUTANEOUS
  Filled 2022-12-02 (×4): qty 0.4

## 2022-12-02 MED ORDER — FENTANYL CITRATE (PF) 250 MCG/5ML IJ SOLN
INTRAMUSCULAR | Status: AC
  Filled 2022-12-02: qty 5

## 2022-12-02 MED ORDER — IRBESARTAN 300 MG PO TABS
300.0000 mg | ORAL_TABLET | Freq: Every day | ORAL | Status: DC
  Administered 2022-12-02 – 2022-12-06 (×5): 300 mg via ORAL
  Filled 2022-12-02 (×5): qty 1

## 2022-12-02 MED ORDER — ROCURONIUM BROMIDE 10 MG/ML (PF) SYRINGE
PREFILLED_SYRINGE | INTRAVENOUS | Status: AC
  Filled 2022-12-02: qty 10

## 2022-12-02 MED ORDER — ONDANSETRON HCL 4 MG/2ML IJ SOLN
4.0000 mg | Freq: Once | INTRAMUSCULAR | Status: AC
  Administered 2022-12-02: 4 mg via INTRAVENOUS
  Filled 2022-12-02: qty 2

## 2022-12-02 MED ORDER — ORAL CARE MOUTH RINSE: 15.0000 mL | Freq: Once | OROMUCOSAL | Status: AC

## 2022-12-02 MED ORDER — CHLORHEXIDINE GLUCONATE 0.12 % MT SOLN
OROMUCOSAL | Status: AC
  Administered 2022-12-02: 15 mL via OROMUCOSAL
  Filled 2022-12-02: qty 15

## 2022-12-02 MED ORDER — METRONIDAZOLE 500 MG/100ML IV SOLN
500.0000 mg | Freq: Two times a day (BID) | INTRAVENOUS | Status: DC
  Administered 2022-12-02 – 2022-12-05 (×6): 500 mg via INTRAVENOUS
  Filled 2022-12-02 (×6): qty 100

## 2022-12-02 MED ORDER — INSULIN ASPART 100 UNIT/ML IJ SOLN
0.0000 [IU] | Freq: Every day | INTRAMUSCULAR | Status: DC
  Administered 2022-12-03: 3 [IU] via SUBCUTANEOUS
  Administered 2022-12-04: 2 [IU] via SUBCUTANEOUS
  Administered 2022-12-05: 5 [IU] via SUBCUTANEOUS

## 2022-12-02 MED ORDER — ONDANSETRON HCL 4 MG/2ML IJ SOLN
INTRAMUSCULAR | Status: DC | PRN
  Administered 2022-12-02: 4 mg via INTRAVENOUS

## 2022-12-02 SURGICAL SUPPLY — 52 items
APL PRP STRL LF DISP 70% ISPRP (MISCELLANEOUS) ×1
APPLIER CLIP 5 13 M/L LIGAMAX5 (MISCELLANEOUS)
APR CLP MED LRG 5 ANG JAW (MISCELLANEOUS)
BAG COUNTER SPONGE SURGICOUNT (BAG) ×1 IMPLANT
BLADE CLIPPER SURG (BLADE) IMPLANT
CANISTER SUCT 3000ML PPV (MISCELLANEOUS) ×1 IMPLANT
CHLORAPREP W/TINT 26 (MISCELLANEOUS) ×1 IMPLANT
CLIP APPLIE 5 13 M/L LIGAMAX5 (MISCELLANEOUS) IMPLANT
COVER SURGICAL LIGHT HANDLE (MISCELLANEOUS) ×1 IMPLANT
CUTTER FLEX LINEAR 45M (STAPLE) ×1 IMPLANT
DERMABOND ADVANCED .7 DNX12 (GAUZE/BANDAGES/DRESSINGS) ×1 IMPLANT
ELECT REM PT RETURN 9FT ADLT (ELECTROSURGICAL) ×1
ELECTRODE REM PT RTRN 9FT ADLT (ELECTROSURGICAL) ×1 IMPLANT
GLOVE BIOGEL PI IND STRL 8 (GLOVE) IMPLANT
GLOVE INDICATOR 6.5 STRL GRN (GLOVE) ×1 IMPLANT
GLOVE SURG ENC MOIS LTX SZ7.5 (GLOVE) ×1 IMPLANT
GLOVE SURG UNDER LTX SZ8 (GLOVE) ×1 IMPLANT
GOWN STRL REUS W/ TWL LRG LVL3 (GOWN DISPOSABLE) ×2 IMPLANT
GOWN STRL REUS W/ TWL XL LVL3 (GOWN DISPOSABLE) ×1 IMPLANT
GOWN STRL REUS W/TWL LRG LVL3 (GOWN DISPOSABLE) ×2
GOWN STRL REUS W/TWL XL LVL3 (GOWN DISPOSABLE) ×1
GRASPER SUT TROCAR 14GX15 (MISCELLANEOUS) ×1 IMPLANT
IRRIG SUCT STRYKERFLOW 2 WTIP (MISCELLANEOUS) ×1
IRRIGATION SUCT STRKRFLW 2 WTP (MISCELLANEOUS) ×1 IMPLANT
KIT BASIN OR (CUSTOM PROCEDURE TRAY) ×1 IMPLANT
KIT TURNOVER KIT B (KITS) ×1 IMPLANT
NDL 22X1.5 STRL (OR ONLY) (MISCELLANEOUS) ×1 IMPLANT
NDL INSUFFLATION 14GA 120MM (NEEDLE) ×1 IMPLANT
NEEDLE 22X1.5 STRL (OR ONLY) (MISCELLANEOUS) ×1 IMPLANT
NEEDLE INSUFFLATION 14GA 120MM (NEEDLE) ×1 IMPLANT
NS IRRIG 1000ML POUR BTL (IV SOLUTION) ×1 IMPLANT
PAD ARMBOARD 7.5X6 YLW CONV (MISCELLANEOUS) ×2 IMPLANT
RELOAD 45 VASCULAR/THIN (ENDOMECHANICALS) ×1 IMPLANT
RELOAD STAPLE 45 2.5 WHT GRN (ENDOMECHANICALS) IMPLANT
RELOAD STAPLE 45 3.5 BLU ETS (ENDOMECHANICALS) IMPLANT
RELOAD STAPLE TA45 3.5 REG BLU (ENDOMECHANICALS) IMPLANT
SCISSORS LAP 5X35 DISP (ENDOMECHANICALS) IMPLANT
SET TUBE SMOKE EVAC HIGH FLOW (TUBING) ×1 IMPLANT
SHEARS HARMONIC ACE PLUS 36CM (ENDOMECHANICALS) ×1 IMPLANT
SLEEVE Z-THREAD 5X100MM (TROCAR) ×1 IMPLANT
SPECIMEN JAR SMALL (MISCELLANEOUS) ×1 IMPLANT
SUT MNCRL AB 4-0 PS2 18 (SUTURE) ×1 IMPLANT
SYS BAG RETRIEVAL 10MM (BASKET) ×1
SYSTEM BAG RETRIEVAL 10MM (BASKET) ×1 IMPLANT
TOWEL GREEN STERILE FF (TOWEL DISPOSABLE) ×1 IMPLANT
TRAY FOLEY W/BAG SLVR 16FR (SET/KITS/TRAYS/PACK) ×1
TRAY FOLEY W/BAG SLVR 16FR ST (SET/KITS/TRAYS/PACK) ×1 IMPLANT
TRAY LAPAROSCOPIC MC (CUSTOM PROCEDURE TRAY) ×1 IMPLANT
TROCAR XCEL NON-BLD 5MMX100MML (ENDOMECHANICALS) ×1 IMPLANT
TROCAR Z THREAD OPTICAL 12X100 (TROCAR) ×1 IMPLANT
WARMER LAPAROSCOPE (MISCELLANEOUS) ×1 IMPLANT
WATER STERILE IRR 1000ML POUR (IV SOLUTION) ×1 IMPLANT

## 2022-12-02 NOTE — Discharge Instructions (Signed)
Please go to ER for further work up  

## 2022-12-02 NOTE — ED Triage Notes (Signed)
Pt states woke up at 1am with constant upper abdominal pain around to back. Denies N/V/D. Last NBM was yesterday. Denies taking any meds for pain. Denies urinary sx's.

## 2022-12-02 NOTE — ED Provider Notes (Signed)
[place] [facility] AT [facility] Provider Note   CSN: [id number] Arrival date & time: 12/02/22  1108     History  Chief Complaint  Patient presents with   Abdominal Pain   Back Pain    Gloria Mckinney is a 77 y.o. female.  77 y.o female with a PMH of HTN, DM presents to the ED with a chief complaint of abdominal pain which began yesterday. Described as a cramping stabbing sensation to the right side of her abdomen. Symptoms are worsen with oral intake along with ambulation. She was evaluated at [facility], and sent to the ED for further workup. He has not taken any medication for improvement in symptoms. Last bowel movement was yesterday am without any blood in her stool. She does not go regularly. Denies any fever, nausea, vomiting or prior surgical hx of the abdomen.   The history is provided by the patient.  Abdominal Pain Pain location:  RUQ and RLQ Pain quality: cramping and sharp   Pain radiates to:  Does not radiate Pain severity:  Moderate Onset quality:  Sudden Duration:  1 day Timing:  Constant Progression:  Worsening Chronicity:  New Context: not alcohol use, not diet changes, not previous surgeries, not recent illness, not sick contacts and not suspicious food intake   Associated symptoms: no chest pain, no chills, no diarrhea, no fever, no nausea, no shortness of breath, no sore throat and no vomiting   Back Pain Associated symptoms: abdominal pain   Associated symptoms: no chest pain and no fever        Home Medications Prior to Admission medications   Medication Sig Start Date End Date Taking? Authorizing Provider  anastrozole (ARIMIDEX) 1 MG tablet Take 1 tablet by mouth once daily 11/03/22   Loa Socks, NP  aspirin EC 81 MG tablet Take 81 mg by mouth daily.    [provider]  Calcium Carb-Cholecalciferol (CALCIUM + VITAMIN D3 PO) Take 1 tablet by mouth daily.    [provider]   Empagliflozin-metFORMIN HCl (SYNJARDY) 12.12-998 MG TABS Take 1 tablet by mouth 2 (two) times daily.    [provider]  EXFORGE 5-320 MG tablet Take 1 tablet by mouth daily. 09/14/16   [provider]  hydrochlorothiazide (HYDRODIURIL) 25 MG tablet Take 25 mg by mouth daily. 09/22/16   [provider]  omeprazole (PRILOSEC) 40 MG capsule Take 40 mg by mouth every morning.  09/14/16   [provider]  simvastatin (ZOCOR) 40 MG tablet Take 40 mg by mouth daily. 09/24/16   [provider]      Allergies    Penicillins, Cephalexin, and Statins    Review of Systems   Review of Systems  Constitutional:  Negative for chills and fever.  HENT:  Negative for sore throat.   Respiratory:  Negative for shortness of breath.   Cardiovascular:  Negative for chest pain.  Gastrointestinal:  Positive for abdominal pain. Negative for blood in stool, diarrhea, nausea and vomiting.  Genitourinary:  Negative for flank pain.  Musculoskeletal:  Positive for back pain.  All other systems reviewed and are negative.   Physical Exam Updated Vital Signs BP (!) 175/86   Pulse (!) 111   Temp 98.9 F (37.2 C) (Oral)   Resp (!) 21   SpO2 99%  Physical Exam Vitals and nursing note reviewed.  Constitutional:      Appearance: She is well-developed.  HENT:     Head: Normocephalic and atraumatic.  Cardiovascular:     Rate and Rhythm: Tachycardia present.  Pulmonary:     Effort: Pulmonary effort is normal.     Breath sounds: No wheezing.  Abdominal:     General: Abdomen is flat. Bowel sounds are decreased.     Palpations: Abdomen is soft.     Tenderness: There is abdominal tenderness in the right upper quadrant and right lower quadrant. There is rebound. There is no right CVA tenderness, left CVA tenderness or guarding.     Hernia: No hernia is present.  Skin:    General: Skin is warm and dry.  Neurological:     Mental Status: She is alert and oriented to person,  place, and time.     ED Results / Procedures / Treatments   Labs (all labs ordered are listed, but only abnormal results are displayed) Labs Reviewed  LACTIC ACID, PLASMA - Abnormal; Notable for the following components:      Result Value   Lactic Acid, Venous 3.0 (*)    All other components within normal limits  LACTIC ACID, PLASMA - Abnormal; Notable for the following components:   Lactic Acid, Venous 3.1 (*)    All other components within normal limits  COMPREHENSIVE METABOLIC PANEL - Abnormal; Notable for the following components:   Sodium 131 (*)    Chloride 95 (*)    Glucose, Bld 208 (*)    All other components within normal limits  CBC WITH DIFFERENTIAL/PLATELET - Abnormal; Notable for the following components:   WBC 19.9 (*)    Neutro Abs 18.0 (*)    Lymphs Abs 0.6 (*)    Monocytes Absolute 1.2 (*)    Abs Immature Granulocytes 0.10 (*)    All other components within normal limits  CBG MONITORING, ED - Abnormal; Notable for the following components:   Glucose-Capillary 202 (*)    All other components within normal limits  RESP PANEL BY RT-PCR (RSV, FLU A&B, COVID)  RVPGX2  CULTURE, BLOOD (ROUTINE X 2)  CULTURE, BLOOD (ROUTINE X 2)  PROTIME-INR  APTT    EKG None  Radiology CT ABDOMEN PELVIS W CONTRAST  Result Date: 12/02/2022 CLINICAL DATA:  Abdominal pain, neutropenia EXAM: CT ABDOMEN AND PELVIS WITH CONTRAST TECHNIQUE: Multidetector CT imaging of the abdomen and pelvis was performed using the standard protocol following bolus administration of intravenous contrast. RADIATION DOSE REDUCTION: This exam was performed according to the departmental dose-optimization program which includes automated exposure control, adjustment of the mA and/or kV according to patient size and/or use of iterative reconstruction technique. CONTRAST:  75mL OMNIPAQUE IOHEXOL 350 MG/ML SOLN COMPARISON:  12/02/2022, 08/29/2010 FINDINGS: Lower chest: No acute pleural or parenchymal lung  disease. Chronic stable benign right breast lesion previously evaluated by multiple mammograms. Hepatobiliary: Portions of the dome of the liver are excluded by slice selection. The liver is unremarkable. Gallbladder is moderately distended without evidence of cholelithiasis or cholecystitis. Pancreas: Unremarkable. No pancreatic ductal dilatation or surrounding inflammatory changes. Spleen: Normal in size without focal abnormality. Adrenals/Urinary Tract: Adrenal glands are unremarkable. Kidneys are normal, without renal calculi, focal lesion, or hydronephrosis. Bladder is unremarkable. Stomach/Bowel: There is no evidence of high-grade small bowel obstruction. Mild distension of the proximal jejunum is noted, with proximal jejunal wall thickening. This is nonspecific, and could be related to peristalsis. Underlying enteritis and regional ileus cannot be excluded. The appendix is identified in the sub hepatic region, and is distended measuring up to 12 mm in thickness with mild mural thickening noted. No periappendiceal  fat stranding. Findings are suspicious but not definitive for acute appendicitis. There is a small hiatal hernia. Vascular/Lymphatic: Aortic atherosclerosis. No enlarged abdominal or pelvic lymph nodes. Reproductive: The uterus is surgically absent. There are no adnexal masses. Other: No free fluid or free intraperitoneal gas. No abdominal wall hernia. Musculoskeletal: No acute or destructive bony lesions. Reconstructed images demonstrate no additional findings. IMPRESSION: 1. Dilated appendix with mild mural thickening. There are no surrounding periappendiceal inflammatory changes however. Findings are consistent with, but not diagnostic of, acute appendicitis. Please correlate with physical exam and laboratory findings. 2. Mild distension of the proximal jejunum with associated jejunal wall thickening. While this could be due to peristalsis, underlying enteritis and regional ileus cannot be  excluded. 3. Small hiatal hernia. 4.  Aortic Atherosclerosis (ICD10-I70.0). Electronically Signed   By: Sharlet Salina M.D.   On: 12/02/2022 17:04   US Abdomen Limited  Result Date: 12/02/2022 CLINICAL DATA:  Acute right upper quadrant abdominal pain. EXAM: ULTRASOUND ABDOMEN LIMITED RIGHT UPPER QUADRANT COMPARISON:  None Available. FINDINGS: Gallbladder: No gallstones or wall thickening visualized. No sonographic Murphy sign noted by sonographer. Common bile duct: Diameter: 5 mm which is within normal limits. Liver: No focal lesion identified. Mildly increased echogenicity of hepatic parenchyma is noted suggesting possible hepatic steatosis. Portal vein is patent on color Doppler imaging with normal direction of blood flow towards the liver. Other: None. IMPRESSION: Possible hepatic steatosis. No other abnormality seen in the right upper quadrant of the abdomen. Electronically Signed   By: Lupita Raider M.D.   On: 12/02/2022 15:28   DG Chest 1 View  Result Date: 12/02/2022 CLINICAL DATA:  Abdominal pain.  Sepsis. EXAM: CHEST  1 VIEW COMPARISON:  08/15/2018 FINDINGS: Previously seen power port has been removed. Heart size is normal. Mediastinal shadows are normal. Patient has not taken a deep inspiration, but allowing for that, the lungs are clear. No consolidation, collapse, edema or effusion. Old surgery in the right shoulder with radiopaque anchors. Distal clavicular resections. IMPRESSION: No active disease. Previous power port has been removed. Electronically Signed   By: Paulina Fusi M.D.   On: 12/02/2022 13:05    Procedures Procedures    Medications Ordered in ED Medications  cefTRIAXone (ROCEPHIN) 2 g in sodium chloride 0.9 % 100 mL IVPB ( Intravenous Automatically Held 12/10/22 1500)  metroNIDAZOLE (FLAGYL) IVPB 500 mg ( Intravenous Automatically Held 12/10/22 1500)  chlorhexidine (PERIDEX) 0.12 % solution (has no administration in time range)  sodium chloride 0.9 % bolus 1,000 mL (0 mLs  Intravenous Stopped 12/02/22 1605)  ondansetron (ZOFRAN) injection 4 mg (4 mg Intravenous Given 12/02/22 1511)  fentaNYL (SUBLIMAZE) injection 50 mcg (50 mcg Intravenous Given 12/02/22 1512)  iohexol (OMNIPAQUE) 350 MG/ML injection 75 mL (75 mLs Intravenous Contrast Given 12/02/22 1654)    ED Course/ Medical Decision Making/ A&P Clinical Course as of 12/02/22 1801  Wed Dec 02, 2022  1734 WBC(!): 19.9 [JS]  1734 Lactic Acid, Venous(!!): 3.1 [JS]    Clinical Course User Index [JS] Claude Manges, PA-C                             Medical Decision Making Amount and/or Complexity of Data Reviewed Labs: ordered. Decision-making details documented in ED Course. Radiology: ordered.  Risk Prescription drug management.    This patient presents to the ED for concern of abdominal pain, this involves a number of treatment options, and is a complaint that carries  with it a high risk of complications and morbidity.  The differential diagnosis includes cholecystitis, appendicitis, bowel obstruction versus ileus.    Co morbidities: Discussed in HPI   Brief History:  See HPI.   EMR reviewed including pt PMHx, past surgical history and past visits to ER.   See HPI for more details   Lab Tests:  I ordered and independently interpreted labs.  The pertinent results include:    Labs notable for CBC with a leukocytosis of 19.9, hemoglobin is stable.  CMP with slight decrease in her sodium, elevated glucose however underlying diabetes. LFTs are within normal limits. Lactic acid is elevated at 3.0   Imaging Studies:  CT Abdomen and pelvis showed: 1. Dilated appendix with mild mural thickening. There are no  surrounding periappendiceal inflammatory changes however. Findings  are consistent with, but not diagnostic of, acute appendicitis.  Please correlate with physical exam and laboratory findings.  2. Mild distension of the proximal jejunum with associated jejunal  wall thickening. While  this could be due to peristalsis, underlying  enteritis and regional ileus cannot be excluded.  3. Small hiatal hernia.  4.  Aortic Atherosclerosis (ICD10-I70.0).   Chest xray without any acute findings.  Medicines ordered:  I ordered medication including zofran, bolus, fentanyl  for symptomatic control Reevaluation of the patient after these medicines showed that the patient stayed the same I have reviewed the patients home medicines and have made adjustments as needed  Critical Interventions:  Due to elevated white blood cell count, positive lactic acid, with tachycardia patient was started on prophylactic antibiotics such as ceftriaxone and Flagyl to cover for intra-abdominal pathology.   Consults:  I requested consultation with general surgery,  and discussed lab and imaging findings as well as pertinent plan - they will evaluate patient while in the ED.   Reevaluation:  After the interventions noted above I re-evaluated patient and found that they have :stayed the same  Social Determinants of Health:  The patient's social determinants of health were a factor in the care of this patient  Problem List / ED Course:  Patient presented to the ED with a chief complaint of right abdominal pain which began yesterday, evaluated urgent care this morning and sent here for further evaluation.  Has not had any oral intake since last night.  Arrived to the ED tachycardic with a heart rate in the 1 teens, blood work was obtained in triage remarkable for a white blood cell count of 19.9, lactic acid is 3.0.  The rest of her labs are within normal limits.  She does have some pain along the right upper quadrant, there is a slight delay on her CT imaging at this time, some concern for gallbladder pathology.  Gallbladder was ultrasound without any acute findings noted.  She was given antibiotics prophylactically, however due to her allergy to penicillin she was given Rocephin, Flagyl. CT imaging  remarkable for appendicitis, discussed this case with general surgery Dr. Dossie Der on call who will evaluate patient in the ED.  Patient has been transported to the OR for appendectomy.   Dispostion:  After consideration of the diagnostic results and the patients response to treatment, I feel that the patent would benefit from intervention of her appendicitis.    Portions of this note were generated with Scientist, clinical (histocompatibility and immunogenetics). Dictation errors may occur despite best attempts at proofreading.   Final Clinical Impression(s) / ED Diagnoses Final diagnoses:  Acute appendicitis, unspecified acute appendicitis type    Rx /  DC Orders ED Discharge Orders     None         Claude Manges, PA-C 12/02/22 1801    Glyn Ade, MD 12/05/22 [phone]

## 2022-12-02 NOTE — Op Note (Signed)
Patient: Gloria Mckinney ([birth year], [id number])  Date of Surgery: 12/02/2022   Preoperative Diagnosis: Appendicitis   Postoperative Diagnosis: Appendicitis   Surgical Procedure: APPENDECTOMY LAPAROSCOPIC:    Operative Team Members:  Surgeon(s) and Role:    * Tennille Montelongo, Hyman Hopes, MD - Primary   Anesthesiologist: Atilano Median, DO CRNA: Aundria Rud, CRNA   Anesthesia: General   Fluids:  Total I/O In: 250 [IV Piggyback:250] Out: -   Complications: None  Drains:  none   Specimen:  ID Type Source Tests Collected by Time Destination  1 : Appendix Tissue PATH Appendix SURGICAL PATHOLOGY Melanni Benway, Hyman Hopes, MD 12/02/2022 1938      Disposition:  PACU - hemodynamically stable.  Plan of Care: Admit for overnight observation    Indications for Procedure: Gloria Mckinney is a 77 y.o. female who presented with abdominal pain.  History, physical and imaging was concerning for appendicitis, so laparoscopic appendectomy was recommended for the patient.  The procedure itself, as well as the risks, benefits and alternatives were discussed with the patient.  Risks discussed included but were not limited to the risk of bleeding, infection, damage to nearby structures, need to convert to open procedure, incisional hernia, and the need for additional procedures or surgeries.  With this discussion complete and all questions answered the patient granted consent to proceed.  Findings: Inflamed appendix  Infection status: Patient: [facility] Patient Case: Urgent Infection Present At Time Of Surgery (PATOS):  Inflamed appendix   Description of Procedure:   On the date stated above, the patient was taken to the operating room suite and placed in supine positioning with the left arm tucked.  Sequential compression devices were placed on the lower extremities to prevent blood clots.  General endotracheal anesthesia was  induced.  The patient urinated just prior to surgery so a foley catheter was not placed.  Preoperative antibiotics were given.  The patient's abdomen was prepped and draped in the usual sterile fashion.  A time-out was completed verifying the correct patient, procedure, positioning and equipment needed for the case.  We began by anesthetizing the skin with local anesthetic and then making a 5 mm incision just below the umbilicus.  We dissected through the subcutaneous tissues to the fascia.  The fascia was grasped and elevated using a Kocher clamp.  A Veress needle was inserted into the abdomen and the abdomen was insufflated to 15 mmHg.  A 5 mm trocar was inserted in this position under optical guidance and then the abdomen was inspected.  There was no trauma to the underlying viscera with initial trocar placement.  Any abnormal findings, other than inflammation in the right lower quadrant, are listed above in the findings section.  Two additional trocars were placed, one 5 mm trocar suprapubically and one 12 mm trocar in the left lower quadrant.  These were placed under direct vision without any trauma to the underlying viscera.    The patient was then placed in head down, left side down positioning.  The appendix was identified and dissected free from its attachments to the abdominal wall, small intestine and cecum.  A window was created in the mesoappendix using blunt dissection.  We used one 45mm white load of the stapler to divide the appendix off the cecum.  Then, the harmonic scalpel was used to divide the mesoappendix.  The appendix was placed in an endocatch bag and removed through the 12 mm port site in the left lower  quadrant.  A suction irrigator was used to clean the operative field. The staple line was well formed.  There was good hemostasis at the end of the case.  At this point we directed our attention to closure.  The patient was moved back to a level position.  The 12 mm trocar site was  closed at the fascial level using an 0-vicryl on a fascial suture passer.  The abdomen was desufflated.  The skin was closed using 4-0 Monocryl and dermabond.  All sponge and needle counts were correct at the end of the case.    Ivar Drape, MD General, Bariatric, & Minimally Invasive Surgery [facility], [place]

## 2022-12-02 NOTE — Anesthesia Procedure Notes (Addendum)
Procedure Name: Intubation Date/Time: 12/02/2022 7:03 PM  Performed by: Aundria Rud, CRNAPre-anesthesia Checklist: Patient identified, Emergency Drugs available, Suction available and Patient being monitored Patient Re-evaluated:Patient Re-evaluated prior to induction Oxygen Delivery Method: Circle System Utilized Preoxygenation: Pre-oxygenation with 100% oxygen Induction Type: IV induction, Cricoid Pressure applied and Rapid sequence Ventilation: Mask ventilation without difficulty Laryngoscope Size: Miller and 2 Grade View: Grade I Tube type: Oral Tube size: 7.0 mm Number of attempts: 1 Airway Equipment and Method: Stylet and Oral airway Placement Confirmation: ETT inserted through vocal cords under direct vision, positive ETCO2 and breath sounds checked- equal and bilateral Secured at: 21 cm Tube secured with: Tape Dental Injury: Teeth and Oropharynx as per pre-operative assessment  Comments: MAC 3 with grade 2b view and placed in esophagus. Removed and masked with gas and switched to [facility] blade with grade 1 view and upon intubation light flickering on and off. While getting another handle Dr. Nance Pew able to intubate with flickering on and off with grade 1 view.

## 2022-12-02 NOTE — ED Provider Notes (Signed)
[facility] CARE CENTER   [id number] 12/02/22 Arrival Time: 0900  ASSESSMENT & PLAN:  1. Right lower quadrant abdominal pain    -77 year old female with history of breast cancer and diabetes comes to urgent care to be evaluated for abdominal pain.  She is not well-appearing.  She is tachycardic.  She is very tender to palpation at the right upper and right lower quadrant.  I discussed with the patient and her family member that my recommendation is for her to go to the emergency department to get a CAT scan to rule out intra-abdominal pathology such as cholecystitis, appendicitis, obstruction, diverticulitis, etc.  I think dissection is less likely.  They voiced understanding and will transport to the ER by private vehicle.  All questions were answered and they agree to plan.  This is an undiagnosed acute problem with uncertain prognosis and independent historian from family member who was not the patient  No orders of the defined types were placed in this encounter.    Discharge Instructions      Please go to ER for further work up        Reviewed expectations re: course of current medical issues. Questions answered. Outlined signs and symptoms indicating need for more acute intervention. Patient verbalized understanding. After Visit Summary given.   SUBJECTIVE: Pleasant 77 year old female comes urgent care with a family member to be evaluated for abdominal pain.  Family member helps provide most of the history.  Patient woke up at 1 AM in the night with severe abdominal pain that started in the back and radiates around to the front.  It started as generalized but is now concentrating on the right upper and right lower quadrants.  She was unable to sleep.  That has made her quite uncomfortable.  She has not taken any medicines.  She denies any recent illness.  No fevers.  No nausea/vomiting or diarrhea.  Last bowel movement was yesterday.  No pain with urination.  No history of  prior abdominal surgeries.  Does have history of diabetes and breast cancer.  No LMP recorded. Patient has had a hysterectomy. Past Surgical History:  Procedure Laterality Date   BREAST BIOPSY Bilateral 2000   benign   BREAST LUMPECTOMY WITH RADIOACTIVE SEED AND SENTINEL LYMPH NODE BIOPSY Left 08/15/2018   Procedure: LEFT BREAST LUMPECTOMY WITH RADIOACTIVE SEED AND LEFT AXILLARY DEEP SENTINEL LYMPH NODE BIOPSY INJECT BLUE DYE LEFT BREAST;  Surgeon: Claud Kelp, MD;  Location: [facility];  Service: General;  Laterality: Left;   PORT-A-CATH REMOVAL Right 08/09/2019   Procedure: REMOVAL PORT-A-CATH;  Surgeon: Claud Kelp, MD;  Location: [facility];  Service: General;  Laterality: Right;   PORTACATH PLACEMENT Right 08/15/2018   Procedure: INSERTION PORT-A-CATH WITH ULTRASOUND;  Surgeon: Claud Kelp, MD;  Location: [facility];  Service: General;  Laterality: Right;   SHOULDER ARTHROSCOPY Right 06-16-2002   dr graves   SHOULDER OPEN ROTATOR CUFF REPAIR Right 07-28-2000    dr graves   VAGINAL HYSTERECTOMY  1992   with BSO   WRIST SURGERY  2007     OBJECTIVE:  Vitals:   12/02/22 1026  BP: (!) 153/88  Pulse: (!) 114  Resp: 20  Temp: 99.1 F (37.3 C)  TempSrc: Oral  SpO2: 95%     Physical Exam Vitals and nursing note reviewed.  Constitutional:      Appearance: She is ill-appearing.  Cardiovascular:     Rate and Rhythm: Tachycardia present.  Pulmonary:  Effort: Pulmonary effort is normal.  Abdominal:     General: Abdomen is flat. Bowel sounds are normal. There is no distension.     Tenderness: There is generalized abdominal tenderness and tenderness in the right upper quadrant and right lower quadrant. There is no right CVA tenderness or left CVA tenderness.  Skin:    General: Skin is warm.  Neurological:     General: No focal deficit present.     Mental Status: She is alert.  Psychiatric:        Mood and Affect: Mood  normal.      Labs: Results for orders placed or performed during the hospital encounter of 07/18/18  Urinalysis, Routine w reflex microscopic  Result Value Ref Range   Color, Urine STRAW (A) YELLOW   APPearance CLEAR CLEAR   Specific Gravity, Urine 1.011 1.005 - 1.030   pH 7.0 5.0 - 8.0   Glucose, UA >=500 (A) NEGATIVE mg/dL   Hgb urine dipstick NEGATIVE NEGATIVE   Bilirubin Urine NEGATIVE NEGATIVE   Ketones, ur NEGATIVE NEGATIVE mg/dL   Protein, ur NEGATIVE NEGATIVE mg/dL   Nitrite NEGATIVE NEGATIVE   Leukocytes, UA NEGATIVE NEGATIVE   RBC / HPF 0-5 0 - 5 RBC/hpf   WBC, UA 0-5 0 - 5 WBC/hpf   Bacteria, UA NONE SEEN NONE SEEN   Labs Reviewed - No data to display  Imaging: No results found.   Allergies  Allergen Reactions   Penicillins Swelling and Other (See Comments)    Has patient had a PCN reaction causing immediate rash, facial/tongue/throat swelling, SOB or lightheadedness with hypotension: Yes Has patient had a PCN reaction causing severe rash involving mucus membranes or skin necrosis: No Has patient had a PCN reaction that required hospitalization: No Has patient had a PCN reaction occurring within the last 10 years: No If all of the above answers are "NO", then may proceed with Cephalosporin use.    Cephalexin Itching   Statins Other (See Comments)    MYALGIAS                                               Past Medical History:  Diagnosis Date   Cancer    GERD (gastroesophageal reflux disease)    History of radiation therapy 01/16/19- 02/13/19   left breast 15 fractions of 2.67 Gy for a total of 40.05 Gy. Left breast boost 5 fractions of 2 Gy for a total of 10 Gy   Hypertension    Mixed hyperlipidemia    OA (osteoarthritis)    knee right   Peripheral neuropathy    PVD (peripheral vascular disease)    Type 2 diabetes mellitus treated with insulin    endocrinologist--- dr Armen Pickup [place]    Social History   Socioeconomic History   Marital status: Married     Spouse name: Not on file   Number of children: 4   Years of education: Not on file   Highest education level: Not on file  Occupational History   Not on file  Tobacco Use   Smoking status: Never   Smokeless tobacco: Never  Vaping Use   Vaping Use: Never used  Substance and Sexual Activity   Alcohol use: Never   Drug use: Never   Sexual activity: Not on file  Other Topics Concern   Not on file  Social History Narrative  Lives with husband and son.     Social Determinants of Health   Financial Resource Strain: Not on file  Food Insecurity: Not on file  Transportation Needs: No Transportation Needs (12/28/2018)   PRAPARE - Administrator, Civil Service (Medical): No    Lack of Transportation (Non-Medical): No  Physical Activity: Not on file  Stress: Not on file  Social Connections: Not on file  Intimate Partner Violence: Not on file    Family History  Problem Relation Age of Onset   Hypertension Mother    CVA Mother    Heart disease Mother    Asthma Father    Hypertension Sister    Diabetes Sister    Heart disease Brother    Hypertension Sister    Diabetes Sister    Hypertension Sister    Diabetes Sister    Healthy Son    Healthy Daughter    Healthy Daughter    Healthy Daughter    Breast cancer Neg Hx       Virl Coble, Baldemar Friday, MD 12/02/22 1100

## 2022-12-02 NOTE — ED Notes (Signed)
Patient is being discharged from the Urgent Care and sent to the Emergency Department via POV . Per Dr. Jordan Hawks, patient is in need of higher level of care due to further evaluation on abdominal pain. Patient is aware and verbalizes understanding of plan of care.  Vitals:   12/02/22 1026  BP: (!) 153/88  Pulse: (!) 114  Resp: 20  Temp: 99.1 F (37.3 C)  SpO2: 95%

## 2022-12-02 NOTE — Anesthesia Preprocedure Evaluation (Signed)
Anesthesia Evaluation  Patient identified by MRN, date of birth, ID band Patient awake    Reviewed: Allergy & Precautions, NPO status , Patient's Chart, lab work & pertinent test results  Airway Mallampati: II  TM Distance: >3 FB Neck ROM: Full    Dental no notable dental hx.    Pulmonary neg pulmonary ROS   Pulmonary exam normal        Cardiovascular hypertension, + Peripheral Vascular Disease   Rhythm:Regular Rate:Normal     Neuro/Psych negative neurological ROS  negative psych ROS   GI/Hepatic Neg liver ROS,GERD  Medicated,,  Endo/Other  diabetes, Type 2    Renal/GU negative Renal ROS  negative genitourinary   Musculoskeletal  (+) Arthritis , Osteoarthritis,    Abdominal Normal abdominal exam  (+)   Peds  Hematology Lab Results      Component                Value               Date                      WBC                      19.9 (H)            12/02/2022                HGB                      13.1                12/02/2022                HCT                      38.6                12/02/2022                MCV                      82.7                12/02/2022                PLT                      251                 12/02/2022             Lab Results      Component                Value               Date                      NA                       131 (L)             12/02/2022                K  3.7                 12/02/2022                CO2                      23                  12/02/2022                GLUCOSE                  208 (H)             12/02/2022                BUN                      14                  12/02/2022                CREATININE               0.88                12/02/2022                CALCIUM                  9.9                 12/02/2022                GFRNONAA                 >60                 12/02/2022              Anesthesia Other  Findings   Reproductive/Obstetrics                             Anesthesia Physical Anesthesia Plan  ASA: 2 and emergent  Anesthesia Plan: General   Post-op Pain Management:    Induction: Intravenous and Rapid sequence  PONV Risk Score and Plan: 3 and Ondansetron, Dexamethasone and Treatment may vary due to age or medical condition  Airway Management Planned: Mask and Oral ETT  Additional Equipment: None  Intra-op Plan:   Post-operative Plan: Extubation in OR  Informed Consent: I have reviewed the patients History and Physical, chart, labs and discussed the procedure including the risks, benefits and alternatives for the proposed anesthesia with the patient or authorized representative who has indicated his/her understanding and acceptance.       Plan Discussed with: CRNA  Anesthesia Plan Comments:        Anesthesia Quick Evaluation

## 2022-12-02 NOTE — Anesthesia Postprocedure Evaluation (Signed)
Anesthesia Post Note  Patient: Gloria Mckinney  Procedure(s) Performed: APPENDECTOMY LAPAROSCOPIC     Patient location during evaluation: PACU Anesthesia Type: General Level of consciousness: awake and alert Pain management: pain level controlled Vital Signs Assessment: post-procedure vital signs reviewed and stable Respiratory status: spontaneous breathing, nonlabored ventilation, respiratory function stable and patient connected to nasal cannula oxygen Cardiovascular status: blood pressure returned to baseline and stable Postop Assessment: no apparent nausea or vomiting Anesthetic complications: no   No notable events documented.  Last Vitals:  Vitals:   12/02/22 2040 12/02/22 2101  BP:  130/66  Pulse: (!) 105 (!) 105  Resp: (!) 22 20  Temp: 37.6 C 37.4 C  SpO2: 96% 97%    Last Pain:  Vitals:   12/02/22 2101  TempSrc: Oral  PainSc:                  Nelle Don Daeshawn Redmann

## 2022-12-02 NOTE — H&P (Signed)
Admitting Physician: Hyman Hopes Izola Teague  Service: General Surgery  CC: Abdominal pain  Subjective   HPI: Gloria Mckinney is an 77 y.o. female who is here for abdominal pain.  The pain is all over the abdomen but seems focused in the right lower quadrant.  She has had some nausea, she hasn't eaten today.  It is quite severe.    Past Medical History:  Diagnosis Date   Cancer    GERD (gastroesophageal reflux disease)    History of radiation therapy 01/16/19- 02/13/19   left breast 15 fractions of 2.67 Gy for a total of 40.05 Gy. Left breast boost 5 fractions of 2 Gy for a total of 10 Gy   Hypertension    Mixed hyperlipidemia    OA (osteoarthritis)    knee right   Peripheral neuropathy    PVD (peripheral vascular disease)    Type 2 diabetes mellitus treated with insulin    endocrinologist--- dr Armen Pickup [place]andez    Past Surgical History:  Procedure Laterality Date   BREAST BIOPSY Bilateral 2000   benign   BREAST LUMPECTOMY WITH RADIOACTIVE SEED AND SENTINEL LYMPH NODE BIOPSY Left 08/15/2018   Procedure: LEFT BREAST LUMPECTOMY WITH RADIOACTIVE SEED AND LEFT AXILLARY DEEP SENTINEL LYMPH NODE BIOPSY INJECT BLUE DYE LEFT BREAST;  Surgeon: Claud Kelp, MD;  Location: [facility];  Service: General;  Laterality: Left;   PORT-A-CATH REMOVAL Right 08/09/2019   Procedure: REMOVAL PORT-A-CATH;  Surgeon: Claud Kelp, MD;  Location: [facility];  Service: General;  Laterality: Right;   PORTACATH PLACEMENT Right 08/15/2018   Procedure: INSERTION PORT-A-CATH WITH ULTRASOUND;  Surgeon: Claud Kelp, MD;  Location: [facility];  Service: General;  Laterality: Right;   SHOULDER ARTHROSCOPY Right 06-16-2002   dr graves   SHOULDER OPEN ROTATOR CUFF REPAIR Right 07-28-2000    dr Luiz Blare   VAGINAL HYSTERECTOMY  1992   with BSO   WRIST SURGERY  2007    Family History  Problem Relation Age of Onset   Hypertension Mother    CVA Mother     Heart disease Mother    Asthma Father    Hypertension Sister    Diabetes Sister    Heart disease Brother    Hypertension Sister    Diabetes Sister    Hypertension Sister    Diabetes Sister    Healthy Son    Healthy Daughter    Healthy Daughter    Healthy Daughter    Breast cancer Neg Hx     Social:  reports that she has never smoked. She has never used smokeless tobacco. She reports that she does not drink alcohol and does not use drugs.  Allergies:  Allergies  Allergen Reactions   Penicillins Swelling and Other (See Comments)    Has patient had a PCN reaction causing immediate rash, facial/tongue/throat swelling, SOB or lightheadedness with hypotension: Yes Has patient had a PCN reaction causing severe rash involving mucus membranes or skin necrosis: No Has patient had a PCN reaction that required hospitalization: No Has patient had a PCN reaction occurring within the last 10 years: No If all of the above answers are "NO", then may proceed with Cephalosporin use.    Cephalexin Itching   Statins Other (See Comments)    MYALGIAS    Medications: Current Outpatient Medications  Medication Instructions   anastrozole (ARIMIDEX) 1 mg, Oral, Daily   aspirin EC 81 mg, Oral, Daily   Calcium Carb-Cholecalciferol (CALCIUM + VITAMIN D3  PO) 1 tablet, Oral, Daily   Empagliflozin-metFORMIN HCl (SYNJARDY) 12.12-998 MG TABS 1 tablet, Oral, 2 times daily   EXFORGE 5-320 MG tablet 1 tablet, Oral, Daily   hydrochlorothiazide (HYDRODIURIL) 25 mg, Oral, Daily   omeprazole (PRILOSEC) 40 mg, Oral, Every morning   simvastatin (ZOCOR) 40 mg, Oral, Daily    ROS - all of the below systems have been reviewed with the patient and positives are indicated with bold text General: chills, fever or night sweats Eyes: blurry vision or double vision ENT: epistaxis or sore throat Allergy/Immunology: itchy/watery eyes or nasal congestion Hematologic/Lymphatic: bleeding problems, blood clots or swollen  lymph nodes Endocrine: temperature intolerance or unexpected weight changes Breast: new or changing breast lumps or nipple discharge Resp: cough, shortness of breath, or wheezing CV: chest pain or dyspnea on exertion GI: as per HPI GU: dysuria, trouble voiding, or hematuria MSK: joint pain or joint stiffness Neuro: TIA or stroke symptoms Derm: pruritus and skin lesion changes Psych: anxiety and depression  Objective   PE Blood pressure (!) 175/86, pulse (!) 111, temperature 98.9 F (37.2 C), temperature source Oral, resp. rate (!) 21, SpO2 99 %. Constitutional: NAD; conversant; no deformities Eyes: Moist conjunctiva; no lid lag; anicteric; PERRL Neck: Trachea midline; no thyromegaly Lungs: Normal respiratory effort; no tactile fremitus CV: RRR; no palpable thrills; no pitting edema GI: Abd Soft, tender RLQ; no palpable hepatosplenomegaly MSK: Normal range of motion of extremities; no clubbing/cyanosis Psychiatric: Appropriate affect; alert and oriented x3 Lymphatic: No palpable cervical or axillary lymphadenopathy  Results for orders placed or performed during the hospital encounter of 12/02/22 (from the past 24 hour(s))  Comprehensive metabolic panel     Status: Abnormal   Collection Time: 12/02/22 11:54 AM  Result Value Ref Range   Sodium 131 (L) 135 - 145 mmol/L   Potassium 3.7 3.5 - 5.1 mmol/L   Chloride 95 (L) 98 - 111 mmol/L   CO2 23 22 - 32 mmol/L   Glucose, Bld 208 (H) 70 - 99 mg/dL   BUN 14 8 - 23 mg/dL   Creatinine, Ser 1.61 0.44 - 1.00 mg/dL   Calcium 9.9 8.9 - 09.6 mg/dL   Total Protein 7.5 6.5 - 8.1 g/dL   Albumin 3.9 3.5 - 5.0 g/dL   AST 29 15 - 41 U/L   ALT 21 0 - 44 U/L   Alkaline Phosphatase 62 38 - 126 U/L   Total Bilirubin 1.0 0.3 - 1.2 mg/dL   GFR, Estimated >04 >54 mL/min   Anion gap 13 5 - 15  CBC with Differential     Status: Abnormal   Collection Time: 12/02/22 11:54 AM  Result Value Ref Range   WBC 19.9 (H) 4.0 - 10.5 K/uL   RBC 4.67 3.87 -  5.11 MIL/uL   Hemoglobin 13.1 12.0 - 15.0 g/dL   HCT 09.8 11.9 - 14.7 %   MCV 82.7 80.0 - 100.0 fL   MCH 28.1 26.0 - 34.0 pg   MCHC 33.9 30.0 - 36.0 g/dL   RDW 82.9 56.2 - 13.0 %   Platelets 251 150 - 400 K/uL   nRBC 0.0 0.0 - 0.2 %   Neutrophils Relative % 90 %   Neutro Abs 18.0 (H) 1.7 - 7.7 K/uL   Lymphocytes Relative 3 %   Lymphs Abs 0.6 (L) 0.7 - 4.0 K/uL   Monocytes Relative 6 %   Monocytes Absolute 1.2 (H) 0.1 - 1.0 K/uL   Eosinophils Relative 0 %   Eosinophils Absolute  0.0 0.0 - 0.5 K/uL   Basophils Relative 0 %   Basophils Absolute 0.0 0.0 - 0.1 K/uL   Immature Granulocytes 1 %   Abs Immature Granulocytes 0.10 (H) 0.00 - 0.07 K/uL  Protime-INR     Status: None   Collection Time: 12/02/22 11:54 AM  Result Value Ref Range   Prothrombin Time 13.5 11.4 - 15.2 seconds   INR 1.0 0.8 - 1.2  APTT     Status: None   Collection Time: 12/02/22 11:54 AM  Result Value Ref Range   aPTT 32 24 - 36 seconds  Lactic acid, plasma     Status: Abnormal   Collection Time: 12/02/22 12:18 PM  Result Value Ref Range   Lactic Acid, Venous 3.0 (HH) 0.5 - 1.9 mmol/L  Lactic acid, plasma     Status: Abnormal   Collection Time: 12/02/22  3:00 PM  Result Value Ref Range   Lactic Acid, Venous 3.1 (HH) 0.5 - 1.9 mmol/L  Resp panel by RT-PCR (RSV, Flu A&B, Covid) Anterior Nasal Swab     Status: None   Collection Time: 12/02/22  3:12 PM   Specimen: Anterior Nasal Swab  Result Value Ref Range   SARS Coronavirus 2 by RT PCR NEGATIVE NEGATIVE   Influenza A by PCR NEGATIVE NEGATIVE   Influenza B by PCR NEGATIVE NEGATIVE   Resp Syncytial Virus by PCR NEGATIVE NEGATIVE     Imaging Orders          DG Chest 1 View    No active disease. Previous power port has been removed.         CT ABDOMEN PELVIS W CONTRAST    1. Dilated appendix with mild mural thickening. There are no surrounding periappendiceal inflammatory changes however. Findings are consistent with, but not diagnostic of, acute  appendicitis. Please correlate with physical exam and laboratory findings. 2. Mild distension of the proximal jejunum with associated jejunal wall thickening. While this could be due to peristalsis, underlying enteritis and regional ileus cannot be excluded. 3. Small hiatal hernia. 4.  Aortic Atherosclerosis (ICD10-I70.0).         US Abdomen Limited    Possible hepatic steatosis. No other abnormality seen in the right upper quadrant of the abdomen.   Assessment and Plan   Gloria Mckinney is an 77 y.o. female with abdominal pain, found to have appendicitis.  I recommended laparoscopic appendectomy.  We discussed the procedure, its risks, benefits and alteratives. After a full discussion and all questions answered, the patient granted consent to proceed.  We will proceed to the operating room as time allows.  The patient's daughter assisted with translation, a translator was offered but the family deferred.  Quentin Ore, MD  [facility] Use [email or web address] to contact on call provider  New Patient Billing: [id number] - High MDM

## 2022-12-02 NOTE — ED Provider Triage Note (Signed)
Emergency Medicine Provider Triage Evaluation Note  Gloria Mckinney , a 77 y.o. female  was evaluated in triage.  Pt complains of abdominal pain of sudden onset which began last night, stabbing in nature to the right lower quadrant.  Did not take any medication for improvement in symptoms.Evaluated at [facility] and sent here for CT  r/o intra-abdominal pathology.  Review of Systems  Positive: Abdominal pain Negative: Nausea, vomiting  Physical Exam  There were no vitals taken for this visit. Gen:   Awake, no distress   Resp:  Normal effort  MSK:   Moves extremities without difficulty  Other:  RUQ pain focal tenderness  Medical Decision Making  Medically screening exam initiated at 11:54 AM.  Appropriate orders placed.  Gloria Mckinney was informed that the remainder of the evaluation will be completed by another provider, this initial triage assessment does not replace that evaluation, and the importance of remaining in the ED until their evaluation is complete.     Claude Manges, PA-C 12/02/22 1158

## 2022-12-02 NOTE — Transfer of Care (Signed)
Immediate Anesthesia Transfer of Care Note  Patient: Gloria Mckinney  Procedure(s) Performed: APPENDECTOMY LAPAROSCOPIC  Patient Location: PACU  Anesthesia Type:General  Level of Consciousness: awake, alert , and drowsy  Airway & Oxygen Therapy: Patient Spontanous Breathing  Post-op Assessment: Report given to RN, Post -op Vital signs reviewed and stable, and Patient moving all extremities X 4  Post vital signs: Reviewed and stable  Last Vitals:  Vitals Value Taken Time  BP 141/65 12/02/22 2000  Temp 37 C 12/02/22 2000  Pulse 101 12/02/22 2004  Resp 20 12/02/22 2004  SpO2 88 % 12/02/22 2004  Vitals shown include unvalidated device data.  Last Pain:  Vitals:   12/02/22 1803  TempSrc: Oral  PainSc: 7          Complications: No notable events documented.

## 2022-12-02 NOTE — ED Triage Notes (Addendum)
Pt. Stated, Ive had abdominal all around that started last night at 100. Denies any other symptom., symptoms radiate to the back.

## 2022-12-03 ENCOUNTER — Encounter (HOSPITAL_COMMUNITY): Payer: Self-pay | Admitting: Surgery

## 2022-12-03 DIAGNOSIS — Z79811 Long term (current) use of aromatase inhibitors: Secondary | ICD-10-CM | POA: Diagnosis not present

## 2022-12-03 DIAGNOSIS — Z90710 Acquired absence of both cervix and uterus: Secondary | ICD-10-CM | POA: Diagnosis not present

## 2022-12-03 DIAGNOSIS — K3580 Unspecified acute appendicitis: Secondary | ICD-10-CM | POA: Diagnosis present

## 2022-12-03 DIAGNOSIS — E876 Hypokalemia: Secondary | ICD-10-CM | POA: Diagnosis not present

## 2022-12-03 DIAGNOSIS — I10 Essential (primary) hypertension: Secondary | ICD-10-CM | POA: Diagnosis present

## 2022-12-03 DIAGNOSIS — Z888 Allergy status to other drugs, medicaments and biological substances status: Secondary | ICD-10-CM | POA: Diagnosis not present

## 2022-12-03 DIAGNOSIS — Z90722 Acquired absence of ovaries, bilateral: Secondary | ICD-10-CM | POA: Diagnosis not present

## 2022-12-03 DIAGNOSIS — E1142 Type 2 diabetes mellitus with diabetic polyneuropathy: Secondary | ICD-10-CM | POA: Diagnosis present

## 2022-12-03 DIAGNOSIS — Z794 Long term (current) use of insulin: Secondary | ICD-10-CM | POA: Diagnosis not present

## 2022-12-03 DIAGNOSIS — M1711 Unilateral primary osteoarthritis, right knee: Secondary | ICD-10-CM | POA: Diagnosis present

## 2022-12-03 DIAGNOSIS — Z833 Family history of diabetes mellitus: Secondary | ICD-10-CM | POA: Diagnosis not present

## 2022-12-03 DIAGNOSIS — R000 Tachycardia, unspecified: Secondary | ICD-10-CM | POA: Diagnosis present

## 2022-12-03 DIAGNOSIS — Z923 Personal history of irradiation: Secondary | ICD-10-CM | POA: Diagnosis not present

## 2022-12-03 DIAGNOSIS — Z881 Allergy status to other antibiotic agents status: Secondary | ICD-10-CM | POA: Diagnosis not present

## 2022-12-03 DIAGNOSIS — Z79899 Other long term (current) drug therapy: Secondary | ICD-10-CM | POA: Diagnosis not present

## 2022-12-03 DIAGNOSIS — E1151 Type 2 diabetes mellitus with diabetic peripheral angiopathy without gangrene: Secondary | ICD-10-CM | POA: Diagnosis present

## 2022-12-03 DIAGNOSIS — Z7982 Long term (current) use of aspirin: Secondary | ICD-10-CM | POA: Diagnosis not present

## 2022-12-03 DIAGNOSIS — R1031 Right lower quadrant pain: Secondary | ICD-10-CM | POA: Diagnosis present

## 2022-12-03 DIAGNOSIS — Z8249 Family history of ischemic heart disease and other diseases of the circulatory system: Secondary | ICD-10-CM | POA: Diagnosis not present

## 2022-12-03 DIAGNOSIS — Z7984 Long term (current) use of oral hypoglycemic drugs: Secondary | ICD-10-CM | POA: Diagnosis not present

## 2022-12-03 DIAGNOSIS — Z1152 Encounter for screening for COVID-19: Secondary | ICD-10-CM | POA: Diagnosis not present

## 2022-12-03 DIAGNOSIS — K219 Gastro-esophageal reflux disease without esophagitis: Secondary | ICD-10-CM | POA: Diagnosis present

## 2022-12-03 DIAGNOSIS — Z9079 Acquired absence of other genital organ(s): Secondary | ICD-10-CM | POA: Diagnosis not present

## 2022-12-03 DIAGNOSIS — E782 Mixed hyperlipidemia: Secondary | ICD-10-CM | POA: Diagnosis present

## 2022-12-03 DIAGNOSIS — Z880 Allergy status to penicillin: Secondary | ICD-10-CM | POA: Diagnosis not present

## 2022-12-03 LAB — GLUCOSE, CAPILLARY
Glucose-Capillary: 201 mg/dL — ABNORMAL HIGH (ref 70–99)
Glucose-Capillary: 165 mg/dL — ABNORMAL HIGH (ref 70–99)
Glucose-Capillary: 258 mg/dL — ABNORMAL HIGH (ref 70–99)
Glucose-Capillary: 245 mg/dL — ABNORMAL HIGH (ref 70–99)

## 2022-12-03 LAB — HEMOGLOBIN A1C
Hgb A1c MFr Bld: 7.2 % — ABNORMAL HIGH (ref 4.8–5.6)
Mean Plasma Glucose: 159.94 mg/dL

## 2022-12-03 LAB — CREATININE, SERUM
Creatinine, Ser: 0.79 mg/dL (ref 0.44–1.00)
GFR, Estimated: >60 mL/min (ref >60)

## 2022-12-03 LAB — CBC
HCT: 30.4 % — ABNORMAL LOW (ref 36.0–46.0)
Hemoglobin: 10.9 g/dL — ABNORMAL LOW (ref 12.0–15.0)
MCH: 28.8 pg (ref 26.0–34.0)
MCHC: 35.9 g/dL (ref 30.0–36.0)
MCV: 80.4 fL (ref 80.0–100.0)
Platelets: 188 10*3/uL (ref 150–400)
RBC: 3.78 MIL/uL — ABNORMAL LOW (ref 3.87–5.11)
RDW: 12.7 % (ref 11.5–15.5)
WBC: 18.5 10*3/uL — ABNORMAL HIGH (ref 4.0–10.5)
nRBC: 0 % (ref 0.0–0.2)

## 2022-12-03 LAB — CULTURE, BLOOD (ROUTINE X 2): Culture: NO GROWTH

## 2022-12-03 MED ORDER — MORPHINE SULFATE (PF) 2 MG/ML IV SOLN: 2.0000 mg | INTRAVENOUS | Status: DC | PRN

## 2022-12-03 MED ORDER — TRAMADOL HCL 50 MG PO TABS: 50.0000 mg | ORAL_TABLET | Freq: Four times a day (QID) | ORAL | Status: DC | PRN

## 2022-12-03 MED ORDER — ACETAMINOPHEN 325 MG PO TABS
650.0000 mg | ORAL_TABLET | Freq: Four times a day (QID) | ORAL | Status: DC | PRN
  Administered 2022-12-03 – 2022-12-06 (×4): 650 mg via ORAL
  Filled 2022-12-03 (×4): qty 2

## 2022-12-03 NOTE — Progress Notes (Signed)
   12/03/22 1947  Assess: MEWS Score  MEWS Pulse 2  MEWS RR 0  MEWS LOC 0  MEWS Score 4  MEWS Score Color Red  Assess: if the MEWS score is Yellow or Red  Were vital signs taken at a resting state? Yes  Focused Assessment Change from prior assessment (see assessment flowsheet)  Does the patient meet 2 or more of the SIRS criteria? Yes  Does the patient have a confirmed or suspected source of infection? Yes  Provider and Rapid Response Notified? Yes  MEWS guidelines implemented  No, previously red, continue vital signs every 4 hours  Assess: SIRS CRITERIA  SIRS Temperature  1  SIRS Pulse 1  SIRS Respirations  0  SIRS WBC 0  SIRS Score Sum  2   Prn tylenol given

## 2022-12-03 NOTE — TOC CM/SW Note (Signed)
  Transition of Care (TOC) Screening Note   Patient Details  Name: Gloria Mckinney Date of Birth: May 01, 1946   Fevers over night WBC 18, continue IV abx today - repeat labs in AM - possibly home tomorrow if doing well      Transition of Care Department [facility]) has reviewed patient and no TOC needs have been identified at this time. We will continue to monitor patient advancement through interdisciplinary progression rounds. If new patient transition needs arise, please place a TOC consult.

## 2022-12-03 NOTE — Discharge Instructions (Signed)

## 2022-12-03 NOTE — Progress Notes (Signed)
Progress Note  1 Day Post-Op  Subjective: Family member present and translating. Patient is sore. Just got up to bathroom to urinate. Denies nausea or vomiting. Lives at home with her husband. Febrile to 101 overnight   Objective: Vital signs in last 24 hours: Temp:  [98.6 F (37 C)-101.6 F (38.7 C)] 99.6 F (37.6 C) (04/25 0849) Pulse Rate:  [99-116] 110 (04/25 0849) Resp:  [16-24] 17 (04/25 0849) BP: (90-175)/(49-131) 130/65 (04/25 0849) SpO2:  [95 %-100 %] 100 % (04/25 0849) Weight:  [74.8 kg] 74.8 kg (04/24 1803)    Intake/Output from previous day: 04/24 0701 - 04/25 0700 In: 950 [I.V.:700; IV Piggyback:250] Out: -  Intake/Output this shift: Total I/O In: 120 [P.O.:120] Out: -   PE: General: pleasant, WD, WN female who is laying in bed in NAD Heart: regular, rate, and rhythm.   Lungs: Respiratory effort nonlabored Abd: soft, appropriately ttp, mild distention, incisions C/D/I Psych: A&Ox3 with an appropriate affect.    Lab Results:  Recent Labs    12/02/22 1154 12/03/22 0119  WBC 19.9* 18.5*  HGB 13.1 10.9*  HCT 38.6 30.4*  PLT 251 188   BMET Recent Labs    12/02/22 1154 12/02/22 2315  NA 131*  --   K 3.7  --   CL 95*  --   CO2 23  --   GLUCOSE 208*  --   BUN 14  --   CREATININE 0.88 0.79  CALCIUM 9.9  --    PT/INR Recent Labs    12/02/22 1154  LABPROT 13.5  INR 1.0   CMP     Component Value Date/Time   NA 131 (L) 12/02/2022 1154   K 3.7 12/02/2022 1154   CL 95 (L) 12/02/2022 1154   CO2 23 12/02/2022 1154   GLUCOSE 208 (H) 12/02/2022 1154   BUN 14 12/02/2022 1154   CREATININE 0.79 12/02/2022 2315   CREATININE 0.92 08/03/2019 0942   CALCIUM 9.9 12/02/2022 1154   PROT 7.5 12/02/2022 1154   ALBUMIN 3.9 12/02/2022 1154   AST 29 12/02/2022 1154   AST 16 08/03/2019 0942   ALT 21 12/02/2022 1154   ALT 22 08/03/2019 0942   ALKPHOS 62 12/02/2022 1154   BILITOT 1.0 12/02/2022 1154   BILITOT 0.3 08/03/2019 0942   GFRNONAA >60  12/02/2022 2315   GFRNONAA >60 08/03/2019 0942   GFRAA >60 08/03/2019 0942   Lipase  No results found for: "LIPASE"     Studies/Results: CT ABDOMEN PELVIS W CONTRAST  Result Date: 12/02/2022 CLINICAL DATA:  Abdominal pain, neutropenia EXAM: CT ABDOMEN AND PELVIS WITH CONTRAST TECHNIQUE: Multidetector CT imaging of the abdomen and pelvis was performed using the standard protocol following bolus administration of intravenous contrast. RADIATION DOSE REDUCTION: This exam was performed according to the departmental dose-optimization program which includes automated exposure control, adjustment of the mA and/or kV according to patient size and/or use of iterative reconstruction technique. CONTRAST:  75mL OMNIPAQUE IOHEXOL 350 MG/ML SOLN COMPARISON:  12/02/2022, 08/29/2010 FINDINGS: Lower chest: No acute pleural or parenchymal lung disease. Chronic stable benign right breast lesion previously evaluated by multiple mammograms. Hepatobiliary: Portions of the dome of the liver are excluded by slice selection. The liver is unremarkable. Gallbladder is moderately distended without evidence of cholelithiasis or cholecystitis. Pancreas: Unremarkable. No pancreatic ductal dilatation or surrounding inflammatory changes. Spleen: Normal in size without focal abnormality. Adrenals/Urinary Tract: Adrenal glands are unremarkable. Kidneys are normal, without renal calculi, focal lesion, or hydronephrosis. Bladder is unremarkable. Stomach/Bowel:  There is no evidence of high-grade small bowel obstruction. Mild distension of the proximal jejunum is noted, with proximal jejunal wall thickening. This is nonspecific, and could be related to peristalsis. Underlying enteritis and regional ileus cannot be excluded. The appendix is identified in the sub hepatic region, and is distended measuring up to 12 mm in thickness with mild mural thickening noted. No periappendiceal fat stranding. Findings are suspicious but not definitive for  acute appendicitis. There is a small hiatal hernia. Vascular/Lymphatic: Aortic atherosclerosis. No enlarged abdominal or pelvic lymph nodes. Reproductive: The uterus is surgically absent. There are no adnexal masses. Other: No free fluid or free intraperitoneal gas. No abdominal wall hernia. Musculoskeletal: No acute or destructive bony lesions. Reconstructed images demonstrate no additional findings. IMPRESSION: 1. Dilated appendix with mild mural thickening. There are no surrounding periappendiceal inflammatory changes however. Findings are consistent with, but not diagnostic of, acute appendicitis. Please correlate with physical exam and laboratory findings. 2. Mild distension of the proximal jejunum with associated jejunal wall thickening. While this could be due to peristalsis, underlying enteritis and regional ileus cannot be excluded. 3. Small hiatal hernia. 4.  Aortic Atherosclerosis (ICD10-I70.0). Electronically Signed   By: Sharlet Salina M.D.   On: 12/02/2022 17:04   US Abdomen Limited  Result Date: 12/02/2022 CLINICAL DATA:  Acute right upper quadrant abdominal pain. EXAM: ULTRASOUND ABDOMEN LIMITED RIGHT UPPER QUADRANT COMPARISON:  None Available. FINDINGS: Gallbladder: No gallstones or wall thickening visualized. No sonographic Murphy sign noted by sonographer. Common bile duct: Diameter: 5 mm which is within normal limits. Liver: No focal lesion identified. Mildly increased echogenicity of hepatic parenchyma is noted suggesting possible hepatic steatosis. Portal vein is patent on color Doppler imaging with normal direction of blood flow towards the liver. Other: None. IMPRESSION: Possible hepatic steatosis. No other abnormality seen in the right upper quadrant of the abdomen. Electronically Signed   By: Lupita Raider M.D.   On: 12/02/2022 15:28   DG Chest 1 View  Result Date: 12/02/2022 CLINICAL DATA:  Abdominal pain.  Sepsis. EXAM: CHEST  1 VIEW COMPARISON:  08/15/2018 FINDINGS: Previously  seen power port has been removed. Heart size is normal. Mediastinal shadows are normal. Patient has not taken a deep inspiration, but allowing for that, the lungs are clear. No consolidation, collapse, edema or effusion. Old surgery in the right shoulder with radiopaque anchors. Distal clavicular resections. IMPRESSION: No active disease. Previous power port has been removed. Electronically Signed   By: Paulina Fusi M.D.   On: 12/02/2022 13:05    Anti-infectives: Anti-infectives (From admission, onward)    Start     Dose/Rate Route Frequency Ordered Stop   12/02/22 1500  cefTRIAXone (ROCEPHIN) 2 g in sodium chloride 0.9 % 100 mL IVPB        2 g 200 mL/hr over 30 Minutes Intravenous Every 24 hours 12/02/22 1450     12/02/22 1500  metroNIDAZOLE (FLAGYL) IVPB 500 mg        500 mg 100 mL/hr over 60 Minutes Intravenous Every 12 hours 12/02/22 1450          Assessment/Plan  POD1 s/p laparoscopic appendectomy  - febrile overnight but fever curve improving this AM - mobilize today - ADAT, on FLD currently - WBC 18, continue IV abx today - repeat labs in AM - possibly home tomorrow if doing well    FEN: FLD, IVF VTE: LMWH ID: rocephin/flagyl  LOS: 0 days     Juliet Rude, PA-C [facility] 12/03/2022, 9:31 AM Please see Amion for pager number during day hours 7:00am-4:30pm

## 2022-12-03 NOTE — Progress Notes (Signed)
   12/03/22 1300  Mobility  Activity Ambulated with assistance in hallway  Level of Assistance Contact guard assist, steadying assist  Assistive Device Front wheel walker  Distance Ambulated (ft) 250 ft  Activity Response Tolerated well  Mobility Referral Yes  $Mobility charge 1 Mobility   Mobility Specialist Progress Note  Pt was in bed and agreeable. Had no c/o pain. Left in bed w/ all needs met and call bell in reach.   Anastasia Pall Mobility Specialist  Please contact via SecureChat or Rehab office at [phone]

## 2022-12-04 LAB — CBC
HCT: 28.7 % — ABNORMAL LOW (ref 36.0–46.0)
Hemoglobin: 9.8 g/dL — ABNORMAL LOW (ref 12.0–15.0)
MCH: 28.4 pg (ref 26.0–34.0)
MCHC: 34.1 g/dL (ref 30.0–36.0)
MCV: 83.2 fL (ref 80.0–100.0)
Platelets: 121 10*3/uL — ABNORMAL LOW (ref 150–400)
RBC: 3.45 MIL/uL — ABNORMAL LOW (ref 3.87–5.11)
RDW: 12.9 % (ref 11.5–15.5)
WBC: 16 10*3/uL — ABNORMAL HIGH (ref 4.0–10.5)
nRBC: 0 % (ref 0.0–0.2)

## 2022-12-04 LAB — CULTURE, BLOOD (ROUTINE X 2)
Culture: NO GROWTH
Special Requests: ADEQUATE

## 2022-12-04 LAB — GLUCOSE, CAPILLARY
Glucose-Capillary: 157 mg/dL — ABNORMAL HIGH (ref 70–99)
Glucose-Capillary: 226 mg/dL — ABNORMAL HIGH (ref 70–99)
Glucose-Capillary: 235 mg/dL — ABNORMAL HIGH (ref 70–99)

## 2022-12-04 LAB — SURGICAL PATHOLOGY

## 2022-12-04 NOTE — Progress Notes (Signed)
   12/04/22 1000  Mobility  Activity Ambulated with assistance in hallway  Level of Assistance Minimal assist, patient does 75% or more  Assistive Device Front wheel walker  Distance Ambulated (ft) 250 ft  Activity Response Tolerated well  Mobility Referral Yes  $Mobility charge 1 Mobility   Mobility Specialist Progress Note  Pt was in bed and agreeable. Had no c/o pain. Returned to bed w/ all needs met and call bell in reach.   Anastasia Pall Mobility Specialist  Please contact via SecureChat or Rehab office at [phone]

## 2022-12-04 NOTE — Progress Notes (Signed)
Progress Note  2 Days Post-Op  Subjective: Feels much better.  Pain well controlled.  Temp overnight of 102.4.  eating well.  4 episodes of watery diarrhea this morning.  Objective: Vital signs in last 24 hours: Temp:  [98.3 F (36.8 C)-102.4 F (39.1 C)] 98.3 F (36.8 C) (04/26 0725) Pulse Rate:  [101-115] 108 (04/26 0725) Resp:  [16-18] 16 (04/26 0725) BP: (108-155)/(57-85) 111/57 (04/26 0725) SpO2:  [96 %-98 %] 96 % (04/26 0725)    Intake/Output from previous day: 04/25 0701 - 04/26 0700 In: 360 [P.O.:360] Out: -  Intake/Output this shift: No intake/output data recorded.  PE: General: NAD Abd: soft, appropriately ttp, ND, incisions C/D/I Psych: A&Ox3 with an appropriate affect.    Lab Results:  Recent Labs    12/02/22 1154 12/03/22 0119  WBC 19.9* 18.5*  HGB 13.1 10.9*  HCT 38.6 30.4*  PLT 251 188   BMET Recent Labs    12/02/22 1154 12/02/22 2315  NA 131*  --   K 3.7  --   CL 95*  --   CO2 23  --   GLUCOSE 208*  --   BUN 14  --   CREATININE 0.88 0.79  CALCIUM 9.9  --    PT/INR Recent Labs    12/02/22 1154  LABPROT 13.5  INR 1.0   CMP     Component Value Date/Time   NA 131 (L) 12/02/2022 1154   K 3.7 12/02/2022 1154   CL 95 (L) 12/02/2022 1154   CO2 23 12/02/2022 1154   GLUCOSE 208 (H) 12/02/2022 1154   BUN 14 12/02/2022 1154   CREATININE 0.79 12/02/2022 2315   CREATININE 0.92 08/03/2019 0942   CALCIUM 9.9 12/02/2022 1154   PROT 7.5 12/02/2022 1154   ALBUMIN 3.9 12/02/2022 1154   AST 29 12/02/2022 1154   AST 16 08/03/2019 0942   ALT 21 12/02/2022 1154   ALT 22 08/03/2019 0942   ALKPHOS 62 12/02/2022 1154   BILITOT 1.0 12/02/2022 1154   BILITOT 0.3 08/03/2019 0942   GFRNONAA >60 12/02/2022 2315   GFRNONAA >60 08/03/2019 0942   GFRAA >60 08/03/2019 0942   Lipase  No results found for: "LIPASE"     Studies/Results: CT ABDOMEN PELVIS W CONTRAST  Result Date: 12/02/2022 CLINICAL DATA:  Abdominal pain, neutropenia EXAM:  CT ABDOMEN AND PELVIS WITH CONTRAST TECHNIQUE: Multidetector CT imaging of the abdomen and pelvis was performed using the standard protocol following bolus administration of intravenous contrast. RADIATION DOSE REDUCTION: This exam was performed according to the departmental dose-optimization program which includes automated exposure control, adjustment of the mA and/or kV according to patient size and/or use of iterative reconstruction technique. CONTRAST:  75mL OMNIPAQUE IOHEXOL 350 MG/ML SOLN COMPARISON:  12/02/2022, 08/29/2010 FINDINGS: Lower chest: No acute pleural or parenchymal lung disease. Chronic stable benign right breast lesion previously evaluated by multiple mammograms. Hepatobiliary: Portions of the dome of the liver are excluded by slice selection. The liver is unremarkable. Gallbladder is moderately distended without evidence of cholelithiasis or cholecystitis. Pancreas: Unremarkable. No pancreatic ductal dilatation or surrounding inflammatory changes. Spleen: Normal in size without focal abnormality. Adrenals/Urinary Tract: Adrenal glands are unremarkable. Kidneys are normal, without renal calculi, focal lesion, or hydronephrosis. Bladder is unremarkable. Stomach/Bowel: There is no evidence of high-grade small bowel obstruction. Mild distension of the proximal jejunum is noted, with proximal jejunal wall thickening. This is nonspecific, and could be related to peristalsis. Underlying enteritis and regional ileus cannot be excluded. The appendix is identified  in the sub hepatic region, and is distended measuring up to 12 mm in thickness with mild mural thickening noted. No periappendiceal fat stranding. Findings are suspicious but not definitive for acute appendicitis. There is a small hiatal hernia. Vascular/Lymphatic: Aortic atherosclerosis. No enlarged abdominal or pelvic lymph nodes. Reproductive: The uterus is surgically absent. There are no adnexal masses. Other: No free fluid or free  intraperitoneal gas. No abdominal wall hernia. Musculoskeletal: No acute or destructive bony lesions. Reconstructed images demonstrate no additional findings. IMPRESSION: 1. Dilated appendix with mild mural thickening. There are no surrounding periappendiceal inflammatory changes however. Findings are consistent with, but not diagnostic of, acute appendicitis. Please correlate with physical exam and laboratory findings. 2. Mild distension of the proximal jejunum with associated jejunal wall thickening. While this could be due to peristalsis, underlying enteritis and regional ileus cannot be excluded. 3. Small hiatal hernia. 4.  Aortic Atherosclerosis (ICD10-I70.0). Electronically Signed   By: Sharlet Salina M.D.   On: 12/02/2022 17:04   US Abdomen Limited  Result Date: 12/02/2022 CLINICAL DATA:  Acute right upper quadrant abdominal pain. EXAM: ULTRASOUND ABDOMEN LIMITED RIGHT UPPER QUADRANT COMPARISON:  None Available. FINDINGS: Gallbladder: No gallstones or wall thickening visualized. No sonographic Murphy sign noted by sonographer. Common bile duct: Diameter: 5 mm which is within normal limits. Liver: No focal lesion identified. Mildly increased echogenicity of hepatic parenchyma is noted suggesting possible hepatic steatosis. Portal vein is patent on color Doppler imaging with normal direction of blood flow towards the liver. Other: None. IMPRESSION: Possible hepatic steatosis. No other abnormality seen in the right upper quadrant of the abdomen. Electronically Signed   By: Lupita Raider M.D.   On: 12/02/2022 15:28   DG Chest 1 View  Result Date: 12/02/2022 CLINICAL DATA:  Abdominal pain.  Sepsis. EXAM: CHEST  1 VIEW COMPARISON:  08/15/2018 FINDINGS: Previously seen power port has been removed. Heart size is normal. Mediastinal shadows are normal. Patient has not taken a deep inspiration, but allowing for that, the lungs are clear. No consolidation, collapse, edema or effusion. Old surgery in the right  shoulder with radiopaque anchors. Distal clavicular resections. IMPRESSION: No active disease. Previous power port has been removed. Electronically Signed   By: Paulina Fusi M.D.   On: 12/02/2022 13:05    Anti-infectives: Anti-infectives (From admission, onward)    Start     Dose/Rate Route Frequency Ordered Stop   12/02/22 1500  cefTRIAXone (ROCEPHIN) 2 g in sodium chloride 0.9 % 100 mL IVPB        2 g 200 mL/hr over 30 Minutes Intravenous Every 24 hours 12/02/22 1450     12/02/22 1500  metroNIDAZOLE (FLAGYL) IVPB 500 mg        500 mg 100 mL/hr over 60 Minutes Intravenous Every 12 hours 12/02/22 1450          Assessment/Plan  POD 2, s/p laparoscopic appendectomy for phlegmonous appendicitis, Dr. Dossie Der 2/24 - febrile overnight up to 102.4. - mobilizing well - tolerating regular diet - WBC 18 yesterday, pending today, continue IV abx today - repeat labs in AM - possibly home tomorrow if doing well with decreasing WBC and fever curve. -monitor diarrhea  FEN: regular, IVF @ 50cc VTE: LMWH ID: rocephin/flagyl  LOS: 1 day    Letha Cape, [facility] 12/04/2022, 11:06 AM Please see Amion for pager number during day hours 7:00am-4:30pm

## 2022-12-04 NOTE — Inpatient Diabetes Management (Signed)
Inpatient Diabetes Program Recommendations  AACE/ADA: New Consensus Statement on Inpatient Glycemic Control   Target Ranges:  Prepandial:   less than 140 mg/dL      Peak postprandial:   less than 180 mg/dL (1-2 hours)      Critically ill patients:  140 - 180 mg/dL    Latest Reference Range & Units 12/03/22 10:28 12/03/22 12:55 12/03/22 16:33 12/03/22 21:09  Glucose-Capillary 70 - 99 mg/dL 161 (H) 096 (H) 045 (H) 245 (H)   Review of Glycemic Control  Diabetes history: DM2 Outpatient Diabetes medications: Amaryl 4 mg BID, Metformin 1000 mg BID, Ozempic 0.5 mg Qweek Current orders for Inpatient glycemic control: Novolog 0-15 units TID with meals, Novolog 0-5 units QHS  Inpatient Diabetes Program Recommendations:    Insulin: If remains inpatient, please consider ordering Semglee 5 units Q24H.  Thanks, Orlando Penner, RN, MSN, CDCES Diabetes Coordinator Inpatient Diabetes Program [phone] (Team Pager from 8am to 5pm)

## 2022-12-05 LAB — BASIC METABOLIC PANEL
Anion gap: 7 (ref 5–15)
Anion gap: 11 (ref 5–15)
BUN: 11 mg/dL (ref 8–23)
BUN: 7 mg/dL — ABNORMAL LOW (ref 8–23)
CO2: 23 mmol/L (ref 22–32)
CO2: 22 mmol/L (ref 22–32)
Calcium: 8.1 mg/dL — ABNORMAL LOW (ref 8.9–10.3)
Calcium: 8.6 mg/dL — ABNORMAL LOW (ref 8.9–10.3)
Chloride: 101 mmol/L (ref 98–111)
Chloride: 100 mmol/L (ref 98–111)
Creatinine, Ser: 1 mg/dL (ref 0.44–1.00)
Creatinine, Ser: 0.84 mg/dL (ref 0.44–1.00)
GFR, Estimated: 59 mL/min — ABNORMAL LOW (ref >60)
GFR, Estimated: >60 mL/min (ref >60)
Glucose, Bld: 236 mg/dL — ABNORMAL HIGH (ref 70–99)
Glucose, Bld: 233 mg/dL — ABNORMAL HIGH (ref 70–99)
Potassium: 2.4 mmol/L — CL (ref 3.5–5.1)
Potassium: 3.1 mmol/L — ABNORMAL LOW (ref 3.5–5.1)
Sodium: 131 mmol/L — ABNORMAL LOW (ref 135–145)
Sodium: 133 mmol/L — ABNORMAL LOW (ref 135–145)

## 2022-12-05 LAB — CBC
HCT: 28.3 % — ABNORMAL LOW (ref 36.0–46.0)
Hemoglobin: 9.7 g/dL — ABNORMAL LOW (ref 12.0–15.0)
MCH: 28.2 pg (ref 26.0–34.0)
MCHC: 34.3 g/dL (ref 30.0–36.0)
MCV: 82.3 fL (ref 80.0–100.0)
Platelets: 136 10*3/uL — ABNORMAL LOW (ref 150–400)
RBC: 3.44 MIL/uL — ABNORMAL LOW (ref 3.87–5.11)
RDW: 12.7 % (ref 11.5–15.5)
WBC: 14.5 10*3/uL — ABNORMAL HIGH (ref 4.0–10.5)
nRBC: 0 % (ref 0.0–0.2)

## 2022-12-05 LAB — GLUCOSE, CAPILLARY
Glucose-Capillary: 157 mg/dL — ABNORMAL HIGH (ref 70–99)
Glucose-Capillary: 228 mg/dL — ABNORMAL HIGH (ref 70–99)
Glucose-Capillary: 249 mg/dL — ABNORMAL HIGH (ref 70–99)
Glucose-Capillary: 362 mg/dL — ABNORMAL HIGH (ref 70–99)

## 2022-12-05 LAB — CULTURE, BLOOD (ROUTINE X 2)

## 2022-12-05 MED ORDER — POTASSIUM CHLORIDE CRYS ER 20 MEQ PO TBCR
40.0000 meq | EXTENDED_RELEASE_TABLET | Freq: Two times a day (BID) | ORAL | Status: DC
  Administered 2022-12-05: 40 meq via ORAL
  Filled 2022-12-05: qty 2

## 2022-12-05 MED ORDER — CIPROFLOXACIN HCL 500 MG PO TABS
500.0000 mg | ORAL_TABLET | Freq: Two times a day (BID) | ORAL | Status: DC
  Administered 2022-12-05 – 2022-12-06 (×3): 500 mg via ORAL
  Filled 2022-12-05 (×3): qty 1

## 2022-12-05 MED ORDER — POTASSIUM CHLORIDE CRYS ER 20 MEQ PO TBCR
40.0000 meq | EXTENDED_RELEASE_TABLET | Freq: Once | ORAL | Status: AC
  Administered 2022-12-05: 40 meq via ORAL
  Filled 2022-12-05: qty 2

## 2022-12-05 MED ORDER — METRONIDAZOLE 500 MG PO TABS
500.0000 mg | ORAL_TABLET | Freq: Two times a day (BID) | ORAL | Status: DC
  Administered 2022-12-05 – 2022-12-06 (×3): 500 mg via ORAL
  Filled 2022-12-05 (×3): qty 1

## 2022-12-05 MED ORDER — POTASSIUM CHLORIDE 10 MEQ/100ML IV SOLN
10.0000 meq | INTRAVENOUS | Status: AC
  Administered 2022-12-05 (×4): 10 meq via INTRAVENOUS
  Filled 2022-12-05 (×3): qty 100

## 2022-12-05 NOTE — Progress Notes (Signed)
   12/05/22 0256  Provider Notification  Provider Name/Title Dr. Eliot Ford  Date Provider Notified 12/05/22  Time Provider Notified [phone]  Method of Notification Page  Notification Reason Critical Result  Test performed and critical result K+  Date Critical Result Received 12/05/22  Time Critical Result Received 0245  Provider response See new orders  Date of Provider Response 12/05/22  Time of Provider Response 0258   K+ 2.4

## 2022-12-05 NOTE — Progress Notes (Signed)
Progress Note  3 Days Post-Op  Subjective: Looks great, sitting on edge of bed.  Tolerating her diet and drinking very well.  Stools are starting to thicken up and she has only  had 2 BMs this morning.  Eager to go home.  No further fevers in over 24 hrs.  Objective: Vital signs in last 24 hours: Temp:  [98.3 F (36.8 C)-99.7 F (37.6 C)] 98.6 F (37 C) (04/27 0838) Pulse Rate:  [93-109] 93 (04/27 0838) Resp:  [16-17] 16 (04/27 0838) BP: (124-159)/(44-81) 124/44 (04/27 0838) SpO2:  [93 %-99 %] 99 % (04/27 0838) Last BM Date : 12/04/22  Intake/Output from previous day: No intake/output data recorded. Intake/Output this shift: No intake/output data recorded.  PE: Heart: regular Abd: soft, appropriately ttp, ND, incisions C/D/I  Lab Results:  Recent Labs    12/04/22 1103 12/05/22 0128  WBC 16.0* 14.5*  HGB 9.8* 9.7*  HCT 28.7* 28.3*  PLT 121* 136*   BMET Recent Labs    12/02/22 1154 12/02/22 2315 12/05/22 0128  NA 131*  --  131*  K 3.7  --  2.4*  CL 95*  --  101  CO2 23  --  23  GLUCOSE 208*  --  236*  BUN 14  --  11  CREATININE 0.88 0.79 1.00  CALCIUM 9.9  --  8.1*   PT/INR Recent Labs    12/02/22 1154  LABPROT 13.5  INR 1.0   CMP     Component Value Date/Time   NA 131 (L) 12/05/2022 0128   K 2.4 (LL) 12/05/2022 0128   CL 101 12/05/2022 0128   CO2 23 12/05/2022 0128   GLUCOSE 236 (H) 12/05/2022 0128   BUN 11 12/05/2022 0128   CREATININE 1.00 12/05/2022 0128   CREATININE 0.92 08/03/2019 0942   CALCIUM 8.1 (L) 12/05/2022 0128   PROT 7.5 12/02/2022 1154   ALBUMIN 3.9 12/02/2022 1154   AST 29 12/02/2022 1154   AST 16 08/03/2019 0942   ALT 21 12/02/2022 1154   ALT 22 08/03/2019 0942   ALKPHOS 62 12/02/2022 1154   BILITOT 1.0 12/02/2022 1154   BILITOT 0.3 08/03/2019 0942   GFRNONAA 59 (L) 12/05/2022 0128   GFRNONAA >60 08/03/2019 0942   GFRAA >60 08/03/2019 0942   Lipase  No results found for: "LIPASE"     Studies/Results: No  results found.  Anti-infectives: Anti-infectives (From admission, onward)    Start     Dose/Rate Route Frequency Ordered Stop   12/05/22 1045  ciprofloxacin (CIPRO) tablet 500 mg        500 mg Oral 2 times daily 12/05/22 0951     12/05/22 1045  metroNIDAZOLE (FLAGYL) tablet 500 mg        500 mg Oral Every 12 hours 12/05/22 0951     12/02/22 1500  cefTRIAXone (ROCEPHIN) 2 g in sodium chloride 0.9 % 100 mL IVPB  Status:  Discontinued        2 g 200 mL/hr over 30 Minutes Intravenous Every 24 hours 12/02/22 1450 12/05/22 0951   12/02/22 1500  metroNIDAZOLE (FLAGYL) IVPB 500 mg  Status:  Discontinued        500 mg 100 mL/hr over 60 Minutes Intravenous Every 12 hours 12/02/22 1450 12/05/22 0951        Assessment/Plan  POD 3, s/p laparoscopic appendectomy for phlegmonous appendicitis, Dr. Dossie Der 2/24 - fevers resolved -K critically low this morning.  No symptoms.  Repeat bmet at 1500 to assure this is  trending up.  Repeat in am.  Diarrhea improving - mobilizing well - tolerating regular diet, SLIV - WBC down to 14K today - stop Rocephin/flagyl today and start oral cipro/flagyl due to PCN allergy.  Will tx total of 10 days. - if labs continue to improve, then likely home tomorrow  FEN: regular, SLIV VTE: LMWH ID: rocephin/flagyl stop, transition to oral cipro/flagyl  LOS: 2 days    Letha Cape, [facility] 12/05/2022, 9:51 AM Please see Amion for pager number during day hours 7:00am-4:30pm

## 2022-12-06 LAB — BASIC METABOLIC PANEL
Anion gap: 6 (ref 5–15)
BUN: 9 mg/dL (ref 8–23)
CO2: 23 mmol/L (ref 22–32)
Calcium: 8.2 mg/dL — ABNORMAL LOW (ref 8.9–10.3)
Chloride: 101 mmol/L (ref 98–111)
Creatinine, Ser: 0.79 mg/dL (ref 0.44–1.00)
GFR, Estimated: >60 mL/min (ref >60)
Glucose, Bld: 213 mg/dL — ABNORMAL HIGH (ref 70–99)
Potassium: 3.4 mmol/L — ABNORMAL LOW (ref 3.5–5.1)
Sodium: 130 mmol/L — ABNORMAL LOW (ref 135–145)

## 2022-12-06 LAB — CBC
HCT: 28 % — ABNORMAL LOW (ref 36.0–46.0)
Hemoglobin: 9.7 g/dL — ABNORMAL LOW (ref 12.0–15.0)
MCH: 28.2 pg (ref 26.0–34.0)
MCHC: 34.6 g/dL (ref 30.0–36.0)
MCV: 81.4 fL (ref 80.0–100.0)
Platelets: 148 10*3/uL — ABNORMAL LOW (ref 150–400)
RBC: 3.44 MIL/uL — ABNORMAL LOW (ref 3.87–5.11)
RDW: 12.9 % (ref 11.5–15.5)
WBC: 13.1 10*3/uL — ABNORMAL HIGH (ref 4.0–10.5)
nRBC: 0 % (ref 0.0–0.2)

## 2022-12-06 LAB — CULTURE, BLOOD (ROUTINE X 2)

## 2022-12-06 LAB — GLUCOSE, CAPILLARY: Glucose-Capillary: 179 mg/dL — ABNORMAL HIGH (ref 70–99)

## 2022-12-06 MED ORDER — CIPROFLOXACIN HCL 500 MG PO TABS: 500.0000 mg | ORAL_TABLET | Freq: Two times a day (BID) | ORAL | 0 refills | Status: AC

## 2022-12-06 MED ORDER — ACETAMINOPHEN 500 MG PO TABS: 1000.0000 mg | ORAL_TABLET | Freq: Four times a day (QID) | ORAL | Status: DC | PRN

## 2022-12-06 MED ORDER — METRONIDAZOLE 500 MG PO TABS: 500.0000 mg | ORAL_TABLET | Freq: Two times a day (BID) | ORAL | 0 refills | Status: AC

## 2022-12-06 MED ORDER — POTASSIUM CHLORIDE CRYS ER 20 MEQ PO TBCR
40.0000 meq | EXTENDED_RELEASE_TABLET | Freq: Once | ORAL | Status: AC
  Administered 2022-12-06: 40 meq via ORAL
  Filled 2022-12-06: qty 2

## 2022-12-06 MED ORDER — TRAMADOL HCL 50 MG PO TABS: 50.0000 mg | ORAL_TABLET | Freq: Four times a day (QID) | ORAL | 0 refills | Status: DC | PRN

## 2022-12-06 NOTE — Discharge Summary (Signed)
Patient ID: Gloria Mckinney [id number] April 24, 1946 77 y.o.  Admit date: 12/02/2022 Discharge date: 12/06/2022  Admitting Diagnosis: Acute appendicitis  Discharge Diagnosis Patient Active Problem List   Diagnosis Date Noted   Acute appendicitis 12/02/2022   Port-A-Cath in place 09/19/2018   Malignant neoplasm of upper-inner quadrant of left breast in female, estrogen receptor positive (HCC) 07/27/2018   Dyspnea on exertion 10/21/2016   Type 2 diabetes mellitus without complication, without long-term current use of insulin (HCC) 10/21/2016   Dyslipidemia 10/21/2016  Acute suppurative appendicitis, s/p lap appy  Consultants none  Reason for Admission: Gloria Mckinney is an 77 y.o. female who is here for abdominal pain.  The pain is all over the abdomen but seems focused in the right lower quadrant.  She has had some nausea, she hasn't eaten today.  It is quite severe.   Procedures Lap appy, Dr. Dossie Der 12/02/22  Hospital Course:  The patient was admitted and underwent the above procedure.  She tolerated this well.  Her diet was able to be advanced as tolerated, and she did well with this.  She did have fevers ranging from 101-102 on POD 1 and POD 2.  Her abx therapy was continued due to the suppurative nature of her appendix.  She also had some diarrhea.  She was monitored on abx therapy and her fevers resolved by POD 3 and her diarrhea began to dramatically improve.  She did have a critical hypokalemia that required an extra day stay in order to replace it appropriately.  This was likely secondary to her diarrhea.  This improved and she was discharged home on POD 4 is stable/good condition.  She will get 5 more days of abx therapy at home with Cipro/flagyl due to PCN allergy of swelling.  Physical Exam: Abd: soft, appropriately tender, +BS, ND, incisions c/d/i  Allergies as of 12/06/2022       Reactions   Penicillins Swelling, Other (See Comments)    Has patient had a PCN reaction causing immediate rash, facial/tongue/throat swelling, SOB or lightheadedness with hypotension: Yes Has patient had a PCN reaction causing severe rash involving mucus membranes or skin necrosis: No Has patient had a PCN reaction that required hospitalization: No Has patient had a PCN reaction occurring within the last 10 years: No If all of the above answers are "NO", then may proceed with Cephalosporin use.   Cephalexin Itching   Statins Other (See Comments)   MYALGIAS        Medication List     TAKE these medications    acetaminophen 500 MG tablet Commonly known as: TYLENOL Take 2 tablets (1,000 mg total) by mouth every 6 (six) hours as needed for mild pain.   anastrozole 1 MG tablet Commonly known as: ARIMIDEX Take 1 tablet by mouth once daily   aspirin EC 81 MG tablet Take 81 mg by mouth daily.   CALCIUM + VITAMIN D3 PO Take 1 tablet by mouth daily.   ciprofloxacin 500 MG tablet Commonly known as: CIPRO Take 1 tablet (500 mg total) by mouth 2 (two) times daily for 5 days.   Exforge 5-320 MG tablet Generic drug: amLODipine-valsartan Take 1 tablet by mouth daily.   glimepiride 4 MG tablet Commonly known as: AMARYL Take 4 mg by mouth 2 (two) times daily.   hydrochlorothiazide 25 MG tablet Commonly known as: HYDRODIURIL Take 25 mg by mouth daily.   insulin degludec 100 UNIT/ML FlexTouch Pen Commonly known as: TRESIBA Inject 45 Units into  the skin daily.   metFORMIN 1000 MG tablet Commonly known as: GLUCOPHAGE Take 1,000 mg by mouth 2 (two) times daily with a meal.   metroNIDAZOLE 500 MG tablet Commonly known as: FLAGYL Take 1 tablet (500 mg total) by mouth every 12 (twelve) hours for 5 days.   omeprazole 40 MG capsule Commonly known as: PRILOSEC Take 40 mg by mouth every morning.   Ozempic (0.25 or 0.5 MG/DOSE) 2 MG/3ML Sopn Generic drug: Semaglutide(0.25 or 0.5MG /DOS) Inject 0.5 mg into the skin every 7 (seven) days.    simvastatin 40 MG tablet Commonly known as: ZOCOR Take 40 mg by mouth daily.   traMADol 50 MG tablet Commonly known as: ULTRAM Take 1 tablet (50 mg total) by mouth every 6 (six) hours as needed for moderate pain.          Follow-up Information     Maczis, Hedda Slade, [place]. Go on 12/29/2022.   Specialty: General Surgery Why: 10 AM. Please arrive 30 min prior to appointment time. Contact information: [address] [place] [address] [facility] [place] [facility] [place] [address] [phone]                 Signed: Barnetta Chapel, [facility] 12/06/2022, 9:40 AM Please see Amion for pager number during day hours 7:00am-4:30pm, 7-11:30am on Weekends

## 2022-12-07 LAB — GLUCOSE, CAPILLARY: Glucose-Capillary: 209 mg/dL — ABNORMAL HIGH (ref 70–99)

## 2022-12-07 LAB — CULTURE, BLOOD (ROUTINE X 2): Special Requests: ADEQUATE

## 2022-12-18 ENCOUNTER — Encounter: Admit: 2022-12-18 | Payer: PRIVATE HEALTH INSURANCE | Attending: Internal Medicine | Primary: Internal Medicine

## 2022-12-19 ENCOUNTER — Encounter: Admit: 2022-12-19 | Payer: PRIVATE HEALTH INSURANCE | Attending: Internal Medicine | Primary: Internal Medicine

## 2022-12-21 MED ORDER — FREESTYLE LITE STRIPS
4 refills | Status: AC
Start: 2022-12-21 — End: 2022-12-23

## 2022-12-21 MED ORDER — LISINOPRIL 20 MG-HYDROCHLOROTHIAZIDE 12.5 MG TABLET
20-12.5 | ORAL_TABLET | ORAL | 4 refills | 90.00000 days | Status: AC
Start: 2022-12-21 — End: 2023-12-07

## 2022-12-21 MED ORDER — SIMVASTATIN 20 MG TABLET
20 | ORAL_TABLET | ORAL | 4 refills | 90.00000 days | Status: AC
Start: 2022-12-21 — End: 2023-12-07

## 2022-12-23 ENCOUNTER — Other Ambulatory Visit: Payer: Self-pay | Admitting: Hematology and Oncology

## 2022-12-23 ENCOUNTER — Encounter: Admit: 2022-12-23 | Payer: MEDICARE | Attending: Internal Medicine | Primary: Internal Medicine

## 2022-12-23 ENCOUNTER — Encounter: Admit: 2022-12-23 | Payer: PRIVATE HEALTH INSURANCE | Attending: Internal Medicine | Primary: Internal Medicine

## 2022-12-23 MED ORDER — FREESTYLE LITE STRIPS
4 refills | Status: AC
Start: 2022-12-23 — End: ?

## 2022-12-25 MED ORDER — METFORMIN IMMEDIATE RELEASE 850 MG TABLET
850 | ORAL_TABLET | ORAL | 4 refills | 90.00000 days | Status: AC
Start: 2022-12-25 — End: 2023-12-07

## 2022-12-26 ENCOUNTER — Other Ambulatory Visit: Payer: Self-pay | Admitting: Orthopedic Surgery

## 2022-12-26 DIAGNOSIS — M5416 Radiculopathy, lumbar region: Secondary | ICD-10-CM

## 2022-12-29 ENCOUNTER — Inpatient Hospital Stay: Admit: 2022-12-29 | Discharge: 2022-12-29 | Payer: MEDICARE | Primary: Internal Medicine

## 2022-12-29 DIAGNOSIS — E1169 Type 2 diabetes mellitus with other specified complication: Secondary | ICD-10-CM

## 2022-12-29 DIAGNOSIS — E785 Hyperlipidemia, unspecified: Secondary | ICD-10-CM

## 2022-12-30 ENCOUNTER — Encounter: Admit: 2022-12-30 | Payer: PRIVATE HEALTH INSURANCE | Primary: Internal Medicine

## 2022-12-30 LAB — ALBUMIN/CREATININE PANEL, URINE, RANDOM
BKR ALBUMIN, URINE, RANDOM: 36 mg/L (ref 0.0–<=1.2)
BKR ALBUMIN/CREATININE RATIO, URINE, RANDOM: 32.4 mg/g Cr — ABNORMAL HIGH (ref <30.0)
BKR CREATININE, URINE, RANDOM: 111 mg/dL (ref 9–122)

## 2022-12-30 NOTE — Other
Please notify patient labs are stable.

## 2022-12-30 NOTE — Other
My Chart message sent

## 2023-01-06 ENCOUNTER — Ambulatory Visit
Admission: RE | Admit: 2023-01-06 | Discharge: 2023-01-06 | Disposition: A | Discharge status: Home / Self Care | Payer: Medicare Other | Source: Ambulatory Visit | Attending: Orthopedic Surgery | Admitting: Orthopedic Surgery

## 2023-01-06 DIAGNOSIS — M5416 Radiculopathy, lumbar region: Secondary | ICD-10-CM

## 2023-01-13 ENCOUNTER — Other Ambulatory Visit: Payer: Self-pay | Admitting: Adult Health

## 2023-02-15 NOTE — Progress Notes (Signed)
Patient Care Team: Mila Palmer, MD as PCP - General (Family Medicine) Serena Croissant, MD as Consulting Physician (Hematology and Oncology) Axel Filler Larna Daughters, NP as Nurse Practitioner (Hematology and Oncology) Claud Kelp, MD as Consulting Physician (General Surgery) Lonie Peak, MD as Attending Physician (Radiation Oncology)  DIAGNOSIS: No diagnosis found.  SUMMARY OF ONCOLOGIC HISTORY: Oncology History  Malignant neoplasm of upper-inner quadrant of left breast in female, estrogen receptor positive (HCC)  07/18/2018 Initial Diagnosis   Screening detected left breast mass at 10 o'clock position 1.8 cm axilla negative, biopsy revealed grade 3 IDC ER 80%, PR 70%, Ki-67 40%, HER-2 +3+ by IHC with heterogeneity, T1c N0 stage Ia clinical stage   08/15/2018 Surgery   Left Lumpectomy Derrell Lolling) [phone]): IDC grade 3, 1.9 cm, Posterior margin positive (Muscle) due to a transected satellite nodule 0.5 cm, 3 lymph nodes negative for carcinoma, ER 80%, PR 70%, Ki-67 40%, HER-2 +3+ by IHC with heterogeneity T1cN0 Stage 1A.    08/24/2018 Cancer Staging   Staging form: Breast, AJCC 8th Edition - Pathologic: Stage IA (pT1b, pN0, cM0, G3, ER+, PR+, HER2+)     09/09/2018 - 08/03/2019 Chemotherapy   trastuzumab (HERCEPTIN) 300 mg in sodium chloride 0.9 % 250 mL chemo infusion, 294 mg, Intravenous,  Once, 8 of 8 cycles. Administration: 300 mg (09/09/2018), 150 mg (09/19/2018), 150 mg (10/10/2018), 450 mg (12/19/2018), 450 mg (01/09/2019), 450 mg (03/13/2019), 150 mg (09/26/2018), 150 mg (10/03/2018), 150 mg (10/17/2018), 150 mg (10/24/2018), 150 mg (10/31/2018), 150 mg (11/07/2018), 150 mg (11/14/2018), 150 mg (11/21/2018), 450 mg (11/28/2018), 450 mg (01/30/2019), 450 mg (02/20/2019)  PACLitaxel (TAXOL) 150 mg in sodium chloride 0.9 % 250 mL chemo infusion (</= 80mg /m2), 80 mg/m2 = 150 mg, Intravenous,  Once, 3 of 3 cycles. Dose modification: 60 mg/m2 (original dose 80 mg/m2, Cycle 2, Reason: Dose not tolerated).  Administration: 150 mg (09/09/2018), 150 mg (09/19/2018), 150 mg (10/10/2018), 150 mg (09/26/2018), 150 mg (10/03/2018), 114 mg (10/17/2018), 114 mg (10/24/2018), 114 mg (10/31/2018), 114 mg (11/07/2018), 114 mg (11/14/2018), 114 mg (11/21/2018), 114 mg (11/28/2018)  trastuzumab-dkst (OGIVRI) 450 mg in sodium chloride 0.9 % 250 mL chemo infusion, 450 mg (100 % of original dose 450 mg), Intravenous,  Once, 7 of 7 cycles. Dose modification: 450 mg (original dose 450 mg, Cycle 9, Reason: Other (see comments), Comment: Biosimilar Conversion). Administration: 450 mg (04/03/2019), 450 mg (04/24/2019), 450 mg (05/15/2019), 450 mg (06/05/2019), 450 mg (06/26/2019), 450 mg (07/17/2019), 450 mg (08/03/2019)    01/16/2019 - 02/13/2019 Radiation Therapy   The patient initially received a dose of 40.05 Gy in 15 fractions to the breast using whole-breast tangent fields. This was delivered using a 3-D conformal technique. The pt received a boost delivering an additional 10 Gy in 5 fractions using a electron boost with electrons. The total dose was 50.05 Gy.    08/2019 - 08/2024 Anti-estrogen oral therapy   Anastrozole     CHIEF COMPLIANT:   INTERVAL HISTORY: Gloria Mckinney is a   ALLERGIES:  is allergic to penicillins, cephalexin, and statins.  MEDICATIONS:  Current Outpatient Medications  Medication Sig Dispense Refill   acetaminophen (TYLENOL) 500 MG tablet Take 2 tablets (1,000 mg total) by mouth every 6 (six) hours as needed for mild pain.     anastrozole (ARIMIDEX) 1 MG tablet Take 1 tablet by mouth once daily 90 tablet 3   aspirin EC 81 MG tablet Take 81 mg by mouth daily.     Calcium Carb-Cholecalciferol (CALCIUM + VITAMIN  D3 PO) Take 1 tablet by mouth daily.     EXFORGE 5-320 MG tablet Take 1 tablet by mouth daily.     glimepiride (AMARYL) 4 MG tablet Take 4 mg by mouth 2 (two) times daily.     hydrochlorothiazide (HYDRODIURIL) 25 MG tablet Take 25 mg by mouth daily.     insulin degludec (TRESIBA)  100 UNIT/ML FlexTouch Pen Inject 45 Units into the skin daily.     metFORMIN (GLUCOPHAGE) 1000 MG tablet Take 1,000 mg by mouth 2 (two) times daily with a meal.     omeprazole (PRILOSEC) 40 MG capsule Take 40 mg by mouth every morning.      OZEMPIC, 0.25 OR 0.5 MG/DOSE, 2 MG/3ML SOPN Inject 0.5 mg into the skin every 7 (seven) days.     simvastatin (ZOCOR) 40 MG tablet Take 40 mg by mouth daily.     traMADol (ULTRAM) 50 MG tablet Take 1 tablet (50 mg total) by mouth every 6 (six) hours as needed for moderate pain. 10 tablet 0   No current facility-administered medications for this visit.   Facility-Administered Medications Ordered in Other Visits  Medication Dose Route Frequency Provider Last Rate Last Admin   sodium chloride flush (NS) 0.9 % injection 10 mL  10 mL Intravenous PRN Serena Croissant, MD   10 mL at 09/09/18 1114    PHYSICAL EXAMINATION: ECOG PERFORMANCE STATUS: {CHL ONC ECOG PS:[id number]}  There were no vitals filed for this visit. There were no vitals filed for this visit.  BREAST:*** No palpable masses or nodules in either right or left breasts. No palpable axillary supraclavicular or infraclavicular adenopathy no breast tenderness or nipple discharge. (exam performed in the presence of a chaperone)  LABORATORY DATA:  I have reviewed the data as listed    Latest Ref Rng & Units 12/06/2022    2:41 AM 12/05/2022    3:44 PM 12/05/2022    1:28 AM  CMP  Glucose 70 - 99 mg/dL 914  782  956   BUN 8 - 23 mg/dL 9  7  11    Creatinine 0.44 - 1.00 mg/dL 2.13  0.86  5.78   Sodium 135 - 145 mmol/L 130  133  131   Potassium 3.5 - 5.1 mmol/L 3.4  3.1  2.4   Chloride 98 - 111 mmol/L 101  100  101   CO2 22 - 32 mmol/L 23  22  23    Calcium 8.9 - 10.3 mg/dL 8.2  8.6  8.1     Lab Results  Component Value Date   WBC 13.1 (H) 12/06/2022   HGB 9.7 (L) 12/06/2022   HCT 28.0 (L) 12/06/2022   MCV 81.4 12/06/2022   PLT 148 (L) 12/06/2022   NEUTROABS 18.0 (H) 12/02/2022    ASSESSMENT  & PLAN:  No problem-specific Assessment & Plan notes found for this encounter.    No orders of the defined types were placed in this encounter.  The patient has a good understanding of the overall plan. she agrees with it. she will call with any problems that may develop before the next visit here. Total time spent: 30 mins including face to face time and time spent for planning, charting and co-ordination of care   Sherlyn Lick, CMA 02/15/23    I Janan Ridge am acting as a Neurosurgeon for [employer]  ***

## 2023-02-16 ENCOUNTER — Telehealth: Payer: Self-pay

## 2023-02-16 NOTE — Telephone Encounter (Signed)
S/w pt's daughter stating she would like to confirm tomorrow's appt. Appt confirmed.

## 2023-02-17 ENCOUNTER — Other Ambulatory Visit: Payer: Self-pay

## 2023-02-17 ENCOUNTER — Inpatient Hospital Stay: Payer: Medicare Other | Attending: Hematology and Oncology | Admitting: Hematology and Oncology

## 2023-02-17 VITALS — BP 156/76 | HR 93 | Temp 97.5°F | Resp 18 | Ht 62.0 in | Wt 174.7 lb

## 2023-02-17 DIAGNOSIS — Z7982 Long term (current) use of aspirin: Secondary | ICD-10-CM | POA: Diagnosis not present

## 2023-02-17 DIAGNOSIS — Z79811 Long term (current) use of aromatase inhibitors: Secondary | ICD-10-CM | POA: Insufficient documentation

## 2023-02-17 DIAGNOSIS — C50212 Malignant neoplasm of upper-inner quadrant of left female breast: Secondary | ICD-10-CM | POA: Diagnosis present

## 2023-02-17 DIAGNOSIS — Z170 Estrogen receptor positive status [ER+]: Secondary | ICD-10-CM | POA: Diagnosis not present

## 2023-02-17 DIAGNOSIS — Z7984 Long term (current) use of oral hypoglycemic drugs: Secondary | ICD-10-CM | POA: Diagnosis not present

## 2023-02-17 DIAGNOSIS — Z79899 Other long term (current) drug therapy: Secondary | ICD-10-CM | POA: Insufficient documentation

## 2023-02-17 DIAGNOSIS — D509 Iron deficiency anemia, unspecified: Secondary | ICD-10-CM | POA: Insufficient documentation

## 2023-02-17 MED ORDER — ANASTROZOLE 1 MG PO TABS: 1.0000 mg | ORAL_TABLET | Freq: Every day | ORAL | 3 refills | Status: DC

## 2023-02-17 NOTE — Assessment & Plan Note (Signed)
I suspect 08/15/18: Left Lumpectomy: IDC grade 3, 1.9 cm, Posterior margin positive (Muscle) due to a transected satellite nodule 0.5 cm, 0/3 LN Neg, ER 80%, PR 70%, Ki-67 40%, HER-2 +3+ by IHC with heterogeneity T1cN0 Stage 1A   Posterior Margin: based on discussions with surgery, it is muscle and hence no further surgery is possible.   Plan: 1. Adjuvant Taxol Herceptin weekly x12 completed 11/28/2018 followed by Herceptin maintenance for 1 year to be completed 08/03/2019 2.  Adjuvant radiation therapy completed 02/13/2019 3.  Followed by adjuvant antiestrogen therapy to start January 2021 --------------------------------------------------------------------------------------------------------------------------------------------------------- Current treatment: Adjuvant Herceptin completed 08/03/2019, adjuvant anastrozole started January 2021 Adjuvant radiation completed 02/13/2019 Echocardiogram 11/29/2018: EF 55 to 60%   Iron deficiency anemia:  IV iron given 04/24/2019 currently on oral iron which will be completed in December 2020 hemoglobin improved to 13.7   Anastrozole toxicities: tolerating it well. No side effects Dryness of Mouth: Advised on Biotene   Breast cancer surveillance: Mammogram: 10/07/2022: Benign breast density category C Breast exam 02/17/2023: Benign CT abdomen: 12/02/2022: Appendicitis status post appendectomy MRI lumbar spine 01/16/2023: Degeneration with spinal stenosis   She wants a knee replacement surgery.    Return to clinic in 1 year for follow-up

## 2023-03-03 ENCOUNTER — Other Ambulatory Visit: Payer: Self-pay | Admitting: *Deleted

## 2023-03-03 DIAGNOSIS — M7989 Other specified soft tissue disorders: Secondary | ICD-10-CM

## 2023-03-04 ENCOUNTER — Other Ambulatory Visit (HOSPITAL_COMMUNITY): Payer: Self-pay | Admitting: Orthopedic Surgery

## 2023-03-04 DIAGNOSIS — Z96651 Presence of right artificial knee joint: Secondary | ICD-10-CM

## 2023-03-11 ENCOUNTER — Encounter (HOSPITAL_COMMUNITY)
Admission: RE | Admit: 2023-03-11 | Discharge: 2023-03-11 | Disposition: A | Discharge status: Home / Self Care | Payer: Medicare Other | Source: Ambulatory Visit | Attending: Orthopedic Surgery | Admitting: Orthopedic Surgery

## 2023-03-11 DIAGNOSIS — Z96651 Presence of right artificial knee joint: Secondary | ICD-10-CM | POA: Diagnosis not present

## 2023-03-11 MED ORDER — TECHNETIUM TC 99M MEDRONATE IV KIT
22.0000 | PACK | Freq: Once | INTRAVENOUS | Status: AC | PRN
  Administered 2023-03-11: 22 via INTRAVENOUS

## 2023-03-19 ENCOUNTER — Ambulatory Visit (HOSPITAL_COMMUNITY)
Admission: RE | Admit: 2023-03-19 | Discharge: 2023-03-19 | Disposition: A | Discharge status: Home / Self Care | Payer: Medicare Other | Source: Ambulatory Visit | Attending: Vascular Surgery | Admitting: Vascular Surgery

## 2023-03-19 ENCOUNTER — Ambulatory Visit: Payer: Medicare Other | Admitting: Physician Assistant

## 2023-03-19 VITALS — BP 153/84 | HR 80 | Temp 98.0°F | Resp 16 | Ht 62.0 in | Wt 175.5 lb

## 2023-03-19 DIAGNOSIS — M7989 Other specified soft tissue disorders: Secondary | ICD-10-CM | POA: Insufficient documentation

## 2023-03-19 DIAGNOSIS — I872 Venous insufficiency (chronic) (peripheral): Secondary | ICD-10-CM | POA: Diagnosis not present

## 2023-03-19 NOTE — Progress Notes (Signed)
Requested by:  Farris Has, MD [address] [place],  [place] [address]  Reason for consultation: BLE edema    History of Present Illness   Gloria Mckinney is a 77 y.o. (Dec 28, 1945) female who presents for evaluation of bilateral lower extremity swelling.  She states she has had lower leg swelling for about 6 months now.  When she wakes up in the morning her legs are normal, and after being on her feet throughout the day her legs get swollen.  This makes her legs feel heavy, tired, and itchy.  Over the past 6 months her skin has gotten shiny, dry, and discolored.  She has developed dark patches of skin around her ankles.  She denies any ulcerations or bleeding.  She denies any prior history of DVT.  She elevates her legs above her heart, which does help with swelling.  She occasionally wears knee-high compression stockings which mildly helps.  Past Medical History:  Diagnosis Date   Cancer (HCC)    GERD (gastroesophageal reflux disease)    History of radiation therapy 01/16/19- 02/13/19   left breast 15 fractions of 2.67 Gy for a total of 40.05 Gy. Left breast boost 5 fractions of 2 Gy for a total of 10 Gy   Hypertension    Mixed hyperlipidemia    OA (osteoarthritis)    knee right   Peripheral neuropathy    PVD (peripheral vascular disease) (HCC)    Type 2 diabetes mellitus treated with insulin [facility])    endocrinologist--- dr Armen Pickup Darsey    Past Surgical History:  Procedure Laterality Date   BREAST BIOPSY Bilateral 2000   benign   BREAST LUMPECTOMY WITH RADIOACTIVE SEED AND SENTINEL LYMPH NODE BIOPSY Left 08/15/2018   Procedure: LEFT BREAST LUMPECTOMY WITH RADIOACTIVE SEED AND LEFT AXILLARY DEEP SENTINEL LYMPH NODE BIOPSY INJECT BLUE DYE LEFT BREAST;  Surgeon: Claud Kelp, MD;  Location: [facility];  Service: General;  Laterality: Left;   LAPAROSCOPIC APPENDECTOMY N/A 12/02/2022   Procedure: APPENDECTOMY LAPAROSCOPIC;  Surgeon:  Quentin Ore, MD;  Location: [facility];  Service: General;  Laterality: N/A;   PORT-A-CATH REMOVAL Right 08/09/2019   Procedure: REMOVAL PORT-A-CATH;  Surgeon: Claud Kelp, MD;  Location: [facility];  Service: General;  Laterality: Right;   PORTACATH PLACEMENT Right 08/15/2018   Procedure: INSERTION PORT-A-CATH WITH ULTRASOUND;  Surgeon: Claud Kelp, MD;  Location: [facility];  Service: General;  Laterality: Right;   SHOULDER ARTHROSCOPY Right 06-16-2002   dr graves   SHOULDER OPEN ROTATOR CUFF REPAIR Right 07-28-2000    dr graves   VAGINAL HYSTERECTOMY  1992   with BSO   WRIST SURGERY  2007    Social History   Socioeconomic History   Marital status: Married    Spouse name: Not on file   Number of children: 4   Years of education: Not on file   Highest education level: Not on file  Occupational History   Not on file  Tobacco Use   Smoking status: Never   Smokeless tobacco: Never  Vaping Use   Vaping status: Never Used  Substance and Sexual Activity   Alcohol use: Never   Drug use: Never   Sexual activity: Not on file  Other Topics Concern   Not on file  Social History Narrative   Lives with husband and son.     Social Determinants of Health   Financial Resource Strain: Not on file  Food Insecurity:  No Food Insecurity (12/03/2022)   Hunger Vital Sign    Worried About Running Out of Food in the Last Year: Never true    Ran Out of Food in the Last Year: Never true  Transportation Needs: No Transportation Needs (12/03/2022)   PRAPARE - Administrator, Civil Service (Medical): No    Lack of Transportation (Non-Medical): No  Physical Activity: Not on file  Stress: Not on file  Social Connections: Not on file  Intimate Partner Violence: Not At Risk (12/03/2022)   Humiliation, Afraid, Rape, and Kick questionnaire    Fear of Current or Ex-Partner: No    Emotionally Abused: No    Physically Abused: No    Sexually Abused: No     Family History  Problem Relation Age of Onset   Hypertension Mother    CVA Mother    Heart disease Mother    Asthma Father    Hypertension Sister    Diabetes Sister    Heart disease Brother    Hypertension Sister    Diabetes Sister    Hypertension Sister    Diabetes Sister    Healthy Son    Healthy Daughter    Healthy Daughter    Healthy Daughter    Breast cancer Neg Hx     Current Outpatient Medications  Medication Sig Dispense Refill   anastrozole (ARIMIDEX) 1 MG tablet Take 1 tablet (1 mg total) by mouth daily. 90 tablet 3   aspirin EC 81 MG tablet Take 81 mg by mouth daily.     Calcium Carb-Cholecalciferol (CALCIUM + VITAMIN D3 PO) Take 1 tablet by mouth daily.     EXFORGE 5-320 MG tablet Take 1 tablet by mouth daily.     glimepiride (AMARYL) 4 MG tablet Take 4 mg by mouth 2 (two) times daily.     hydrochlorothiazide (HYDRODIURIL) 25 MG tablet Take 25 mg by mouth daily.     insulin degludec (TRESIBA) 100 UNIT/ML FlexTouch Pen Inject 45 Units into the skin daily.     metFORMIN (GLUCOPHAGE) 1000 MG tablet Take 1,000 mg by mouth 2 (two) times daily with a meal.     omeprazole (PRILOSEC) 40 MG capsule Take 40 mg by mouth every morning.      simvastatin (ZOCOR) 40 MG tablet Take 40 mg by mouth daily.     No current facility-administered medications for this visit.   Facility-Administered Medications Ordered in Other Visits  Medication Dose Route Frequency Provider Last Rate Last Admin   sodium chloride flush (NS) 0.9 % injection 10 mL  10 mL Intravenous PRN Serena Croissant, MD   10 mL at 09/09/18 1114    Allergies  Allergen Reactions   Penicillins Swelling and Other (See Comments)    Has patient had a PCN reaction causing immediate rash, facial/tongue/throat swelling, SOB or lightheadedness with hypotension: Yes Has patient had a PCN reaction causing severe rash involving mucus membranes or skin necrosis: No Has patient had a PCN reaction that required hospitalization:  No Has patient had a PCN reaction occurring within the last 10 years: No If all of the above answers are "NO", then may proceed with Cephalosporin use.    Cephalexin Itching   Statins Other (See Comments)    MYALGIAS    REVIEW OF SYSTEMS (negative unless checked):   Cardiac:  []  Chest pain or chest pressure? []  Shortness of breath upon activity? []  Shortness of breath when lying flat? []  Irregular heart rhythm?  Vascular:  []  Pain in  calf, thigh, or hip brought on by walking? []  Pain in feet at night that wakes you up from your sleep? []  Blood clot in your veins? [x]  Leg swelling?  Pulmonary:  []  Oxygen at home? []  Productive cough? []  Wheezing?  Neurologic:  []  Sudden weakness in arms or legs? []  Sudden numbness in arms or legs? []  Sudden onset of difficult speaking or slurred speech? []  Temporary loss of vision in one eye? []  Problems with dizziness?  Gastrointestinal:  []  Blood in stool? []  Vomited blood?  Genitourinary:  []  Burning when urinating? []  Blood in urine?  Psychiatric:  []  Major depression  Hematologic:  []  Bleeding problems? []  Problems with blood clotting?  Dermatologic:  []  Rashes or ulcers?  Constitutional:  []  Fever or chills?  Ear/Nose/Throat:  []  Change in hearing? []  Nose bleeds? []  Sore throat?  Musculoskeletal:  []  Back pain? []  Joint pain? []  Muscle pain?   Physical Examination     Vitals:   03/19/23 1233  BP: (!) 153/84  Pulse: 80  Resp: 16  Temp: 98 F (36.7 C)  TempSrc: Temporal  SpO2: 97%  Weight: 175 lb 8 oz (79.6 kg)  Height: 5\' 2"  (1.575 m)   Body mass index is 32.1 kg/m.  General:  WDWN in NAD; vital signs documented above Gait: Not observed HENT: WNL, normocephalic Pulmonary: normal non-labored breathing , without Rales, rhonchi,  wheezing Cardiac: regular Abdomen: soft, NT, no masses Skin: without rashes Vascular Exam/Pulses: 2+ DP pulses Extremities: without varicose veins, without  reticular veins, with 2+ BLE edema, with stasis pigmentation around both ankles, without lipodermatosclerosis, without ulcers Musculoskeletal: no muscle wasting or atrophy  Neurologic: A&O X 3;  No focal weakness or paresthesias are detected Psychiatric:  The pt has Normal affect.  Non-invasive Vascular Imaging   RLE Venous Insufficiency Duplex (03/19/2023):  +--------------+---------+------+-----------+------------+-------------+  RIGHT        Reflux NoRefluxReflux TimeDiameter cmsComments                               Yes                                        +--------------+---------+------+-----------+------------+-------------+  CFV          no                                                   +--------------+---------+------+-----------+------------+-------------+  FV mid        no                                                   +--------------+---------+------+-----------+------------+-------------+  Popliteal    no                                                   +--------------+---------+------+-----------+------------+-------------+  GSV at SFJ              yes    >  500 ms      0.59                   +--------------+---------+------+-----------+------------+-------------+  GSV prox thigh          yes    >500 ms      0.51                   +--------------+---------+------+-----------+------------+-------------+  GSV mid thigh no                            0.45                   +--------------+---------+------+-----------+------------+-------------+  GSV dist thighno                            0.44                   +--------------+---------+------+-----------+------------+-------------+  GSV at knee   no                            0.36    out of fascia  +--------------+---------+------+-----------+------------+-------------+  SSV Pop Fossa no                            0.22                    +--------------+---------+------+-----------+------------+-------------+    Medical Decision Making   Gloria Mckinney is a 77 y.o. female who presents with bilateral lower extremity swelling  Based on the patient's duplex, there is reflux in the right greater saphenous vein at the saphenofemoral junction and proximal thigh.  The remainder of the deep and superficial venous system is competent.  There is no evidence of DVT or SVT.  Unfortunately she would not be a candidate for ablation since her GSV is less than 5 mm throughout the thigh. She describes a 6 month history of bilateral lower leg swelling that is worse at the end of the day. This has caused her skin to thin and become shiny and itchy. She has also developed areas of stasis pigmentation. She has tried leg elevation and intermittent use of compression stockings On exam she has 2+ edema of bilateral lower legs with stasis pigmentation around the ankles. She has no active ulcerations I discussed with the patient she would benefit from daily use of well-fitted 20 to 30 mmHg knee-high compression stockings, leg elevation, exercise, and weight management. These things should help control her leg swelling. She can follow up with our office as needed   Ernestene Mention, PA-C [facility] Office: [phone]  03/19/2023, 12:17 PM  Clinic MD: Karin Lieu

## 2023-03-29 NOTE — Therapy (Signed)
OUTPATIENT PHYSICAL THERAPY LOWER EXTREMITY EVALUATION   Patient Name: Gloria Mckinney MRN: [id number] DOB:1946/04/10, 77 y.o., female Today's Date: 03/30/2023  END OF SESSION:  PT End of Session - 03/30/23 1721     Visit Number 1    Date for PT Re-Evaluation 06/22/23    Authorization Type Medicare    PT Start Time 1720    PT Stop Time 1800    PT Time Calculation (min) 40 min    Activity Tolerance Patient tolerated treatment well             Past Medical History:  Diagnosis Date   Cancer (HCC)    GERD (gastroesophageal reflux disease)    History of radiation therapy 01/16/19- 02/13/19   left breast 15 fractions of 2.67 Gy for a total of 40.05 Gy. Left breast boost 5 fractions of 2 Gy for a total of 10 Gy   Hypertension    Mixed hyperlipidemia    OA (osteoarthritis)    knee right   Peripheral neuropathy    PVD (peripheral vascular disease) (HCC)    Type 2 diabetes mellitus treated with insulin [facility])    endocrinologist--- dr Armen Pickup [place]   Past Surgical History:  Procedure Laterality Date   BREAST BIOPSY Bilateral 2000   benign   BREAST LUMPECTOMY WITH RADIOACTIVE SEED AND SENTINEL LYMPH NODE BIOPSY Left 08/15/2018   Procedure: LEFT BREAST LUMPECTOMY WITH RADIOACTIVE SEED AND LEFT AXILLARY DEEP SENTINEL LYMPH NODE BIOPSY INJECT BLUE DYE LEFT BREAST;  Surgeon: Claud Kelp, MD;  Location: [facility];  Service: General;  Laterality: Left;   LAPAROSCOPIC APPENDECTOMY N/A 12/02/2022   Procedure: APPENDECTOMY LAPAROSCOPIC;  Surgeon: Quentin Ore, MD;  Location: [facility];  Service: General;  Laterality: N/A;   PORT-A-CATH REMOVAL Right 08/09/2019   Procedure: REMOVAL PORT-A-CATH;  Surgeon: Claud Kelp, MD;  Location: [facility];  Service: General;  Laterality: Right;   PORTACATH PLACEMENT Right 08/15/2018   Procedure: INSERTION PORT-A-CATH WITH ULTRASOUND;  Surgeon: Claud Kelp, MD;  Location: [facility];   Service: General;  Laterality: Right;   SHOULDER ARTHROSCOPY Right 06-16-2002   dr graves   SHOULDER OPEN ROTATOR CUFF REPAIR Right 07-28-2000    dr graves   VAGINAL HYSTERECTOMY  1992   with BSO   WRIST SURGERY  2007   Patient Active Problem List   Diagnosis Date Noted   Acute appendicitis 12/02/2022   Port-A-Cath in place 09/19/2018   Malignant neoplasm of upper-inner quadrant of left breast in female, estrogen receptor positive (HCC) 07/27/2018   Dyspnea on exertion 10/21/2016   Type 2 diabetes mellitus without complication, without long-term current use of insulin (HCC) 10/21/2016   Dyslipidemia 10/21/2016    PCP: Mila Palmer  REFERRING PROVIDER: Trudee Grip  REFERRING DIAG:  [phone] (ICD-10-CM) - Presence of right artificial knee joint    THERAPY DIAG:  Chronic pain of right knee  Stiffness of right knee, not elsewhere classified  Other abnormalities of gait and mobility  Localized edema  Rationale for Evaluation and Treatment: Rehabilitation  ONSET DATE: 04/07/22  SUBJECTIVE:   SUBJECTIVE STATEMENT: I am okay but not good.   PERTINENT HISTORY: R TKA 04/07/22, Appendectomy 12/02/22  PAIN:  Are you having pain? Yes: NPRS scale: 4/10 Pain location: R knee, behind the knee and below and above Pain description: sharp Aggravating factors: sitting, standing, walking more than 30 mins Relieving factors: Tylenol   PRECAUTIONS: None  RED FLAGS: None   WEIGHT BEARING RESTRICTIONS: No  FALLS:  Has patient fallen in last 6 months? No  LIVING ENVIRONMENT: Lives with: lives with their family Lives in: House/apartment Stairs: Yes: Internal: 15 steps; on right going up  OCCUPATION: Retired  PLOF: Independent  PATIENT GOALS: no pain   OBJECTIVE:   DIAGNOSTIC FINDINGS: N/A   COGNITION: Overall cognitive status: Within functional limits for tasks assessed     SENSATION: WFL  EDEMA:  Still has some edema in R knee  MUSCLE LENGTH: Hamstrings:  some tightness in BLE  POSTURE: rounded shoulders  PALPATION: No TTP  LOWER EXTREMITY ROM: grossly WFL, R knee pain with hip flexion and end range extension  R knee 105 R knee ext -5d  LOWER EXTREMITY MMT: grossly 5/5. Some weakness in R quad and R hip flexor 4/5   FUNCTIONAL TESTS:  5 times sit to stand: 23.99s  Timed up and go (TUG): 18.41s   GAIT: Distance walked: in clinic distances Assistive device utilized: None Level of assistance: Modified independence Comments: antalgic gait, decreased step length and stance time on RLE    TODAY'S TREATMENT:                                                                                                                              DATE: EVAL 03/30/23    PATIENT EDUCATION:  Education details: POC and HEP  Person educated: Patient Education method: Explanation Education comprehension: verbalized understanding  HOME EXERCISE PROGRAM: Access Code: [email or web address] URL: [email or web address] Date: 03/30/2023 Prepared by: Cassie Freer  Exercises - Seated Hamstring Stretch  - 1 x daily - 7 x weekly - 2 reps - 30 hold - Sit to Stand  - 1 x daily - 7 x weekly - 2 sets - 10 reps - Heel Raises with Counter Support  - 1 x daily - 7 x weekly - 2 sets - 10 reps - Standing March with Counter Support  - 1 x daily - 7 x weekly - 2 sets - 10 reps - Standing Hip Abduction with Unilateral Counter Support  - 1 x daily - 7 x weekly - 2 sets - 10 reps  ASSESSMENT:  CLINICAL IMPRESSION: Patient is a 77 y.o. female who was seen today for physical therapy evaluation and treatment for R knee pain. She has R TKA one year ago on 03/25/22 and was here for PT. Was seen for 19 visits. She still has some ROM deficits but they are much improved from last time she was here. Measurements today were for 105d flexion and 5d extension lag. She also still has some swelling in her R knee and reports it is still numb on the lateral side. She continues to walk  with an antalgic gait pattern and c/o of pain in her R knee. Patient will benefit from skilled PT to address her R knee impairments to be able to improve her locomotion mobility.   OBJECTIVE IMPAIRMENTS: Abnormal gait, difficulty walking, decreased ROM, decreased strength, increased edema, and  pain.   ACTIVITY LIMITATIONS: stairs and locomotion level  PARTICIPATION LIMITATIONS: shopping, community activity, and yard work  [facility] POTENTIAL: Good  CLINICAL DECISION MAKING: Stable/uncomplicated  EVALUATION COMPLEXITY: Low  GOALS: Goals reviewed with patient? Yes  SHORT TERM GOALS: Target date: 05/11/23  Patient will be independent with initial HEP. Goal status: INITIAL  2.  Patient will improve TUG score to <14s  Baseline: 18.41s  Goal status: INITIAL   LONG TERM GOALS: Target date: 06/22/23  Patient will be independent with advanced/ongoing HEP to improve outcomes and carryover.  Goal status: INITIAL  2.  Patient will report at least 75% improvement in R knee pain to improve QOL. Baseline: 4/10 Goal status: INITIAL  3.  Patient will demonstrate improved functional LE strength by 5xSTS <15s. Baseline: 23.99  Goal status: INITIAL  4.  Patient will be able to ambulate 600' with LRAD and normal gait pattern without increased pain to access community.  Baseline: antalgic gait Goal status: INITIAL  5. Patient will be able to increase standing/sitting/walking tolerance more than 30 mins   Baseline: pain after 30 mins  Goal status: INITIAL  PLAN:  PT FREQUENCY: 2x/week  PT DURATION: 12 weeks  PLANNED INTERVENTIONS: Therapeutic exercises, Therapeutic activity, Neuromuscular re-education, Balance training, Gait training, Patient/Family education, Self Care, Joint mobilization, Stair training, Cryotherapy, Moist heat, Taping, Vasopneumatic device, Ionotophoresis 4mg /ml Dexamethasone, and Manual therapy  PLAN FOR NEXT SESSION: R knee strengthening    Cassie Freer,  PT 03/30/2023, 5:59 PM

## 2023-03-30 ENCOUNTER — Ambulatory Visit: Payer: Medicare Other | Attending: Orthopedic Surgery

## 2023-03-30 DIAGNOSIS — M25661 Stiffness of right knee, not elsewhere classified: Secondary | ICD-10-CM | POA: Diagnosis present

## 2023-03-30 DIAGNOSIS — R2689 Other abnormalities of gait and mobility: Secondary | ICD-10-CM | POA: Diagnosis present

## 2023-03-30 DIAGNOSIS — G8929 Other chronic pain: Secondary | ICD-10-CM | POA: Diagnosis present

## 2023-03-30 DIAGNOSIS — M6281 Muscle weakness (generalized): Secondary | ICD-10-CM | POA: Insufficient documentation

## 2023-03-30 DIAGNOSIS — M25561 Pain in right knee: Secondary | ICD-10-CM | POA: Diagnosis present

## 2023-03-30 DIAGNOSIS — R600 Localized edema: Secondary | ICD-10-CM | POA: Insufficient documentation

## 2023-04-05 ENCOUNTER — Encounter: Payer: Self-pay | Admitting: Physical Therapy

## 2023-04-05 ENCOUNTER — Ambulatory Visit: Payer: Medicare Other | Admitting: Physical Therapy

## 2023-04-05 DIAGNOSIS — M25561 Pain in right knee: Secondary | ICD-10-CM | POA: Diagnosis not present

## 2023-04-05 DIAGNOSIS — R600 Localized edema: Secondary | ICD-10-CM

## 2023-04-05 DIAGNOSIS — M6281 Muscle weakness (generalized): Secondary | ICD-10-CM

## 2023-04-05 DIAGNOSIS — G8929 Other chronic pain: Secondary | ICD-10-CM

## 2023-04-05 DIAGNOSIS — M25661 Stiffness of right knee, not elsewhere classified: Secondary | ICD-10-CM

## 2023-04-05 DIAGNOSIS — R2689 Other abnormalities of gait and mobility: Secondary | ICD-10-CM

## 2023-04-05 NOTE — Therapy (Signed)
OUTPATIENT PHYSICAL THERAPY LOWER EXTREMITY EVALUATION   Patient Name: Gloria Mckinney MRN: [id number] DOB:[birth year], 77 y.o., female Today's Date: 04/05/2023  END OF SESSION:  PT End of Session - 04/05/23 1800     Visit Number 2    Date for PT Re-Evaluation 06/22/23    PT Start Time 1557    PT Stop Time 1640    PT Time Calculation (min) 43 min    Activity Tolerance Patient tolerated treatment well    Behavior During Therapy [facility] for tasks assessed/performed              Past Medical History:  Diagnosis Date   Cancer (HCC)    GERD (gastroesophageal reflux disease)    History of radiation therapy 01/16/19- 02/13/19   left breast 15 fractions of 2.67 Gy for a total of 40.05 Gy. Left breast boost 5 fractions of 2 Gy for a total of 10 Gy   Hypertension    Mixed hyperlipidemia    OA (osteoarthritis)    knee right   Peripheral neuropathy    PVD (peripheral vascular disease) (HCC)    Type 2 diabetes mellitus treated with insulin [facility])    endocrinologist--- dr Armen Pickup [place]   Past Surgical History:  Procedure Laterality Date   BREAST BIOPSY Bilateral 2000   benign   BREAST LUMPECTOMY WITH RADIOACTIVE SEED AND SENTINEL LYMPH NODE BIOPSY Left 08/15/2018   Procedure: LEFT BREAST LUMPECTOMY WITH RADIOACTIVE SEED AND LEFT AXILLARY DEEP SENTINEL LYMPH NODE BIOPSY INJECT BLUE DYE LEFT BREAST;  Surgeon: Claud Kelp, MD;  Location: [facility];  Service: General;  Laterality: Left;   LAPAROSCOPIC APPENDECTOMY N/A 12/02/2022   Procedure: APPENDECTOMY LAPAROSCOPIC;  Surgeon: Quentin Ore, MD;  Location: [facility];  Service: General;  Laterality: N/A;   PORT-A-CATH REMOVAL Right 08/09/2019   Procedure: REMOVAL PORT-A-CATH;  Surgeon: Claud Kelp, MD;  Location: [facility];  Service: General;  Laterality: Right;   PORTACATH PLACEMENT Right 08/15/2018   Procedure: INSERTION PORT-A-CATH WITH ULTRASOUND;  Surgeon: Claud Kelp, MD;  Location:  [facility];  Service: General;  Laterality: Right;   SHOULDER ARTHROSCOPY Right 06-16-2002   dr graves   SHOULDER OPEN ROTATOR CUFF REPAIR Right 07-28-2000    dr graves   VAGINAL HYSTERECTOMY  1992   with BSO   WRIST SURGERY  2007   Patient Active Problem List   Diagnosis Date Noted   Acute appendicitis 12/02/2022   Port-A-Cath in place 09/19/2018   Malignant neoplasm of upper-inner quadrant of left breast in female, estrogen receptor positive (HCC) 07/27/2018   Dyspnea on exertion 10/21/2016   Type 2 diabetes mellitus without complication, without long-term current use of insulin (HCC) 10/21/2016   Dyslipidemia 10/21/2016    PCP: Mila Palmer  REFERRING PROVIDER: Trudee Grip  REFERRING DIAG:  [phone] (ICD-10-CM) - Presence of right artificial knee joint    THERAPY DIAG:  Chronic pain of right knee  Stiffness of right knee, not elsewhere classified  Other abnormalities of gait and mobility  Localized edema  Muscle weakness (generalized)  Acute pain of right knee  Rationale for Evaluation and Treatment: Rehabilitation  ONSET DATE: 04/07/22  SUBJECTIVE:   SUBJECTIVE STATEMENT: Patient reports that the knee feels "loose". She still has to take steps one at time because the R one does not feel stable. Clicking palpated when she shakes R knee side to side in sitting.  PERTINENT HISTORY: R TKA 04/07/22, Appendectomy 12/02/22  PAIN:  Are you having pain?  Yes: NPRS scale: 4/10 Pain location: R knee, behind the knee and below and above Pain description: sharp Aggravating factors: sitting, standing, walking more than 30 mins Relieving factors: Tylenol   PRECAUTIONS: None  RED FLAGS: None   WEIGHT BEARING RESTRICTIONS: No  FALLS:  Has patient fallen in last 6 months? No  LIVING ENVIRONMENT: Lives with: lives with their family Lives in: House/apartment Stairs: Yes: Internal: 15 steps; on right going up  OCCUPATION: Retired  PLOF:  Independent  PATIENT GOALS: no pain   OBJECTIVE:   DIAGNOSTIC FINDINGS: N/A   COGNITION: Overall cognitive status: Within functional limits for tasks assessed     SENSATION: WFL  EDEMA:  Still has some edema in R knee  MUSCLE LENGTH: Hamstrings: some tightness in BLE  POSTURE: rounded shoulders  PALPATION: No TTP  LOWER EXTREMITY ROM: grossly WFL, R knee pain with hip flexion and end range extension  R knee 105 R knee ext -5d  LOWER EXTREMITY MMT: grossly 5/5. Some weakness in R quad and R hip flexor 4/5   FUNCTIONAL TESTS:  5 times sit to stand: 23.99s  Timed up and go (TUG): 18.41s   GAIT: Distance walked: in clinic distances Assistive device utilized: None Level of assistance: Modified independence Comments: antalgic gait, decreased step length and stance time on RLE    TODAY'S TREATMENT:                                                                                                                              DATE:  04/05/23 NuStep L5 x 6 minutes Seated knee flexion, BLE 20#, 2 x 10 reps, then RLE only, 10#, 2 x 10 reps Seated knee ext, BLE, 5#, 2 x 10 reps, then eccentric with RLE 5#, x 10 reps, lag noted. Standing side to side step against G tband at the knees x 10 to each side Standing TKE against G tband on R, 2 x 10 reps, VC to maintain her weight on her RLE. Heel raises on step x 10 Heel taps with L heel, 2" step, and return, with BUE support on stair rails, CGA-fearful, but able to complete 10 reps, mildly shaky. Treadmill pushes and pulls x 10 each, some pain with the push.  EVAL 03/30/23   PATIENT EDUCATION:  Education details: POC and HEP  Person educated: Patient Education method: Explanation Education comprehension: verbalized understanding  HOME EXERCISE PROGRAM: Access Code: [email or web address] URL: [email or web address] Date: 03/30/2023 Prepared by: Cassie Freer  Exercises - Seated Hamstring Stretch  - 1 x daily - 7 x weekly -  2 reps - 30 hold - Sit to Stand  - 1 x daily - 7 x weekly - 2 sets - 10 reps - Heel Raises with Counter Support  - 1 x daily - 7 x weekly - 2 sets - 10 reps - Standing March with Counter Support  - 1 x daily - 7 x weekly - 2 sets - 10 reps -  Standing Hip Abduction with Unilateral Counter Support  - 1 x daily - 7 x weekly - 2 sets - 10 reps  ASSESSMENT:  CLINICAL IMPRESSION: Patient is a 77 y.o. female who was seen today for physical therapy evaluation and treatment for R knee pain. She has R TKA one year ago on 03/25/22 and was here for PT. Was seen for 19 visits. She still has some ROM deficits but they are much improved from last time she was here. Measurements today were for 105d flexion and 5d extension lag. She also still has some swelling in her R knee and reports it is still numb on the lateral side. She continues to walk with an antalgic gait pattern and c/o of pain in her R knee.  Patient performed multiple strengthening activities focusing on isolated quad and HS strength on R as well as functional strengthening to promote improved stability throughout RLE.   OBJECTIVE IMPAIRMENTS: Abnormal gait, difficulty walking, decreased ROM, decreased strength, increased edema, and pain.   ACTIVITY LIMITATIONS: stairs and locomotion level  PARTICIPATION LIMITATIONS: shopping, community activity, and yard work  [facility] POTENTIAL: Good  CLINICAL DECISION MAKING: Stable/uncomplicated  EVALUATION COMPLEXITY: Low  GOALS: Goals reviewed with patient? Yes  SHORT TERM GOALS: Target date: 05/11/23  Patient will be independent with initial HEP. Goal status: 04/05/23-met  2.  Patient will improve TUG score to <14s  Baseline: 18.41s  Goal status: INITIAL   LONG TERM GOALS: Target date: 06/22/23  Patient will be independent with advanced/ongoing HEP to improve outcomes and carryover.  Goal status: INITIAL  2.  Patient will report at least 75% improvement in R knee pain to improve  QOL. Baseline: 4/10 Goal status: 04/05/23-4/10 max- ongoing  3.  Patient will demonstrate improved functional LE strength by 5xSTS <15s. Baseline: 23.99  Goal status: INITIAL  4.  Patient will be able to ambulate 600' with LRAD and normal gait pattern without increased pain to access community.  Baseline: antalgic gait Goal status: INITIAL  5. Patient will be able to increase standing/sitting/walking tolerance more than 30 mins   Baseline: pain after 30 mins  Goal status: INITIAL  PLAN:  PT FREQUENCY: 2x/week  PT DURATION: 12 weeks  PLANNED INTERVENTIONS: Therapeutic exercises, Therapeutic activity, Neuromuscular re-education, Balance training, Gait training, Patient/Family education, Self Care, Joint mobilization, Stair training, Cryotherapy, Moist heat, Taping, Vasopneumatic device, Ionotophoresis 4mg /ml Dexamethasone, and Manual therapy  PLAN FOR NEXT SESSION: R knee strengthening 04/05/23-update HEP   Iona Beard, DPT 04/05/2023, 6:44 PM

## 2023-04-14 ENCOUNTER — Ambulatory Visit: Payer: Medicare Other | Attending: Orthopedic Surgery | Admitting: Physical Therapy

## 2023-04-14 ENCOUNTER — Encounter: Payer: Self-pay | Admitting: Physical Therapy

## 2023-04-14 DIAGNOSIS — G8929 Other chronic pain: Secondary | ICD-10-CM | POA: Insufficient documentation

## 2023-04-14 DIAGNOSIS — R600 Localized edema: Secondary | ICD-10-CM | POA: Insufficient documentation

## 2023-04-14 DIAGNOSIS — M25661 Stiffness of right knee, not elsewhere classified: Secondary | ICD-10-CM | POA: Diagnosis present

## 2023-04-14 DIAGNOSIS — M6281 Muscle weakness (generalized): Secondary | ICD-10-CM | POA: Insufficient documentation

## 2023-04-14 DIAGNOSIS — R2689 Other abnormalities of gait and mobility: Secondary | ICD-10-CM | POA: Diagnosis present

## 2023-04-14 DIAGNOSIS — M25561 Pain in right knee: Secondary | ICD-10-CM | POA: Insufficient documentation

## 2023-04-14 NOTE — Therapy (Signed)
OUTPATIENT PHYSICAL THERAPY LOWER EXTREMITY EVALUATION   Patient Name: Gloria Mckinney MRN: [id number] DOB:1945/11/01, 77 y.o., female Today's Date: 04/14/2023  END OF SESSION:  PT End of Session - 04/14/23 1807     Visit Number 3    Date for PT Re-Evaluation 06/22/23    PT Start Time 1804    PT Stop Time 1842    PT Time Calculation (min) 38 min    Activity Tolerance Patient tolerated treatment well    Behavior During Therapy [facility] for tasks assessed/performed               Past Medical History:  Diagnosis Date   Cancer (HCC)    GERD (gastroesophageal reflux disease)    History of radiation therapy 01/16/19- 02/13/19   left breast 15 fractions of 2.67 Gy for a total of 40.05 Gy. Left breast boost 5 fractions of 2 Gy for a total of 10 Gy   Hypertension    Mixed hyperlipidemia    OA (osteoarthritis)    knee right   Peripheral neuropathy    PVD (peripheral vascular disease) (HCC)    Type 2 diabetes mellitus treated with insulin [facility])    endocrinologist--- dr Armen Pickup [place]   Past Surgical History:  Procedure Laterality Date   BREAST BIOPSY Bilateral 2000   benign   BREAST LUMPECTOMY WITH RADIOACTIVE SEED AND SENTINEL LYMPH NODE BIOPSY Left 08/15/2018   Procedure: LEFT BREAST LUMPECTOMY WITH RADIOACTIVE SEED AND LEFT AXILLARY DEEP SENTINEL LYMPH NODE BIOPSY INJECT BLUE DYE LEFT BREAST;  Surgeon: Claud Kelp, MD;  Location: [facility];  Service: General;  Laterality: Left;   LAPAROSCOPIC APPENDECTOMY N/A 12/02/2022   Procedure: APPENDECTOMY LAPAROSCOPIC;  Surgeon: Quentin Ore, MD;  Location: [facility];  Service: General;  Laterality: N/A;   PORT-A-CATH REMOVAL Right 08/09/2019   Procedure: REMOVAL PORT-A-CATH;  Surgeon: Claud Kelp, MD;  Location: [facility];  Service: General;  Laterality: Right;   PORTACATH PLACEMENT Right 08/15/2018   Procedure: INSERTION PORT-A-CATH WITH ULTRASOUND;  Surgeon: Claud Kelp, MD;  Location:  [facility];  Service: General;  Laterality: Right;   SHOULDER ARTHROSCOPY Right 06-16-2002   dr graves   SHOULDER OPEN ROTATOR CUFF REPAIR Right 07-28-2000    dr graves   VAGINAL HYSTERECTOMY  1992   with BSO   WRIST SURGERY  2007   Patient Active Problem List   Diagnosis Date Noted   Acute appendicitis 12/02/2022   Port-A-Cath in place 09/19/2018   Malignant neoplasm of upper-inner quadrant of left breast in female, estrogen receptor positive (HCC) 07/27/2018   Dyspnea on exertion 10/21/2016   Type 2 diabetes mellitus without complication, without long-term current use of insulin (HCC) 10/21/2016   Dyslipidemia 10/21/2016    PCP: Mila Palmer  REFERRING PROVIDER: Trudee Grip  REFERRING DIAG:  [phone] (ICD-10-CM) - Presence of right artificial knee joint    THERAPY DIAG:  Chronic pain of right knee  Stiffness of right knee, not elsewhere classified  Other abnormalities of gait and mobility  Muscle weakness (generalized)  Localized edema  Rationale for Evaluation and Treatment: Rehabilitation  ONSET DATE: 04/07/22  SUBJECTIVE:   SUBJECTIVE STATEMENT: Patient reports that the knee feels "loose". She still has to take steps one at time because the R one does not feel stable. Clicking palpated when she shakes R knee side to side in sitting.  PERTINENT HISTORY: R TKA 04/07/22, Appendectomy 12/02/22  PAIN:  Are you having pain? Yes: NPRS scale: 4/10 Pain  location: R knee, behind the knee and below and above Pain description: sharp Aggravating factors: sitting, standing, walking more than 30 mins Relieving factors: Tylenol   PRECAUTIONS: None  RED FLAGS: None   WEIGHT BEARING RESTRICTIONS: No  FALLS:  Has patient fallen in last 6 months? No  LIVING ENVIRONMENT: Lives with: lives with their family Lives in: House/apartment Stairs: Yes: Internal: 15 steps; on right going up  OCCUPATION: Retired  PLOF: Independent  PATIENT GOALS: no  pain   OBJECTIVE:   DIAGNOSTIC FINDINGS: N/A   COGNITION: Overall cognitive status: Within functional limits for tasks assessed     SENSATION: WFL  EDEMA:  Still has some edema in R knee  MUSCLE LENGTH: Hamstrings: some tightness in BLE  POSTURE: rounded shoulders  PALPATION: No TTP  LOWER EXTREMITY ROM: grossly WFL, R knee pain with hip flexion and end range extension  R knee 105 R knee ext -5d  LOWER EXTREMITY MMT: grossly 5/5. Some weakness in R quad and R hip flexor 4/5   FUNCTIONAL TESTS:  5 times sit to stand: 23.99s  Timed up and go (TUG): 18.41s   GAIT: Distance walked: in clinic distances Assistive device utilized: None Level of assistance: Modified independence Comments: antalgic gait, decreased step length and stance time on RLE    TODAY'S TREATMENT:                                                                                                                              DATE:  04/14/23 NuStep L5 x 6 minutes Mini squats x 15  B side to side step against G tband x 10 reps each way Attempted step ups, but patient felt popping in her knee, so deferred Seated knee flex and ext against G tband 10 Standing TKE against ball on wall 10  04/05/23 NuStep L5 x 6 minutes Seated knee flexion, BLE 20#, 2 x 10 reps, then RLE only, 10#, 2 x 10 reps Seated knee ext, BLE, 5#, 2 x 10 reps, then eccentric with RLE 5#, x 10 reps, lag noted. Standing side to side step against G tband at the knees x 10 to each side Standing TKE against G tband on R, 2 x 10 reps, VC to maintain her weight on her RLE. Heel raises on step x 10 Heel taps with L heel, 2" step, and return, with BUE support on stair rails, CGA-fearful, but able to complete 10 reps, mildly shaky. Treadmill pushes and pulls x 10 each, some pain with the push.  EVAL 03/30/23   PATIENT EDUCATION:  Education details: POC and HEP  Person educated: Patient Education method: Explanation Education comprehension:  verbalized understanding  HOME EXERCISE PROGRAM: Access Code: [email or web address] URL: [email or web address] Date: 03/30/2023 Prepared by: Cassie Freer  Exercises - Seated Hamstring Stretch  - 1 x daily - 7 x weekly - 2 reps - 30 hold - Sit to Stand  - 1 x daily - 7 x weekly -  2 sets - 10 reps - Heel Raises with Counter Support  - 1 x daily - 7 x weekly - 2 sets - 10 reps - Standing March with Counter Support  - 1 x daily - 7 x weekly - 2 sets - 10 reps - Standing Hip Abduction with Unilateral Counter Support  - 1 x daily - 7 x weekly - 2 sets - 10 reps  ASSESSMENT:  CLINICAL IMPRESSION: Patient is a 77 y.o. female who was seen today for physical therapy evaluation and treatment for R knee pain. She has R TKA one year ago on 03/25/22 and was here for PT. Was seen for 19 visits. She still has some ROM deficits but they are much improved from last time she was here. Measurements today were for 105d flexion and 5d extension lag. She also still has some swelling in her R knee and reports it is still numb on the lateral side. She continues to walk with an antalgic gait pattern and c/o of pain in her R knee.  Progressed with strength and stability exercises for knee and hips. Felt instability and popping when attempting step ups, so deferred. HEP updated to include additional strengthening.   OBJECTIVE IMPAIRMENTS: Abnormal gait, difficulty walking, decreased ROM, decreased strength, increased edema, and pain.   ACTIVITY LIMITATIONS: stairs and locomotion level  PARTICIPATION LIMITATIONS: shopping, community activity, and yard work  [facility] POTENTIAL: Good  CLINICAL DECISION MAKING: Stable/uncomplicated  EVALUATION COMPLEXITY: Low  GOALS: Goals reviewed with patient? Yes  SHORT TERM GOALS: Target date: 05/11/23  Patient will be independent with initial HEP. Goal status: 04/05/23-met  2.  Patient will improve TUG score to <14s  Baseline: 18.41s  Goal status: 04/14/23-ongoing   LONG  TERM GOALS: Target date: 06/22/23  Patient will be independent with advanced/ongoing HEP to improve outcomes and carryover.  Goal status: INITIAL  2.  Patient will report at least 75% improvement in R knee pain to improve QOL. Baseline: 4/10 Goal status: 04/05/23-4/10 max- ongoing  3.  Patient will demonstrate improved functional LE strength by 5xSTS <15s. Baseline: 23.99  Goal status: INITIAL  4.  Patient will be able to ambulate 600' with LRAD and normal gait pattern without increased pain to access community.  Baseline: antalgic gait Goal status: INITIAL  5. Patient will be able to increase standing/sitting/walking tolerance more than 30 mins   Baseline: pain after 30 mins  Goal status: INITIAL  PLAN:  PT FREQUENCY: 2x/week  PT DURATION: 12 weeks  PLANNED INTERVENTIONS: Therapeutic exercises, Therapeutic activity, Neuromuscular re-education, Balance training, Gait training, Patient/Family education, Self Care, Joint mobilization, Stair training, Cryotherapy, Moist heat, Taping, Vasopneumatic device, Ionotophoresis 4mg /ml Dexamethasone, and Manual therapy  PLAN FOR NEXT SESSION: R knee strengthening 04/14/23-proprioception   Iona Beard, DPT 04/14/2023, 6:41 PM

## 2023-04-19 ENCOUNTER — Ambulatory Visit: Payer: Medicare Other

## 2023-04-19 DIAGNOSIS — R2689 Other abnormalities of gait and mobility: Secondary | ICD-10-CM

## 2023-04-19 DIAGNOSIS — M25661 Stiffness of right knee, not elsewhere classified: Secondary | ICD-10-CM

## 2023-04-19 DIAGNOSIS — M6281 Muscle weakness (generalized): Secondary | ICD-10-CM

## 2023-04-19 DIAGNOSIS — R600 Localized edema: Secondary | ICD-10-CM

## 2023-04-19 DIAGNOSIS — G8929 Other chronic pain: Secondary | ICD-10-CM

## 2023-04-19 DIAGNOSIS — M25561 Pain in right knee: Secondary | ICD-10-CM | POA: Diagnosis not present

## 2023-04-19 NOTE — Therapy (Signed)
OUTPATIENT PHYSICAL THERAPY LOWER EXTREMITY TREATMENT   Patient Name: Gloria Mckinney MRN: [id number] DOB:[birth year], 77 y.o., female Today's Date: 04/19/2023  END OF SESSION:  PT End of Session - 04/19/23 1704     Visit Number 4    Date for PT Re-Evaluation 06/22/23    PT Start Time 1704    PT Stop Time 1750    PT Time Calculation (min) 46 min    Activity Tolerance Patient tolerated treatment well    Behavior During Therapy [facility] for tasks assessed/performed                Past Medical History:  Diagnosis Date   Cancer (HCC)    GERD (gastroesophageal reflux disease)    History of radiation therapy 01/16/19- 02/13/19   left breast 15 fractions of 2.67 Gy for a total of 40.05 Gy. Left breast boost 5 fractions of 2 Gy for a total of 10 Gy   Hypertension    Mixed hyperlipidemia    OA (osteoarthritis)    knee right   Peripheral neuropathy    PVD (peripheral vascular disease) (HCC)    Type 2 diabetes mellitus treated with insulin [facility])    endocrinologist--- dr Armen Pickup [place]   Past Surgical History:  Procedure Laterality Date   BREAST BIOPSY Bilateral 2000   benign   BREAST LUMPECTOMY WITH RADIOACTIVE SEED AND SENTINEL LYMPH NODE BIOPSY Left 08/15/2018   Procedure: LEFT BREAST LUMPECTOMY WITH RADIOACTIVE SEED AND LEFT AXILLARY DEEP SENTINEL LYMPH NODE BIOPSY INJECT BLUE DYE LEFT BREAST;  Surgeon: Claud Kelp, MD;  Location: [facility];  Service: General;  Laterality: Left;   LAPAROSCOPIC APPENDECTOMY N/A 12/02/2022   Procedure: APPENDECTOMY LAPAROSCOPIC;  Surgeon: Quentin Ore, MD;  Location: [facility];  Service: General;  Laterality: N/A;   PORT-A-CATH REMOVAL Right 08/09/2019   Procedure: REMOVAL PORT-A-CATH;  Surgeon: Claud Kelp, MD;  Location: [facility];  Service: General;  Laterality: Right;   PORTACATH PLACEMENT Right 08/15/2018   Procedure: INSERTION PORT-A-CATH WITH ULTRASOUND;  Surgeon: Claud Kelp, MD;  Location:  [facility];  Service: General;  Laterality: Right;   SHOULDER ARTHROSCOPY Right 06-16-2002   dr graves   SHOULDER OPEN ROTATOR CUFF REPAIR Right 07-28-2000    dr graves   VAGINAL HYSTERECTOMY  1992   with BSO   WRIST SURGERY  2007   Patient Active Problem List   Diagnosis Date Noted   Acute appendicitis 12/02/2022   Port-A-Cath in place 09/19/2018   Malignant neoplasm of upper-inner quadrant of left breast in female, estrogen receptor positive (HCC) 07/27/2018   Dyspnea on exertion 10/21/2016   Type 2 diabetes mellitus without complication, without long-term current use of insulin (HCC) 10/21/2016   Dyslipidemia 10/21/2016    PCP: Mila Palmer  REFERRING PROVIDER: Trudee Grip  REFERRING DIAG:  [phone] (ICD-10-CM) - Presence of right artificial knee joint    THERAPY DIAG:  Chronic pain of right knee  Stiffness of right knee, not elsewhere classified  Other abnormalities of gait and mobility  Muscle weakness (generalized)  Localized edema  Rationale for Evaluation and Treatment: Rehabilitation  ONSET DATE: 04/07/22  SUBJECTIVE:   SUBJECTIVE STATEMENT: Knee is still hurting. Last week it hurt every day I had to take medicine.   PERTINENT HISTORY: R TKA 04/07/22, Appendectomy 12/02/22  PAIN:  Are you having pain? Yes: NPRS scale: 4/10 Pain location: R knee, behind the knee and below and above Pain description: sharp Aggravating factors: sitting, standing, walking more  than 30 mins Relieving factors: Tylenol   PRECAUTIONS: None  RED FLAGS: None   WEIGHT BEARING RESTRICTIONS: No  FALLS:  Has patient fallen in last 6 months? No  LIVING ENVIRONMENT: Lives with: lives with their family Lives in: House/apartment Stairs: Yes: Internal: 15 steps; on right going up  OCCUPATION: Retired  PLOF: Independent  PATIENT GOALS: no pain   OBJECTIVE:   DIAGNOSTIC FINDINGS: N/A   COGNITION: Overall cognitive status: Within functional limits  for tasks assessed     SENSATION: WFL  EDEMA:  Still has some edema in R knee  MUSCLE LENGTH: Hamstrings: some tightness in BLE  POSTURE: rounded shoulders  PALPATION: No TTP  LOWER EXTREMITY ROM: grossly WFL, R knee pain with hip flexion and end range extension  R knee 105 R knee ext -5d  LOWER EXTREMITY MMT: grossly 5/5. Some weakness in R quad and R hip flexor 4/5   FUNCTIONAL TESTS:  5 times sit to stand: 23.99s  Timed up and go (TUG): 18.41s   GAIT: Distance walked: in clinic distances Assistive device utilized: None Level of assistance: Modified independence Comments: antalgic gait, decreased step length and stance time on RLE    TODAY'S TREATMENT:                                                                                                                              DATE:  04/19/23 NuStep L5 x62mins  Seated knee ext, BLE, 10#, 2 x 10 reps, then eccentric with RLE 5#, x 10 reps, lag noted. Seated knee flexion, BLE 20#, 2 x 10 reps, then RLE only, 10#, x 10 reps Calf stretch 30s Calf raises 2x10 2" lateral heel taps x10 2" anterior heel taps x5 Resisted gait 20# forwards x4  04/14/23 NuStep L5 x 6 minutes Mini squats x 15  B side to side step against G tband x 10 reps each way Attempted step ups, but patient felt popping in her knee, so deferred Seated knee flex and ext against G tband 10 Standing TKE against ball on wall 10  04/05/23 NuStep L5 x 6 minutes Seated knee flexion, BLE 20#, 2 x 10 reps, then RLE only, 10#, 2 x 10 reps Seated knee ext, BLE, 5#, 2 x 10 reps, then eccentric with RLE 5#, x 10 reps, lag noted. Standing side to side step against G tband at the knees x 10 to each side Standing TKE against G tband on R, 2 x 10 reps, VC to maintain her weight on her RLE. Heel raises on step x 10 Heel taps with L heel, 2" step, and return, with BUE support on stair rails, CGA-fearful, but able to complete 10 reps, mildly shaky. Treadmill pushes and  pulls x 10 each, some pain with the push.  EVAL 03/30/23   PATIENT EDUCATION:  Education details: POC and HEP  Person educated: Patient Education method: Explanation Education comprehension: verbalized understanding  HOME EXERCISE PROGRAM: Access Code: [email or web address]  URL: [email or web address] Date: 03/30/2023 Prepared by: Cassie Freer  Exercises - Seated Hamstring Stretch  - 1 x daily - 7 x weekly - 2 reps - 30 hold - Sit to Stand  - 1 x daily - 7 x weekly - 2 sets - 10 reps - Heel Raises with Counter Support  - 1 x daily - 7 x weekly - 2 sets - 10 reps - Standing March with Counter Support  - 1 x daily - 7 x weekly - 2 sets - 10 reps - Standing Hip Abduction with Unilateral Counter Support  - 1 x daily - 7 x weekly - 2 sets - 10 reps  ASSESSMENT:  CLINICAL IMPRESSION: Patient is a 77 y.o. female who was seen today for physical therapy treatment for R knee pain. Her daughter is concerned that she continues to have pain and is not getting better. She continues to walk with an antalgic gait pattern and c/o of pain in her R knee. With isolated knee strengthening, she still has lots of quad weakness. Also has an extensor lag. She has a very hard time doing step downs even on 2" step. Patient has a fear of weight bearing on RLE because it tends to buckle and she is scared to fall. Tried to explain that he quads are still weak and she lacks eccentric control and will need to focus on strengthening. She also has a fear with backwards stepping and walking.  OBJECTIVE IMPAIRMENTS: Abnormal gait, difficulty walking, decreased ROM, decreased strength, increased edema, and pain.   ACTIVITY LIMITATIONS: stairs and locomotion level  PARTICIPATION LIMITATIONS: shopping, community activity, and yard work  [facility] POTENTIAL: Good  CLINICAL DECISION MAKING: Stable/uncomplicated  EVALUATION COMPLEXITY: Low  GOALS: Goals reviewed with patient? Yes  SHORT TERM GOALS: Target date:  05/11/23  Patient will be independent with initial HEP. Goal status: 04/05/23-met  2.  Patient will improve TUG score to <14s  Baseline: 18.41s  Goal status: 04/14/23-ongoing   LONG TERM GOALS: Target date: 06/22/23  Patient will be independent with advanced/ongoing HEP to improve outcomes and carryover.  Goal status: INITIAL  2.  Patient will report at least 75% improvement in R knee pain to improve QOL. Baseline: 4/10 Goal status: 04/05/23-4/10 max- ongoing  3.  Patient will demonstrate improved functional LE strength by 5xSTS <15s. Baseline: 23.99  Goal status: INITIAL  4.  Patient will be able to ambulate 600' with LRAD and normal gait pattern without increased pain to access community.  Baseline: antalgic gait Goal status: INITIAL  5. Patient will be able to increase standing/sitting/walking tolerance more than 30 mins   Baseline: pain after 30 mins  Goal status: INITIAL  PLAN:  PT FREQUENCY: 2x/week  PT DURATION: 12 weeks  PLANNED INTERVENTIONS: Therapeutic exercises, Therapeutic activity, Neuromuscular re-education, Balance training, Gait training, Patient/Family education, Self Care, Joint mobilization, Stair training, Cryotherapy, Moist heat, Taping, Vasopneumatic device, Ionotophoresis 4mg /ml Dexamethasone, and Manual therapy  PLAN FOR NEXT SESSION: R knee strengthening 04/14/23-proprioception   Iona Beard, DPT 04/19/2023, 5:52 PM

## 2023-04-21 ENCOUNTER — Ambulatory Visit: Payer: Medicare Other | Admitting: Physical Therapy

## 2023-04-21 ENCOUNTER — Encounter: Payer: Self-pay | Admitting: Physical Therapy

## 2023-04-21 DIAGNOSIS — M6281 Muscle weakness (generalized): Secondary | ICD-10-CM

## 2023-04-21 DIAGNOSIS — M25561 Pain in right knee: Secondary | ICD-10-CM

## 2023-04-21 DIAGNOSIS — R600 Localized edema: Secondary | ICD-10-CM

## 2023-04-21 DIAGNOSIS — R2689 Other abnormalities of gait and mobility: Secondary | ICD-10-CM

## 2023-04-21 DIAGNOSIS — G8929 Other chronic pain: Secondary | ICD-10-CM

## 2023-04-21 DIAGNOSIS — M25661 Stiffness of right knee, not elsewhere classified: Secondary | ICD-10-CM

## 2023-04-21 NOTE — Therapy (Signed)
OUTPATIENT PHYSICAL THERAPY LOWER EXTREMITY TREATMENT   Patient Name: Anycia Kaliszewski MRN: [id number] DOB:March 21, 1946, 77 y.o., female Today's Date: 04/21/2023  END OF SESSION:  PT End of Session - 04/21/23 1804     Visit Number 5    Date for PT Re-Evaluation 06/22/23    PT Start Time 1800    PT Stop Time 1840    PT Time Calculation (min) 40 min    Activity Tolerance Patient tolerated treatment well    Behavior During Therapy [facility] for tasks assessed/performed            Past Medical History:  Diagnosis Date   Cancer (HCC)    GERD (gastroesophageal reflux disease)    History of radiation therapy 01/16/19- 02/13/19   left breast 15 fractions of 2.67 Gy for a total of 40.05 Gy. Left breast boost 5 fractions of 2 Gy for a total of 10 Gy   Hypertension    Mixed hyperlipidemia    OA (osteoarthritis)    knee right   Peripheral neuropathy    PVD (peripheral vascular disease) (HCC)    Type 2 diabetes mellitus treated with insulin [facility])    endocrinologist--- dr Armen Pickup [place]   Past Surgical History:  Procedure Laterality Date   BREAST BIOPSY Bilateral 2000   benign   BREAST LUMPECTOMY WITH RADIOACTIVE SEED AND SENTINEL LYMPH NODE BIOPSY Left 08/15/2018   Procedure: LEFT BREAST LUMPECTOMY WITH RADIOACTIVE SEED AND LEFT AXILLARY DEEP SENTINEL LYMPH NODE BIOPSY INJECT BLUE DYE LEFT BREAST;  Surgeon: Claud Kelp, MD;  Location: [facility];  Service: General;  Laterality: Left;   LAPAROSCOPIC APPENDECTOMY N/A 12/02/2022   Procedure: APPENDECTOMY LAPAROSCOPIC;  Surgeon: Quentin Ore, MD;  Location: [facility];  Service: General;  Laterality: N/A;   PORT-A-CATH REMOVAL Right 08/09/2019   Procedure: REMOVAL PORT-A-CATH;  Surgeon: Claud Kelp, MD;  Location: [facility];  Service: General;  Laterality: Right;   PORTACATH PLACEMENT Right 08/15/2018   Procedure: INSERTION PORT-A-CATH WITH ULTRASOUND;  Surgeon: Claud Kelp, MD;  Location: [facility];  Service: General;  Laterality: Right;   SHOULDER ARTHROSCOPY Right 06-16-2002   dr graves   SHOULDER OPEN ROTATOR CUFF REPAIR Right 07-28-2000    dr graves   VAGINAL HYSTERECTOMY  1992   with BSO   WRIST SURGERY  2007   Patient Active Problem List   Diagnosis Date Noted   Acute appendicitis 12/02/2022   Port-A-Cath in place 09/19/2018   Malignant neoplasm of upper-inner quadrant of left breast in female, estrogen receptor positive (HCC) 07/27/2018   Dyspnea on exertion 10/21/2016   Type 2 diabetes mellitus without complication, without long-term current use of insulin (HCC) 10/21/2016   Dyslipidemia 10/21/2016    PCP: Mila Palmer  REFERRING PROVIDER: Trudee Grip  REFERRING DIAG:  [phone] (ICD-10-CM) - Presence of right artificial knee joint    THERAPY DIAG:  Chronic pain of right knee  Stiffness of right knee, not elsewhere classified  Other abnormalities of gait and mobility  Acute pain of right knee  Localized edema  Muscle weakness (generalized)  Rationale for Evaluation and Treatment: Rehabilitation  ONSET DATE: 04/07/22  SUBJECTIVE:   SUBJECTIVE STATEMENT: Patient reports no changes.   PERTINENT HISTORY: R TKA 04/07/22, Appendectomy 12/02/22  PAIN:  Are you having pain? Yes: NPRS scale: 4/10 Pain location: R knee, behind the knee and below and above Pain description: sharp Aggravating factors: sitting, standing, walking more than 30 mins Relieving factors: Tylenol   PRECAUTIONS:  None  RED FLAGS: None   WEIGHT BEARING RESTRICTIONS: No  FALLS:  Has patient fallen in last 6 months? No  LIVING ENVIRONMENT: Lives with: lives with their family Lives in: House/apartment Stairs: Yes: Internal: 15 steps; on right going up  OCCUPATION: Retired  PLOF: Independent  PATIENT GOALS: no pain   OBJECTIVE:   DIAGNOSTIC FINDINGS: N/A   COGNITION: Overall cognitive status: Within functional limits for tasks  assessed     SENSATION: WFL  EDEMA:  Still has some edema in R knee  MUSCLE LENGTH: Hamstrings: some tightness in BLE  POSTURE: rounded shoulders  PALPATION: No TTP  LOWER EXTREMITY ROM: grossly WFL, R knee pain with hip flexion and end range extension  R knee 105 R knee ext -5d  LOWER EXTREMITY MMT: grossly 5/5. Some weakness in R quad and R hip flexor 4/5   FUNCTIONAL TESTS:  5 times sit to stand: 23.99s  Timed up and go (TUG): 18.41s   GAIT: Distance walked: in clinic distances Assistive device utilized: None Level of assistance: Modified independence Comments: antalgic gait, decreased step length and stance time on RLE    TODAY'S TREATMENT:                                                                                                                              DATE:  04/21/23 NuStep L5 x 6 minutes Seated B knee flex, 25#, 2 x 10 reps, R LE only 15# x 10 reps Seated knee ext, 10# with BLE 2 x 10 B side stepping on airex beam x 4 each direction, no UE support. Standing TKE against ball on wall, 2 x 10 reps Seated Fitter press for power with 2 blue bands, 2 x 10 reps Monster walks with G tband above knees, HHA for balance, 4 x 5 steps forward Heel raises on step 2 x 10  04/19/23 NuStep L5 x69mins  Seated knee ext, BLE, 10#, 2 x 10 reps, then eccentric with RLE 5#, x 10 reps, lag noted. Seated knee flexion, BLE 20#, 2 x 10 reps, then RLE only, 10#, x 10 reps Calf stretch 30s Calf raises 2x10 2" lateral heel taps x10 2" anterior heel taps x5 Resisted gait 20# forwards x4  04/14/23 NuStep L5 x 6 minutes Mini squats x 15  B side to side step against G tband x 10 reps each way Attempted step ups, but patient felt popping in her knee, so deferred Seated knee flex and ext against G tband 10 Standing TKE against ball on wall 10  04/05/23 NuStep L5 x 6 minutes Seated knee flexion, BLE 20#, 2 x 10 reps, then RLE only, 10#, 2 x 10 reps Seated knee ext, BLE, 5#, 2 x  10 reps, then eccentric with RLE 5#, x 10 reps, lag noted. Standing side to side step against G tband at the knees x 10 to each side Standing TKE against G tband on R, 2 x 10 reps,  VC to maintain her weight on her RLE. Heel raises on step x 10 Heel taps with L heel, 2" step, and return, with BUE support on stair rails, CGA-fearful, but able to complete 10 reps, mildly shaky. Treadmill pushes and pulls x 10 each, some pain with the push.  EVAL 03/30/23   PATIENT EDUCATION:  Education details: POC and HEP  Person educated: Patient Education method: Explanation Education comprehension: verbalized understanding  HOME EXERCISE PROGRAM: Access Code: [email or web address] URL: [email or web address] Date: 03/30/2023 Prepared by: Cassie Freer  Exercises - Seated Hamstring Stretch  - 1 x daily - 7 x weekly - 2 reps - 30 hold - Sit to Stand  - 1 x daily - 7 x weekly - 2 sets - 10 reps - Heel Raises with Counter Support  - 1 x daily - 7 x weekly - 2 sets - 10 reps - Standing March with Counter Support  - 1 x daily - 7 x weekly - 2 sets - 10 reps - Standing Hip Abduction with Unilateral Counter Support  - 1 x daily - 7 x weekly - 2 sets - 10 reps  ASSESSMENT:  CLINICAL IMPRESSION: Patient is a 77 y.o. female who was seen today for physical therapy treatment for R knee pain. She reports continued fear of falling and instability in the knee. Emphasized isolated quad and HS strengthening F/B proprioceptive rehab and then functional strengthening for the leg. Very challenging for her. She tends to lock R knee and then when it releases it has a small give which is frightening to her.  OBJECTIVE IMPAIRMENTS: Abnormal gait, difficulty walking, decreased ROM, decreased strength, increased edema, and pain.   ACTIVITY LIMITATIONS: stairs and locomotion level  PARTICIPATION LIMITATIONS: shopping, community activity, and yard work  [facility] POTENTIAL: Good  CLINICAL DECISION MAKING:  Stable/uncomplicated  EVALUATION COMPLEXITY: Low  GOALS: Goals reviewed with patient? Yes  SHORT TERM GOALS: Target date: 05/11/23  Patient will be independent with initial HEP. Goal status: 04/05/23-met  2.  Patient will improve TUG score to <14s  Baseline: 18.41s  Goal status: 04/14/23-ongoing   LONG TERM GOALS: Target date: 06/22/23  Patient will be independent with advanced/ongoing HEP to improve outcomes and carryover.  Goal status: INITIAL  2.  Patient will report at least 75% improvement in R knee pain to improve QOL. Baseline: 4/10 Goal status: 04/05/23-4/10 max- ongoing  3.  Patient will demonstrate improved functional LE strength by 5xSTS <15s. Baseline: 23.99  Goal status: INITIAL  4.  Patient will be able to ambulate 600' with LRAD and normal gait pattern without increased pain to access community.  Baseline: antalgic gait Goal status: INITIAL  5. Patient will be able to increase standing/sitting/walking tolerance more than 30 mins   Baseline: pain after 30 mins  Goal status: 04/21/23-Limit is 30 minutes, ongoing.  PLAN:  PT FREQUENCY: 2x/week  PT DURATION: 12 weeks  PLANNED INTERVENTIONS: Therapeutic exercises, Therapeutic activity, Neuromuscular re-education, Balance training, Gait training, Patient/Family education, Self Care, Joint mobilization, Stair training, Cryotherapy, Moist heat, Taping, Vasopneumatic device, Ionotophoresis 4mg /ml Dexamethasone, and Manual therapy  PLAN FOR NEXT SESSION: R knee strengthening, try Swedish knee cage to prevent hyperext and practice control in WB.   Iona Beard, DPT 04/21/2023, 6:45 PM

## 2023-04-26 ENCOUNTER — Ambulatory Visit: Payer: Medicare Other | Admitting: Physical Therapy

## 2023-04-26 ENCOUNTER — Encounter: Payer: Self-pay | Admitting: Physical Therapy

## 2023-04-26 DIAGNOSIS — M25561 Pain in right knee: Secondary | ICD-10-CM | POA: Diagnosis not present

## 2023-04-26 DIAGNOSIS — M25661 Stiffness of right knee, not elsewhere classified: Secondary | ICD-10-CM

## 2023-04-26 DIAGNOSIS — R600 Localized edema: Secondary | ICD-10-CM

## 2023-04-26 DIAGNOSIS — G8929 Other chronic pain: Secondary | ICD-10-CM

## 2023-04-26 DIAGNOSIS — R2689 Other abnormalities of gait and mobility: Secondary | ICD-10-CM

## 2023-04-26 DIAGNOSIS — M6281 Muscle weakness (generalized): Secondary | ICD-10-CM

## 2023-04-26 NOTE — Therapy (Signed)
OUTPATIENT PHYSICAL THERAPY LOWER EXTREMITY TREATMENT   Patient Name: Gloria Mckinney MRN: [id number] DOB:[birth year], 77 y.o., female Today's Date: 04/26/2023  END OF SESSION:  PT End of Session - 04/26/23 1746     Visit Number 6    Date for PT Re-Evaluation 06/22/23    Authorization Type Medicare    PT Start Time 1745    PT Stop Time 1830    PT Time Calculation (min) 45 min    Activity Tolerance Patient tolerated treatment well    Behavior During Therapy [facility] for tasks assessed/performed            Past Medical History:  Diagnosis Date   Cancer (HCC)    GERD (gastroesophageal reflux disease)    History of radiation therapy 01/16/19- 02/13/19   left breast 15 fractions of 2.67 Gy for a total of 40.05 Gy. Left breast boost 5 fractions of 2 Gy for a total of 10 Gy   Hypertension    Mixed hyperlipidemia    OA (osteoarthritis)    knee right   Peripheral neuropathy    PVD (peripheral vascular disease) (HCC)    Type 2 diabetes mellitus treated with insulin [facility])    endocrinologist--- dr Armen Pickup [place]   Past Surgical History:  Procedure Laterality Date   BREAST BIOPSY Bilateral 2000   benign   BREAST LUMPECTOMY WITH RADIOACTIVE SEED AND SENTINEL LYMPH NODE BIOPSY Left 08/15/2018   Procedure: LEFT BREAST LUMPECTOMY WITH RADIOACTIVE SEED AND LEFT AXILLARY DEEP SENTINEL LYMPH NODE BIOPSY INJECT BLUE DYE LEFT BREAST;  Surgeon: Claud Kelp, MD;  Location: [facility];  Service: General;  Laterality: Left;   LAPAROSCOPIC APPENDECTOMY N/A 12/02/2022   Procedure: APPENDECTOMY LAPAROSCOPIC;  Surgeon: Quentin Ore, MD;  Location: [facility];  Service: General;  Laterality: N/A;   PORT-A-CATH REMOVAL Right 08/09/2019   Procedure: REMOVAL PORT-A-CATH;  Surgeon: Claud Kelp, MD;  Location: [facility];  Service: General;  Laterality: Right;   PORTACATH PLACEMENT Right 08/15/2018   Procedure: INSERTION PORT-A-CATH WITH ULTRASOUND;  Surgeon: Claud Kelp, MD;  Location: [facility];  Service: General;  Laterality: Right;   SHOULDER ARTHROSCOPY Right 06-16-2002   dr graves   SHOULDER OPEN ROTATOR CUFF REPAIR Right 07-28-2000    dr graves   VAGINAL HYSTERECTOMY  1992   with BSO   WRIST SURGERY  2007   Patient Active Problem List   Diagnosis Date Noted   Acute appendicitis 12/02/2022   Port-A-Cath in place 09/19/2018   Malignant neoplasm of upper-inner quadrant of left breast in female, estrogen receptor positive (HCC) 07/27/2018   Dyspnea on exertion 10/21/2016   Type 2 diabetes mellitus without complication, without long-term current use of insulin (HCC) 10/21/2016   Dyslipidemia 10/21/2016    PCP: Mila Palmer  REFERRING PROVIDER: Trudee Grip  REFERRING DIAG:  [phone] (ICD-10-CM) - Presence of right artificial knee joint    THERAPY DIAG:  Chronic pain of right knee  Stiffness of right knee, not elsewhere classified  Other abnormalities of gait and mobility  Localized edema  Muscle weakness (generalized)  Rationale for Evaluation and Treatment: Rehabilitation  ONSET DATE: 04/07/22  SUBJECTIVE:   SUBJECTIVE STATEMENT: Reports that the knee is like before.  Pain with walking  PERTINENT HISTORY: R TKA 04/07/22, Appendectomy 12/02/22  PAIN:  Are you having pain? Yes: NPRS scale: 4/10 Pain location: R knee, behind the knee and below and above Pain description: sharp Aggravating factors: sitting, standing, walking more than 30 mins  Relieving factors: Tylenol   PRECAUTIONS: None  RED FLAGS: None   WEIGHT BEARING RESTRICTIONS: No  FALLS:  Has patient fallen in last 6 months? No  LIVING ENVIRONMENT: Lives with: lives with their family Lives in: House/apartment Stairs: Yes: Internal: 15 steps; on right going up  OCCUPATION: Retired  PLOF: Independent  PATIENT GOALS: no pain   OBJECTIVE:   DIAGNOSTIC FINDINGS: N/A   COGNITION: Overall cognitive status: Within functional  limits for tasks assessed     SENSATION: WFL  EDEMA:  Still has some edema in R knee  MUSCLE LENGTH: Hamstrings: some tightness in BLE  POSTURE: rounded shoulders  PALPATION: No TTP  LOWER EXTREMITY ROM: grossly WFL, R knee pain with hip flexion and end range extension  R knee 105 R knee ext -5d  LOWER EXTREMITY MMT: grossly 5/5. Some weakness in R quad and R hip flexor 4/5   FUNCTIONAL TESTS:  5 times sit to stand: 23.99s  Timed up and go (TUG): 18.41s   GAIT: Distance walked: in clinic distances Assistive device utilized: None Level of assistance: Modified independence Comments: antalgic gait, decreased step length and stance time on RLE    TODAY'S TREATMENT:                                                                                                                              DATE:  04/26/23 Nustep level 5 x 6 minutes Calf stretches Fitter push 2 blue bands 2x10 Supine feet on ball K2C, bridges, isometric abs Ball b/n knees squeeze 3# SAQ 3x10 Black tband clamshells in hooklying On airex weight shifts Worked on stepping up onto the airex with the right leg, significant knee hyper extension with this Leg press 20# 2x10 with tband behind right knee to help with control, right leg only with no weight again really working on her not snapping the knee back Tried a swedish knee brace that is designed to limit/prevent hyperextension, it was too tight and she did not tolerate it at all to see if it would help  04/21/23 NuStep L5 x 6 minutes Seated B knee flex, 25#, 2 x 10 reps, R LE only 15# x 10 reps Seated knee ext, 10# with BLE 2 x 10 B side stepping on airex beam x 4 each direction, no UE support. Standing TKE against ball on wall, 2 x 10 reps Seated Fitter press for power with 2 blue bands, 2 x 10 reps Monster walks with G tband above knees, HHA for balance, 4 x 5 steps forward Heel raises on step 2 x 10  04/19/23 NuStep L5 x29mins  Seated knee ext, BLE,  10#, 2 x 10 reps, then eccentric with RLE 5#, x 10 reps, lag noted. Seated knee flexion, BLE 20#, 2 x 10 reps, then RLE only, 10#, x 10 reps Calf stretch 30s Calf raises 2x10 2" lateral heel taps x10 2" anterior heel taps x5 Resisted gait 20# forwards x4  04/14/23  NuStep L5 x 6 minutes Mini squats x 15  B side to side step against G tband x 10 reps each way Attempted step ups, but patient felt popping in her knee, so deferred Seated knee flex and ext against G tband 10 Standing TKE against ball on wall 10  04/05/23 NuStep L5 x 6 minutes Seated knee flexion, BLE 20#, 2 x 10 reps, then RLE only, 10#, 2 x 10 reps Seated knee ext, BLE, 5#, 2 x 10 reps, then eccentric with RLE 5#, x 10 reps, lag noted. Standing side to side step against G tband at the knees x 10 to each side Standing TKE against G tband on R, 2 x 10 reps, VC to maintain her weight on her RLE. Heel raises on step x 10 Heel taps with L heel, 2" step, and return, with BUE support on stair rails, CGA-fearful, but able to complete 10 reps, mildly shaky. Treadmill pushes and pulls x 10 each, some pain with the push.  EVAL 03/30/23   PATIENT EDUCATION:  Education details: POC and HEP  Person educated: Patient Education method: Explanation Education comprehension: verbalized understanding  HOME EXERCISE PROGRAM: Access Code: [email or web address] URL: [email or web address] Date: 03/30/2023 Prepared by: Cassie Freer  Exercises - Seated Hamstring Stretch  - 1 x daily - 7 x weekly - 2 reps - 30 hold - Sit to Stand  - 1 x daily - 7 x weekly - 2 sets - 10 reps - Heel Raises with Counter Support  - 1 x daily - 7 x weekly - 2 sets - 10 reps - Standing March with Counter Support  - 1 x daily - 7 x weekly - 2 sets - 10 reps - Standing Hip Abduction with Unilateral Counter Support  - 1 x daily - 7 x weekly - 2 sets - 10 reps  ASSESSMENT:  CLINICAL IMPRESSION: Patient is a 77 y.o. female who was seen today for physical therapy  treatment for R knee pain.I tried a brace to limit hyperextension but it was too tight and she would not tolerate trying it, she really feels like the knee is loose and we talked about using mms and learning how to use the mms to prevent the snapping back of the knee and prevent the hyperextension, I feel like she was doing this with the fitter and leg press but when she goes to walk she definitely goes to the snapping it to extension and could not step up n the airex with the right leg first without this happening  OBJECTIVE IMPAIRMENTS: Abnormal gait, difficulty walking, decreased ROM, decreased strength, increased edema, and pain.   ACTIVITY LIMITATIONS: stairs and locomotion level  PARTICIPATION LIMITATIONS: shopping, community activity, and yard work  [facility] POTENTIAL: Good  CLINICAL DECISION MAKING: Stable/uncomplicated  EVALUATION COMPLEXITY: Low  GOALS: Goals reviewed with patient? Yes  SHORT TERM GOALS: Target date: 05/11/23  Patient will be independent with initial HEP. Goal status: 04/05/23-met  2.  Patient will improve TUG score to <14s  Baseline: 18.41s  Goal status: 04/14/23-ongoing   LONG TERM GOALS: Target date: 06/22/23  Patient will be independent with advanced/ongoing HEP to improve outcomes and carryover.  Goal status: INITIAL  2.  Patient will report at least 75% improvement in R knee pain to improve QOL. Baseline: 4/10 Goal status: 04/05/23-4/10 max- ongoing  3.  Patient will demonstrate improved functional LE strength by 5xSTS <15s. Baseline: 23.99  Goal status: INITIAL  4.  Patient will be able to ambulate 600' with  LRAD and normal gait pattern without increased pain to access community.  Baseline: antalgic gait Goal status: INITIAL  5. Patient will be able to increase standing/sitting/walking tolerance more than 30 mins   Baseline: pain after 30 mins  Goal status: 04/21/23-Limit is 30 minutes, ongoing.  PLAN:  PT FREQUENCY: 2x/week  PT DURATION:  12 weeks  PLANNED INTERVENTIONS: Therapeutic exercises, Therapeutic activity, Neuromuscular re-education, Balance training, Gait training, Patient/Family education, Self Care, Joint mobilization, Stair training, Cryotherapy, Moist heat, Taping, Vasopneumatic device, Ionotophoresis 4mg /ml Dexamethasone, and Manual therapy  PLAN FOR NEXT SESSION: R knee strengthening, try Swedish knee cage to prevent hyperext and practice control in WB.  Stacie Glaze, PT 04/26/2023, 5:46 PM

## 2023-04-28 ENCOUNTER — Encounter: Payer: Self-pay | Admitting: Physical Therapy

## 2023-04-28 ENCOUNTER — Ambulatory Visit: Payer: Medicare Other | Admitting: Physical Therapy

## 2023-04-28 DIAGNOSIS — M6281 Muscle weakness (generalized): Secondary | ICD-10-CM

## 2023-04-28 DIAGNOSIS — M25561 Pain in right knee: Secondary | ICD-10-CM | POA: Diagnosis not present

## 2023-04-28 DIAGNOSIS — R2689 Other abnormalities of gait and mobility: Secondary | ICD-10-CM

## 2023-04-28 DIAGNOSIS — M25661 Stiffness of right knee, not elsewhere classified: Secondary | ICD-10-CM

## 2023-04-28 DIAGNOSIS — G8929 Other chronic pain: Secondary | ICD-10-CM

## 2023-04-28 DIAGNOSIS — R600 Localized edema: Secondary | ICD-10-CM

## 2023-04-28 NOTE — Therapy (Signed)
OUTPATIENT PHYSICAL THERAPY LOWER EXTREMITY TREATMENT   Patient Name: Gloria Mckinney MRN: [id number] DOB:April 29, 1946, 77 y.o., female Today's Date: 04/28/2023  END OF SESSION:  PT End of Session - 04/28/23 1748     Visit Number 7    Date for PT Re-Evaluation 06/22/23    Authorization Type Medicare    PT Start Time 1744    PT Stop Time 1830    PT Time Calculation (min) 46 min    Activity Tolerance Patient tolerated treatment well    Behavior During Therapy [facility] for tasks assessed/performed            Past Medical History:  Diagnosis Date   Cancer (HCC)    GERD (gastroesophageal reflux disease)    History of radiation therapy 01/16/19- 02/13/19   left breast 15 fractions of 2.67 Gy for a total of 40.05 Gy. Left breast boost 5 fractions of 2 Gy for a total of 10 Gy   Hypertension    Mixed hyperlipidemia    OA (osteoarthritis)    knee right   Peripheral neuropathy    PVD (peripheral vascular disease) (HCC)    Type 2 diabetes mellitus treated with insulin [facility])    endocrinologist--- dr Armen Pickup Math[place]   Past Surgical History:  Procedure Laterality Date   BREAST BIOPSY Bilateral 2000   benign   BREAST LUMPECTOMY WITH RADIOACTIVE SEED AND SENTINEL LYMPH NODE BIOPSY Left 08/15/2018   Procedure: LEFT BREAST LUMPECTOMY WITH RADIOACTIVE SEED AND LEFT AXILLARY DEEP SENTINEL LYMPH NODE BIOPSY INJECT BLUE DYE LEFT BREAST;  Surgeon: Claud Kelp, MD;  Location: [facility];  Service: General;  Laterality: Left;   LAPAROSCOPIC APPENDECTOMY N/A 12/02/2022   Procedure: APPENDECTOMY LAPAROSCOPIC;  Surgeon: Quentin Ore, MD;  Location: [facility] OR;  Service: General;  Laterality: N/A;   PORT-A-CATH REMOVAL Right 08/09/2019   Procedure: REMOVAL PORT-A-CATH;  Surgeon: Claud Kelp, MD;  Location: [facility];  Service: General;  Laterality: Right;   PORTACATH PLACEMENT Right 08/15/2018   Procedure: INSERTION PORT-A-CATH WITH ULTRASOUND;  Surgeon: Claud Kelp, MD;  Location: [facility];  Service: General;  Laterality: Right;   SHOULDER ARTHROSCOPY Right 06-16-2002   dr graves   SHOULDER OPEN ROTATOR CUFF REPAIR Right 07-28-2000    dr graves   VAGINAL HYSTERECTOMY  1992   with BSO   WRIST SURGERY  2007   Patient Active Problem List   Diagnosis Date Noted   Acute appendicitis 12/02/2022   Port-A-Cath in place 09/19/2018   Malignant neoplasm of upper-inner quadrant of left breast in female, estrogen receptor positive (HCC) 07/27/2018   Dyspnea on exertion 10/21/2016   Type 2 diabetes mellitus without complication, without long-term current use of insulin (HCC) 10/21/2016   Dyslipidemia 10/21/2016    PCP: Mila Palmer  REFERRING PROVIDER: Trudee Grip  REFERRING DIAG:  [phone] (ICD-10-CM) - Presence of right artificial knee joint    THERAPY DIAG:  Chronic pain of right knee  Stiffness of right knee, not elsewhere classified  Other abnormalities of gait and mobility  Localized edema  Muscle weakness (generalized)  Rationale for Evaluation and Treatment: Rehabilitation  ONSET DATE: 04/07/22  SUBJECTIVE:   SUBJECTIVE STATEMENT: Still pain, "don't think this is helping"  PERTINENT HISTORY: R TKA 04/07/22, Appendectomy 12/02/22  PAIN:  Are you having pain? Yes: NPRS scale: 4/10 Pain location: R knee, behind the knee and below and above Pain description: sharp Aggravating factors: sitting, standing, walking more than 30 mins Relieving factors: Tylenol  PRECAUTIONS: None  RED FLAGS: None   WEIGHT BEARING RESTRICTIONS: No  FALLS:  Has patient fallen in last 6 months? No  LIVING ENVIRONMENT: Lives with: lives with their family Lives in: House/apartment Stairs: Yes: Internal: 15 steps; on right going up  OCCUPATION: Retired  PLOF: Independent  PATIENT GOALS: no pain   OBJECTIVE:   DIAGNOSTIC FINDINGS: N/A   COGNITION: Overall cognitive status: Within functional limits for tasks  assessed     SENSATION: WFL  EDEMA:  Still has some edema in R knee  MUSCLE LENGTH: Hamstrings: some tightness in BLE  POSTURE: rounded shoulders  PALPATION: No TTP  LOWER EXTREMITY ROM: grossly WFL, R knee pain with hip flexion and end range extension  R knee 105 R knee ext -5d  LOWER EXTREMITY MMT: grossly 5/5. Some weakness in R quad and R hip flexor 4/5   FUNCTIONAL TESTS:  5 times sit to stand: 23.99s  Timed up and go (TUG): 18.41s   GAIT: Distance walked: in clinic distances Assistive device utilized: None Level of assistance: Modified independence Comments: antalgic gait, decreased step length and stance time on RLE    TODAY'S TREATMENT:                                                                                                                              DATE:  04/28/23 Nustep level 5 x 6 minutes 20# leg curls 3x10 3# SAQ and LAQ with cues for TKE and pause Ball b/n knees squeeze Tried the walking with the swedish knee cage able to get a little better fit but she truly would need an extra large, and not sure that it helped at this time, I had a long talk with her and her daughter regarding options  04/26/23 Nustep level 5 x 6 minutes Calf stretches Fitter push 2 blue bands 2x10 Supine feet on ball K2C, bridges, isometric abs Ball b/n knees squeeze 3# SAQ 3x10 Black tband clamshells in hooklying On airex weight shifts Worked on stepping up onto the airex with the right leg, significant knee hyper extension with this Leg press 20# 2x10 with tband behind right knee to help with control, right leg only with no weight again really working on her not snapping the knee back Tried a swedish knee brace that is designed to limit/prevent hyperextension, it was too tight and she did not tolerate it at all to see if it would help  04/21/23 NuStep L5 x 6 minutes Seated B knee flex, 25#, 2 x 10 reps, R LE only 15# x 10 reps Seated knee ext, 10# with BLE 2 x 10 B  side stepping on airex beam x 4 each direction, no UE support. Standing TKE against ball on wall, 2 x 10 reps Seated Fitter press for power with 2 blue bands, 2 x 10 reps Monster walks with G tband above knees, HHA for balance, 4 x 5 steps forward Heel raises on step 2 x  10  04/19/23 NuStep L5 x20mins  Seated knee ext, BLE, 10#, 2 x 10 reps, then eccentric with RLE 5#, x 10 reps, lag noted. Seated knee flexion, BLE 20#, 2 x 10 reps, then RLE only, 10#, x 10 reps Calf stretch 30s Calf raises 2x10 2" lateral heel taps x10 2" anterior heel taps x5 Resisted gait 20# forwards x4  04/14/23 NuStep L5 x 6 minutes Mini squats x 15  B side to side step against G tband x 10 reps each way Attempted step ups, but patient felt popping in her knee, so deferred Seated knee flex and ext against G tband 10 Standing TKE against ball on wall 10  04/05/23 NuStep L5 x 6 minutes Seated knee flexion, BLE 20#, 2 x 10 reps, then RLE only, 10#, 2 x 10 reps Seated knee ext, BLE, 5#, 2 x 10 reps, then eccentric with RLE 5#, x 10 reps, lag noted. Standing side to side step against G tband at the knees x 10 to each side Standing TKE against G tband on R, 2 x 10 reps, VC to maintain her weight on her RLE. Heel raises on step x 10 Heel taps with L heel, 2" step, and return, with BUE support on stair rails, CGA-fearful, but able to complete 10 reps, mildly shaky. Treadmill pushes and pulls x 10 each, some pain with the push.  EVAL 03/30/23   PATIENT EDUCATION:  Education details: POC and HEP  Person educated: Patient Education method: Explanation Education comprehension: verbalized understanding  HOME EXERCISE PROGRAM: Access Code: [email or web address] URL: [email or web address] Date: 03/30/2023 Prepared by: Cassie Freer  Exercises - Seated Hamstring Stretch  - 1 x daily - 7 x weekly - 2 reps - 30 hold - Sit to Stand  - 1 x daily - 7 x weekly - 2 sets - 10 reps - Heel Raises with Counter Support  - 1 x  daily - 7 x weekly - 2 sets - 10 reps - Standing March with Counter Support  - 1 x daily - 7 x weekly - 2 sets - 10 reps - Standing Hip Abduction with Unilateral Counter Support  - 1 x daily - 7 x weekly - 2 sets - 10 reps  ASSESSMENT:  CLINICAL IMPRESSION: Patient is a 77 y.o. female who was seen today for physical therapy treatment for R knee pain.I tried a brace again to see if I could get a better fit, not sure I achieved this and it did not appear to help, I talked with patient and her daughter about options as the patient would like to have a surgery to fix it, she did get a second opinion but was told this was normal and it takes time, I saw her after the surgery and do think she was doing some better at that time.  With her confidence and speed of gait.  We talked about the options of braces that they have at [facility] to see if a brace would help and to not go to the extent of a surgery.  They are going to try a brace and see what happens.  OBJECTIVE IMPAIRMENTS: Abnormal gait, difficulty walking, decreased ROM, decreased strength, increased edema, and pain.   ACTIVITY LIMITATIONS: stairs and locomotion level  PARTICIPATION LIMITATIONS: shopping, community activity, and yard work  [facility] POTENTIAL: Good  CLINICAL DECISION MAKING: Stable/uncomplicated  EVALUATION COMPLEXITY: Low  GOALS: Goals reviewed with patient? Yes  SHORT TERM GOALS: Target date: 05/11/23  Patient will be independent with initial HEP.  Goal status: 04/05/23-met  2.  Patient will improve TUG score to <14s  Baseline: 18.41s  Goal status: 04/14/23-ongoing   LONG TERM GOALS: Target date: 06/22/23  Patient will be independent with advanced/ongoing HEP to improve outcomes and carryover.  Goal status: INITIAL  2.  Patient will report at least 75% improvement in R knee pain to improve QOL. Baseline: 4/10 Goal status: 04/05/23-4/10 max- ongoing  3.  Patient will demonstrate improved functional LE strength by  5xSTS <15s. Baseline: 23.99  Goal status: INITIAL  4.  Patient will be able to ambulate 600' with LRAD and normal gait pattern without increased pain to access community.  Baseline: antalgic gait Goal status: INITIAL  5. Patient will be able to increase standing/sitting/walking tolerance more than 30 mins   Baseline: pain after 30 mins  Goal status: 04/21/23-Limit is 30 minutes, ongoing.  PLAN:  PT FREQUENCY: 2x/week  PT DURATION: 12 weeks  PLANNED INTERVENTIONS: Therapeutic exercises, Therapeutic activity, Neuromuscular re-education, Balance training, Gait training, Patient/Family education, Self Care, Joint mobilization, Stair training, Cryotherapy, Moist heat, Taping, Vasopneumatic device, Ionotophoresis 4mg /ml Dexamethasone, and Manual therapy  PLAN FOR NEXT SESSION: no positive changes, the patient reports that she wants the knee fixed by surgery, we talked about less invasive options of strengthening the knee and a brace.  They will try a brace  Stacie Glaze, PT 04/28/2023, 5:49 PM

## 2023-05-03 ENCOUNTER — Ambulatory Visit: Payer: Medicare Other | Admitting: Physical Therapy

## 2023-05-05 ENCOUNTER — Ambulatory Visit: Payer: Medicare Other | Admitting: Physical Therapy

## 2023-06-24 ENCOUNTER — Encounter: Admit: 2023-06-24 | Payer: PRIVATE HEALTH INSURANCE | Attending: Internal Medicine | Primary: Internal Medicine

## 2023-06-24 ENCOUNTER — Ambulatory Visit: Admit: 2023-06-24 | Payer: MEDICARE | Attending: Internal Medicine | Primary: Internal Medicine

## 2023-06-24 DIAGNOSIS — M7989 Other specified soft tissue disorders: Secondary | ICD-10-CM

## 2023-06-25 ENCOUNTER — Inpatient Hospital Stay: Admit: 2023-06-25 | Discharge: 2023-06-25 | Payer: MEDICARE | Primary: Internal Medicine

## 2023-06-25 DIAGNOSIS — I10 Essential (primary) hypertension: Secondary | ICD-10-CM

## 2023-06-25 DIAGNOSIS — E1169 Type 2 diabetes mellitus with other specified complication: Principal | ICD-10-CM

## 2023-06-25 DIAGNOSIS — E785 Hyperlipidemia, unspecified: Secondary | ICD-10-CM

## 2023-06-25 LAB — COMPREHENSIVE METABOLIC PANEL
BKR A/G RATIO: 1.6 (ref 1.0–2.2)
BKR ALANINE AMINOTRANSFERASE (ALT): 23 U/L (ref 10–35)
BKR ALBUMIN: 4.3 g/dL (ref 3.6–5.1)
BKR ALKALINE PHOSPHATASE: 62 U/L (ref 9–122)
BKR ANION GAP: 11 (ref 7–17)
BKR ASPARTATE AMINOTRANSFERASE (AST): 25 U/L (ref 10–35)
BKR AST/ALT RATIO: 1.1
BKR BILIRUBIN TOTAL: 0.5 mg/dL (ref <=1.2)
BKR BLOOD UREA NITROGEN: 20 mg/dL (ref 8–23)
BKR BUN / CREAT RATIO: 17.5 (ref 8.0–23.0)
BKR CALCIUM: 9.2 mg/dL (ref 8.8–10.2)
BKR CHLORIDE: 100 mmol/L (ref 98–107)
BKR CO2: 24 mmol/L (ref 20–30)
BKR CREATININE: 1.14 mg/dL (ref 0.40–1.30)
BKR EGFR, CREATININE (CKD-EPI 2021): 50 mL/min/{1.73_m2} — ABNORMAL LOW (ref >=60)
BKR GLOBULIN: 2.7 g/dL (ref 2.0–3.9)
BKR GLUCOSE: 142 mg/dL — ABNORMAL HIGH (ref 70–100)
BKR POTASSIUM: 4.8 mmol/L (ref 3.3–5.3)
BKR PROTEIN TOTAL: 7 g/dL (ref 5.9–8.3)
BKR SODIUM: 135 mmol/L — ABNORMAL LOW (ref 136–144)

## 2023-06-25 LAB — CBC WITH AUTO DIFFERENTIAL
BKR WAM ABSOLUTE IMMATURE GRANULOCYTES.: 0.08 x 1000/ÂµL (ref 0.00–0.30)
BKR WAM ABSOLUTE LYMPHOCYTE COUNT.: 1.37 x 1000/ÂµL (ref 0.60–3.70)
BKR WAM ABSOLUTE NRBC (2 DEC): 0 x 1000/ÂµL (ref 0.00–1.00)
BKR WAM ANC (ABSOLUTE NEUTROPHIL COUNT): 3.51 x 1000/ÂµL (ref 2.00–7.60)
BKR WAM BASOPHIL ABSOLUTE COUNT.: 0.03 x 1000/ÂµL (ref 0.00–1.00)
BKR WAM BASOPHILS: 0.5 % (ref 0.0–1.4)
BKR WAM EOSINOPHIL ABSOLUTE COUNT.: 0.19 x 1000/ÂµL (ref 0.00–1.00)
BKR WAM EOSINOPHILS: 3.3 % (ref 0.0–5.0)
BKR WAM HEMATOCRIT (2 DEC): 32.7 % — ABNORMAL LOW (ref 35.00–45.00)
BKR WAM HEMOGLOBIN: 10.6 g/dL — ABNORMAL LOW (ref 11.7–15.5)
BKR WAM IMMATURE GRANULOCYTES: 1.4 % — ABNORMAL HIGH (ref 0.0–1.0)
BKR WAM LYMPHOCYTES: 24 % (ref 17.0–50.0)
BKR WAM MCH (PG): 30.3 pg (ref 27.0–33.0)
BKR WAM MCHC: 32.4 g/dL (ref 31.0–36.0)
BKR WAM MCV: 93.4 fL (ref 80.0–100.0)
BKR WAM MONOCYTE ABSOLUTE COUNT.: 0.54 x 1000/ÂµL (ref 0.00–1.00)
BKR WAM MONOCYTES: 9.4 % (ref 4.0–12.0)
BKR WAM MPV: 11.2 fL (ref 8.0–12.0)
BKR WAM NEUTROPHILS: 61.4 % (ref 39.0–72.0)
BKR WAM NUCLEATED RED BLOOD CELLS: 0 % (ref 0.0–1.0)
BKR WAM PLATELETS: 280 x1000/ÂµL (ref 150–420)
BKR WAM RDW-CV: 13.2 % (ref 11.0–15.0)
BKR WAM RED BLOOD CELL COUNT.: 3.5 M/ÂµL — ABNORMAL LOW (ref 4.00–6.00)
BKR WAM WHITE BLOOD CELL COUNT: 5.7 x1000/ÂµL (ref 4.0–11.0)

## 2023-06-25 LAB — URINALYSIS-MACROSCOPIC W/REFLEX MICROSCOPIC
BKR BILIRUBIN, UA: NEGATIVE
BKR BLOOD, UA: NEGATIVE
BKR GLUCOSE, UA: NEGATIVE
BKR KETONES, UA: NEGATIVE
BKR NITRITE, UA: NEGATIVE
BKR PH, UA: 7 (ref 5.5–7.5)
BKR PROTEIN, UA: NEGATIVE
BKR SPECIFIC GRAVITY, UA: 1.013 (ref 1.005–1.030)
BKR UROBILINOGEN, UA (MG/DL): <2 mg/dL (ref <=2.0)

## 2023-06-25 LAB — LIPID PANEL
BKR CHOLESTEROL/HDL RATIO: 3.5 (ref 0.0–5.0)
BKR CHOLESTEROL: 138 mg/dL
BKR HDL CHOLESTEROL: 39 mg/dL — ABNORMAL LOW (ref >=40)
BKR LDL CHOLESTEROL SAMPSON CALCULATED: 67 mg/dL
BKR TRIGLYCERIDES: 195 mg/dL — ABNORMAL HIGH

## 2023-06-25 LAB — ALBUMIN/CREATININE PANEL, URINE, RANDOM
BKR ALBUMIN, URINE, RANDOM: 15.9 mg/L
BKR ALBUMIN/CREATININE RATIO, URINE, RANDOM: 19.2 mg/g{creat} (ref <30.0)
BKR CREATININE, URINE, RANDOM: 83 mg/dL

## 2023-06-25 LAB — URINE MICROSCOPIC     (BH GH LMW YH)
BKR RBC/HPF INSTRUMENT: <1 /HPF (ref 0–2)
BKR URINE SQUAMOUS EPITHELIAL CELLS, UA (NUMERIC): 1 /HPF (ref 0–5)
BKR WBC/HPF INSTRUMENT: 1 /HPF (ref 0–5)

## 2023-06-25 LAB — VITAMIN B12: BKR VITAMIN B12: 265 pg/mL (ref 232–1245)

## 2023-06-26 LAB — HEMOGLOBIN A1C
BKR ESTIMATED AVERAGE GLUCOSE: 174 mg/dL
BKR HEMOGLOBIN A1C: 7.7 % — ABNORMAL HIGH (ref 4.0–5.6)

## 2023-06-29 ENCOUNTER — Encounter: Admit: 2023-06-29 | Payer: PRIVATE HEALTH INSURANCE | Attending: Internal Medicine | Primary: Internal Medicine

## 2023-06-29 MED ORDER — SITAGLIPTIN PHOSPHATE 100 MG TABLET
100 | ORAL_TABLET | Freq: Every day | ORAL | 4 refills | 90.00000 days | Status: AC
Start: 2023-06-29 — End: 2023-07-21

## 2023-06-29 NOTE — Other
 Please notify patient A1c has climbed to 7.7.  Recommend adding Januvia 100 mg 1 p.o. daily.  Continue metformin twice daily.

## 2023-06-30 NOTE — Other
LMTCO.

## 2023-07-05 NOTE — Other
LMTCO.

## 2023-07-16 ENCOUNTER — Telehealth: Admit: 2023-07-16 | Payer: PRIVATE HEALTH INSURANCE | Attending: Internal Medicine | Primary: Internal Medicine

## 2023-07-21 ENCOUNTER — Encounter: Admit: 2023-07-21 | Payer: PRIVATE HEALTH INSURANCE | Attending: Internal Medicine | Primary: Internal Medicine

## 2023-07-21 MED ORDER — LINAGLIPTIN 5 MG TABLET
5 | ORAL_TABLET | Freq: Every day | ORAL | 4 refills | 90.00000 days | Status: AC
Start: 2023-07-21 — End: ?

## 2023-08-26 ENCOUNTER — Other Ambulatory Visit: Payer: Self-pay | Admitting: Family Medicine

## 2023-08-26 DIAGNOSIS — Z1231 Encounter for screening mammogram for malignant neoplasm of breast: Secondary | ICD-10-CM

## 2023-09-24 ENCOUNTER — Other Ambulatory Visit: Payer: Self-pay

## 2023-09-24 ENCOUNTER — Encounter (HOSPITAL_COMMUNITY): Payer: Self-pay | Admitting: Emergency Medicine

## 2023-09-24 ENCOUNTER — Emergency Department (HOSPITAL_COMMUNITY): Payer: Medicare Other

## 2023-09-24 ENCOUNTER — Inpatient Hospital Stay (HOSPITAL_COMMUNITY)
Admission: EM | Admit: 2023-09-24 | Discharge: 2023-09-27 | DRG: 418 | Disposition: A | Discharge status: Home / Self Care | Payer: Medicare Other | Attending: Internal Medicine | Admitting: Internal Medicine

## 2023-09-24 DIAGNOSIS — Z9221 Personal history of antineoplastic chemotherapy: Secondary | ICD-10-CM

## 2023-09-24 DIAGNOSIS — Z79899 Other long term (current) drug therapy: Secondary | ICD-10-CM

## 2023-09-24 DIAGNOSIS — E861 Hypovolemia: Secondary | ICD-10-CM | POA: Diagnosis present

## 2023-09-24 DIAGNOSIS — M3500 Sicca syndrome, unspecified: Secondary | ICD-10-CM | POA: Diagnosis present

## 2023-09-24 DIAGNOSIS — D72829 Elevated white blood cell count, unspecified: Secondary | ICD-10-CM

## 2023-09-24 DIAGNOSIS — Z923 Personal history of irradiation: Secondary | ICD-10-CM

## 2023-09-24 DIAGNOSIS — M1711 Unilateral primary osteoarthritis, right knee: Secondary | ICD-10-CM | POA: Diagnosis present

## 2023-09-24 DIAGNOSIS — E871 Hypo-osmolality and hyponatremia: Secondary | ICD-10-CM | POA: Diagnosis present

## 2023-09-24 DIAGNOSIS — E782 Mixed hyperlipidemia: Secondary | ICD-10-CM | POA: Diagnosis present

## 2023-09-24 DIAGNOSIS — K810 Acute cholecystitis: Principal | ICD-10-CM | POA: Diagnosis present

## 2023-09-24 DIAGNOSIS — S20111A Abrasion of breast, right breast, initial encounter: Secondary | ICD-10-CM

## 2023-09-24 DIAGNOSIS — Z90710 Acquired absence of both cervix and uterus: Secondary | ICD-10-CM

## 2023-09-24 DIAGNOSIS — K219 Gastro-esophageal reflux disease without esophagitis: Secondary | ICD-10-CM | POA: Diagnosis present

## 2023-09-24 DIAGNOSIS — R682 Dry mouth, unspecified: Secondary | ICD-10-CM

## 2023-09-24 DIAGNOSIS — Z90722 Acquired absence of ovaries, bilateral: Secondary | ICD-10-CM

## 2023-09-24 DIAGNOSIS — C50212 Malignant neoplasm of upper-inner quadrant of left female breast: Secondary | ICD-10-CM | POA: Diagnosis present

## 2023-09-24 DIAGNOSIS — E669 Obesity, unspecified: Secondary | ICD-10-CM | POA: Diagnosis present

## 2023-09-24 DIAGNOSIS — Z880 Allergy status to penicillin: Secondary | ICD-10-CM

## 2023-09-24 DIAGNOSIS — Z6831 Body mass index (BMI) 31.0-31.9, adult: Secondary | ICD-10-CM

## 2023-09-24 DIAGNOSIS — R809 Proteinuria, unspecified: Secondary | ICD-10-CM | POA: Diagnosis present

## 2023-09-24 DIAGNOSIS — Z91040 Latex allergy status: Secondary | ICD-10-CM

## 2023-09-24 DIAGNOSIS — N182 Chronic kidney disease, stage 2 (mild): Secondary | ICD-10-CM | POA: Diagnosis present

## 2023-09-24 DIAGNOSIS — Z170 Estrogen receptor positive status [ER+]: Secondary | ICD-10-CM

## 2023-09-24 DIAGNOSIS — K5900 Constipation, unspecified: Secondary | ICD-10-CM | POA: Diagnosis present

## 2023-09-24 DIAGNOSIS — Z603 Acculturation difficulty: Secondary | ICD-10-CM | POA: Diagnosis present

## 2023-09-24 DIAGNOSIS — Z7984 Long term (current) use of oral hypoglycemic drugs: Secondary | ICD-10-CM

## 2023-09-24 DIAGNOSIS — Z8249 Family history of ischemic heart disease and other diseases of the circulatory system: Secondary | ICD-10-CM

## 2023-09-24 DIAGNOSIS — I160 Hypertensive urgency: Secondary | ICD-10-CM | POA: Diagnosis present

## 2023-09-24 DIAGNOSIS — T383X6A Underdosing of insulin and oral hypoglycemic [antidiabetic] drugs, initial encounter: Secondary | ICD-10-CM | POA: Diagnosis present

## 2023-09-24 DIAGNOSIS — Z7982 Long term (current) use of aspirin: Secondary | ICD-10-CM

## 2023-09-24 DIAGNOSIS — Z825 Family history of asthma and other chronic lower respiratory diseases: Secondary | ICD-10-CM

## 2023-09-24 DIAGNOSIS — Z91128 Patient's intentional underdosing of medication regimen for other reason: Secondary | ICD-10-CM

## 2023-09-24 DIAGNOSIS — R7989 Other specified abnormal findings of blood chemistry: Secondary | ICD-10-CM | POA: Diagnosis present

## 2023-09-24 DIAGNOSIS — E1122 Type 2 diabetes mellitus with diabetic chronic kidney disease: Secondary | ICD-10-CM | POA: Diagnosis present

## 2023-09-24 DIAGNOSIS — E860 Dehydration: Secondary | ICD-10-CM | POA: Diagnosis present

## 2023-09-24 DIAGNOSIS — E119 Type 2 diabetes mellitus without complications: Secondary | ICD-10-CM

## 2023-09-24 DIAGNOSIS — E1142 Type 2 diabetes mellitus with diabetic polyneuropathy: Secondary | ICD-10-CM | POA: Diagnosis present

## 2023-09-24 DIAGNOSIS — Z881 Allergy status to other antibiotic agents status: Secondary | ICD-10-CM

## 2023-09-24 DIAGNOSIS — R1011 Right upper quadrant pain: Secondary | ICD-10-CM | POA: Diagnosis not present

## 2023-09-24 DIAGNOSIS — Z79811 Long term (current) use of aromatase inhibitors: Secondary | ICD-10-CM

## 2023-09-24 DIAGNOSIS — M109 Gout, unspecified: Secondary | ICD-10-CM | POA: Diagnosis present

## 2023-09-24 DIAGNOSIS — R109 Unspecified abdominal pain: Secondary | ICD-10-CM | POA: Diagnosis present

## 2023-09-24 DIAGNOSIS — I129 Hypertensive chronic kidney disease with stage 1 through stage 4 chronic kidney disease, or unspecified chronic kidney disease: Secondary | ICD-10-CM | POA: Diagnosis present

## 2023-09-24 DIAGNOSIS — Z833 Family history of diabetes mellitus: Secondary | ICD-10-CM

## 2023-09-24 DIAGNOSIS — Z823 Family history of stroke: Secondary | ICD-10-CM

## 2023-09-24 DIAGNOSIS — R1010 Upper abdominal pain, unspecified: Secondary | ICD-10-CM

## 2023-09-24 DIAGNOSIS — Z888 Allergy status to other drugs, medicaments and biological substances status: Secondary | ICD-10-CM

## 2023-09-24 DIAGNOSIS — E1151 Type 2 diabetes mellitus with diabetic peripheral angiopathy without gangrene: Secondary | ICD-10-CM | POA: Diagnosis present

## 2023-09-24 DIAGNOSIS — E785 Hyperlipidemia, unspecified: Secondary | ICD-10-CM | POA: Diagnosis present

## 2023-09-24 DIAGNOSIS — Z9079 Acquired absence of other genital organ(s): Secondary | ICD-10-CM

## 2023-09-24 LAB — I-STAT CHEM 8, ED
BUN: 19 mg/dL (ref 8–23)
Calcium, Ion: 1.13 mmol/L — ABNORMAL LOW (ref 1.15–1.40)
Chloride: 91 mmol/L — ABNORMAL LOW (ref 98–111)
Creatinine, Ser: 1.1 mg/dL — ABNORMAL HIGH (ref 0.44–1.00)
Glucose, Bld: 216 mg/dL — ABNORMAL HIGH (ref 70–99)
HCT: 40 % (ref 36.0–46.0)
Hemoglobin: 13.6 g/dL (ref 12.0–15.0)
Potassium: 4 mmol/L (ref 3.5–5.1)
Sodium: 128 mmol/L — ABNORMAL LOW (ref 135–145)
TCO2: 25 mmol/L (ref 22–32)

## 2023-09-24 LAB — TROPONIN I (HIGH SENSITIVITY)
Troponin I (High Sensitivity): 23 ng/L — ABNORMAL HIGH (ref <18)
Troponin I (High Sensitivity): 22 ng/L — ABNORMAL HIGH (ref <18)

## 2023-09-24 LAB — COMPREHENSIVE METABOLIC PANEL
ALT: 15 U/L (ref 0–44)
AST: 18 U/L (ref 15–41)
Albumin: 3.7 g/dL (ref 3.5–5.0)
Alkaline Phosphatase: 67 U/L (ref 38–126)
Anion gap: 14 (ref 5–15)
BUN: 17 mg/dL (ref 8–23)
CO2: 23 mmol/L (ref 22–32)
Calcium: 9.6 mg/dL (ref 8.9–10.3)
Chloride: 90 mmol/L — ABNORMAL LOW (ref 98–111)
Creatinine, Ser: 1.2 mg/dL — ABNORMAL HIGH (ref 0.44–1.00)
GFR, Estimated: 47 mL/min — ABNORMAL LOW (ref >60)
Glucose, Bld: 215 mg/dL — ABNORMAL HIGH (ref 70–99)
Potassium: 4.1 mmol/L (ref 3.5–5.1)
Sodium: 127 mmol/L — ABNORMAL LOW (ref 135–145)
Total Bilirubin: 0.8 mg/dL (ref 0.0–1.2)
Total Protein: 7.2 g/dL (ref 6.5–8.1)

## 2023-09-24 LAB — CBC
HCT: 36.1 % (ref 36.0–46.0)
Hemoglobin: 12.3 g/dL (ref 12.0–15.0)
MCH: 27.6 pg (ref 26.0–34.0)
MCHC: 34.1 g/dL (ref 30.0–36.0)
MCV: 80.9 fL (ref 80.0–100.0)
Platelets: 296 10*3/uL (ref 150–400)
RBC: 4.46 MIL/uL (ref 3.87–5.11)
RDW: 12.6 % (ref 11.5–15.5)
WBC: 14.4 10*3/uL — ABNORMAL HIGH (ref 4.0–10.5)
nRBC: 0 % (ref 0.0–0.2)

## 2023-09-24 LAB — URINALYSIS, ROUTINE W REFLEX MICROSCOPIC
Bacteria, UA: NONE SEEN
Bilirubin Urine: NEGATIVE
Glucose, UA: 50 mg/dL — AB
Ketones, ur: NEGATIVE mg/dL
Leukocytes,Ua: NEGATIVE
Nitrite: NEGATIVE
Protein, ur: >=300 mg/dL — AB
Specific Gravity, Urine: 1.012 (ref 1.005–1.030)
pH: 7 (ref 5.0–8.0)

## 2023-09-24 LAB — LIPASE, BLOOD: Lipase: 39 U/L (ref 11–51)

## 2023-09-24 LAB — CBG MONITORING, ED
Glucose-Capillary: 162 mg/dL — ABNORMAL HIGH (ref 70–99)
Glucose-Capillary: 247 mg/dL — ABNORMAL HIGH (ref 70–99)

## 2023-09-24 MED ORDER — IOHEXOL 350 MG/ML SOLN
75.0000 mL | Freq: Once | INTRAVENOUS | Status: AC | PRN
  Administered 2023-09-24: 75 mL via INTRAVENOUS

## 2023-09-24 MED ORDER — AMLODIPINE BESYLATE 5 MG PO TABS
5.0000 mg | ORAL_TABLET | Freq: Every day | ORAL | Status: DC
  Administered 2023-09-24: 5 mg via ORAL
  Filled 2023-09-24: qty 1

## 2023-09-24 MED ORDER — INSULIN ASPART 100 UNIT/ML IJ SOLN
0.0000 [IU] | Freq: Three times a day (TID) | INTRAMUSCULAR | Status: DC
  Administered 2023-09-24: 2 [IU] via SUBCUTANEOUS
  Administered 2023-09-25: 1 [IU] via SUBCUTANEOUS
  Administered 2023-09-25: 3 [IU] via SUBCUTANEOUS
  Administered 2023-09-26 – 2023-09-27 (×3): 2 [IU] via SUBCUTANEOUS
  Administered 2023-09-27: 3 [IU] via SUBCUTANEOUS

## 2023-09-24 MED ORDER — ONDANSETRON HCL 4 MG/2ML IJ SOLN: 4.0000 mg | Freq: Four times a day (QID) | INTRAMUSCULAR | Status: DC | PRN

## 2023-09-24 MED ORDER — MORPHINE SULFATE (PF) 4 MG/ML IV SOLN
4.0000 mg | Freq: Once | INTRAVENOUS | Status: AC
  Administered 2023-09-24: 4 mg via INTRAVENOUS
  Filled 2023-09-24: qty 1

## 2023-09-24 MED ORDER — ACETAMINOPHEN 325 MG PO TABS
650.0000 mg | ORAL_TABLET | Freq: Four times a day (QID) | ORAL | Status: DC | PRN
  Filled 2023-09-24: qty 2

## 2023-09-24 MED ORDER — IRBESARTAN 300 MG PO TABS
150.0000 mg | ORAL_TABLET | Freq: Every day | ORAL | Status: DC
  Administered 2023-09-24: 150 mg via ORAL
  Filled 2023-09-24: qty 1

## 2023-09-24 MED ORDER — AMLODIPINE BESYLATE 5 MG PO TABS: 10.0000 mg | ORAL_TABLET | Freq: Every day | ORAL | Status: DC

## 2023-09-24 MED ORDER — SODIUM CHLORIDE 0.9 % IV SOLN: INTRAVENOUS | Status: DC

## 2023-09-24 MED ORDER — HYDRALAZINE HCL 20 MG/ML IJ SOLN
5.0000 mg | Freq: Three times a day (TID) | INTRAMUSCULAR | Status: DC | PRN
  Filled 2023-09-24: qty 1

## 2023-09-24 MED ORDER — ONDANSETRON HCL 4 MG PO TABS: 4.0000 mg | ORAL_TABLET | Freq: Four times a day (QID) | ORAL | Status: DC | PRN

## 2023-09-24 MED ORDER — MORPHINE SULFATE (PF) 2 MG/ML IV SOLN: 2.0000 mg | INTRAVENOUS | Status: DC | PRN

## 2023-09-24 MED ORDER — IRBESARTAN 300 MG PO TABS
300.0000 mg | ORAL_TABLET | Freq: Every day | ORAL | Status: DC
  Administered 2023-09-26: 300 mg via ORAL
  Filled 2023-09-24: qty 1

## 2023-09-24 MED ORDER — ONDANSETRON HCL 4 MG/2ML IJ SOLN
4.0000 mg | Freq: Once | INTRAMUSCULAR | Status: AC
  Administered 2023-09-24: 4 mg via INTRAVENOUS
  Filled 2023-09-24: qty 2

## 2023-09-24 MED ORDER — AMLODIPINE BESYLATE 5 MG PO TABS
5.0000 mg | ORAL_TABLET | Freq: Every day | ORAL | Status: DC
Start: 2023-09-24 — End: 2023-09-24

## 2023-09-24 MED ORDER — HYDROCHLOROTHIAZIDE 12.5 MG PO TABS: 12.5000 mg | ORAL_TABLET | Freq: Every day | ORAL | Status: DC

## 2023-09-24 MED ORDER — ACETAMINOPHEN 650 MG RE SUPP: 650.0000 mg | Freq: Four times a day (QID) | RECTAL | Status: DC | PRN

## 2023-09-24 MED ORDER — LABETALOL HCL 5 MG/ML IV SOLN
10.0000 mg | Freq: Once | INTRAVENOUS | Status: AC
  Administered 2023-09-24: 10 mg via INTRAVENOUS
  Filled 2023-09-24: qty 4

## 2023-09-24 MED ORDER — SODIUM CHLORIDE 0.9 % IV BOLUS
1000.0000 mL | Freq: Once | INTRAVENOUS | Status: AC
  Administered 2023-09-24: 1000 mL via INTRAVENOUS

## 2023-09-24 NOTE — Assessment & Plan Note (Addendum)
Likely hypovolemia hyponatremia Has not had anything to eat or drink since yesterday and poor water intake  Also mistakenly has been taking 25mg  hydrochlorothiazide instead of 12.5mg  as two prescribers had written and unaware of what she was taking  Her valsartan just increased to 320mg   Continue gentle IVF over night  Discontinue hydrochlorothiazide, start Norvasc Trend

## 2023-09-24 NOTE — Assessment & Plan Note (Addendum)
78 year old female presenting with acute upper abdominal pain with radiation to her back with findings on [place] of slight gallbladder wall thickening and sludge.  -obs to med surge -general surgery consulted with plans for HIDA tomorrow. NPO after midnight. No pain medication after 3AM -gentle IVF -pain control  -troponin flat with no EKG changes  -CTA abdomen/pelvis with no acute inflammatory process identified -consider GLP-1 on differential if w/u negative

## 2023-09-24 NOTE — ED Notes (Signed)
Pt placed on cardiac monitor. Family at bedside. No needs at this time. Call light in reach.

## 2023-09-24 NOTE — Assessment & Plan Note (Signed)
S/p lumpectomy, chemo and radiation Continue arimidex daily

## 2023-09-24 NOTE — Assessment & Plan Note (Signed)
Well-circumscribed opacity in the right breast, not well evaluated on the current exam. Please refer to prior mammogram for details. Mmg in 09/2022 with no acute findings, repeat mmg this month on 2/27

## 2023-09-24 NOTE — Assessment & Plan Note (Signed)
Continue zocor 40mg 

## 2023-09-24 NOTE — ED Triage Notes (Addendum)
Patient complaining of mid-upper abdominal pain that radiates to her back that started yesterday. Endorses nausea- patient states this is not unusual for her since she started ozempic. Last BM yesterday.

## 2023-09-24 NOTE — H&P (Addendum)
History and Physical    Patient: Gloria Mckinney DOB: July 28, 1946 DOA: 09/24/2023 DOS: the patient was seen and examined on 09/24/2023 PCP: Mila Palmer, MD  Patient coming from: Home - lives with family. Ambulates independently. Uses cane up the stairs.  Translation offered, preferred her daughter translate.   Chief Complaint: abdominal pain   HPI: Gloria Mckinney is a 78 y.o. female with medical history significant of GERD, HTN, HLD, T2DM, PVD, hx of breast cancer in left breast s/p lumpectomy and chemo/radiation who presented to ED with abdominal pain in RUQ associated with fatigue and some worsening nausea. Daughter tells me she had pain in her upper quadrant that wrapped around and radiated to her back. Pain started yesterday. Pain was 10/10 and intermittent and sharp. She has had no vomiting, chronic nausea with her GLP-1. She has been on the ozempic x 2 years. She has had no dosage change. Unsure if pain gets worse with food since she hasn't eaten since last night. No fever/chills. She used warm compress and mobic helped some. No diarrhea or change in her bowel habits.    Denies any fever/chills, chest pain or palpitations, shortness of breath or cough, abdominal pain, N/V/D, dysuria or leg swelling.    She does not smoke or drink alcohol.   ER Course:  vitals: afebrile, bp: 209/100, HR: 89, RR: 20, oxygen: 99%RA Pertinent labs: wbc: 14.4, sodium: 127, creatinine: 1.20,  CTA chest/abdomen: No acute aortic syndrome. No acute thoracic aortic intramural hematoma. No thoracoabdominal aortic aneurysm, dissection or penetrating atherosclerotic ulcer. 2. No acute inflammatory process identified within the abdomen or pelvis. No bowel obstruction. 3. Well-circumscribed opacity in the right breast, not well evaluated on the current exam. Please refer to prior mammogram for details. 4. Multiple other nonacute observations, as described above. US  abdomen: Slight wall thickening of the gallbladder with some sludge. No obvious stones or ductal dilatation. Please correlate with symptoms and recommend further workup based on the findings by CT scan such as pre and postcontrast MRI. Aggressive process is in the differential. In ED: general surgery consulted. Given 1L IVF bolus, zofran, morphine and labetalol. TRH asked to admit.   Review of Systems: As mentioned in the history of present illness. All other systems reviewed and are negative. Past Medical History:  Diagnosis Date   Cancer (HCC)    GERD (gastroesophageal reflux disease)    History of radiation therapy 01/16/19- 02/13/19   left breast 15 fractions of 2.67 Gy for a total of 40.05 Gy. Left breast boost 5 fractions of 2 Gy for a total of 10 Gy   Hypertension    Mixed hyperlipidemia    OA (osteoarthritis)    knee right   Peripheral neuropathy    PVD (peripheral vascular disease) (HCC)    Type 2 diabetes mellitus treated with insulin [facility])    endocrinologist--- dr Armen Pickup Brege   Past Surgical History:  Procedure Laterality Date   BREAST BIOPSY Bilateral 2000   benign   BREAST LUMPECTOMY WITH RADIOACTIVE SEED AND SENTINEL LYMPH NODE BIOPSY Left 08/15/2018   Procedure: LEFT BREAST LUMPECTOMY WITH RADIOACTIVE SEED AND LEFT AXILLARY DEEP SENTINEL LYMPH NODE BIOPSY INJECT BLUE DYE LEFT BREAST;  Surgeon: Claud Kelp, MD;  Location: [facility];  Service: General;  Laterality: Left;   LAPAROSCOPIC APPENDECTOMY N/A 12/02/2022   Procedure: APPENDECTOMY LAPAROSCOPIC;  Surgeon: Quentin Ore, MD;  Location: [facility] OR;  Service: General;  Laterality: N/A;   PORT-A-CATH REMOVAL Right 08/09/2019  Procedure: REMOVAL PORT-A-CATH;  Surgeon: Claud Kelp, MD;  Location: [facility];  Service: General;  Laterality: Right;   PORTACATH PLACEMENT Right 08/15/2018   Procedure: INSERTION PORT-A-CATH WITH ULTRASOUND;  Surgeon: Claud Kelp, MD;  Location: [facility]  [place];  Service: General;  Laterality: Right;   SHOULDER ARTHROSCOPY Right 06-16-2002   dr graves   SHOULDER OPEN ROTATOR CUFF REPAIR Right 07-28-2000    dr graves   VAGINAL HYSTERECTOMY  1992   with BSO   WRIST SURGERY  2007   Social History:  reports that she has never smoked. She has never used smokeless tobacco. She reports that she does not drink alcohol and does not use drugs.  Allergies  Allergen Reactions   Penicillins Swelling and Other (See Comments)    Has patient had a PCN reaction causing immediate rash, facial/tongue/throat swelling, SOB or lightheadedness with hypotension: Yes Has patient had a PCN reaction causing severe rash involving mucus membranes or skin necrosis: No Has patient had a PCN reaction that required hospitalization: No Has patient had a PCN reaction occurring within the last 10 years: No If all of the above answers are "NO", then may proceed with Cephalosporin use.    Cephalexin Itching   Latex Swelling   Statins Other (See Comments)    MYALGIAS    Family History  Problem Relation Age of Onset   Hypertension Mother    CVA Mother    Heart disease Mother    Asthma Father    Hypertension Sister    Diabetes Sister    Heart disease Brother    Hypertension Sister    Diabetes Sister    Hypertension Sister    Diabetes Sister    Healthy Son    Healthy Daughter    Healthy Daughter    Healthy Daughter    Breast cancer Neg Hx     Prior to Admission medications   Medication Sig Start Date End Date Taking? Authorizing Provider  anastrozole (ARIMIDEX) 1 MG tablet Take 1 tablet (1 mg total) by mouth daily. 02/17/23   Serena Croissant, MD  aspirin EC 81 MG tablet Take 81 mg by mouth daily.    [provider]  Calcium Carb-Cholecalciferol (CALCIUM + VITAMIN D3 PO) Take 1 tablet by mouth daily. Patient not taking: Reported on 03/19/2023    [provider]  EXFORGE 5-320 MG tablet Take 1 tablet by mouth daily. 09/14/16   [provider]  glimepiride (AMARYL) 4 MG tablet Take 4 mg by mouth 2 (two) times daily.    [provider]  hydrochlorothiazide (HYDRODIURIL) 25 MG tablet Take 25 mg by mouth daily. 09/22/16   [provider]  insulin degludec (TRESIBA) 100 UNIT/ML FlexTouch Pen Inject 45 Units into the skin daily. 06/19/22   [provider]  metFORMIN (GLUCOPHAGE) 1000 MG tablet Take 1,000 mg by mouth 2 (two) times daily with a meal.    [provider]  omeprazole (PRILOSEC) 40 MG capsule Take 40 mg by mouth every morning.  09/14/16   [provider]  simvastatin (ZOCOR) 40 MG tablet Take 40 mg by mouth daily. 09/24/16   [provider]    Physical Exam: Vitals:   09/24/23 1400 09/24/23 1503 09/24/23 1700 09/24/23 1754  BP: (!) 204/88  (!) 171/78 (!) 154/85  Pulse: 72  83   Resp: 19  17   Temp:  97.7 F (36.5 C)    TempSrc:  Oral    SpO2: 100%  97%   Weight:      Height:       General:  Appears calm and comfortable and is in NAD Eyes:  PERRL, EOMI, normal lids, iris ENT:  grossly normal hearing, lips & tongue, mmm; appropriate dentition Neck:  no LAD, masses or thyromegaly; no carotid bruits Cardiovascular:  RRR, no m/r/g. No LE edema.  Respiratory:   CTA bilaterally with no wheezes/rales/rhonchi.  Normal respiratory effort. Abdomen:  soft, NT, ND, NABS. Negative murphy sign  Back:   normal alignment, no CVAT Skin:  no rash or induration seen on limited exam Musculoskeletal:  grossly normal tone BUE/BLE, good ROM, no bony abnormality Lower extremity:  No LE edema.  Limited foot exam with no ulcerations.  2+ distal pulses. Psychiatric:  grossly normal mood and affect, speech fluent and appropriate, AOx3 Neurologic:  CN 2-12 grossly intact, moves all extremities in coordinated fashion, sensation intact   Radiological Exams on Admission: Independently reviewed - see discussion in A/P where applicable  US Abdomen Limited RUQ (LIVER/GB) Result  Date: 09/24/2023 CLINICAL DATA:  Right upper quadrant abdominal pain EXAM: ULTRASOUND ABDOMEN LIMITED RIGHT UPPER QUADRANT COMPARISON:  Ultrasound 12/02/2022.  CT 09/24/2023 earlier. FINDINGS: Gallbladder: Distended gallbladder. Some layering sludge suggested. There is also some wall thickening. No shadowing stones. No reported sonographic Murphy's sign. Common bile duct: Diameter: 9 mm, at the upper limits normal for patient's age Liver: No focal lesion identified. Within normal limits in parenchymal echogenicity. Portal vein is patent on color Doppler imaging with normal direction of blood flow towards the liver. Other: None. IMPRESSION: Slight wall thickening of the gallbladder with some sludge. No obvious stones or ductal dilatation. Please correlate with symptoms and recommend further workup based on the findings by CT scan such as pre and postcontrast MRI. Aggressive process is in the differential. Electronically Signed   By: Karen Kays M.D.   On: 09/24/2023 13:09   CT Angio Chest/Abd/Pel for Dissection W and/or Wo Contrast Result Date: 09/24/2023 CLINICAL DATA:  Acute aortic syndrome (AAS) suspected. Abdominal pain. EXAM: CT ANGIOGRAPHY CHEST, ABDOMEN AND PELVIS TECHNIQUE: Non-contrast CT of the chest was initially obtained. Multidetector CT imaging through the chest, abdomen and pelvis was performed using the standard protocol during bolus administration of intravenous contrast. Multiplanar reconstructed images and MIPs were obtained and reviewed to evaluate the vascular anatomy. RADIATION DOSE REDUCTION: This exam was performed according to the departmental dose-optimization program which includes automated exposure control, adjustment of the mA and/or kV according to patient size and/or use of iterative reconstruction technique. CONTRAST:  75mL OMNIPAQUE IOHEXOL 350 MG/ML SOLN COMPARISON:  CT scan abdomen and pelvis from 12/02/2022 FINDINGS: CTA CHEST FINDINGS Cardiovascular: No intramural hematoma  noted in the thoracic aorta on the unenhanced images. Thoracic aorta is normal in caliber without aneurysm, dissection, vasculitis or significant stenosis. Normal cardiac size. No pericardial effusion. No aortic aneurysm. There are coronary artery calcifications, in keeping with coronary artery disease. There are also mild-to-moderate peripheral atherosclerotic vascular calcifications of thoracic aorta and its major branches. Mediastinum/Nodes: Visualized thyroid gland appears grossly unremarkable. No solid / cystic mediastinal masses. The esophagus is nondistended precluding optimal assessment. No axillary, mediastinal or hilar lymphadenopathy by size criteria. Lungs/Pleura: The central tracheo-bronchial tree is patent. There are dependent changes in bilateral lungs. No mass or consolidation. No pleural effusion or pneumothorax. No suspicious lung nodules. Musculoskeletal: Focal scarring and surgical staples noted in the medial left breast. There is a well-circumscribed proximally 2.1 x 2.9 cm opacity in the right  breast, inferolaterally, not well characterized on the current exam. There is additional smaller opacity in the retroareolar region. Please refer to prior mammogram for details. The visualized soft tissues of the chest wall are grossly unremarkable. No suspicious osseous lesions. There are mild to moderate multilevel degenerative changes in the visualized spine. Review of the MIP images confirms the above findings. CTA ABDOMEN AND PELVIS FINDINGS VASCULAR Aorta: Normal caliber aorta without aneurysm, dissection, vasculitis or significant stenosis. Celiac: Patent without evidence of aneurysm, dissection, vasculitis or significant stenosis. SMA: Patent without evidence of aneurysm, dissection, vasculitis or significant stenosis. Renals: Both renal arteries are patent without evidence of aneurysm, dissection, vasculitis, fibromuscular dysplasia or significant stenosis. IMA: Patent without evidence of  aneurysm, dissection, vasculitis or significant stenosis. Inflow: Patent without evidence of aneurysm, dissection, vasculitis or significant stenosis. Veins: No obvious venous abnormality within the limitations of this arterial phase study. Review of the MIP images confirms the above findings. NON-VASCULAR Hepatobiliary: The liver is normal in size. There is subtle irregularity/nodularity of the liver surface, nonspecific. No frank cirrhotic configuration. Correlate clinically and with liver function tests to exclude liver parenchymal disease. No suspicious mass. No intrahepatic or extrahepatic bile duct dilation. No calcified gallstones. Normal gallbladder wall thickness. No pericholecystic inflammatory changes. Pancreas: Unremarkable. No pancreatic ductal dilatation or surrounding inflammatory changes. Spleen: Within normal limits. No focal lesion. Adrenals/Urinary Tract: Adrenal glands are unremarkable. No suspicious renal mass. No hydronephrosis. No renal or ureteric calculi. Unremarkable urinary bladder. Stomach/Bowel: There is a small sliding hiatal hernia. There is a small diverticulum arising from the second part of duodenum. No disproportionate dilation of the small or large bowel loops. No evidence of abnormal bowel wall thickening or inflammatory changes. The appendix is surgically absent. Vascular/Lymphatic: No ascites or pneumoperitoneum. No abdominal or pelvic lymphadenopathy, by size criteria. No aneurysmal dilation of the major abdominal arteries. There are mild peripheral atherosclerotic vascular calcifications of the aorta and its major branches. Reproductive: The uterus is surgically absent. No large adnexal mass. Other: There is a tiny fat containing umbilical hernia. The soft tissues and abdominal wall are otherwise unremarkable. Musculoskeletal: No suspicious osseous lesions. There are mild - moderate multilevel degenerative changes in the visualized spine. Review of the MIP images confirms the  above findings. IMPRESSION: 1. No acute aortic syndrome. No acute thoracic aortic intramural hematoma. No thoracoabdominal aortic aneurysm, dissection or penetrating atherosclerotic ulcer. 2. No acute inflammatory process identified within the abdomen or pelvis. No bowel obstruction. 3. Well-circumscribed opacity in the right breast, not well evaluated on the current exam. Please refer to prior mammogram for details. 4. Multiple other nonacute observations, as described above. Aortic Atherosclerosis (ICD10-I70.0). Electronically Signed   By: Jules Schick M.D.   On: 09/24/2023 10:47    EKG: Independently reviewed.  NSR with rate 94; nonspecific ST changes with no evidence of acute ischemia   Labs on Admission: I have personally reviewed the available labs and imaging studies at the time of the admission.  Pertinent labs:   wbc: 14.4,  sodium: 127,  creatinine: 1.20  Assessment and Plan: Principal Problem:   Abdominal pain Active Problems:   Hyponatremia   Leukocytosis   Hypertensive urgency   Type 2 diabetes mellitus without complication, without long-term current use of insulin (HCC)   Stage 2 chronic kidney disease   Mixed hyperlipidemia   Gastro-esophageal reflux disease without esophagitis   Dry mouth and eyes   abnormal imaging  of right breast   Malignant neoplasm of upper-inner quadrant of  left breast in female, estrogen receptor positive (HCC)    Assessment and Plan: * Abdominal pain 78 year old female presenting with acute upper abdominal pain with radiation to her back with findings on [place] of slight gallbladder wall thickening and sludge.  -obs to med surge -general surgery consulted with plans for HIDA tomorrow. NPO after midnight. No pain medication after 3AM -gentle IVF -pain control  -troponin flat with no EKG changes  -CTA abdomen/pelvis with no acute inflammatory process identified -consider GLP-1 on differential if w/u negative    Hyponatremia Likely  hypovolemia hyponatremia Has not had anything to eat or drink since yesterday and poor water intake  Also mistakenly has been taking 25mg  hydrochlorothiazide instead of 12.5mg  as two prescribers had written and unaware of what she was taking  Her valsartan just increased to 320mg   Continue gentle IVF over night  Discontinue hydrochlorothiazide, start Norvasc Trend   Leukocytosis Likely hemoconcentrated vs. Inflammatory No other SIRS criteria or source of infectious cause Remains afebrile Gentle IVF and trend cbc/fever curve   Hypertensive urgency Extremely elevated, will start back home medication Continue tele Minimally elevated troponin at 22 and flat, no chest pain and EKG with no acute changes  She is on hydrochlorothiazide 12.5mg  stop this with  hyponatremia and change to norvasc 5mg  daily   valsartan 160mg  daily  Creatinine at baseline per office notes  Hydralazine PRN IV    Type 2 diabetes mellitus without complication, without long-term current use of insulin (HCC) A1C was 7.6 about 2 weeks ago  She is on ozempic and insulin  She has not been taking her insulin for over a month  Hold glimepiride and metformin  SSI and accuchecks QAC/HS  Consider GLP-1 as cause of her pain if work up negative   Stage 2 chronic kidney disease At baseline of 1.2 Gentle IVF Trend   Mixed hyperlipidemia Continue zocor 40mg    Gastro-esophageal reflux disease without esophagitis Continue PPI   Dry mouth and eyes Check ANA Sjogren Abs TSH normal   abnormal imaging  of right breast Well-circumscribed opacity in the right breast, not well evaluated on the current exam. Please refer to prior mammogram for details. Mmg in 09/2022 with no acute findings, repeat mmg this month on 2/27   Malignant neoplasm of upper-inner quadrant of left breast in female, estrogen receptor positive (HCC) S/p lumpectomy, chemo and radiation Continue arimidex daily      Advance Care Planning:   Code  Status: Full Code   Consults: general surgery  DVT Prophylaxis: SCDs  Family Communication: daughter at bedside   Severity of Illness: The appropriate patient status for this patient is OBSERVATION. Observation status is judged to be reasonable and necessary in order to provide the required intensity of service to ensure the patient's safety. The patient's presenting symptoms, physical exam findings, and initial radiographic and laboratory data in the context of their medical condition is felt to place them at decreased risk for further clinical deterioration. Furthermore, it is anticipated that the patient will be medically stable for discharge from the hospital within 2 midnights of admission.   Author: Orland Mustard, MD 09/24/2023 6:31 PM  For on call review [email or web address].

## 2023-09-24 NOTE — Assessment & Plan Note (Signed)
Check ANA Sjogren Abs TSH normal

## 2023-09-24 NOTE — Assessment & Plan Note (Signed)
At baseline of 1.2 Gentle IVF Trend

## 2023-09-24 NOTE — Assessment & Plan Note (Addendum)
Extremely elevated, will start back home medication Continue tele Minimally elevated troponin at 22 and flat, no chest pain and EKG with no acute changes  She is on hydrochlorothiazide 12.5mg  stop this with  hyponatremia and change to norvasc 5mg  daily   valsartan 160mg  daily  Creatinine at baseline per office notes  Hydralazine PRN IV

## 2023-09-24 NOTE — ED Notes (Addendum)
Per [facility] pt needs to be NPO and no pain meds for 6 hrs prior to HIDA (NM Hepatobilliary Liver Function) scan.  They will aim to do scan around 9am tomorrow.  Please keep pt NPO and no pain meds after 3am per [facility].

## 2023-09-24 NOTE — Assessment & Plan Note (Addendum)
A1C was 7.6 about 2 weeks ago  She is on ozempic and insulin  She has not been taking her insulin for over a month  Hold glimepiride and metformin  SSI and accuchecks QAC/HS  Consider GLP-1 as cause of her pain if work up negative

## 2023-09-24 NOTE — ED Notes (Signed)
Bgl 162

## 2023-09-24 NOTE — Assessment & Plan Note (Signed)
Continue PPI ?

## 2023-09-24 NOTE — Consult Note (Signed)
Consult Note  Gloria Mckinney 06-Jan-1946  [id number].    Requesting MD: Alvino Blood, MD Chief Complaint/Reason for Consult: RUQ pain, concern for cholecystitis  HPI:  Patient is a 78 year old female who presented to the ED with mid upper abdominal pain that radiates to her back. Pain started last night at 9 PM and got worse today around 4 AM. she also reports associated nausea with one episode of emesis yesterday. Patient reports chronic nausea since starting ozempic. She last had a BM yesterday which was loose. At baseline she is constipated and takes stool softeners. Increased fatigue but denies fever, chills.   PMH otherwise significant for Hx of breast cancer s/p lumpectomy and chemo/radiation, IDDM, GERD, HTN, HLD, PVD, Peripheral neuropathy and Osteoarthritis. Prior abdominal surgery includes laparoscopic appendectomy. Not on any blood thinners. Allergies to PCNs, cephalexin, and latex. Denies alcohol, tobacco or illicit drug use.   Patient declines medical interpreter and prefers daughter to translate for her.  ROS: Negative other than HPI  Family History  Problem Relation Age of Onset   Hypertension Mother    CVA Mother    Heart disease Mother    Asthma Father    Hypertension Sister    Diabetes Sister    Heart disease Brother    Hypertension Sister    Diabetes Sister    Hypertension Sister    Diabetes Sister    Healthy Son    Healthy Daughter    Healthy Daughter    Healthy Daughter    Breast cancer Neg Hx     Past Medical History:  Diagnosis Date   Cancer (HCC)    GERD (gastroesophageal reflux disease)    History of radiation therapy 01/16/19- 02/13/19   left breast 15 fractions of 2.67 Gy for a total of 40.05 Gy. Left breast boost 5 fractions of 2 Gy for a total of 10 Gy   Hypertension    Mixed hyperlipidemia    OA (osteoarthritis)    knee right   Peripheral neuropathy    PVD (peripheral vascular disease) (HCC)    Type 2 diabetes  mellitus treated with insulin [facility])    endocrinologist--- dr Armen Pickup [place]    Past Surgical History:  Procedure Laterality Date   BREAST BIOPSY Bilateral 2000   benign   BREAST LUMPECTOMY WITH RADIOACTIVE SEED AND SENTINEL LYMPH NODE BIOPSY Left 08/15/2018   Procedure: LEFT BREAST LUMPECTOMY WITH RADIOACTIVE SEED AND LEFT AXILLARY DEEP SENTINEL LYMPH NODE BIOPSY INJECT BLUE DYE LEFT BREAST;  Surgeon: Claud Kelp, MD;  Location: [facility];  Service: General;  Laterality: Left;   LAPAROSCOPIC APPENDECTOMY N/A 12/02/2022   Procedure: APPENDECTOMY LAPAROSCOPIC;  Surgeon: Quentin Ore, MD;  Location: [facility] OR;  Service: General;  Laterality: N/A;   PORT-A-CATH REMOVAL Right 08/09/2019   Procedure: REMOVAL PORT-A-CATH;  Surgeon: Claud Kelp, MD;  Location: [facility];  Service: General;  Laterality: Right;   PORTACATH PLACEMENT Right 08/15/2018   Procedure: INSERTION PORT-A-CATH WITH ULTRASOUND;  Surgeon: Claud Kelp, MD;  Location: [facility];  Service: General;  Laterality: Right;   SHOULDER ARTHROSCOPY Right 06-16-2002   dr graves   SHOULDER OPEN ROTATOR CUFF REPAIR Right 07-28-2000    dr graves   VAGINAL HYSTERECTOMY  1992   with BSO   WRIST SURGERY  2007    Social History:  reports that she has never smoked. She has never used smokeless tobacco. She reports that she does not drink alcohol  and does not use drugs.  Allergies:  Allergies  Allergen Reactions   Latex Swelling   Penicillins Swelling and Other (See Comments)    Has patient had a PCN reaction causing immediate rash, facial/tongue/throat swelling, SOB or lightheadedness with hypotension: Yes Has patient had a PCN reaction causing severe rash involving mucus membranes or skin necrosis: No Has patient had a PCN reaction that required hospitalization: No Has patient had a PCN reaction occurring within the last 10 years: No If all of the above answers are "NO", then may  proceed with Cephalosporin use.    Cephalexin Itching   Statins Other (See Comments)    MYALGIAS    (Not in a hospital admission)   Blood pressure (!) 218/96, pulse 87, temperature 98.1 F (36.7 C), temperature source Oral, resp. rate 16, height 5\' 2"  (1.575 m), weight 77.1 kg, SpO2 100%. Physical Exam:  General: pleasant, WD,  female who is laying in bed in NAD HEENT: head is normocephalic, atraumatic.  Sclera are noninjected.  PERRL.  Ears and nose without any masses or lesions.   Lungs: CTAB, no wheezes, rhonchi, or rales noted.  Respiratory effort nonlabored Abd: soft, mild distention, mild tendernes RUQ/flank but this seems mostly subjective - no guarding.  +BS, no masses, hernias, or organomegaly MS: all 4 extremities are symmetrical with no cyanosis, clubbing, or edema. Skin: warm and dry with no masses, lesions, or rashes Neuro: Cranial nerves 2-12 grossly intact, sensation is normal throughout Psych: A&Ox3 with an appropriate affect.   Results for orders placed or performed during the hospital encounter of 09/24/23 (from the past 48 hours)  Lipase, blood     Status: None   Collection Time: 09/24/23  6:53 AM  Result Value Ref Range   Lipase 39 11 - 51 U/L    Comment: Performed at [facility], [address] [place], [place] [address]  Comprehensive metabolic panel     Status: Abnormal   Collection Time: 09/24/23  6:53 AM  Result Value Ref Range   Sodium 127 (L) 135 - 145 mmol/L   Potassium 4.1 3.5 - 5.1 mmol/L   Chloride 90 (L) 98 - 111 mmol/L   CO2 23 22 - 32 mmol/L   Glucose, Bld 215 (H) 70 - 99 mg/dL    Comment: Glucose reference range applies only to samples taken after fasting for at least 8 hours.   BUN 17 8 - 23 mg/dL   Creatinine, Ser 6.04 (H) 0.44 - 1.00 mg/dL   Calcium 9.6 8.9 - 54.0 mg/dL   Total Protein 7.2 6.5 - 8.1 g/dL   Albumin 3.7 3.5 - 5.0 g/dL   AST 18 15 - 41 U/L   ALT 15 0 - 44 U/L   Alkaline Phosphatase 67 38 - 126 U/L   Total  Bilirubin 0.8 0.0 - 1.2 mg/dL   GFR, Estimated 47 (L) >60 mL/min    Comment: (NOTE) Calculated using the CKD-EPI Creatinine Equation (2021)    Anion gap 14 5 - 15    Comment: Performed at [facility], [address], [place], [place] [address]  CBC     Status: Abnormal   Collection Time: 09/24/23  6:53 AM  Result Value Ref Range   WBC 14.4 (H) 4.0 - 10.5 K/uL   RBC 4.46 3.87 - 5.11 MIL/uL   Hemoglobin 12.3 12.0 - 15.0 g/dL   HCT 14.7 82.9 - 56.2 %   MCV 80.9 80.0 - 100.0 fL   MCH  27.6 26.0 - 34.0 pg   MCHC 34.1 30.0 - 36.0 g/dL   RDW 16.1 09.6 - 04.5 %   Platelets 296 150 - 400 K/uL   nRBC 0.0 0.0 - 0.2 %    Comment: Performed at [facility], [address] [place], [place] [address]  Troponin I (High Sensitivity)     Status: Abnormal   Collection Time: 09/24/23  6:53 AM  Result Value Ref Range   Troponin I (High Sensitivity) 23 (H) <18 ng/L    Comment: (NOTE) Elevated high sensitivity troponin I (hsTnI) values and significant  changes across serial measurements may suggest ACS but many other  chronic and acute conditions are known to elevate hsTnI results.  Refer to the "Links" section for chest pain algorithms and additional  guidance. Performed at [facility], [address] [place], [place] [address]   Urinalysis, Routine w reflex microscopic -Urine, Clean Catch     Status: Abnormal   Collection Time: 09/24/23  7:11 AM  Result Value Ref Range   Color, Urine YELLOW YELLOW   APPearance CLEAR CLEAR   Specific Gravity, Urine 1.012 1.005 - 1.030   pH 7.0 5.0 - 8.0   Glucose, UA 50 (A) NEGATIVE mg/dL   Hgb urine dipstick SMALL (A) NEGATIVE   Bilirubin Urine NEGATIVE NEGATIVE   Ketones, ur NEGATIVE NEGATIVE mg/dL   Protein, ur >=829 (A) NEGATIVE mg/dL   Nitrite NEGATIVE NEGATIVE   Leukocytes,Ua NEGATIVE NEGATIVE   RBC / HPF 6-10 0 - 5 RBC/hpf   WBC, UA 0-5 0 - 5 WBC/hpf   Bacteria, UA NONE SEEN NONE SEEN   Squamous Epithelial / HPF 0-5 0 - 5 /HPF    Mucus PRESENT    Hyaline Casts, UA PRESENT     Comment: Performed at [facility], [address], [place], [place] [address]  I-stat chem 8, ED (not at [facility], [facility] or [facility])     Status: Abnormal   Collection Time: 09/24/23  7:28 AM  Result Value Ref Range   Sodium 128 (L) 135 - 145 mmol/L   Potassium 4.0 3.5 - 5.1 mmol/L   Chloride 91 (L) 98 - 111 mmol/L   BUN 19 8 - 23 mg/dL   Creatinine, Ser 0.86 (H) 0.44 - 1.00 mg/dL   Glucose, Bld 578 (H) 70 - 99 mg/dL    Comment: Glucose reference range applies only to samples taken after fasting for at least 8 hours.   Calcium, Ion 1.13 (L) 1.15 - 1.40 mmol/L   TCO2 25 22 - 32 mmol/L   Hemoglobin 13.6 12.0 - 15.0 g/dL   HCT 46.9 62.9 - 52.8 %   US Abdomen Limited RUQ (LIVER/GB) Result Date: 09/24/2023 CLINICAL DATA:  Right upper quadrant abdominal pain EXAM: ULTRASOUND ABDOMEN LIMITED RIGHT UPPER QUADRANT COMPARISON:  Ultrasound 12/02/2022.  CT 09/24/2023 earlier. FINDINGS: Gallbladder: Distended gallbladder. Some layering sludge suggested. There is also some wall thickening. No shadowing stones. No reported sonographic Murphy's sign. Common bile duct: Diameter: 9 mm, at the upper limits normal for patient's age Liver: No focal lesion identified. Within normal limits in parenchymal echogenicity. Portal vein is patent on color Doppler imaging with normal direction of blood flow towards the liver. Other: None. IMPRESSION: Slight wall thickening of the gallbladder with some sludge. No obvious stones or ductal dilatation. Please correlate with symptoms and recommend further workup based on the findings by CT scan such as pre and postcontrast MRI. Aggressive process is in the  differential. Electronically Signed   By: Karen Kays M.D.   On: 09/24/2023 13:09   CT Angio Chest/Abd/Pel for Dissection W and/or Wo Contrast Result Date: 09/24/2023 CLINICAL DATA:  Acute aortic syndrome (AAS) suspected. Abdominal pain. EXAM: CT ANGIOGRAPHY CHEST, ABDOMEN AND  PELVIS TECHNIQUE: Non-contrast CT of the chest was initially obtained. Multidetector CT imaging through the chest, abdomen and pelvis was performed using the standard protocol during bolus administration of intravenous contrast. Multiplanar reconstructed images and MIPs were obtained and reviewed to evaluate the vascular anatomy. RADIATION DOSE REDUCTION: This exam was performed according to the departmental dose-optimization program which includes automated exposure control, adjustment of the mA and/or kV according to patient size and/or use of iterative reconstruction technique. CONTRAST:  75mL OMNIPAQUE IOHEXOL 350 MG/ML SOLN COMPARISON:  CT scan abdomen and pelvis from 12/02/2022 FINDINGS: CTA CHEST FINDINGS Cardiovascular: No intramural hematoma noted in the thoracic aorta on the unenhanced images. Thoracic aorta is normal in caliber without aneurysm, dissection, vasculitis or significant stenosis. Normal cardiac size. No pericardial effusion. No aortic aneurysm. There are coronary artery calcifications, in keeping with coronary artery disease. There are also mild-to-moderate peripheral atherosclerotic vascular calcifications of thoracic aorta and its major branches. Mediastinum/Nodes: Visualized thyroid gland appears grossly unremarkable. No solid / cystic mediastinal masses. The esophagus is nondistended precluding optimal assessment. No axillary, mediastinal or hilar lymphadenopathy by size criteria. Lungs/Pleura: The central tracheo-bronchial tree is patent. There are dependent changes in bilateral lungs. No mass or consolidation. No pleural effusion or pneumothorax. No suspicious lung nodules. Musculoskeletal: Focal scarring and surgical staples noted in the medial left breast. There is a well-circumscribed proximally 2.1 x 2.9 cm opacity in the right breast, inferolaterally, not well characterized on the current exam. There is additional smaller opacity in the retroareolar region. Please refer to prior  mammogram for details. The visualized soft tissues of the chest wall are grossly unremarkable. No suspicious osseous lesions. There are mild to moderate multilevel degenerative changes in the visualized spine. Review of the MIP images confirms the above findings. CTA ABDOMEN AND PELVIS FINDINGS VASCULAR Aorta: Normal caliber aorta without aneurysm, dissection, vasculitis or significant stenosis. Celiac: Patent without evidence of aneurysm, dissection, vasculitis or significant stenosis. SMA: Patent without evidence of aneurysm, dissection, vasculitis or significant stenosis. Renals: Both renal arteries are patent without evidence of aneurysm, dissection, vasculitis, fibromuscular dysplasia or significant stenosis. IMA: Patent without evidence of aneurysm, dissection, vasculitis or significant stenosis. Inflow: Patent without evidence of aneurysm, dissection, vasculitis or significant stenosis. Veins: No obvious venous abnormality within the limitations of this arterial phase study. Review of the MIP images confirms the above findings. NON-VASCULAR Hepatobiliary: The liver is normal in size. There is subtle irregularity/nodularity of the liver surface, nonspecific. No frank cirrhotic configuration. Correlate clinically and with liver function tests to exclude liver parenchymal disease. No suspicious mass. No intrahepatic or extrahepatic bile duct dilation. No calcified gallstones. Normal gallbladder wall thickness. No pericholecystic inflammatory changes. Pancreas: Unremarkable. No pancreatic ductal dilatation or surrounding inflammatory changes. Spleen: Within normal limits. No focal lesion. Adrenals/Urinary Tract: Adrenal glands are unremarkable. No suspicious renal mass. No hydronephrosis. No renal or ureteric calculi. Unremarkable urinary bladder. Stomach/Bowel: There is a small sliding hiatal hernia. There is a small diverticulum arising from the second part of duodenum. No disproportionate dilation of the small  or large bowel loops. No evidence of abnormal bowel wall thickening or inflammatory changes. The appendix is surgically absent. Vascular/Lymphatic: No ascites or pneumoperitoneum. No abdominal or pelvic lymphadenopathy, by size criteria.  No aneurysmal dilation of the major abdominal arteries. There are mild peripheral atherosclerotic vascular calcifications of the aorta and its major branches. Reproductive: The uterus is surgically absent. No large adnexal mass. Other: There is a tiny fat containing umbilical hernia. The soft tissues and abdominal wall are otherwise unremarkable. Musculoskeletal: No suspicious osseous lesions. There are mild - moderate multilevel degenerative changes in the visualized spine. Review of the MIP images confirms the above findings. IMPRESSION: 1. No acute aortic syndrome. No acute thoracic aortic intramural hematoma. No thoracoabdominal aortic aneurysm, dissection or penetrating atherosclerotic ulcer. 2. No acute inflammatory process identified within the abdomen or pelvis. No bowel obstruction. 3. Well-circumscribed opacity in the right breast, not well evaluated on the current exam. Please refer to prior mammogram for details. 4. Multiple other nonacute observations, as described above. Aortic Atherosclerosis (ICD10-I70.0). Electronically Signed   By: Jules Schick M.D.   On: 09/24/2023 10:47      Assessment/Plan RUQ abdominal pain - CTA negative for gallstones or gallbladder wall thickening, no pericholecystic inflammatory changes, subtle nodularity of liver surface - [place] with sludge but no stones, slight wall thickening, no sonographic murphy sign  - mild leukocytosis of 14K, afebrile, not tachycardic, hypertensive - no elevation in LFTs - low suspicion for acute cholecystitis, HIDA scan is pending. Hold off on abx for now.   Would recommend medical admission given hypertensive urgency with elevated cr and electrolyte derangements.   FEN: NPO, IVF per [facility] - appears  dehydrated VTE: ok to have SQH or LMWH from surgical standpoint ID: none, await HIDA results   - per [facility] -  Hx of breast cancer s/p lumpectomy and chemo/radiation IDDM GERD HTN HLD PVD Peripheral neuropathy  Osteoarthritis   I reviewed ED provider notes, last 24 h vitals and pain scores, last 48 h intake and output, last 24 h labs and trends, and last 24 h imaging results.  This care required high  level of medical decision making.   Hosie Spangle, [facility] Surgery 09/24/2023, 1:37 PM Please see Amion for pager number during day hours 7:00am-4:30pm

## 2023-09-24 NOTE — Assessment & Plan Note (Signed)
Likely hemoconcentrated vs. Inflammatory No other SIRS criteria or source of infectious cause Remains afebrile Gentle IVF and trend cbc/fever curve

## 2023-09-24 NOTE — ED Provider Notes (Signed)
[place] EMERGENCY DEPARTMENT AT [facility] Provider Note  CSN: [id number] Arrival date & time: 09/24/23 4098  Chief Complaint(s) Abdominal Pain  HPI Gloria Mckinney is a 78 y.o. female history of peripheral vascular disease, diabetes, hyperlipidemia, hypertension, presenting to the emergency room with abdominal pain.  Patient reports pain is in the right upper quadrant, radiates around the back.  Has not noticed any urine symptoms.  Has chronic nausea since being on Ozempic, possibly slightly worse, did have episode of vomiting yesterday, 1 episode of diarrhea yesterday.  No hematemesis, hematochezia.  No chest pain, shortness of breath.  No syncope.  Reports increased fatigue but no fevers or chills.  Patient prefers her daughter interprets for her and refuses interpreter   Past Medical History Past Medical History:  Diagnosis Date   Cancer (HCC)    GERD (gastroesophageal reflux disease)    History of radiation therapy 01/16/19- 02/13/19   left breast 15 fractions of 2.67 Gy for a total of 40.05 Gy. Left breast boost 5 fractions of 2 Gy for a total of 10 Gy   Hypertension    Mixed hyperlipidemia    OA (osteoarthritis)    knee right   Peripheral neuropathy    PVD (peripheral vascular disease) (HCC)    Type 2 diabetes mellitus treated with insulin [facility])    endocrinologist--- dr Armen Pickup [place]   Patient Active Problem List   Diagnosis Date Noted   Abdominal pain 09/24/2023   Acute appendicitis 12/02/2022   Gastro-esophageal reflux disease without esophagitis 07/30/2021   Peripheral vascular disease (HCC) 07/30/2021   Stage 2 chronic kidney disease 07/30/2021   Port-A-Cath in place 09/19/2018   Malignant neoplasm of upper-inner quadrant of left breast in female, estrogen receptor positive (HCC) 07/27/2018   Essential hypertension 11/08/2017   Mixed hyperlipidemia 11/08/2017   Dyspnea on exertion 10/21/2016   Type 2 diabetes mellitus without complication,  without long-term current use of insulin (HCC) 10/21/2016   Home Medication(s) Prior to Admission medications   Medication Sig Start Date End Date Taking? Authorizing Provider  anastrozole (ARIMIDEX) 1 MG tablet Take 1 tablet (1 mg total) by mouth daily. 02/17/23   Serena Croissant, MD  aspirin EC 81 MG tablet Take 81 mg by mouth daily.    [provider]  Calcium Carb-Cholecalciferol (CALCIUM + VITAMIN D3 PO) Take 1 tablet by mouth daily. Patient not taking: Reported on 03/19/2023    [provider]  EXFORGE 5-320 MG tablet Take 1 tablet by mouth daily. 09/14/16   [provider]  glimepiride (AMARYL) 4 MG tablet Take 4 mg by mouth 2 (two) times daily.    [provider]  hydrochlorothiazide (HYDRODIURIL) 25 MG tablet Take 25 mg by mouth daily. 09/22/16   [provider]  insulin degludec (TRESIBA) 100 UNIT/ML FlexTouch Pen Inject 45 Units into the skin daily. 06/19/22   [provider]  metFORMIN (GLUCOPHAGE) 1000 MG tablet Take 1,000 mg by mouth 2 (two) times daily with a meal.    [provider]  omeprazole (PRILOSEC) 40 MG capsule Take 40 mg by mouth every morning.  09/14/16   [provider]  simvastatin (ZOCOR) 40 MG tablet Take 40 mg by mouth daily. 09/24/16   [provider]  Past Surgical History Past Surgical History:  Procedure Laterality Date   BREAST BIOPSY Bilateral 2000   benign   BREAST LUMPECTOMY WITH RADIOACTIVE SEED AND SENTINEL LYMPH NODE BIOPSY Left 08/15/2018   Procedure: LEFT BREAST LUMPECTOMY WITH RADIOACTIVE SEED AND LEFT AXILLARY DEEP SENTINEL LYMPH NODE BIOPSY INJECT BLUE DYE LEFT BREAST;  Surgeon: Claud Kelp, MD;  Location: [facility];  Service: General;  Laterality: Left;   LAPAROSCOPIC APPENDECTOMY N/A 12/02/2022   Procedure: APPENDECTOMY  LAPAROSCOPIC;  Surgeon: Quentin Ore, MD;  Location: [facility] OR;  Service: General;  Laterality: N/A;   PORT-A-CATH REMOVAL Right 08/09/2019   Procedure: REMOVAL PORT-A-CATH;  Surgeon: Claud Kelp, MD;  Location: [facility];  Service: General;  Laterality: Right;   PORTACATH PLACEMENT Right 08/15/2018   Procedure: INSERTION PORT-A-CATH WITH ULTRASOUND;  Surgeon: Claud Kelp, MD;  Location: [facility];  Service: General;  Laterality: Right;   SHOULDER ARTHROSCOPY Right 06-16-2002   dr graves   SHOULDER OPEN ROTATOR CUFF REPAIR Right 07-28-2000    dr graves   VAGINAL HYSTERECTOMY  1992   with BSO   WRIST SURGERY  2007   Family History Family History  Problem Relation Age of Onset   Hypertension Mother    CVA Mother    Heart disease Mother    Asthma Father    Hypertension Sister    Diabetes Sister    Heart disease Brother    Hypertension Sister    Diabetes Sister    Hypertension Sister    Diabetes Sister    Healthy Son    Healthy Daughter    Healthy Daughter    Healthy Daughter    Breast cancer Neg Hx     Social History Social History   Tobacco Use   Smoking status: Never   Smokeless tobacco: Never  Vaping Use   Vaping status: Never Used  Substance Use Topics   Alcohol use: Never   Drug use: Never   Allergies Latex, Penicillins, Cephalexin, and Statins  Review of Systems Review of Systems  All other systems reviewed and are negative.   Physical Exam Vital Signs  I have reviewed the triage vital signs BP (!) 204/88   Pulse 72   Temp 97.7 F (36.5 C) (Oral)   Resp 19   Ht 5\' 2"  (1.575 m)   Wt 77.1 kg   SpO2 100%   BMI 31.09 kg/m  Physical Exam Vitals and nursing note reviewed.  Constitutional:      General: She is not in acute distress.    Appearance: She is well-developed.  HENT:     Head: Normocephalic and atraumatic.     Mouth/Throat:     Mouth: Mucous membranes are moist.  Eyes:     Pupils: Pupils are  equal, round, and reactive to light.  Cardiovascular:     Rate and Rhythm: Normal rate and regular rhythm.     Heart sounds: No murmur heard. Pulmonary:     Effort: Pulmonary effort is normal. No respiratory distress.     Breath sounds: Normal breath sounds.  Abdominal:     General: Abdomen is flat.     Palpations: Abdomen is soft.     Tenderness: There is abdominal tenderness in the right upper quadrant. There is no right CVA tenderness or left CVA tenderness.  Musculoskeletal:        General: No tenderness.     Right lower leg: No edema.     Left lower leg: No  edema.  Skin:    General: Skin is warm and dry.  Neurological:     General: No focal deficit present.     Mental Status: She is alert. Mental status is at baseline.  Psychiatric:        Mood and Affect: Mood normal.        Behavior: Behavior normal.     ED Results and Treatments Labs (all labs ordered are listed, but only abnormal results are displayed) Labs Reviewed  COMPREHENSIVE METABOLIC PANEL - Abnormal; Notable for the following components:      Result Value   Sodium 127 (*)    Chloride 90 (*)    Glucose, Bld 215 (*)    Creatinine, Ser 1.20 (*)    GFR, Estimated 47 (*)    All other components within normal limits  CBC - Abnormal; Notable for the following components:   WBC 14.4 (*)    All other components within normal limits  URINALYSIS, ROUTINE W REFLEX MICROSCOPIC - Abnormal; Notable for the following components:   Glucose, UA 50 (*)    Hgb urine dipstick SMALL (*)    Protein, ur >=300 (*)    All other components within normal limits  I-STAT CHEM 8, ED - Abnormal; Notable for the following components:   Sodium 128 (*)    Chloride 91 (*)    Creatinine, Ser 1.10 (*)    Glucose, Bld 216 (*)    Calcium, Ion 1.13 (*)    All other components within normal limits  TROPONIN I (HIGH SENSITIVITY) - Abnormal; Notable for the following components:   Troponin I (High Sensitivity) 23 (*)    All other  components within normal limits  TROPONIN I (HIGH SENSITIVITY) - Abnormal; Notable for the following components:   Troponin I (High Sensitivity) 22 (*)    All other components within normal limits  LIPASE, BLOOD  HEMOGLOBIN A1C                                                                                                                          Radiology US Abdomen Limited RUQ (LIVER/GB) Result Date: 09/24/2023 CLINICAL DATA:  Right upper quadrant abdominal pain EXAM: ULTRASOUND ABDOMEN LIMITED RIGHT UPPER QUADRANT COMPARISON:  Ultrasound 12/02/2022.  CT 09/24/2023 earlier. FINDINGS: Gallbladder: Distended gallbladder. Some layering sludge suggested. There is also some wall thickening. No shadowing stones. No reported sonographic Murphy's sign. Common bile duct: Diameter: 9 mm, at the upper limits normal for patient's age Liver: No focal lesion identified. Within normal limits in parenchymal echogenicity. Portal vein is patent on color Doppler imaging with normal direction of blood flow towards the liver. Other: None. IMPRESSION: Slight wall thickening of the gallbladder with some sludge. No obvious stones or ductal dilatation. Please correlate with symptoms and recommend further workup based on the findings by CT scan such as pre and postcontrast MRI. Aggressive process is in the differential. Electronically Signed   By: Karen Kays M.D.   On:  09/24/2023 13:09   CT Angio Chest/Abd/Pel for Dissection W and/or Wo Contrast Result Date: 09/24/2023 CLINICAL DATA:  Acute aortic syndrome (AAS) suspected. Abdominal pain. EXAM: CT ANGIOGRAPHY CHEST, ABDOMEN AND PELVIS TECHNIQUE: Non-contrast CT of the chest was initially obtained. Multidetector CT imaging through the chest, abdomen and pelvis was performed using the standard protocol during bolus administration of intravenous contrast. Multiplanar reconstructed images and MIPs were obtained and reviewed to evaluate the vascular anatomy. RADIATION DOSE  REDUCTION: This exam was performed according to the departmental dose-optimization program which includes automated exposure control, adjustment of the mA and/or kV according to patient size and/or use of iterative reconstruction technique. CONTRAST:  75mL OMNIPAQUE IOHEXOL 350 MG/ML SOLN COMPARISON:  CT scan abdomen and pelvis from 12/02/2022 FINDINGS: CTA CHEST FINDINGS Cardiovascular: No intramural hematoma noted in the thoracic aorta on the unenhanced images. Thoracic aorta is normal in caliber without aneurysm, dissection, vasculitis or significant stenosis. Normal cardiac size. No pericardial effusion. No aortic aneurysm. There are coronary artery calcifications, in keeping with coronary artery disease. There are also mild-to-moderate peripheral atherosclerotic vascular calcifications of thoracic aorta and its major branches. Mediastinum/Nodes: Visualized thyroid gland appears grossly unremarkable. No solid / cystic mediastinal masses. The esophagus is nondistended precluding optimal assessment. No axillary, mediastinal or hilar lymphadenopathy by size criteria. Lungs/Pleura: The central tracheo-bronchial tree is patent. There are dependent changes in bilateral lungs. No mass or consolidation. No pleural effusion or pneumothorax. No suspicious lung nodules. Musculoskeletal: Focal scarring and surgical staples noted in the medial left breast. There is a well-circumscribed proximally 2.1 x 2.9 cm opacity in the right breast, inferolaterally, not well characterized on the current exam. There is additional smaller opacity in the retroareolar region. Please refer to prior mammogram for details. The visualized soft tissues of the chest wall are grossly unremarkable. No suspicious osseous lesions. There are mild to moderate multilevel degenerative changes in the visualized spine. Review of the MIP images confirms the above findings. CTA ABDOMEN AND PELVIS FINDINGS VASCULAR Aorta: Normal caliber aorta without  aneurysm, dissection, vasculitis or significant stenosis. Celiac: Patent without evidence of aneurysm, dissection, vasculitis or significant stenosis. SMA: Patent without evidence of aneurysm, dissection, vasculitis or significant stenosis. Renals: Both renal arteries are patent without evidence of aneurysm, dissection, vasculitis, fibromuscular dysplasia or significant stenosis. IMA: Patent without evidence of aneurysm, dissection, vasculitis or significant stenosis. Inflow: Patent without evidence of aneurysm, dissection, vasculitis or significant stenosis. Veins: No obvious venous abnormality within the limitations of this arterial phase study. Review of the MIP images confirms the above findings. NON-VASCULAR Hepatobiliary: The liver is normal in size. There is subtle irregularity/nodularity of the liver surface, nonspecific. No frank cirrhotic configuration. Correlate clinically and with liver function tests to exclude liver parenchymal disease. No suspicious mass. No intrahepatic or extrahepatic bile duct dilation. No calcified gallstones. Normal gallbladder wall thickness. No pericholecystic inflammatory changes. Pancreas: Unremarkable. No pancreatic ductal dilatation or surrounding inflammatory changes. Spleen: Within normal limits. No focal lesion. Adrenals/Urinary Tract: Adrenal glands are unremarkable. No suspicious renal mass. No hydronephrosis. No renal or ureteric calculi. Unremarkable urinary bladder. Stomach/Bowel: There is a small sliding hiatal hernia. There is a small diverticulum arising from the second part of duodenum. No disproportionate dilation of the small or large bowel loops. No evidence of abnormal bowel wall thickening or inflammatory changes. The appendix is surgically absent. Vascular/Lymphatic: No ascites or pneumoperitoneum. No abdominal or pelvic lymphadenopathy, by size criteria. No aneurysmal dilation of the major abdominal arteries. There are mild peripheral atherosclerotic  vascular calcifications of the aorta and its major branches. Reproductive: The uterus is surgically absent. No large adnexal mass. Other: There is a tiny fat containing umbilical hernia. The soft tissues and abdominal wall are otherwise unremarkable. Musculoskeletal: No suspicious osseous lesions. There are mild - moderate multilevel degenerative changes in the visualized spine. Review of the MIP images confirms the above findings. IMPRESSION: 1. No acute aortic syndrome. No acute thoracic aortic intramural hematoma. No thoracoabdominal aortic aneurysm, dissection or penetrating atherosclerotic ulcer. 2. No acute inflammatory process identified within the abdomen or pelvis. No bowel obstruction. 3. Well-circumscribed opacity in the right breast, not well evaluated on the current exam. Please refer to prior mammogram for details. 4. Multiple other nonacute observations, as described above. Aortic Atherosclerosis (ICD10-I70.0). Electronically Signed   By: Jules Schick M.D.   On: 09/24/2023 10:47    Pertinent labs & imaging results that were available during my care of the patient were reviewed by me and considered in my medical decision making (see MDM for details).  Medications Ordered in ED Medications  insulin aspart (novoLOG) injection 0-9 Units (has no administration in time range)  sodium chloride 0.9 % bolus 1,000 mL (0 mLs Intravenous Stopped 09/24/23 1413)  ondansetron (ZOFRAN) injection 4 mg (4 mg Intravenous Given 09/24/23 1107)  morphine (PF) 4 MG/ML injection 4 mg (4 mg Intravenous Given 09/24/23 1107)  iohexol (OMNIPAQUE) 350 MG/ML injection 75 mL (75 mLs Intravenous Contrast Given 09/24/23 1013)  labetalol (NORMODYNE) injection 10 mg (10 mg Intravenous Given 09/24/23 1326)                                                                                                                                     Procedures Procedures  (including critical care time)  Medical Decision Making / ED  Course   MDM:  78 year old female presenting to the emergency department with abdominal pain.  Patient well-appearing, no acute distress.  Does have right upper quadrant tenderness on exam though slight.  Differential includes obstruction, perforation, cholecystitis, pancreatitis, volvulus, abscess, diverticulitis, colitis, gastroenteritis.  Patient was hypertensive in triage and triage provider ordered CT angiography given radiation of the back to evaluate for dissection, lower concern for this but given radiation of the back and hypertension we will proceed with CT angiography chest abdomen pelvis to further evaluate which will also evaluate for other intra-abdominal pathology.  Labs notable for mild hyponatremia, leukocytosis.  LFTs normal.  Very minimal elevation in troponin likely in the setting of AKI hypertension  Clinical Course as of 09/24/23 1511  Fri Sep 24, 2023  1509 CT scan showed known breast mass, discussed with patient and daughter this was a known finding.  Ordered right upper quadrant ultrasound given no clear cause of symptoms, did show some slight gallbladder wall thickening and sludge.  Not sure if symptoms would truly represent cholecystitis but does have white count.  Discussed with surgery recommend HIDA scan,  they will consult.  Discussed with hospitalist Dr. Artis Flock who will admit the patient. [WS]    Clinical Course User Index [WS] Lonell Grandchild, MD     Additional history obtained: -Additional history obtained from family -External records from outside source obtained and reviewed including: Chart review including previous notes, labs, imaging, consultation notes including prior notes    Lab Tests: -I ordered, reviewed, and interpreted labs.   The pertinent results include:   Labs Reviewed  COMPREHENSIVE METABOLIC PANEL - Abnormal; Notable for the following components:      Result Value   Sodium 127 (*)    Chloride 90 (*)    Glucose, Bld 215 (*)     Creatinine, Ser 1.20 (*)    GFR, Estimated 47 (*)    All other components within normal limits  CBC - Abnormal; Notable for the following components:   WBC 14.4 (*)    All other components within normal limits  URINALYSIS, ROUTINE W REFLEX MICROSCOPIC - Abnormal; Notable for the following components:   Glucose, UA 50 (*)    Hgb urine dipstick SMALL (*)    Protein, ur >=300 (*)    All other components within normal limits  I-STAT CHEM 8, ED - Abnormal; Notable for the following components:   Sodium 128 (*)    Chloride 91 (*)    Creatinine, Ser 1.10 (*)    Glucose, Bld 216 (*)    Calcium, Ion 1.13 (*)    All other components within normal limits  TROPONIN I (HIGH SENSITIVITY) - Abnormal; Notable for the following components:   Troponin I (High Sensitivity) 23 (*)    All other components within normal limits  TROPONIN I (HIGH SENSITIVITY) - Abnormal; Notable for the following components:   Troponin I (High Sensitivity) 22 (*)    All other components within normal limits  LIPASE, BLOOD  HEMOGLOBIN A1C    Notable for hyponatremia, AKI  EKG   EKG Interpretation Date/Time:  Friday September 24 2023 06:52:50 EST Ventricular Rate:  94 PR Interval:  166 QRS Duration:  86 QT Interval:  386 QTC Calculation: 482 R Axis:   20  Text Interpretation: Normal sinus rhythm Anterior infarct , age undetermined Abnormal ECG When compared with ECG of 15-Jul-2018 10:14, No significant change since last tracing Confirmed by Alvino Blood ([id number]) on 09/24/2023 9:28:33 AM         Imaging Studies ordered: I ordered imaging studies including CT, RUQ [place]  On my interpretation imaging demonstrates ?gallbladder wall thickening I independently visualized and interpreted imaging. I agree with the radiologist interpretation   Medicines ordered and prescription drug management: Meds ordered this encounter  Medications   sodium chloride 0.9 % bolus 1,000 mL   ondansetron (ZOFRAN) injection 4 mg    morphine (PF) 4 MG/ML injection 4 mg   iohexol (OMNIPAQUE) 350 MG/ML injection 75 mL   labetalol (NORMODYNE) injection 10 mg   insulin aspart (novoLOG) injection 0-9 Units    Correction coverage::   Sensitive (thin, NPO, renal)    CBG < 70::   Implement Hypoglycemia Standing Orders and refer to Hypoglycemia Standing Orders sidebar report    CBG 70 - 120::   0 units    CBG 121 - 150::   1 unit    CBG 151 - 200::   2 units    CBG 201 - 250::   3 units    CBG 251 - 300::   5 units    CBG 301 -  350::   7 units    CBG 351 - 400:   9 units    CBG > 400:   call MD and obtain STAT lab verification    -I have reviewed the patients home medicines and have made adjustments as needed   Consultations Obtained: I requested consultation with the general surgeon,  and discussed lab and imaging findings as well as pertinent plan - they recommend: admission   Cardiac Monitoring: The patient was maintained on a cardiac monitor.  I personally viewed and interpreted the cardiac monitored which showed an underlying rhythm of: NSR  Social Determinants of Health:  Diagnosis or treatment significantly limited by social determinants of health: obesity   Reevaluation: After the interventions noted above, I reevaluated the patient and found that their symptoms have improved  Co morbidities that complicate the patient evaluation  Past Medical History:  Diagnosis Date   Cancer (HCC)    GERD (gastroesophageal reflux disease)    History of radiation therapy 01/16/19- 02/13/19   left breast 15 fractions of 2.67 Gy for a total of 40.05 Gy. Left breast boost 5 fractions of 2 Gy for a total of 10 Gy   Hypertension    Mixed hyperlipidemia    OA (osteoarthritis)    knee right   Peripheral neuropathy    PVD (peripheral vascular disease) (HCC)    Type 2 diabetes mellitus treated with insulin [facility])    endocrinologist--- dr Armen Pickup [place]      Dispostion: Disposition decision including need for  hospitalization was considered, and patient admitted to the hospital.    Final Clinical Impression(s) / ED Diagnoses Final diagnoses:  RUQ abdominal pain     This chart was dictated using voice recognition software.  Despite best efforts to proofread,  errors can occur which can change the documentation meaning.    Lonell Grandchild, MD 09/24/23 [phone]

## 2023-09-25 ENCOUNTER — Observation Stay (HOSPITAL_COMMUNITY): Payer: Medicare Other

## 2023-09-25 DIAGNOSIS — K810 Acute cholecystitis: Secondary | ICD-10-CM | POA: Diagnosis present

## 2023-09-25 DIAGNOSIS — K219 Gastro-esophageal reflux disease without esophagitis: Secondary | ICD-10-CM | POA: Diagnosis present

## 2023-09-25 DIAGNOSIS — R1011 Right upper quadrant pain: Secondary | ICD-10-CM | POA: Diagnosis present

## 2023-09-25 DIAGNOSIS — Z8249 Family history of ischemic heart disease and other diseases of the circulatory system: Secondary | ICD-10-CM | POA: Diagnosis not present

## 2023-09-25 DIAGNOSIS — E860 Dehydration: Secondary | ICD-10-CM | POA: Diagnosis present

## 2023-09-25 DIAGNOSIS — Z91040 Latex allergy status: Secondary | ICD-10-CM | POA: Diagnosis not present

## 2023-09-25 DIAGNOSIS — R809 Proteinuria, unspecified: Secondary | ICD-10-CM | POA: Diagnosis present

## 2023-09-25 DIAGNOSIS — Z923 Personal history of irradiation: Secondary | ICD-10-CM | POA: Diagnosis not present

## 2023-09-25 DIAGNOSIS — E782 Mixed hyperlipidemia: Secondary | ICD-10-CM | POA: Diagnosis present

## 2023-09-25 DIAGNOSIS — Z79811 Long term (current) use of aromatase inhibitors: Secondary | ICD-10-CM | POA: Diagnosis not present

## 2023-09-25 DIAGNOSIS — M3500 Sicca syndrome, unspecified: Secondary | ICD-10-CM | POA: Diagnosis present

## 2023-09-25 DIAGNOSIS — N182 Chronic kidney disease, stage 2 (mild): Secondary | ICD-10-CM | POA: Diagnosis present

## 2023-09-25 DIAGNOSIS — Z7984 Long term (current) use of oral hypoglycemic drugs: Secondary | ICD-10-CM | POA: Diagnosis not present

## 2023-09-25 DIAGNOSIS — C50212 Malignant neoplasm of upper-inner quadrant of left female breast: Secondary | ICD-10-CM | POA: Diagnosis present

## 2023-09-25 DIAGNOSIS — E119 Type 2 diabetes mellitus without complications: Secondary | ICD-10-CM | POA: Diagnosis not present

## 2023-09-25 DIAGNOSIS — Z833 Family history of diabetes mellitus: Secondary | ICD-10-CM | POA: Diagnosis not present

## 2023-09-25 DIAGNOSIS — Z880 Allergy status to penicillin: Secondary | ICD-10-CM | POA: Diagnosis not present

## 2023-09-25 DIAGNOSIS — R1010 Upper abdominal pain, unspecified: Secondary | ICD-10-CM | POA: Diagnosis not present

## 2023-09-25 DIAGNOSIS — Z9221 Personal history of antineoplastic chemotherapy: Secondary | ICD-10-CM | POA: Diagnosis not present

## 2023-09-25 DIAGNOSIS — Z6831 Body mass index (BMI) 31.0-31.9, adult: Secondary | ICD-10-CM | POA: Diagnosis not present

## 2023-09-25 DIAGNOSIS — I160 Hypertensive urgency: Secondary | ICD-10-CM | POA: Diagnosis present

## 2023-09-25 DIAGNOSIS — E871 Hypo-osmolality and hyponatremia: Secondary | ICD-10-CM | POA: Diagnosis present

## 2023-09-25 DIAGNOSIS — E1142 Type 2 diabetes mellitus with diabetic polyneuropathy: Secondary | ICD-10-CM | POA: Diagnosis present

## 2023-09-25 DIAGNOSIS — E861 Hypovolemia: Secondary | ICD-10-CM | POA: Diagnosis present

## 2023-09-25 DIAGNOSIS — E1151 Type 2 diabetes mellitus with diabetic peripheral angiopathy without gangrene: Secondary | ICD-10-CM | POA: Diagnosis present

## 2023-09-25 DIAGNOSIS — K819 Cholecystitis, unspecified: Secondary | ICD-10-CM | POA: Diagnosis not present

## 2023-09-25 DIAGNOSIS — I10 Essential (primary) hypertension: Secondary | ICD-10-CM | POA: Diagnosis not present

## 2023-09-25 DIAGNOSIS — I129 Hypertensive chronic kidney disease with stage 1 through stage 4 chronic kidney disease, or unspecified chronic kidney disease: Secondary | ICD-10-CM | POA: Diagnosis present

## 2023-09-25 DIAGNOSIS — E1122 Type 2 diabetes mellitus with diabetic chronic kidney disease: Secondary | ICD-10-CM | POA: Diagnosis present

## 2023-09-25 LAB — HEMOGLOBIN A1C
Hgb A1c MFr Bld: 7.3 % — ABNORMAL HIGH (ref 4.8–5.6)
Mean Plasma Glucose: 162.81 mg/dL

## 2023-09-25 LAB — CBC
HCT: 31.9 % — ABNORMAL LOW (ref 36.0–46.0)
Hemoglobin: 10.7 g/dL — ABNORMAL LOW (ref 12.0–15.0)
MCH: 27.5 pg (ref 26.0–34.0)
MCHC: 33.5 g/dL (ref 30.0–36.0)
MCV: 82 fL (ref 80.0–100.0)
Platelets: 264 10*3/uL (ref 150–400)
RBC: 3.89 MIL/uL (ref 3.87–5.11)
RDW: 13 % (ref 11.5–15.5)
WBC: 9.2 10*3/uL (ref 4.0–10.5)
nRBC: 0 % (ref 0.0–0.2)

## 2023-09-25 LAB — COMPREHENSIVE METABOLIC PANEL
ALT: 13 U/L (ref 0–44)
AST: 15 U/L (ref 15–41)
Albumin: 3 g/dL — ABNORMAL LOW (ref 3.5–5.0)
Alkaline Phosphatase: 51 U/L (ref 38–126)
Anion gap: 11 (ref 5–15)
BUN: 13 mg/dL (ref 8–23)
CO2: 23 mmol/L (ref 22–32)
Calcium: 9.5 mg/dL (ref 8.9–10.3)
Chloride: 100 mmol/L (ref 98–111)
Creatinine, Ser: 1.05 mg/dL — ABNORMAL HIGH (ref 0.44–1.00)
GFR, Estimated: 55 mL/min — ABNORMAL LOW (ref >60)
Glucose, Bld: 135 mg/dL — ABNORMAL HIGH (ref 70–99)
Potassium: 3.7 mmol/L (ref 3.5–5.1)
Sodium: 134 mmol/L — ABNORMAL LOW (ref 135–145)
Total Bilirubin: 0.6 mg/dL (ref 0.0–1.2)
Total Protein: 6.2 g/dL — ABNORMAL LOW (ref 6.5–8.1)

## 2023-09-25 LAB — GLUCOSE, CAPILLARY
Glucose-Capillary: 136 mg/dL — ABNORMAL HIGH (ref 70–99)
Glucose-Capillary: 117 mg/dL — ABNORMAL HIGH (ref 70–99)
Glucose-Capillary: 204 mg/dL — ABNORMAL HIGH (ref 70–99)
Glucose-Capillary: 168 mg/dL — ABNORMAL HIGH (ref 70–99)

## 2023-09-25 LAB — MRSA NEXT GEN BY PCR, NASAL: MRSA by PCR Next Gen: NOT DETECTED

## 2023-09-25 MED ORDER — CHLORHEXIDINE GLUCONATE CLOTH 2 % EX PADS: 6.0000 | MEDICATED_PAD | Freq: Once | CUTANEOUS | Status: AC

## 2023-09-25 MED ORDER — HYDRALAZINE HCL 25 MG PO TABS: 25.0000 mg | ORAL_TABLET | Freq: Four times a day (QID) | ORAL | Status: DC | PRN

## 2023-09-25 MED ORDER — CHLORHEXIDINE GLUCONATE CLOTH 2 % EX PADS
6.0000 | MEDICATED_PAD | Freq: Once | CUTANEOUS | Status: AC
  Administered 2023-09-26: 6 via TOPICAL

## 2023-09-25 MED ORDER — AMLODIPINE BESYLATE 10 MG PO TABS: 10.0000 mg | ORAL_TABLET | Freq: Every day | ORAL | Status: DC

## 2023-09-25 MED ORDER — AMLODIPINE BESYLATE 5 MG PO TABS: 5.0000 mg | ORAL_TABLET | Freq: Once | ORAL | Status: DC

## 2023-09-25 MED ORDER — AMLODIPINE BESYLATE 10 MG PO TABS
10.0000 mg | ORAL_TABLET | Freq: Every day | ORAL | Status: DC
  Administered 2023-09-26 – 2023-09-27 (×2): 10 mg via ORAL
  Filled 2023-09-25 (×2): qty 1

## 2023-09-25 MED ORDER — CIPROFLOXACIN IN D5W 400 MG/200ML IV SOLN
400.0000 mg | Freq: Two times a day (BID) | INTRAVENOUS | Status: DC
  Administered 2023-09-25 – 2023-09-26 (×2): 400 mg via INTRAVENOUS
  Filled 2023-09-25 (×3): qty 200

## 2023-09-25 MED ORDER — PANTOPRAZOLE SODIUM 40 MG PO TBEC
80.0000 mg | DELAYED_RELEASE_TABLET | Freq: Every day | ORAL | Status: DC
  Administered 2023-09-26 – 2023-09-27 (×2): 80 mg via ORAL
  Filled 2023-09-25 (×2): qty 2

## 2023-09-25 MED ORDER — IRBESARTAN 300 MG PO TABS: 300.0000 mg | ORAL_TABLET | Freq: Every day | ORAL | Status: DC

## 2023-09-25 MED ORDER — TECHNETIUM TC 99M MEBROFENIN IV KIT
5.1000 | PACK | Freq: Once | INTRAVENOUS | Status: AC | PRN
  Administered 2023-09-25: 5.1 via INTRAVENOUS

## 2023-09-25 MED ORDER — SIMVASTATIN 20 MG PO TABS
40.0000 mg | ORAL_TABLET | Freq: Every day | ORAL | Status: DC
  Administered 2023-09-26: 40 mg via ORAL
  Filled 2023-09-25 (×2): qty 2

## 2023-09-25 MED ORDER — CARVEDILOL 3.125 MG PO TABS
3.1250 mg | ORAL_TABLET | Freq: Two times a day (BID) | ORAL | Status: DC
  Administered 2023-09-25 – 2023-09-27 (×4): 3.125 mg via ORAL
  Filled 2023-09-25 (×4): qty 1

## 2023-09-25 MED ORDER — MORPHINE SULFATE (PF) 4 MG/ML IV SOLN
3.0000 mg | Freq: Once | INTRAVENOUS | Status: AC
  Administered 2023-09-25: 3 mg via INTRAVENOUS
  Filled 2023-09-25: qty 1

## 2023-09-25 MED ORDER — ANASTROZOLE 1 MG PO TABS
1.0000 mg | ORAL_TABLET | Freq: Every day | ORAL | Status: DC
  Administered 2023-09-26 – 2023-09-27 (×2): 1 mg via ORAL
  Filled 2023-09-25 (×3): qty 1

## 2023-09-25 NOTE — Progress Notes (Signed)
 Gloria Mckinney  ZOX:[id number] DOB: 09/27/1945 DOA: 09/24/2023 PCP: Mila Palmer, MD    Brief Narrative:  78 year old with a history of HTN, HLD, DM2, PVD, and breast cancer status post left breast lumpectomy and chemo/XRT who presented to the ER 2/14 with right upper quadrant abdominal pain and nausea without vomiting.  In the ER CT abdomen revealed no acute inflammatory process within the abdomen or pelvis.  Ultrasound of the abdomen noted slight wall thickening of the gallbladder with some sludge but no obvious stones or ductal dilatation.  General surgery was consulted and suggested a HIDA scan.  Goals of Care:   Code Status: Full Code   DVT prophylaxis: SCDs Start: 09/24/23 1547   Interim Hx: Afebrile.  Blood pressure elevated with systolics 152-164.  Vital signs otherwise stable.  CBG reasonably controlled.  Feeling better overall at the time of visit.  Sitting up at bedside.  Abdominal pain is improved.  He is feeling somewhat hungry.  No shortness of breath or chest pain.  Assessment & Plan:  Right upper quadrant abdominal pain CT abdomen unrevealing but ultrasound suggest gallbladder sludge -awaiting HIDA scan  Hyponatremia Felt to be related to hypovolemia as well as use of diuretic -monitor with volume expansion  Uncontrolled hypertension Adjust medical therapy and monitor trend  DM2 A1c 7.3 -on Ozempic Amaryl metformin and insulin but reported that she had not taken her insulin in over a month -CBG currently well-controlled  CKD stage II Baseline creatinine 1.2 -creatinine stable at baseline presently  HLD Continue Zocor  Abnormal imaging of right breast Incidentally noted on CT scanning of abdomen with mammogram February 2024 revealing no acute findings -due for repeat mammogram 10/07/2023  Left breast cancer Status postlumpectomy with chemotherapy and XRT -continue daily Arimidex  Proteinuria Incidentally noted on UA at time of admission -will  need to be followed up in the outpatient setting  Family Communication: Spoke with daughter at bedside Disposition: Will depend upon results of HIDA scan and if general surgery feels surgical intervention is necessary   Objective: Blood pressure (!) 164/78, pulse 89, temperature 98.5 F (36.9 C), temperature source Oral, resp. rate 16, height 5\' 2"  (1.575 m), weight 77.1 kg, SpO2 99%.  Intake/Output Summary (Last 24 hours) at 09/25/2023 0900 Last data filed at 09/25/2023 0400 Gross per 24 hour  Intake 1397.02 ml  Output --  Net 1397.02 ml   Filed Weights   09/24/23 0649  Weight: 77.1 kg    Examination: General: No acute respiratory distress Lungs: Clear to auscultation bilaterally without wheezes or crackles Cardiovascular: Regular rate and rhythm without murmur gallop or rub normal S1 and S2 Abdomen: Nontender, nondistended, soft, bowel sounds positive, no rebound, no ascites, no appreciable mass Extremities: No significant cyanosis, clubbing, or edema bilateral lower extremities  CBC: Recent Labs  Lab 09/24/23 0653 09/24/23 0728 09/25/23 0702  WBC 14.4*  --  9.2  HGB 12.3 13.6 10.7*  HCT 36.1 40.0 31.9*  MCV 80.9  --  82.0  PLT 296  --  264   Basic Metabolic Panel: Recent Labs  Lab 09/24/23 0653 09/24/23 0728 09/25/23 0702  NA 127* 128* 134*  K 4.1 4.0 3.7  CL 90* 91* 100  CO2 23  --  23  GLUCOSE 215* 216* 135*  BUN 17 19 13   CREATININE 1.20* 1.10* 1.05*  CALCIUM 9.6  --  9.5   GFR: Estimated Creatinine Clearance: 44.5 mL/min (A) (by C-G formula based on SCr of 1.05 mg/dL (H)).  Scheduled Meds:  amLODipine  5 mg Oral Daily   anastrozole  1 mg Oral Daily   insulin aspart  0-9 Units Subcutaneous TID WC   irbesartan  300 mg Oral Daily   pantoprazole  80 mg Oral Daily   simvastatin  40 mg Oral q1800   Continuous Infusions:  sodium chloride 75 mL/hr at 09/24/23 2119     LOS: 0 days   Lonia Blood, MD [facility] Office   [phone] Pager - Text Page per Loretha Stapler  If 7PM-7AM, please contact night-coverage per Amion 09/25/2023, 9:00 AM

## 2023-09-25 NOTE — Progress Notes (Signed)
 [facility] Progress Note     Subjective: CC:  HA overnight that has now resolved. Denies abdominal pain.  Objective: Vital signs in last 24 hours: Temp:  [97.7 F (36.5 C)-99.1 F (37.3 C)] 98.5 F (36.9 C) (02/15 0724) Pulse Rate:  [72-99] 89 (02/15 0724) Resp:  [15-23] 16 (02/15 0724) BP: (149-218)/(60-98) 164/78 (02/15 0724) SpO2:  [95 %-100 %] 99 % (02/15 0724) Last BM Date : 09/23/23  Intake/Output from previous day: 02/14 0701 - 02/15 0700 In: 1397 [I.V.:397; IV Piggyback:1000] Out: -  Intake/Output this shift: No intake/output data recorded.  PE: Gen:  Alert, NAD, pleasant Card:  Regular rate and rhythm, no lower extremity edema  Pulm:  Normal effort Abd: Soft, non-tender, non-distended Skin: warm and dry, no rashes  Psych: A&Ox3   Lab Results:  Recent Labs    09/24/23 0653 09/24/23 0728 09/25/23 0702  WBC 14.4*  --  9.2  HGB 12.3 13.6 10.7*  HCT 36.1 40.0 31.9*  PLT 296  --  264   BMET Recent Labs    09/24/23 0653 09/24/23 0728 09/25/23 0702  NA 127* 128* 134*  K 4.1 4.0 3.7  CL 90* 91* 100  CO2 23  --  23  GLUCOSE 215* 216* 135*  BUN 17 19 13   CREATININE 1.20* 1.10* 1.05*  CALCIUM 9.6  --  9.5   PT/INR No results for input(s): "LABPROT", "INR" in the last 72 hours. CMP     Component Value Date/Time   NA 134 (L) 09/25/2023 0702   K 3.7 09/25/2023 0702   CL 100 09/25/2023 0702   CO2 23 09/25/2023 0702   GLUCOSE 135 (H) 09/25/2023 0702   BUN 13 09/25/2023 0702   CREATININE 1.05 (H) 09/25/2023 0702   CREATININE 0.92 08/03/2019 0942   CALCIUM 9.5 09/25/2023 0702   PROT 6.2 (L) 09/25/2023 0702   ALBUMIN 3.0 (L) 09/25/2023 0702   AST 15 09/25/2023 0702   AST 16 08/03/2019 0942   ALT 13 09/25/2023 0702   ALT 22 08/03/2019 0942   ALKPHOS 51 09/25/2023 0702   BILITOT 0.6 09/25/2023 0702   BILITOT 0.3 08/03/2019 0942   GFRNONAA 55 (L) 09/25/2023 0702   GFRNONAA >60 08/03/2019 0942   GFRAA >60 08/03/2019 0942   Lipase      Component Value Date/Time   LIPASE 39 09/24/2023 0653       Studies/Results: US Abdomen Limited RUQ (LIVER/GB) Result Date: 09/24/2023 CLINICAL DATA:  Right upper quadrant abdominal pain EXAM: ULTRASOUND ABDOMEN LIMITED RIGHT UPPER QUADRANT COMPARISON:  Ultrasound 12/02/2022.  CT 09/24/2023 earlier. FINDINGS: Gallbladder: Distended gallbladder. Some layering sludge suggested. There is also some wall thickening. No shadowing stones. No reported sonographic Murphy's sign. Common bile duct: Diameter: 9 mm, at the upper limits normal for patient's age Liver: No focal lesion identified. Within normal limits in parenchymal echogenicity. Portal vein is patent on color Doppler imaging with normal direction of blood flow towards the liver. Other: None. IMPRESSION: Slight wall thickening of the gallbladder with some sludge. No obvious stones or ductal dilatation. Please correlate with symptoms and recommend further workup based on the findings by CT scan such as pre and postcontrast MRI. Aggressive process is in the differential. Electronically Signed   By: Karen Kays M.D.   On: 09/24/2023 13:09   CT Angio Chest/Abd/Pel for Dissection W and/or Wo Contrast Result Date: 09/24/2023 CLINICAL DATA:  Acute aortic syndrome (AAS) suspected. Abdominal pain. EXAM: CT ANGIOGRAPHY CHEST, ABDOMEN AND PELVIS TECHNIQUE: Non-contrast CT  of the chest was initially obtained. Multidetector CT imaging through the chest, abdomen and pelvis was performed using the standard protocol during bolus administration of intravenous contrast. Multiplanar reconstructed images and MIPs were obtained and reviewed to evaluate the vascular anatomy. RADIATION DOSE REDUCTION: This exam was performed according to the departmental dose-optimization program which includes automated exposure control, adjustment of the mA and/or kV according to patient size and/or use of iterative reconstruction technique. CONTRAST:  75mL OMNIPAQUE IOHEXOL 350  MG/ML SOLN COMPARISON:  CT scan abdomen and pelvis from 12/02/2022 FINDINGS: CTA CHEST FINDINGS Cardiovascular: No intramural hematoma noted in the thoracic aorta on the unenhanced images. Thoracic aorta is normal in caliber without aneurysm, dissection, vasculitis or significant stenosis. Normal cardiac size. No pericardial effusion. No aortic aneurysm. There are coronary artery calcifications, in keeping with coronary artery disease. There are also mild-to-moderate peripheral atherosclerotic vascular calcifications of thoracic aorta and its major branches. Mediastinum/Nodes: Visualized thyroid gland appears grossly unremarkable. No solid / cystic mediastinal masses. The esophagus is nondistended precluding optimal assessment. No axillary, mediastinal or hilar lymphadenopathy by size criteria. Lungs/Pleura: The central tracheo-bronchial tree is patent. There are dependent changes in bilateral lungs. No mass or consolidation. No pleural effusion or pneumothorax. No suspicious lung nodules. Musculoskeletal: Focal scarring and surgical staples noted in the medial left breast. There is a well-circumscribed proximally 2.1 x 2.9 cm opacity in the right breast, inferolaterally, not well characterized on the current exam. There is additional smaller opacity in the retroareolar region. Please refer to prior mammogram for details. The visualized soft tissues of the chest wall are grossly unremarkable. No suspicious osseous lesions. There are mild to moderate multilevel degenerative changes in the visualized spine. Review of the MIP images confirms the above findings. CTA ABDOMEN AND PELVIS FINDINGS VASCULAR Aorta: Normal caliber aorta without aneurysm, dissection, vasculitis or significant stenosis. Celiac: Patent without evidence of aneurysm, dissection, vasculitis or significant stenosis. SMA: Patent without evidence of aneurysm, dissection, vasculitis or significant stenosis. Renals: Both renal arteries are patent without  evidence of aneurysm, dissection, vasculitis, fibromuscular dysplasia or significant stenosis. IMA: Patent without evidence of aneurysm, dissection, vasculitis or significant stenosis. Inflow: Patent without evidence of aneurysm, dissection, vasculitis or significant stenosis. Veins: No obvious venous abnormality within the limitations of this arterial phase study. Review of the MIP images confirms the above findings. NON-VASCULAR Hepatobiliary: The liver is normal in size. There is subtle irregularity/nodularity of the liver surface, nonspecific. No frank cirrhotic configuration. Correlate clinically and with liver function tests to exclude liver parenchymal disease. No suspicious mass. No intrahepatic or extrahepatic bile duct dilation. No calcified gallstones. Normal gallbladder wall thickness. No pericholecystic inflammatory changes. Pancreas: Unremarkable. No pancreatic ductal dilatation or surrounding inflammatory changes. Spleen: Within normal limits. No focal lesion. Adrenals/Urinary Tract: Adrenal glands are unremarkable. No suspicious renal mass. No hydronephrosis. No renal or ureteric calculi. Unremarkable urinary bladder. Stomach/Bowel: There is a small sliding hiatal hernia. There is a small diverticulum arising from the second part of duodenum. No disproportionate dilation of the small or large bowel loops. No evidence of abnormal bowel wall thickening or inflammatory changes. The appendix is surgically absent. Vascular/Lymphatic: No ascites or pneumoperitoneum. No abdominal or pelvic lymphadenopathy, by size criteria. No aneurysmal dilation of the major abdominal arteries. There are mild peripheral atherosclerotic vascular calcifications of the aorta and its major branches. Reproductive: The uterus is surgically absent. No large adnexal mass. Other: There is a tiny fat containing umbilical hernia. The soft tissues and abdominal wall are otherwise unremarkable.  Musculoskeletal: No suspicious osseous  lesions. There are mild - moderate multilevel degenerative changes in the visualized spine. Review of the MIP images confirms the above findings. IMPRESSION: 1. No acute aortic syndrome. No acute thoracic aortic intramural hematoma. No thoracoabdominal aortic aneurysm, dissection or penetrating atherosclerotic ulcer. 2. No acute inflammatory process identified within the abdomen or pelvis. No bowel obstruction. 3. Well-circumscribed opacity in the right breast, not well evaluated on the current exam. Please refer to prior mammogram for details. 4. Multiple other nonacute observations, as described above. Aortic Atherosclerosis (ICD10-I70.0). Electronically Signed   By: Jules Schick M.D.   On: 09/24/2023 10:47    Anti-infectives: Anti-infectives (From admission, onward)    None      Assessment/Plan RUQ pain, R/o acalculous cholecystitis  - no pain this morning. Leukocytosis resolved without abx, LFTs WNL - CTA negative for gallstones or gallbladder wall thickening, no pericholecystic inflammatory changes, subtle nodularity of liver surface - [place] with sludge but no stones, slight wall thickening, no sonographic murphy sign  - HIDA pending, we will follow results and make additional recommendations  NPO for HIDA ID: no abx  VTE: SCD's, ok for chemical VTE    LOS: 0 days   I reviewed nursing notes, hospitalist notes, last 24 h vitals and pain scores, last 48 h intake and output, last 24 h labs and trends, and last 24 h imaging results.  This care required moderate level of medical decision making.   Hosie Spangle, PA-C [facility] Please see Amion for pager number during day hours 7:00am-4:30pm

## 2023-09-25 NOTE — Progress Notes (Signed)
 Patient is hypertensive and is ordered to be kept on telemetry but the telemetry box is not available in the unit and waiting for it.Also patient is complaining severe headache but instructed not to give pain meds due to her NM hepatobiliary LFT ,informed the doctor about it

## 2023-09-25 NOTE — Care Management Obs Status (Signed)
 MEDICARE OBSERVATION STATUS NOTIFICATION   Patient Details  Name: Gloria Mckinney MRN: [id number] Date of Birth: September 27, 1945   Medicare Observation Status Notification Given:  Yes    Ronny Bacon, RN 09/25/2023, 8:52 AM

## 2023-09-26 ENCOUNTER — Encounter (HOSPITAL_COMMUNITY): Payer: Self-pay | Admitting: Internal Medicine

## 2023-09-26 ENCOUNTER — Other Ambulatory Visit: Payer: Self-pay

## 2023-09-26 ENCOUNTER — Encounter (HOSPITAL_COMMUNITY)
Admission: EM | Disposition: A | Discharge status: Home / Self Care | Payer: Self-pay | Source: Home / Self Care | Attending: Internal Medicine

## 2023-09-26 ENCOUNTER — Inpatient Hospital Stay (HOSPITAL_COMMUNITY): Payer: Medicare Other | Admitting: Certified Registered Nurse Anesthetist

## 2023-09-26 DIAGNOSIS — I10 Essential (primary) hypertension: Secondary | ICD-10-CM | POA: Diagnosis not present

## 2023-09-26 DIAGNOSIS — R1011 Right upper quadrant pain: Secondary | ICD-10-CM

## 2023-09-26 DIAGNOSIS — E119 Type 2 diabetes mellitus without complications: Secondary | ICD-10-CM | POA: Diagnosis not present

## 2023-09-26 DIAGNOSIS — K819 Cholecystitis, unspecified: Secondary | ICD-10-CM | POA: Diagnosis not present

## 2023-09-26 HISTORY — PX: CHOLECYSTECTOMY: SHX55

## 2023-09-26 LAB — CBC
HCT: 29.9 % — ABNORMAL LOW (ref 36.0–46.0)
Hemoglobin: 10.3 g/dL — ABNORMAL LOW (ref 12.0–15.0)
MCH: 28.2 pg (ref 26.0–34.0)
MCHC: 34.4 g/dL (ref 30.0–36.0)
MCV: 81.9 fL (ref 80.0–100.0)
Platelets: 247 10*3/uL (ref 150–400)
RBC: 3.65 MIL/uL — ABNORMAL LOW (ref 3.87–5.11)
RDW: 12.9 % (ref 11.5–15.5)
WBC: 7.6 10*3/uL (ref 4.0–10.5)
nRBC: 0 % (ref 0.0–0.2)

## 2023-09-26 LAB — COMPREHENSIVE METABOLIC PANEL
ALT: 13 U/L (ref 0–44)
AST: 14 U/L — ABNORMAL LOW (ref 15–41)
Albumin: 2.8 g/dL — ABNORMAL LOW (ref 3.5–5.0)
Alkaline Phosphatase: 49 U/L (ref 38–126)
Anion gap: 11 (ref 5–15)
BUN: 12 mg/dL (ref 8–23)
CO2: 23 mmol/L (ref 22–32)
Calcium: 9.5 mg/dL (ref 8.9–10.3)
Chloride: 100 mmol/L (ref 98–111)
Creatinine, Ser: 0.94 mg/dL (ref 0.44–1.00)
GFR, Estimated: >60 mL/min (ref >60)
Glucose, Bld: 157 mg/dL — ABNORMAL HIGH (ref 70–99)
Potassium: 4 mmol/L (ref 3.5–5.1)
Sodium: 134 mmol/L — ABNORMAL LOW (ref 135–145)
Total Bilirubin: 0.6 mg/dL (ref 0.0–1.2)
Total Protein: 6 g/dL — ABNORMAL LOW (ref 6.5–8.1)

## 2023-09-26 LAB — GLUCOSE, CAPILLARY
Glucose-Capillary: 146 mg/dL — ABNORMAL HIGH (ref 70–99)
Glucose-Capillary: 206 mg/dL — ABNORMAL HIGH (ref 70–99)
Glucose-Capillary: 191 mg/dL — ABNORMAL HIGH (ref 70–99)
Glucose-Capillary: 190 mg/dL — ABNORMAL HIGH (ref 70–99)
Glucose-Capillary: 233 mg/dL — ABNORMAL HIGH (ref 70–99)

## 2023-09-26 LAB — ANA W/REFLEX IF POSITIVE: Anti Nuclear Antibody (ANA): NEGATIVE

## 2023-09-26 SURGERY — LAPAROSCOPIC CHOLECYSTECTOMY
Anesthesia: General | Site: Abdomen

## 2023-09-26 MED ORDER — HEPARIN SODIUM (PORCINE) 5000 UNIT/ML IJ SOLN
5000.0000 [IU] | Freq: Once | INTRAMUSCULAR | Status: AC
  Administered 2023-09-26: 5000 [IU] via SUBCUTANEOUS
  Filled 2023-09-26: qty 1

## 2023-09-26 MED ORDER — HYDROCHLOROTHIAZIDE 12.5 MG PO TABS
12.5000 mg | ORAL_TABLET | Freq: Every day | ORAL | Status: DC
  Administered 2023-09-26 – 2023-09-27 (×2): 12.5 mg via ORAL
  Filled 2023-09-26 (×2): qty 1

## 2023-09-26 MED ORDER — ACETAMINOPHEN 10 MG/ML IV SOLN
INTRAVENOUS | Status: AC
  Filled 2023-09-26: qty 100

## 2023-09-26 MED ORDER — SODIUM CHLORIDE 0.9 % IR SOLN
Status: DC | PRN
  Administered 2023-09-26: 1

## 2023-09-26 MED ORDER — OXYCODONE HCL 5 MG PO TABS
2.5000 mg | ORAL_TABLET | ORAL | Status: DC | PRN
  Administered 2023-09-26 – 2023-09-27 (×3): 5 mg via ORAL
  Filled 2023-09-26 (×3): qty 1

## 2023-09-26 MED ORDER — BUPIVACAINE-EPINEPHRINE 0.25% -1:200000 IJ SOLN
INTRAMUSCULAR | Status: DC | PRN
  Administered 2023-09-26: 30 mL

## 2023-09-26 MED ORDER — FENTANYL CITRATE (PF) 250 MCG/5ML IJ SOLN
INTRAMUSCULAR | Status: AC
  Filled 2023-09-26: qty 5

## 2023-09-26 MED ORDER — ACETAMINOPHEN 10 MG/ML IV SOLN
INTRAVENOUS | Status: DC | PRN
  Administered 2023-09-26: 1000 mg via INTRAVENOUS

## 2023-09-26 MED ORDER — PHENYLEPHRINE HCL-NACL 20-0.9 MG/250ML-% IV SOLN
INTRAVENOUS | Status: DC | PRN
  Administered 2023-09-26: 40 ug/min via INTRAVENOUS

## 2023-09-26 MED ORDER — SUGAMMADEX SODIUM 200 MG/2ML IV SOLN
INTRAVENOUS | Status: DC | PRN
  Administered 2023-09-26: 200 mg via INTRAVENOUS

## 2023-09-26 MED ORDER — 0.9 % SODIUM CHLORIDE (POUR BTL) OPTIME
TOPICAL | Status: DC | PRN
  Administered 2023-09-26: 1000 mL

## 2023-09-26 MED ORDER — PROPOFOL 10 MG/ML IV BOLUS
INTRAVENOUS | Status: DC | PRN
Start: 2023-09-26 — End: 2023-09-26
  Administered 2023-09-26: 90 mg via INTRAVENOUS
  Administered 2023-09-26: 20 mg via INTRAVENOUS
  Administered 2023-09-26: 10 mg via INTRAVENOUS

## 2023-09-26 MED ORDER — LIDOCAINE 2% (20 MG/ML) 5 ML SYRINGE
INTRAMUSCULAR | Status: DC | PRN
Start: 2023-09-26 — End: 2023-09-26
  Administered 2023-09-26: 80 mg via INTRAVENOUS

## 2023-09-26 MED ORDER — INSULIN ASPART 100 UNIT/ML IJ SOLN
3.0000 [IU] | Freq: Once | INTRAMUSCULAR | Status: AC
  Administered 2023-09-26: 3 [IU] via SUBCUTANEOUS

## 2023-09-26 MED ORDER — VALSARTAN-HYDROCHLOROTHIAZIDE 320-12.5 MG PO TABS: 1.0000 | ORAL_TABLET | Freq: Every day | ORAL | Status: DC

## 2023-09-26 MED ORDER — ORAL CARE MOUTH RINSE: 15.0000 mL | Freq: Once | OROMUCOSAL | Status: AC

## 2023-09-26 MED ORDER — CHLORHEXIDINE GLUCONATE 0.12 % MT SOLN
OROMUCOSAL | Status: AC
  Administered 2023-09-26: 15 mL via OROMUCOSAL
  Filled 2023-09-26: qty 15

## 2023-09-26 MED ORDER — PROPOFOL 10 MG/ML IV BOLUS
INTRAVENOUS | Status: AC
  Filled 2023-09-26: qty 20

## 2023-09-26 MED ORDER — FENTANYL CITRATE (PF) 100 MCG/2ML IJ SOLN: 25.0000 ug | INTRAMUSCULAR | Status: DC | PRN

## 2023-09-26 MED ORDER — FENTANYL CITRATE (PF) 250 MCG/5ML IJ SOLN
INTRAMUSCULAR | Status: DC | PRN
  Administered 2023-09-26: 75 ug via INTRAVENOUS
  Administered 2023-09-26: 50 ug via INTRAVENOUS
  Administered 2023-09-26: 75 ug via INTRAVENOUS
  Administered 2023-09-26: 50 ug via INTRAVENOUS

## 2023-09-26 MED ORDER — PHENYLEPHRINE HCL (PRESSORS) 10 MG/ML IV SOLN
INTRAVENOUS | Status: AC
  Filled 2023-09-26: qty 1

## 2023-09-26 MED ORDER — ONDANSETRON HCL 4 MG/2ML IJ SOLN
INTRAMUSCULAR | Status: DC | PRN
  Administered 2023-09-26: 4 mg via INTRAVENOUS

## 2023-09-26 MED ORDER — EPHEDRINE SULFATE-NACL 50-0.9 MG/10ML-% IV SOSY
PREFILLED_SYRINGE | INTRAVENOUS | Status: DC | PRN
  Administered 2023-09-26: 5 mg via INTRAVENOUS

## 2023-09-26 MED ORDER — CHLORHEXIDINE GLUCONATE 0.12 % MT SOLN: 15.0000 mL | Freq: Once | OROMUCOSAL | Status: AC

## 2023-09-26 MED ORDER — HEMOSTATIC AGENTS (NO CHARGE) OPTIME
TOPICAL | Status: DC | PRN
  Administered 2023-09-26: 1 via TOPICAL

## 2023-09-26 MED ORDER — PHENYLEPHRINE 80 MCG/ML (10ML) SYRINGE FOR IV PUSH (FOR BLOOD PRESSURE SUPPORT)
PREFILLED_SYRINGE | INTRAVENOUS | Status: DC | PRN
  Administered 2023-09-26: 80 ug via INTRAVENOUS
  Administered 2023-09-26: 160 ug via INTRAVENOUS
  Administered 2023-09-26 (×2): 120 ug via INTRAVENOUS
  Administered 2023-09-26: 160 ug via INTRAVENOUS

## 2023-09-26 MED ORDER — ROCURONIUM BROMIDE 10 MG/ML (PF) SYRINGE
PREFILLED_SYRINGE | INTRAVENOUS | Status: DC | PRN
  Administered 2023-09-26: 10 mg via INTRAVENOUS
  Administered 2023-09-26: 50 mg via INTRAVENOUS
  Administered 2023-09-26 (×2): 10 mg via INTRAVENOUS

## 2023-09-26 MED ORDER — DEXAMETHASONE SODIUM PHOSPHATE 10 MG/ML IJ SOLN
INTRAMUSCULAR | Status: DC | PRN
  Administered 2023-09-26: 5 mg via INTRAVENOUS

## 2023-09-26 MED ORDER — BUPIVACAINE-EPINEPHRINE (PF) 0.25% -1:200000 IJ SOLN
INTRAMUSCULAR | Status: AC
  Filled 2023-09-26: qty 30

## 2023-09-26 MED ORDER — ALUM & MAG HYDROXIDE-SIMETH 200-200-20 MG/5ML PO SUSP: 30.0000 mL | ORAL | Status: DC | PRN

## 2023-09-26 MED ORDER — IRBESARTAN 300 MG PO TABS
300.0000 mg | ORAL_TABLET | Freq: Every day | ORAL | Status: DC
  Administered 2023-09-26 – 2023-09-27 (×2): 300 mg via ORAL
  Filled 2023-09-26 (×2): qty 1

## 2023-09-26 MED ORDER — LACTATED RINGERS IV SOLN
INTRAVENOUS | Status: DC
Start: 2023-09-26 — End: 2023-09-26

## 2023-09-26 MED ORDER — INDOCYANINE GREEN 25 MG IV SOLR
2.5000 mg | Freq: Once | INTRAVENOUS | Status: AC
  Administered 2023-09-26: 2.5 mg via INTRAVENOUS
  Filled 2023-09-26: qty 1

## 2023-09-26 SURGICAL SUPPLY — 47 items
BAG COUNTER SPONGE SURGICOUNT (BAG) ×1 IMPLANT
BIOPATCH RED 1 DISK 7.0 (GAUZE/BANDAGES/DRESSINGS) IMPLANT
BLADE CLIPPER SURG (BLADE) IMPLANT
CANISTER SUCT 3000ML PPV (MISCELLANEOUS) ×1 IMPLANT
CHLORAPREP W/TINT 26 (MISCELLANEOUS) ×1 IMPLANT
CLIP LIGATING HEMO O LOK GREEN (MISCELLANEOUS) ×1 IMPLANT
COVER SURGICAL LIGHT HANDLE (MISCELLANEOUS) ×1 IMPLANT
DERMABOND ADVANCED .7 DNX12 (GAUZE/BANDAGES/DRESSINGS) ×1 IMPLANT
DRAIN CHANNEL 19F RND (DRAIN) IMPLANT
DRSG TEGADERM 4X4.75 (GAUZE/BANDAGES/DRESSINGS) IMPLANT
ELECT REM PT RETURN 9FT ADLT (ELECTROSURGICAL) ×1 IMPLANT
ELECTRODE REM PT RTRN 9FT ADLT (ELECTROSURGICAL) ×1 IMPLANT
ENDOLOOP SUT PDS II 0 18 (SUTURE) IMPLANT
EVACUATOR SILICONE 100CC (DRAIN) IMPLANT
GLOVE BIOGEL PI MICRO STRL 6 (GLOVE) ×1 IMPLANT
GLOVE INDICATOR 6.5 STRL GRN (GLOVE) ×1 IMPLANT
GOWN STRL REUS W/ TWL LRG LVL3 (GOWN DISPOSABLE) ×3 IMPLANT
GRASPER SUT TROCAR 14GX15 (MISCELLANEOUS) ×1 IMPLANT
IRRIG SUCT STRYKERFLOW 2 WTIP (MISCELLANEOUS) ×1 IMPLANT
IRRIGATION SUCT STRKRFLW 2 WTP (MISCELLANEOUS) ×1 IMPLANT
KIT BASIN OR (CUSTOM PROCEDURE TRAY) ×1 IMPLANT
KIT IMAGING PINPOINTPAQ (MISCELLANEOUS) IMPLANT
KIT TURNOVER KIT B (KITS) ×1 IMPLANT
L-HOOK LAP DISP 36CM (ELECTROSURGICAL) ×1 IMPLANT
LHOOK LAP DISP 36CM (ELECTROSURGICAL) ×1 IMPLANT
NDL INSUFFLATION 14GA 120MM (NEEDLE) ×1 IMPLANT
NEEDLE INSUFFLATION 14GA 120MM (NEEDLE) ×1 IMPLANT
NS IRRIG 1000ML POUR BTL (IV SOLUTION) ×1 IMPLANT
PAD ARMBOARD 7.5X6 YLW CONV (MISCELLANEOUS) ×1 IMPLANT
PENCIL BUTTON HOLSTER BLD 10FT (ELECTRODE) ×1 IMPLANT
SCISSORS LAP 5X35 DISP (ENDOMECHANICALS) ×1 IMPLANT
SEALANT PATCH FIBRIN 2X4IN (MISCELLANEOUS) IMPLANT
SET TUBE SMOKE EVAC HIGH FLOW (TUBING) ×1 IMPLANT
SLEEVE Z-THREAD 5X100MM (TROCAR) ×2 IMPLANT
SPECIMEN JAR SMALL (MISCELLANEOUS) ×1 IMPLANT
SUT ETHILON 2 0 FS 18 (SUTURE) IMPLANT
SUT MNCRL AB 4-0 PS2 18 (SUTURE) ×1 IMPLANT
SUT VICRYL 0 UR6 27IN ABS (SUTURE) IMPLANT
SYS BAG RETRIEVAL 10MM (BASKET) ×1 IMPLANT
SYSTEM BAG RETRIEVAL 10MM (BASKET) ×1 IMPLANT
TOWEL GREEN STERILE (TOWEL DISPOSABLE) IMPLANT
TOWEL GREEN STERILE FF (TOWEL DISPOSABLE) ×1 IMPLANT
TRAY LAPAROSCOPIC MC (CUSTOM PROCEDURE TRAY) ×1 IMPLANT
TROCAR Z THREAD OPTICAL 12X100 (TROCAR) ×1 IMPLANT
TROCAR Z-THREAD OPTICAL 5X100M (TROCAR) ×1 IMPLANT
WARMER LAPAROSCOPE (MISCELLANEOUS) ×1 IMPLANT
WATER STERILE IRR 1000ML POUR (IV SOLUTION) ×1 IMPLANT

## 2023-09-26 NOTE — Transfer of Care (Signed)
 Immediate Anesthesia Transfer of Care Note  Patient: Gloria Mckinney  Procedure(s) Performed: LAPAROSCOPIC CHOLECYSTECTOMY (Abdomen) INDOCYANINE GREEN FLUORESCENCE IMAGING (ICG) (Abdomen)  Patient Location: PACU  Anesthesia Type:General  Level of Consciousness: awake and drowsy  Airway & Oxygen Therapy: Patient Spontanous Breathing and Patient connected to nasal cannula oxygen  Post-op Assessment: Report given to RN and Post -op Vital signs reviewed and stable  Post vital signs: Reviewed and stable  Last Vitals:  Vitals Value Taken Time  BP 155/73 09/26/23 1048  Temp    Pulse 85 09/26/23 1050  Resp 17 09/26/23 1050  SpO2 99 % 09/26/23 1050  Vitals shown include unfiled device data.  Last Pain:  Vitals:   09/26/23 0732  TempSrc: Oral  PainSc:          Complications: No notable events documented.

## 2023-09-26 NOTE — Anesthesia Preprocedure Evaluation (Signed)
 Anesthesia Evaluation  Patient identified by MRN, date of birth, ID band Patient awake    Reviewed: Allergy & Precautions, H&P , NPO status , Patient's Chart, lab work & pertinent test results  Airway Mallampati: III  TM Distance: >3 FB Neck ROM: Full    Dental no notable dental hx. (+) Teeth Intact, Dental Advisory Given   Pulmonary neg pulmonary ROS   Pulmonary exam normal breath sounds clear to auscultation       Cardiovascular hypertension, Pt. on medications + Peripheral Vascular Disease   Rhythm:Regular Rate:Normal     Neuro/Psych negative neurological ROS  negative psych ROS   GI/Hepatic Neg liver ROS,GERD  Medicated,,  Endo/Other  diabetes, Type 2, Insulin Dependent, Oral Hypoglycemic Agents    Renal/GU negative Renal ROS  negative genitourinary   Musculoskeletal  (+) Arthritis , Osteoarthritis,    Abdominal   Peds  Hematology negative hematology ROS (+)   Anesthesia Other Findings   Reproductive/Obstetrics negative OB ROS                             Anesthesia Physical Anesthesia Plan  ASA: 3  Anesthesia Plan: General   Post-op Pain Management: Ofirmev IV (intra-op)*   Induction: Intravenous  PONV Risk Score and Plan: 4 or greater and Ondansetron, Dexamethasone and Treatment may vary due to age or medical condition  Airway Management Planned: Oral ETT  Additional Equipment:   Intra-op Plan:   Post-operative Plan: Extubation in OR  Informed Consent: I have reviewed the patients History and Physical, chart, labs and discussed the procedure including the risks, benefits and alternatives for the proposed anesthesia with the patient or authorized representative who has indicated his/her understanding and acceptance.     Dental advisory given  Plan Discussed with: CRNA  Anesthesia Plan Comments:        Anesthesia Quick Evaluation

## 2023-09-26 NOTE — Anesthesia Procedure Notes (Signed)
 Procedure Name: Intubation Date/Time: 09/26/2023 7:55 AM  Performed by: Yolonda Kida, CRNAPre-anesthesia Checklist: Patient identified, Emergency Drugs available, Suction available and Patient being monitored Patient Re-evaluated:Patient Re-evaluated prior to induction Oxygen Delivery Method: Circle System Utilized Preoxygenation: Pre-oxygenation with 100% oxygen Induction Type: IV induction Ventilation: Mask ventilation without difficulty Laryngoscope Size: Miller and 3 Grade View: Grade II Tube type: Oral Tube size: 7.0 mm Number of attempts: 1 Airway Equipment and Method: Stylet Placement Confirmation: ETT inserted through vocal cords under direct vision, positive ETCO2 and breath sounds checked- equal and bilateral Secured at: 22 cm Tube secured with: Tape Dental Injury: Teeth and Oropharynx as per pre-operative assessment

## 2023-09-26 NOTE — Op Note (Signed)
 09/26/2023 11:39 AM  PATIENT: Gloria Mckinney  78 y.o. female  Patient Care Team: Mila Palmer, MD as PCP - General (Family Medicine) Serena Croissant, MD as Consulting Physician (Hematology and Oncology) Axel Filler, Larna Daughters, NP as Nurse Practitioner (Hematology and Oncology) Claud Kelp, MD as Consulting Physician (General Surgery) Lonie Peak, MD as Attending Physician (Radiation Oncology)  PRE-OPERATIVE DIAGNOSIS: cholecystitis  POST-OPERATIVE DIAGNOSIS: same  PROCEDURE: laparoscopic cholecystectomy with intraoperative cholangiogram using ICG  SURGEON: Donata Duff, MD  ASSISTANT: None  ANESTHESIA: General endotracheal  EBL: 25cc  DRAINS: 19Fr blake drain   SPECIMEN: Gallbladder  COUNTS: Sponge, needle and instrument counts were reported correct x2 at the conclusion of the operation  DISPOSITION: PACU in satisfactory condition  COMPLICATIONS: None  FINDINGS: ICG cholangiogram employed, CBD seen clearly, short cystic duct. Unavoidable liver capsule tear requiring fibrin gauze for hemostasis. 19 Fr blake drain placed in gallbladder fossa  DESCRIPTION:  The patient was identified & brought into the operating room. She was then positioned supine on the OR table. SCDs were in place and active during the entire case. She then underwent general endotracheal anesthesia. Pressure points were padded. Hair on the abdomen was clipped by the OR team. The abdomen was prepped and draped in the standard sterile fashion. Antibiotics were administered. A surgical timeout was performed and confirmed our plan.   A veress needle was placed in LUQ and aspiration of air on entry to abdomen and positive drop test. Abdomen then insufflated to . Periumbilical incision made and 5mm trocar placed using optiview with a 30 degree scope. Inspection confirmed no evidence of trocar or veress site complications and veress then removed.  The patient was then positioned in  reverse Trendelenburg with slight left side down. A 12 mm supxiphoid trocar was placed under direct visualization and  two additional 5mm trocars were placed along the right subcostal line - one 5mm port in mid subcostal region, another 5mm port in the right flank near the anterior axillary line.  The liver and gallbladder were inspected. The gallbladder was severely inflamed and distended requiring decompression with needle and suction. The gallbladder fundus was grasped and elevated cephalad. An additional grasper was then placed on the infundibulum of the gallbladder and the infundibulum was retracted laterally.  With retraction there was some unavoidable liver tear of the capsule and hemostasis achieved with fibrin gauze.Staying high on the gallbladder, the peritoneum on both sides of the gallbladder was opened with hook cautery. Gentle blunt dissection was then employed with a Art gallery manager working down into [employer]. The cystic duct was identified and carefully circumferentially dissected. The cystic artery was also identified and carefully circumferentially dissected. The space between the cystic artery and hepatocystic plate was developed such that a good view of the liver could be seen through a window medial to the cystic artery. The triangle of [place] had been cleared of all fibrofatty tissue. At this point, a critical view of safety was achieved and the structures visualized was the skeletonized cystic duct laterally, the skeletonized cystic artery and the liver through the window medial to the artery. Cystic duct noted to be slightly dilated but clearly smaller caliber than CBD.  Using ICG, a cholangiogram was employed and no fluorescence of gallbladder or cystic duct but CBD clearly seen where skeletonized cystic duct diving into. Fluorescence also in duodenum.  At this point elected to free gallbladder from liver in dome down fashion. At this point confirmed anatomical location of  CBD and cystic duct  again and then 2 endoloops using 0 PDS were secured to cystic duct. The cystic artery was then clipped with 2 hemolock clips on the patient side and 1 clip on the specimen side and then divided.  Cystic duct then sharply divided.  The gallbladder was then placed into an endocatch bag. Fibrin gauze placed previously was removed. The RUQ was gently irrigated with sterile saline. Hemostasis was then verified. The clips were in good position; the gallbladder fossa was dry. The rest of the abdomen was inspected no injury nor bleeding elsewhere was identified.   Given the short cystic duct and degree of inflammation of gallbladder, elected to placed 19 Fr blake drain in gallbladder fossa to monitor.  The endocatch bag containing the gallbladder was then removed from the subxiphoid port site and passed off as specimen. The subxiphoid port fascia was then closed in a figured of eight fashion with 0 vicryl using a suture passer. The RUQ ports were removed under direct visualization and noted to be hemostatic.Marland Kitchen The fascia was palpated and noted to be completely closed. The abdomen was then desufflated and the periumbilical trocar removed. The skin of all incision sites was approximated with 4-0 monocryl subcuticular suture and dermabond applied. The patient was then awakened from anesthesia, extubated, and transferred to a stretcher for transport to PACU in satisfactory condition.  Instrument, sponge, and needle counts were correct at closure and at the conclusion of the case.    Donata Duff, MD [facility]

## 2023-09-26 NOTE — Progress Notes (Signed)
 [facility] Progress Note  * Day of Surgery *  Subjective: HIDA positive  Objective: Vital signs in last 24 hours: Temp:  [98.3 F (36.8 C)-98.5 F (36.9 C)] 98.5 F (36.9 C) (02/16 0423) Pulse Rate:  [81-91] 91 (02/16 0423) Resp:  [16-18] 18 (02/16 0423) BP: (148-181)/(54-86) 181/81 (02/16 0423) SpO2:  [96 %-100 %] 96 % (02/16 0423) Last BM Date : 09/23/23  Intake/Output from previous day: No intake/output data recorded. Intake/Output this shift: No intake/output data recorded.  PE: Gen:  Alert, NAD, pleasant Card:  Regular rate and rhythm, no lower extremity edema  Pulm:  Normal effort Abd: Soft, nondistended, + Murphy's Skin: warm and dry, no rashes  Psych: A&Ox3   Lab Results:  Recent Labs    09/25/23 0702 09/26/23 0512  WBC 9.2 7.6  HGB 10.7* 10.3*  HCT 31.9* 29.9*  PLT 264 247   BMET Recent Labs    09/25/23 0702 09/26/23 0512  NA 134* 134*  K 3.7 4.0  CL 100 100  CO2 23 23  GLUCOSE 135* 157*  BUN 13 12  CREATININE 1.05* 0.94  CALCIUM 9.5 9.5   PT/INR No results for input(s): "LABPROT", "INR" in the last 72 hours. CMP     Component Value Date/Time   NA 134 (L) 09/26/2023 0512   K 4.0 09/26/2023 0512   CL 100 09/26/2023 0512   CO2 23 09/26/2023 0512   GLUCOSE 157 (H) 09/26/2023 0512   BUN 12 09/26/2023 0512   CREATININE 0.94 09/26/2023 0512   CREATININE 0.92 08/03/2019 0942   CALCIUM 9.5 09/26/2023 0512   PROT 6.0 (L) 09/26/2023 0512   ALBUMIN 2.8 (L) 09/26/2023 0512   AST 14 (L) 09/26/2023 0512   AST 16 08/03/2019 0942   ALT 13 09/26/2023 0512   ALT 22 08/03/2019 0942   ALKPHOS 49 09/26/2023 0512   BILITOT 0.6 09/26/2023 0512   BILITOT 0.3 08/03/2019 0942   GFRNONAA >60 09/26/2023 0512   GFRNONAA >60 08/03/2019 0942   GFRAA >60 08/03/2019 0942   Lipase     Component Value Date/Time   LIPASE 39 09/24/2023 0653       Studies/Results: NM Hepatobiliary Liver Func Result Date: 09/25/2023 CLINICAL DATA:  Right  upper quadrant abdominal pain mild gallbladder wall thickening concerning for cholecystitis. EXAM: NUCLEAR MEDICINE HEPATOBILIARY IMAGING TECHNIQUE: Sequential images of the abdomen were obtained out to 60 minutes following intravenous administration of radiopharmaceutical. RADIOPHARMACEUTICALS:  5.1 mCi Tc-20m  Choletec IV (initial) 1.0 mCi Tc-73m Choletec IV (booster prior to morphine administration) 3 mg morphine IV COMPARISON:  Multiple exams, including abdominal ultrasound 09/24/2023 FINDINGS: Satisfactory uptake of radiopharmaceutical from the gallbladder with biliary activity visible at 3 minutes bowel activity 9 minutes. Non filling of the gallbladder of the first 60 minutes. Following administration of 3 mg morphine and additional 1.0 millicurie of Choletec, there is no gallbladder filling at total 90 minutes. IMPRESSION: 1. Lack of filling of the gallbladder despite morphine injection. This implies cystic duct occlusion and favors acute cholecystitis. 2. Patent common bile duct. Normal hepatocellular uptake of radiopharmaceutical. Electronically Signed   By: Gaylyn Rong M.D.   On: 09/25/2023 11:41   US Abdomen Limited RUQ (LIVER/GB) Result Date: 09/24/2023 CLINICAL DATA:  Right upper quadrant abdominal pain EXAM: ULTRASOUND ABDOMEN LIMITED RIGHT UPPER QUADRANT COMPARISON:  Ultrasound 12/02/2022.  CT 09/24/2023 earlier. FINDINGS: Gallbladder: Distended gallbladder. Some layering sludge suggested. There is also some wall thickening. No shadowing stones. No reported sonographic Murphy's sign. Common bile duct:  Diameter: 9 mm, at the upper limits normal for patient's age Liver: No focal lesion identified. Within normal limits in parenchymal echogenicity. Portal vein is patent on color Doppler imaging with normal direction of blood flow towards the liver. Other: None. IMPRESSION: Slight wall thickening of the gallbladder with some sludge. No obvious stones or ductal dilatation. Please correlate with  symptoms and recommend further workup based on the findings by CT scan such as pre and postcontrast MRI. Aggressive process is in the differential. Electronically Signed   By: Karen Kays M.D.   On: 09/24/2023 13:09   CT Angio Chest/Abd/Pel for Dissection W and/or Wo Contrast Result Date: 09/24/2023 CLINICAL DATA:  Acute aortic syndrome (AAS) suspected. Abdominal pain. EXAM: CT ANGIOGRAPHY CHEST, ABDOMEN AND PELVIS TECHNIQUE: Non-contrast CT of the chest was initially obtained. Multidetector CT imaging through the chest, abdomen and pelvis was performed using the standard protocol during bolus administration of intravenous contrast. Multiplanar reconstructed images and MIPs were obtained and reviewed to evaluate the vascular anatomy. RADIATION DOSE REDUCTION: This exam was performed according to the departmental dose-optimization program which includes automated exposure control, adjustment of the mA and/or kV according to patient size and/or use of iterative reconstruction technique. CONTRAST:  75mL OMNIPAQUE IOHEXOL 350 MG/ML SOLN COMPARISON:  CT scan abdomen and pelvis from 12/02/2022 FINDINGS: CTA CHEST FINDINGS Cardiovascular: No intramural hematoma noted in the thoracic aorta on the unenhanced images. Thoracic aorta is normal in caliber without aneurysm, dissection, vasculitis or significant stenosis. Normal cardiac size. No pericardial effusion. No aortic aneurysm. There are coronary artery calcifications, in keeping with coronary artery disease. There are also mild-to-moderate peripheral atherosclerotic vascular calcifications of thoracic aorta and its major branches. Mediastinum/Nodes: Visualized thyroid gland appears grossly unremarkable. No solid / cystic mediastinal masses. The esophagus is nondistended precluding optimal assessment. No axillary, mediastinal or hilar lymphadenopathy by size criteria. Lungs/Pleura: The central tracheo-bronchial tree is patent. There are dependent changes in bilateral  lungs. No mass or consolidation. No pleural effusion or pneumothorax. No suspicious lung nodules. Musculoskeletal: Focal scarring and surgical staples noted in the medial left breast. There is a well-circumscribed proximally 2.1 x 2.9 cm opacity in the right breast, inferolaterally, not well characterized on the current exam. There is additional smaller opacity in the retroareolar region. Please refer to prior mammogram for details. The visualized soft tissues of the chest wall are grossly unremarkable. No suspicious osseous lesions. There are mild to moderate multilevel degenerative changes in the visualized spine. Review of the MIP images confirms the above findings. CTA ABDOMEN AND PELVIS FINDINGS VASCULAR Aorta: Normal caliber aorta without aneurysm, dissection, vasculitis or significant stenosis. Celiac: Patent without evidence of aneurysm, dissection, vasculitis or significant stenosis. SMA: Patent without evidence of aneurysm, dissection, vasculitis or significant stenosis. Renals: Both renal arteries are patent without evidence of aneurysm, dissection, vasculitis, fibromuscular dysplasia or significant stenosis. IMA: Patent without evidence of aneurysm, dissection, vasculitis or significant stenosis. Inflow: Patent without evidence of aneurysm, dissection, vasculitis or significant stenosis. Veins: No obvious venous abnormality within the limitations of this arterial phase study. Review of the MIP images confirms the above findings. NON-VASCULAR Hepatobiliary: The liver is normal in size. There is subtle irregularity/nodularity of the liver surface, nonspecific. No frank cirrhotic configuration. Correlate clinically and with liver function tests to exclude liver parenchymal disease. No suspicious mass. No intrahepatic or extrahepatic bile duct dilation. No calcified gallstones. Normal gallbladder wall thickness. No pericholecystic inflammatory changes. Pancreas: Unremarkable. No pancreatic ductal dilatation  or surrounding inflammatory changes. Spleen: Within  normal limits. No focal lesion. Adrenals/Urinary Tract: Adrenal glands are unremarkable. No suspicious renal mass. No hydronephrosis. No renal or ureteric calculi. Unremarkable urinary bladder. Stomach/Bowel: There is a small sliding hiatal hernia. There is a small diverticulum arising from the second part of duodenum. No disproportionate dilation of the small or large bowel loops. No evidence of abnormal bowel wall thickening or inflammatory changes. The appendix is surgically absent. Vascular/Lymphatic: No ascites or pneumoperitoneum. No abdominal or pelvic lymphadenopathy, by size criteria. No aneurysmal dilation of the major abdominal arteries. There are mild peripheral atherosclerotic vascular calcifications of the aorta and its major branches. Reproductive: The uterus is surgically absent. No large adnexal mass. Other: There is a tiny fat containing umbilical hernia. The soft tissues and abdominal wall are otherwise unremarkable. Musculoskeletal: No suspicious osseous lesions. There are mild - moderate multilevel degenerative changes in the visualized spine. Review of the MIP images confirms the above findings. IMPRESSION: 1. No acute aortic syndrome. No acute thoracic aortic intramural hematoma. No thoracoabdominal aortic aneurysm, dissection or penetrating atherosclerotic ulcer. 2. No acute inflammatory process identified within the abdomen or pelvis. No bowel obstruction. 3. Well-circumscribed opacity in the right breast, not well evaluated on the current exam. Please refer to prior mammogram for details. 4. Multiple other nonacute observations, as described above. Aortic Atherosclerosis (ICD10-I70.0). Electronically Signed   By: Jules Schick M.D.   On: 09/24/2023 10:47    Anti-infectives: Anti-infectives (From admission, onward)    Start     Dose/Rate Route Frequency Ordered Stop   09/25/23 1530  [MAR Hold]  ciprofloxacin (CIPRO) IVPB 400 mg         (MAR Hold since Sun 09/26/2023 at 0724.Hold Reason: Transfer to a Procedural area)   400 mg 200 mL/hr over 60 Minutes Intravenous Every 12 hours 09/25/23 1442        Assessment/Plan RUQ pain, R/o acalculous cholecystitis  - + Murphy's sign and + HIDA  We discussed the etiology of patient's pain, we discussed treatment options and recommended surgery. We discussed details of surgery including general anesthesia, laparoscopic approach, identification of cystic duct and common bile duct. Ligation of cystic duct and cystic artery. Possible need for open procedure, and subtotal cholecystectomy. Possible risks of common bile duct injury, injury to surrounding structures, bile leak, bleeding, infection, diarrhea, and hernia. We discussed alternatives such as cholecystostomy tube placed by IR and need to go home with tube for several weeks. The patient showed good understanding and all questions were answered. She wishes to proceed with surgery   NPO for OR ID: Cipro given penicillin allergy VTE: SCD's, ok for chemical VTE    LOS: 1 day   I reviewed nursing notes, hospitalist notes, last 24 h vitals and pain scores, last 48 h intake and output, last 24 h labs and trends, and last 24 h imaging results.  This care required moderate level of medical decision making.   Donata Duff, MD [facility]

## 2023-09-26 NOTE — Anesthesia Postprocedure Evaluation (Signed)
 Anesthesia Post Note  Patient: Gloria Mckinney  Procedure(s) Performed: LAPAROSCOPIC CHOLECYSTECTOMY (Abdomen) INDOCYANINE GREEN FLUORESCENCE IMAGING (ICG) (Abdomen)     Patient location during evaluation: PACU Anesthesia Type: General Level of consciousness: awake and alert Pain management: pain level controlled Vital Signs Assessment: post-procedure vital signs reviewed and stable Respiratory status: spontaneous breathing, nonlabored ventilation, respiratory function stable and patient connected to nasal cannula oxygen Cardiovascular status: blood pressure returned to baseline and stable Postop Assessment: no apparent nausea or vomiting Anesthetic complications: no  No notable events documented.  Last Vitals:  Vitals:   09/26/23 1100 09/26/23 1115  BP: (!) 167/68 (!) 159/79  Pulse: 88 86  Resp: 20 18  Temp: 36.7 C   SpO2: 99% 97%    Last Pain:  Vitals:   09/26/23 1048  TempSrc:   PainSc: Asleep                 Camela Wich,W. EDMOND

## 2023-09-26 NOTE — Progress Notes (Signed)
 Gloria Mckinney  ZOX:[id number] DOB: August 10, 1946 DOA: 09/24/2023 PCP: Mila Palmer, MD    Brief Narrative:  78 year old with a history of HTN, HLD, DM2, PVD, and breast cancer status post left breast lumpectomy and chemo/XRT who presented to the ER 2/14 with right upper quadrant abdominal pain and nausea without vomiting.  In the ER CT abdomen revealed no acute inflammatory process within the abdomen or pelvis.  Ultrasound of the abdomen noted slight wall thickening of the gallbladder with some sludge but no obvious stones or ductal dilatation.  General surgery was consulted and suggested a HIDA scan.  Goals of Care:   Code Status: Full Code   DVT prophylaxis: SCDs Start: 09/24/23 1547   Interim Hx: The patient is seen in her room postoperatively.  She is resting comfortably in bed.  She states her pain is reasonably well-controlled.  She denies shortness of breath.  She has no new complaints at this time.  Assessment & Plan:  Right upper quadrant abdominal pain CT abdomen unrevealing but ultrasound suggested gallbladder sludge - HIDA scan + - pt to OR for lap chole 2/16 -postoperative care per General Surgery  Hyponatremia Felt to be related to hypovolemia as well as use of diuretic -resolved with volume expansion  Uncontrolled hypertension Blood pressure remains poorly controlled -resume usual home blood pressure medicines and monitor trend  DM2 A1c 7.3 -on Ozempic Amaryl metformin and insulin but reported that she had not taken her insulin in over a month -CBG currently well-controlled -no change in treatment regimen  CKD stage II Baseline creatinine 1.2 -creatinine stable at baseline throughout this hospitalization  HLD Continue Zocor  Abnormal imaging of right breast Incidentally noted on CT scanning of abdomen with mammogram February 2024 revealing no acute findings -due for repeat mammogram 10/07/2023  Left breast cancer Status postlumpectomy with  chemotherapy and XRT -continue daily Arimidex  Proteinuria Incidentally noted on UA at time of admission -will need to be followed up in the outpatient setting  Family Communication: Spoke with daughter at bedside Disposition: Will depend upon results of HIDA scan and if general surgery feels surgical intervention is necessary   Objective: Blood pressure (!) 179/70, pulse 78, temperature 97.9 F (36.6 C), temperature source Oral, resp. rate 17, height 5\' 2"  (1.575 m), weight 77.1 kg, SpO2 97%.  Intake/Output Summary (Last 24 hours) at 09/26/2023 1507 Last data filed at 09/26/2023 1300 Gross per 24 hour  Intake 1020 ml  Output 50 ml  Net 970 ml    Filed Weights   09/24/23 0649  Weight: 77.1 kg    Examination: General: No acute respiratory distress Lungs: Clear to auscultation bilaterally without wheezes or crackles Cardiovascular: Regular rate and rhythm without murmur gallop or rub normal S1 and S2 Abdomen: NT/ND, soft, BS positive Extremities: No significant cyanosis, clubbing, or edema bilateral lower extremities  CBC: Recent Labs  Lab 09/24/23 0653 09/24/23 0728 09/25/23 0702 09/26/23 0512  WBC 14.4*  --  9.2 7.6  HGB 12.3 13.6 10.7* 10.3*  HCT 36.1 40.0 31.9* 29.9*  MCV 80.9  --  82.0 81.9  PLT 296  --  264 247   Basic Metabolic Panel: Recent Labs  Lab 09/24/23 0653 09/24/23 0728 09/25/23 0702 09/26/23 0512  NA 127* 128* 134* 134*  K 4.1 4.0 3.7 4.0  CL 90* 91* 100 100  CO2 23  --  23 23  GLUCOSE 215* 216* 135* 157*  BUN 17 19 13 12   CREATININE 1.20* 1.10* 1.05* 0.94  CALCIUM 9.6  --  9.5 9.5   GFR: Estimated Creatinine Clearance: 49.7 mL/min (by C-G formula based on SCr of 0.94 mg/dL).   Scheduled Meds:  amLODipine  10 mg Oral Daily   anastrozole  1 mg Oral Daily   carvedilol  3.125 mg Oral BID WC   irbesartan  300 mg Oral Daily   And   hydrochlorothiazide  12.5 mg Oral Daily   insulin aspart  0-9 Units Subcutaneous TID WC   irbesartan  300  mg Oral Daily   pantoprazole  80 mg Oral Daily   simvastatin  40 mg Oral q1800     LOS: 1 day   Lonia Blood, MD [facility] Office  [phone] Pager - Text Page per Loretha Stapler  If 7PM-7AM, please contact night-coverage per Amion 09/26/2023, 3:07 PM

## 2023-09-27 ENCOUNTER — Encounter (HOSPITAL_COMMUNITY): Payer: Self-pay | Admitting: General Surgery

## 2023-09-27 DIAGNOSIS — K810 Acute cholecystitis: Secondary | ICD-10-CM | POA: Diagnosis not present

## 2023-09-27 DIAGNOSIS — R1011 Right upper quadrant pain: Secondary | ICD-10-CM | POA: Diagnosis not present

## 2023-09-27 LAB — COMPREHENSIVE METABOLIC PANEL
ALT: 30 U/L (ref 0–44)
AST: 29 U/L (ref 15–41)
Albumin: 2.7 g/dL — ABNORMAL LOW (ref 3.5–5.0)
Alkaline Phosphatase: 48 U/L (ref 38–126)
Anion gap: 11 (ref 5–15)
BUN: 13 mg/dL (ref 8–23)
CO2: 24 mmol/L (ref 22–32)
Calcium: 9.5 mg/dL (ref 8.9–10.3)
Chloride: 97 mmol/L — ABNORMAL LOW (ref 98–111)
Creatinine, Ser: 1.03 mg/dL — ABNORMAL HIGH (ref 0.44–1.00)
GFR, Estimated: 57 mL/min — ABNORMAL LOW (ref >60)
Glucose, Bld: 139 mg/dL — ABNORMAL HIGH (ref 70–99)
Potassium: 3.9 mmol/L (ref 3.5–5.1)
Sodium: 132 mmol/L — ABNORMAL LOW (ref 135–145)
Total Bilirubin: 0.7 mg/dL (ref 0.0–1.2)
Total Protein: 5.8 g/dL — ABNORMAL LOW (ref 6.5–8.1)

## 2023-09-27 LAB — GLUCOSE, CAPILLARY
Glucose-Capillary: 157 mg/dL — ABNORMAL HIGH (ref 70–99)
Glucose-Capillary: 233 mg/dL — ABNORMAL HIGH (ref 70–99)

## 2023-09-27 LAB — CBC
HCT: 29 % — ABNORMAL LOW (ref 36.0–46.0)
Hemoglobin: 9.9 g/dL — ABNORMAL LOW (ref 12.0–15.0)
MCH: 27.9 pg (ref 26.0–34.0)
MCHC: 34.1 g/dL (ref 30.0–36.0)
MCV: 81.7 fL (ref 80.0–100.0)
Platelets: 241 10*3/uL (ref 150–400)
RBC: 3.55 MIL/uL — ABNORMAL LOW (ref 3.87–5.11)
RDW: 12.6 % (ref 11.5–15.5)
WBC: 12.6 10*3/uL — ABNORMAL HIGH (ref 4.0–10.5)
nRBC: 0 % (ref 0.0–0.2)

## 2023-09-27 MED ORDER — VALSARTAN-HYDROCHLOROTHIAZIDE 320-12.5 MG PO TABS: 1.0000 | ORAL_TABLET | Freq: Every day | ORAL | 2 refills | Status: DC

## 2023-09-27 MED ORDER — ACETAMINOPHEN 325 MG PO TABS: 650.0000 mg | ORAL_TABLET | Freq: Four times a day (QID) | ORAL | Status: DC | PRN

## 2023-09-27 MED ORDER — DOCUSATE SODIUM 100 MG PO CAPS: 100.0000 mg | ORAL_CAPSULE | Freq: Two times a day (BID) | ORAL | Status: DC | PRN

## 2023-09-27 MED ORDER — CARVEDILOL 3.125 MG PO TABS
3.1250 mg | ORAL_TABLET | Freq: Two times a day (BID) | ORAL | 2 refills | Status: DC
Start: 2023-09-27 — End: 2023-12-21

## 2023-09-27 MED ORDER — OXYCODONE HCL 5 MG PO TABS: 5.0000 mg | ORAL_TABLET | Freq: Four times a day (QID) | ORAL | 0 refills | Status: AC | PRN

## 2023-09-27 MED ORDER — AMLODIPINE BESYLATE 10 MG PO TABS: 10.0000 mg | ORAL_TABLET | Freq: Every day | ORAL | 2 refills | Status: DC

## 2023-09-27 NOTE — Plan of Care (Signed)

## 2023-09-27 NOTE — TOC Transition Note (Signed)
 Transition of Care [facility]) - Discharge Note   Patient Details  Name: Syniah Berne MRN: [id number] Date of Birth: May 16, 1946  [facility] CM/SW Contact:  Kermit Balo, RN Phone Number: 09/27/2023, 2:06 PM   Clinical Narrative:     Pt is discharging home with family.  No needs per TOC.   Final next level of care: Home/Self Care Barriers to Discharge: No Barriers Identified   Patient Goals and CMS Choice            Discharge Placement                       Discharge Plan and Services Additional resources added to the After Visit Summary for                                       Social Drivers of Health (SDOH) Interventions SDOH Screenings   Food Insecurity: Patient Declined (09/25/2023)  Housing: Patient Declined (09/25/2023)  Transportation Needs: Patient Declined (09/25/2023)  Utilities: Not At Risk (09/25/2023)  Social Connections: Unknown (09/25/2023)  Tobacco Use: Low Risk  (09/26/2023)     Readmission Risk Interventions     No data to display

## 2023-09-27 NOTE — Plan of Care (Signed)

## 2023-09-27 NOTE — Discharge Summary (Signed)
 DISCHARGE SUMMARY  Gloria Mckinney  MR#: [id number]  DOB:1946/05/09  Date of Admission: 09/24/2023 Date of Discharge: 09/27/2023  Attending Physician:Caisley Baxendale Silvestre Gunner, MD  Patient's WUJ:[id number], Gloria December, MD  Disposition: D/C home   Follow-up Appts:  Follow-up Information     Maczis, Hedda Slade, [place]. Go on 10/21/2023.   Specialty: General Surgery Why: 3/13 at 2:30 pm. Please arrive 30 minutes early to complete check in, and bring photo ID and insurance card. Contact information: [address] [place] [address] [facility] [place] [address] [phone]         Mila Palmer, MD. Schedule an appointment as soon as possible for a visit in 10 day(s).   Specialty: Family Medicine Why: For a recheck of BP and to discuss arranging an outpatient sleep study. Contact information: [address] [place] [address] [phone]                 Tests Needing Follow-up: -repeat UA should be accomplished as an outpatient to re-evaluate proteinuria that was noted on UA during this admission -BP should be monitored in outpt setting as HTN meds were changed during this admit -on ROS pt reported sx c/w sleep apnea - an outpt sleep study is suggested  -close monitoring of her DM is indicated - consider use of a continuous glucose monitor int he outpt setting  -assure patient completes scheduled outpatient mammogram to investigate lesion incidentally noted in R breast on imaging this admission   Discharge Diagnoses: Acute cholecystitis - right upper quadrant abdominal pain Hyponatremia Uncontrolled hypertension DM2 CKD stage II HLD Abnormal imaging of right breast Left breast cancer Proteinuria   Initial presentation: 78 year old with a history of HTN, HLD, DM2, PVD, and breast cancer status post left breast lumpectomy and chemo/XRT who presented to the ER 2/14 with right upper quadrant abdominal pain and nausea  without vomiting. In the ER CT abdomen revealed no acute inflammatory process within the abdomen or pelvis. Ultrasound of the abdomen noted slight wall thickening of the gallbladder with some sludge but no obvious stones or ductal dilatation. General surgery was consulted and suggested a HIDA scan.   Hospital Course:  Acute cholecystitis - right upper quadrant abdominal pain CT abdomen unrevealing but ultrasound suggested gallbladder sludge - HIDA scan + - pt to OR for lap chole 2/16 - postoperative care per General Surgery - cleared for d/c home by Surgery, w/ JP drain removed at time of d/c    Hyponatremia Felt to be related to hypovolemia as well as use of diuretic -sodium stable   Uncontrolled hypertension Blood pressure control has improved with titration of medications   DM2 A1c 7.3 -on Ozempic, Amaryl, metformin, and insulin but reported that she had not taken her insulin in over a month - CBG reasonably well-controlled - no change in treatment regimen   CKD stage II Baseline creatinine 1.2 - creatinine stable at baseline throughout this hospitalization   HLD Continue Zocor   Abnormal imaging of right breast Incidentally noted on CT scanning of abdomen with mammogram February 2024 revealing no acute findings -due for repeat mammogram 10/07/2023   Left breast cancer Status postlumpectomy with chemotherapy and XRT -continue daily Arimidex   Proteinuria Incidentally noted on UA at time of admission -will need to be followed up in the outpatient setting  Allergies as of 09/27/2023       Reactions   Penicillins Swelling, Other (See Comments)   Has patient had a PCN reaction  causing immediate rash, facial/tongue/throat swelling, SOB or lightheadedness with hypotension: Yes Has patient had a PCN reaction causing severe rash involving mucus membranes or skin necrosis: No Has patient had a PCN reaction that required hospitalization: No Has patient had a PCN reaction occurring within  the last 10 years: No If all of the above answers are "NO", then may proceed with Cephalosporin use.   Cephalexin Itching   Latex Swelling   Statins Other (See Comments)   MYALGIAS        Medication List     STOP taking these medications    furosemide 20 MG tablet Commonly known as: LASIX   hydrochlorothiazide 12.5 MG capsule Commonly known as: MICROZIDE       TAKE these medications    acetaminophen 325 MG tablet Commonly known as: TYLENOL Take 2 tablets (650 mg total) by mouth every 6 (six) hours as needed for mild pain (pain score 1-3) (or Fever >/= 101).   amLODipine 10 MG tablet Commonly known as: NORVASC Take 1 tablet (10 mg total) by mouth daily. Start taking on: September 28, 2023   anastrozole 1 MG tablet Commonly known as: ARIMIDEX Take 1 tablet (1 mg total) by mouth daily.   aspirin EC 81 MG tablet Take 81 mg by mouth daily.   CALCIUM + VITAMIN D3 PO Take 1 tablet by mouth daily.   carvedilol 3.125 MG tablet Commonly known as: COREG Take 1 tablet (3.125 mg total) by mouth 2 (two) times daily with a meal.   docusate sodium 100 MG capsule Commonly known as: Colace Take 1 capsule (100 mg total) by mouth 2 (two) times daily as needed for mild constipation.   glimepiride 4 MG tablet Commonly known as: AMARYL Take 4 mg by mouth 2 (two) times daily.   insulin degludec 100 UNIT/ML FlexTouch Pen Commonly known as: TRESIBA Inject 30 Units into the skin daily.   metFORMIN 1000 MG tablet Commonly known as: GLUCOPHAGE Take 1,000 mg by mouth 2 (two) times daily with a meal.   omeprazole 40 MG capsule Commonly known as: PRILOSEC Take 40 mg by mouth every morning.   oxyCODONE 5 MG immediate release tablet Commonly known as: Oxy IR/ROXICODONE Take 1 tablet (5 mg total) by mouth every 6 (six) hours as needed for up to 5 days for moderate pain (pain score 4-6) or severe pain (pain score 7-10).   Ozempic (0.25 or 0.5 MG/DOSE) 2 MG/3ML Sopn Generic drug:  Semaglutide(0.25 or 0.5MG /DOS) Inject 0.5 mg into the skin once a week.   simvastatin 40 MG tablet Commonly known as: ZOCOR Take 40 mg by mouth daily.   valsartan-hydrochlorothiazide 320-12.5 MG tablet Commonly known as: DIOVAN-HCT Take 1 tablet by mouth daily.        Day of Discharge BP 129/72   Pulse 81   Temp 98.2 F (36.8 C) (Oral)   Resp 16   Ht 5\' 2"  (1.575 m)   Wt 77.1 kg   SpO2 97%   BMI 31.09 kg/m   Physical Exam: General: No acute respiratory distress Lungs: Clear to auscultation bilaterally without wheezes or crackles Cardiovascular: Regular rate and rhythm without murmur gallop or rub normal S1 and S2 Abdomen: Nontender, nondistended, soft, bowel sounds positive, no rebound, no ascites, no appreciable mass Extremities: No significant cyanosis, clubbing, or edema bilateral lower extremities  Basic Metabolic Panel: Recent Labs  Lab 09/24/23 0653 09/24/23 0728 09/25/23 0702 09/26/23 0512 09/27/23 0554  NA 127* 128* 134* 134* 132*  K 4.1 4.0 3.7  4.0 3.9  CL 90* 91* 100 100 97*  CO2 23  --  23 23 24   GLUCOSE 215* 216* 135* 157* 139*  BUN 17 19 13 12 13   CREATININE 1.20* 1.10* 1.05* 0.94 1.03*  CALCIUM 9.6  --  9.5 9.5 9.5    CBC: Recent Labs  Lab 09/24/23 0653 09/24/23 0728 09/25/23 0702 09/26/23 0512 09/27/23 0554  WBC 14.4*  --  9.2 7.6 12.6*  HGB 12.3 13.6 10.7* 10.3* 9.9*  HCT 36.1 40.0 31.9* 29.9* 29.0*  MCV 80.9  --  82.0 81.9 81.7  PLT 296  --  264 247 241    Time spent in discharge (includes decision making & examination of pt): 35 minutes  09/27/2023, 1:55 PM   Lonia Blood, MD [facility] Office  [phone]

## 2023-09-27 NOTE — Discharge Instructions (Signed)
 [facility] ______[facility] LAPAROSCOPIC SURGERY: POST OP INSTRUCTIONS Always review your discharge instruction sheet given to you by the facility where your surgery was performed. IF YOU HAVE DISABILITY OR FAMILY LEAVE FORMS, YOU MUST BRING THEM TO THE OFFICE FOR PROCESSING.   DO NOT GIVE THEM TO YOUR DOCTOR.  A prescription for pain medication may be given to you upon discharge.  Take your pain medication as prescribed, if needed.  If narcotic pain medicine is not needed, then you may take acetaminophen (Tylenol) or ibuprofen (Advil) as needed. Take your usually prescribed medications unless otherwise directed. If you need a refill on your pain medication, please contact your pharmacy.  They will contact our office to request authorization. Prescriptions will not be filled after 5pm or on week-ends. You should follow a light diet the first few days after arrival home, such as soup and crackers, etc.  Be sure to include lots of fluids daily. Most patients will experience some swelling and bruising in the area of the incisions.  Ice packs will help.  Swelling and bruising can take several days to resolve.  It is common to experience some constipation if taking pain medication after surgery.  Increasing fluid intake and taking a stool softener (such as Colace) will usually help or prevent this problem from occurring.  A mild laxative (Milk of Magnesia or Miralax) should be taken according to package instructions if there are no bowel movements after 48 hours. Unless discharge instructions indicate otherwise, you may remove your bandages 24-48 hours after surgery, and you may shower at that time.  You may have steri-strips (small skin tapes) in place directly over the incision.  These strips should be left on the skin for 7-10 days.  If your surgeon used skin glue on the incision, you may shower in 24 hours.  The glue will flake off over the next 2-3 weeks.  Any sutures or staples will be  removed at the office during your follow-up visit. ACTIVITIES:  You may resume regular (light) daily activities beginning the next day--such as daily self-care, walking, climbing stairs--gradually increasing activities as tolerated.  You may have sexual intercourse when it is comfortable.  Refrain from any heavy lifting or straining until approved by your doctor. You may drive when you are no longer taking prescription pain medication, you can comfortably wear a seatbelt, and you can safely maneuver your car and apply brakes. RETURN TO WORK:  __________________________________________________________ Gloria Mckinney should see your doctor in the office for a follow-up appointment approximately 2-3 weeks after your surgery.  Make sure that you call for this appointment within a day or two after you arrive home to insure a convenient appointment time. OTHER INSTRUCTIONS: __________________________________________________________________________________________________________________________ __________________________________________________________________________________________________________________________ WHEN TO CALL YOUR DOCTOR: Fever over 101.0 Inability to urinate Continued bleeding from incision. Increased pain, redness, or drainage from the incision. Increasing abdominal pain  The clinic staff is available to answer your questions during regular business hours.  Please dont hesitate to call and ask to speak to one of the nurses for clinical concerns.  If you have a medical emergency, go to the nearest emergency room or call 911.  A surgeon from [facility] is always on call at the hospital. [address], [address], [place], [place]  [address] ? [address] [address], [place], [place]   [address] [phone] ? [phone] ? FAX [phone] Web site: [email or web address]    Managing Your Pain After Surgery Without Opioids    Thank you for  participating in our program to  help patients manage their pain after surgery without opioids. This is part of our effort to provide you with the best care possible, without exposing you or your family to the risk that opioids pose.  What pain can I expect after surgery? You can expect to have some pain after surgery. This is normal. The pain is typically worse the day after surgery, and quickly begins to get better. Many studies have found that many patients are able to manage their pain after surgery with Over-the-Counter (OTC) medications such as Tylenol and Motrin. If you have a condition that does not allow you to take Tylenol or Motrin, notify your surgical team.  How will I manage my pain? The best strategy for controlling your pain after surgery is around the clock pain control with Tylenol (acetaminophen) and Motrin (ibuprofen or Advil). Alternating these medications with each other allows you to maximize your pain control. In addition to Tylenol and Motrin, you can use heating pads or ice packs on your incisions to help reduce your pain.  How will I alternate your regular strength over-the-counter pain medication? You will take a dose of pain medication every three hours. Start by taking 650 mg of Tylenol (2 pills of 325 mg) 3 hours later take 600 mg of Motrin (3 pills of 200 mg) 3 hours after taking the Motrin take 650 mg of Tylenol 3 hours after that take 600 mg of Motrin.   - 1 -  See example - if your first dose of Tylenol is at 12:00 PM   12:00 PM Tylenol 650 mg (2 pills of 325 mg)  3:00 PM Motrin 600 mg (3 pills of 200 mg)  6:00 PM Tylenol 650 mg (2 pills of 325 mg)  9:00 PM Motrin 600 mg (3 pills of 200 mg)  Continue alternating every 3 hours   We recommend that you follow this schedule around-the-clock for at least 3 days after surgery, or until you feel that it is no longer needed. Use the table on the last page of this handout to keep track of the medications you are taking. Important: Do not take  more than 3000mg  of Tylenol or 3200mg  of Motrin in a 24-hour period. Do not take ibuprofen/Motrin if you have a history of bleeding stomach ulcers, severe kidney disease, &/or actively taking a blood thinner  What if I still have pain? If you have pain that is not controlled with the over-the-counter pain medications (Tylenol and Motrin or Advil) you might have what we call breakthrough pain. You will receive a prescription for a small amount of an opioid pain medication such as Oxycodone, Tramadol, or Tylenol with Codeine. Use these opioid pills in the first 24 hours after surgery if you have breakthrough pain. Do not take more than 1 pill every 4-6 hours.  If you still have uncontrolled pain after using all opioid pills, don't hesitate to call our staff using the number provided. We will help make sure you are managing your pain in the best way possible, and if necessary, we can provide a prescription for additional pain medication.   Day 1    Time  Name of Medication Number of pills taken  Amount of Acetaminophen  Pain Level   Comments  AM PM       AM PM       AM PM       AM PM       AM PM  AM PM       AM PM       AM PM       Total Daily amount of Acetaminophen Do not take more than  3,000 mg per day      Day 2    Time  Name of Medication Number of pills taken  Amount of Acetaminophen  Pain Level   Comments  AM PM       AM PM       AM PM       AM PM       AM PM       AM PM       AM PM       AM PM       Total Daily amount of Acetaminophen Do not take more than  3,000 mg per day      Day 3    Time  Name of Medication Number of pills taken  Amount of Acetaminophen  Pain Level   Comments  AM PM       AM PM       AM PM       AM PM          AM PM       AM PM       AM PM       AM PM       Total Daily amount of Acetaminophen Do not take more than  3,000 mg per day      Day 4    Time  Name of Medication Number of pills taken  Amount of  Acetaminophen  Pain Level   Comments  AM PM       AM PM       AM PM       AM PM       AM PM       AM PM       AM PM       AM PM       Total Daily amount of Acetaminophen Do not take more than  3,000 mg per day      Day 5    Time  Name of Medication Number of pills taken  Amount of Acetaminophen  Pain Level   Comments  AM PM       AM PM       AM PM       AM PM       AM PM       AM PM       AM PM       AM PM       Total Daily amount of Acetaminophen Do not take more than  3,000 mg per day       Day 6    Time  Name of Medication Number of pills taken  Amount of Acetaminophen  Pain Level  Comments  AM PM       AM PM       AM PM       AM PM       AM PM       AM PM       AM PM       AM PM       Total Daily amount of Acetaminophen Do not take more than  3,000 mg per day      Day 7    Time  Name of Medication Number of pills taken  Amount of Acetaminophen  Pain Level   Comments  AM PM       AM PM       AM PM       AM PM       AM PM       AM PM       AM PM       AM PM       Total Daily amount of Acetaminophen Do not take more than  3,000 mg per day        For additional information about how and where to safely dispose of unused opioid medications - [email or web address]  Disclaimer: This document contains information and/or instructional materials adapted from [facility] for the typical patient with your condition. It does not replace medical advice from your health care provider because your experience may differ from that of the typical patient. Talk to your health care provider if you have any questions about this document, your condition or your treatment plan. Adapted from [facility]

## 2023-09-27 NOTE — Progress Notes (Signed)
 Progress Note  1 Day Post-Op  Subjective: Having some post op abdominal pain. Drinking clear liquids without significant issues. Ambulated in room.   Daughter at bedside  Objective: Vital signs in last 24 hours: Temp:  [97.9 F (36.6 C)-98.9 F (37.2 C)] 98.9 F (37.2 C) (02/17 0436) Pulse Rate:  [78-102] 102 (02/17 0436) Resp:  [17-20] 18 (02/17 0436) BP: (133-179)/(68-86) 141/86 (02/17 0436) SpO2:  [96 %-100 %] 100 % (02/17 0436) Last BM Date : 09/23/23  Intake/Output from previous day: 02/16 0701 - 02/17 0700 In: 1600 [P.O.:700; I.V.:800; IV Piggyback:100] Out: 125 [Drains:75; Blood:50] Intake/Output this shift: No intake/output data recorded.  PE: General: pleasant, WD, female who is laying in bed in NAD HEENT: head is normocephalic, atraumatic.  Sclera are noninjected.  Pupils equal and round. EOMs intact.  Ears and nose without any masses or lesions.  Mouth is pink and moist Lungs:   Respiratory effort nonlabored Abd: soft, approp TTP over incisions which are cdi with glue. Drain SS MSK: all 4 extremities are symmetrical with no cyanosis, clubbing, or edema. Skin: warm and dry with no masses, lesions, or rashes Psych: A&Ox3 with an appropriate affect.    Lab Results:  Recent Labs    09/26/23 0512 09/27/23 0554  WBC 7.6 12.6*  HGB 10.3* 9.9*  HCT 29.9* 29.0*  PLT 247 241   BMET Recent Labs    09/26/23 0512 09/27/23 0554  NA 134* 132*  K 4.0 3.9  CL 100 97*  CO2 23 24  GLUCOSE 157* 139*  BUN 12 13  CREATININE 0.94 1.03*  CALCIUM 9.5 9.5   PT/INR No results for input(s): "LABPROT", "INR" in the last 72 hours. CMP     Component Value Date/Time   NA 132 (L) 09/27/2023 0554   K 3.9 09/27/2023 0554   CL 97 (L) 09/27/2023 0554   CO2 24 09/27/2023 0554   GLUCOSE 139 (H) 09/27/2023 0554   BUN 13 09/27/2023 0554   CREATININE 1.03 (H) 09/27/2023 0554   CREATININE 0.92 08/03/2019 0942   CALCIUM 9.5 09/27/2023 0554   PROT 5.8 (L) 09/27/2023 0554    ALBUMIN 2.7 (L) 09/27/2023 0554   AST 29 09/27/2023 0554   AST 16 08/03/2019 0942   ALT 30 09/27/2023 0554   ALT 22 08/03/2019 0942   ALKPHOS 48 09/27/2023 0554   BILITOT 0.7 09/27/2023 0554   BILITOT 0.3 08/03/2019 0942   GFRNONAA 57 (L) 09/27/2023 0554   GFRNONAA >60 08/03/2019 0942   GFRAA >60 08/03/2019 0942   Lipase     Component Value Date/Time   LIPASE 39 09/24/2023 0653       Studies/Results: NM Hepatobiliary Liver Func Result Date: 09/25/2023 CLINICAL DATA:  Right upper quadrant abdominal pain mild gallbladder wall thickening concerning for cholecystitis. EXAM: NUCLEAR MEDICINE HEPATOBILIARY IMAGING TECHNIQUE: Sequential images of the abdomen were obtained out to 60 minutes following intravenous administration of radiopharmaceutical. RADIOPHARMACEUTICALS:  5.1 mCi Tc-50m  Choletec IV (initial) 1.0 mCi Tc-53m Choletec IV (booster prior to morphine administration) 3 mg morphine IV COMPARISON:  Multiple exams, including abdominal ultrasound 09/24/2023 FINDINGS: Satisfactory uptake of radiopharmaceutical from the gallbladder with biliary activity visible at 3 minutes bowel activity 9 minutes. Non filling of the gallbladder of the first 60 minutes. Following administration of 3 mg morphine and additional 1.0 millicurie of Choletec, there is no gallbladder filling at total 90 minutes. IMPRESSION: 1. Lack of filling of the gallbladder despite morphine injection. This implies cystic duct occlusion and favors acute  cholecystitis. 2. Patent common bile duct. Normal hepatocellular uptake of radiopharmaceutical. Electronically Signed   By: Gaylyn Rong M.D.   On: 09/25/2023 11:41    Anti-infectives: Anti-infectives (From admission, onward)    Start     Dose/Rate Route Frequency Ordered Stop   09/25/23 1530  ciprofloxacin (CIPRO) IVPB 400 mg  Status:  Discontinued        400 mg 200 mL/hr over 60 Minutes Intravenous Every 12 hours 09/25/23 1442 09/26/23 1122         Assessment/Plan RUQ pain, R/o acalculous cholecystitis  - + Murphy's sign and + HIDA POD1 s/p laparoscopic cholecystectomy with intraoperative cholangiogram using ICG  Dr. Azucena Cecil 2/16   - advance to solid diet - encouraged ambulation - JP drain SS - can be removed prior to dc if remains SS after solid diet - can discharge this pm if tolerates diet and drain remains SS  FEN: vegetarian ID: cipro periop VTE: SCDs   Due to language barrier, an interpreter was present during the history-taking and subsequent discussion (and for part of the physical exam) with this patient. ID# [id number]   LOS: 2 days   Eric Form, [facility] 09/27/2023, 8:02 AM Please see Amion for pager number during day hours 7:00am-4:30pm

## 2023-09-28 LAB — SURGICAL PATHOLOGY

## 2023-09-30 LAB — SJOGRENS SYNDROME-B EXTRACTABLE NUCLEAR ANTIBODY: SSB (La) (ENA) Antibody, IgG: <0.2 AI (ref 0.0–0.9)

## 2023-09-30 LAB — SJOGRENS SYNDROME-A EXTRACTABLE NUCLEAR ANTIBODY: SSA (Ro) (ENA) Antibody, IgG: <0.2 AI (ref 0.0–0.9)

## 2023-10-07 ENCOUNTER — Ambulatory Visit: Payer: Medicare Other

## 2023-10-08 NOTE — Progress Notes (Signed)
 Coding Query Patient: Gloria Mckinney MRN: [id number] DOB: 31-Oct-1945  Unavoidable liver tear of the capsule altered course of surgery due to use of fibrin gauze but did not overall alter length of surgery.  Donata Duff, MD [facility]

## 2023-10-15 ENCOUNTER — Inpatient Hospital Stay: Admit: 2023-10-15 | Discharge: 2023-10-15 | Payer: MEDICARE | Primary: Internal Medicine

## 2023-10-15 DIAGNOSIS — Z780 Asymptomatic menopausal state: Secondary | ICD-10-CM

## 2023-10-15 DIAGNOSIS — Z13820 Encounter for screening for osteoporosis: Secondary | ICD-10-CM

## 2023-10-15 DIAGNOSIS — M8589 Other specified disorders of bone density and structure, multiple sites: Secondary | ICD-10-CM

## 2023-10-15 DIAGNOSIS — M810 Age-related osteoporosis without current pathological fracture: Secondary | ICD-10-CM

## 2023-10-19 ENCOUNTER — Encounter: Admit: 2023-10-19 | Payer: PRIVATE HEALTH INSURANCE | Attending: Internal Medicine | Primary: Internal Medicine

## 2023-10-19 NOTE — Other
 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  Medical therapy may be an option if patient agrees.  For now, calcium with vitamin-D and weight-bearing exercises advised.

## 2023-10-21 ENCOUNTER — Ambulatory Visit
Admission: RE | Admit: 2023-10-21 | Discharge: 2023-10-21 | Disposition: A | Discharge status: Home / Self Care | Source: Ambulatory Visit | Attending: Family Medicine | Admitting: Family Medicine

## 2023-10-21 DIAGNOSIS — Z1231 Encounter for screening mammogram for malignant neoplasm of breast: Secondary | ICD-10-CM

## 2023-10-22 ENCOUNTER — Ambulatory Visit

## 2023-10-22 ENCOUNTER — Encounter: Admit: 2023-10-22 | Payer: PRIVATE HEALTH INSURANCE | Primary: Internal Medicine

## 2023-10-22 NOTE — Other
 LMTCO.

## 2023-10-22 NOTE — Other
 Mychart message sent.

## 2023-10-25 ENCOUNTER — Encounter: Admit: 2023-10-25 | Payer: PRIVATE HEALTH INSURANCE | Attending: Internal Medicine | Primary: Internal Medicine

## 2023-10-25 ENCOUNTER — Telehealth: Admit: 2023-10-25 | Payer: PRIVATE HEALTH INSURANCE | Attending: Internal Medicine | Primary: Internal Medicine

## 2023-10-25 MED ORDER — ASCORBIC ACID (VITAMIN C) 500 MG TABLET
500 | ORAL_TABLET | Freq: Every day | ORAL | 5 refills | 50.00000 days | Status: AC
Start: 2023-10-25 — End: ?

## 2023-10-25 MED ORDER — CHOLECALCIFEROL (VITAMIN D3) 50 MCG (2,000 UNIT) TABLET
50 | ORAL_TABLET | Freq: Every day | ORAL | 5 refills | 90.00000 days | Status: AC
Start: 2023-10-25 — End: ?

## 2023-10-29 ENCOUNTER — Ambulatory Visit: Payer: Medicare Other

## 2023-12-07 ENCOUNTER — Encounter: Admit: 2023-12-07 | Payer: PRIVATE HEALTH INSURANCE | Attending: Internal Medicine | Primary: Internal Medicine

## 2023-12-07 DIAGNOSIS — E119 Type 2 diabetes mellitus without complications: Secondary | ICD-10-CM

## 2023-12-07 MED ORDER — METFORMIN IMMEDIATE RELEASE 850 MG TABLET
850 | ORAL_TABLET | ORAL | 5 refills | 90.00000 days | Status: AC
Start: 2023-12-07 — End: ?

## 2023-12-07 MED ORDER — LISINOPRIL 20 MG-HYDROCHLOROTHIAZIDE 12.5 MG TABLET
20-12.5 | ORAL_TABLET | ORAL | 5 refills | 90.00000 days | Status: AC
Start: 2023-12-07 — End: ?

## 2023-12-07 MED ORDER — SIMVASTATIN 20 MG TABLET
20 | ORAL_TABLET | ORAL | 5 refills | 90.00000 days | Status: AC
Start: 2023-12-07 — End: ?

## 2023-12-21 ENCOUNTER — Encounter: Payer: Self-pay | Admitting: Cardiology

## 2023-12-21 ENCOUNTER — Ambulatory Visit: Attending: Cardiology | Admitting: Cardiology

## 2023-12-21 VITALS — BP 195/78 | HR 76 | Ht 62.0 in | Wt 178.0 lb

## 2023-12-21 DIAGNOSIS — E7800 Pure hypercholesterolemia, unspecified: Secondary | ICD-10-CM | POA: Insufficient documentation

## 2023-12-21 DIAGNOSIS — I1A0 Resistant hypertension: Secondary | ICD-10-CM | POA: Insufficient documentation

## 2023-12-21 DIAGNOSIS — E1122 Type 2 diabetes mellitus with diabetic chronic kidney disease: Secondary | ICD-10-CM | POA: Diagnosis present

## 2023-12-21 DIAGNOSIS — N1831 Chronic kidney disease, stage 3a: Secondary | ICD-10-CM | POA: Diagnosis present

## 2023-12-21 DIAGNOSIS — R600 Localized edema: Secondary | ICD-10-CM | POA: Diagnosis present

## 2023-12-21 MED ORDER — NEBIVOLOL HCL 20 MG PO TABS: 20.0000 mg | ORAL_TABLET | Freq: Every day | ORAL | 2 refills | Status: DC

## 2023-12-21 MED ORDER — AMLODIPINE BESYLATE 5 MG PO TABS: 5.0000 mg | ORAL_TABLET | Freq: Every day | ORAL | Status: DC

## 2023-12-21 NOTE — Progress Notes (Signed)
 " Cardiology Office Note:  .   Date:  12/22/2023  ID:  Gloria Mckinney, DOB Dec 10, 1945, MRN [id number] PCP: Verena Mems, MD  [facility] Providers Cardiologist:  Gordy Bergamo, MD   History of Present Illness: .   Gloria Mckinney is a 78 y.o. Asian Indian female patient with hypertension, hypercholesterolemia, diabetes mellitus, peripheral arterial disease, breast cancer SP left breast lumpectomy followed by chemo and XRT therapy in 2020 referred to me for evaluation of difficult to control hypertension.  Discussed the use of AI scribe software for clinical note transcription with the patient, who gave verbal consent to proceed.  History of Present Illness Gloria Mckinney is a 78 year old female with hypertension who presents with uncontrolled blood pressure and medication side effects. She is accompanied by her daughter.  Her blood pressure fluctuates with readings as high as 200 mmHg despite medication adjustments. She is on amlodipine  10 mg, which causes significant leg swelling. The swelling decreases when blood pressure medication dose is reduced, control remains inadequate.  She experiences sleep disturbances, including nocturnal gasping for air, and has been provided with a CPAP machine, which she finds uncomfortable and uses inconsistently.  She previously used Ozempic for weight management but discontinued it due to an episode of appendicitis and also cholecystitis attributing this to be the side effect.  Labs   Lab Results  Component Value Date   NA 132 (L) 09/27/2023   K 3.9 09/27/2023   CO2 24 09/27/2023   GLUCOSE 139 (H) 09/27/2023   BUN 13 09/27/2023   CREATININE 1.03 (H) 09/27/2023   CALCIUM 9.5 09/27/2023   GFRNONAA 57 (L) 09/27/2023      Latest Ref Rng & Units 09/27/2023    5:54 AM 09/26/2023    5:12 AM 09/25/2023    7:02 AM  BMP  Glucose 70 - 99 mg/dL 860  842  864   BUN 8 - 23 mg/dL 13  12  13    Creatinine 0.44 -  1.00 mg/dL 8.96  9.05  8.94   Sodium 135 - 145 mmol/L 132  134  134   Potassium 3.5 - 5.1 mmol/L 3.9  4.0  3.7   Chloride 98 - 111 mmol/L 97  100  100   CO2 22 - 32 mmol/L 24  23  23    Calcium 8.9 - 10.3 mg/dL 9.5  9.5  9.5       Latest Ref Rng & Units 09/27/2023    5:54 AM 09/26/2023    5:12 AM 09/25/2023    7:02 AM  CBC  WBC 4.0 - 10.5 K/uL 12.6  7.6  9.2   Hemoglobin 12.0 - 15.0 g/dL 9.9  89.6  89.2   Hematocrit 36.0 - 46.0 % 29.0  29.9  31.9   Platelets 150 - 400 K/uL 241  247  264    Lab Results  Component Value Date   HGBA1C 7.3 (H) 09/25/2023    External Labs:  PCP labs: K PN  Labs 03/30/2023:  Total cholesterol 169, triglycerides 114, HDL 59, LDL 90.  TSH normal at 1.790.  ROS  Review of Systems  Cardiovascular:  Negative for chest pain, dyspnea on exertion and leg swelling.    Physical Exam:   VS:  BP (!) 195/78   Pulse 76   Ht 5' 2 (1.575 m)   Wt 178 lb (80.7 kg)   SpO2 95%   BMI 32.56 kg/m    Wt Readings from Last 3 Encounters:  12/21/23 178 lb (80.7 kg)  09/24/23 170 lb (77.1 kg)  03/19/23 175 lb 8 oz (79.6 kg)    Physical Exam Constitutional:      Appearance: She is obese.  Neck:     Vascular: No carotid bruit or JVD.  Cardiovascular:     Rate and Rhythm: Normal rate and regular rhythm.     Pulses: Intact distal pulses.     Heart sounds: Normal heart sounds. No murmur heard.    No gallop.  Pulmonary:     Effort: Pulmonary effort is normal.     Breath sounds: Normal breath sounds.  Abdominal:     General: Bowel sounds are normal.     Palpations: Abdomen is soft.  Musculoskeletal:     Right lower leg: No edema.     Left lower leg: No edema.    Studies Reviewed: .    CT angiogram of the chest, abdomen 09/24/2023: Coronary artery calcification, aortic atherosclerosis. Normal caliber aorta without aneurysm or dissection. Bilateral renal arteries are widely patent.  EKG:    EKG Interpretation Date/Time:  Tuesday Dec 21 2023 14:17:24  EDT Ventricular Rate:  75 PR Interval:  180 QRS Duration:  86 QT Interval:  388 QTC Calculation: 433 R Axis:   -2  Text Interpretation: EKG 12/21/2023: Normal sinus rhythm at rate of 75 bpm, poor R wave progression, probably normal variant, cannot exclude anterior infarct old. Borderline criteria for LVH by voltage in aVL.  Compared to 09/24/2023, no significant change. Confirmed by Yuleimy Kretz, Jagadeesh ([id number]) on 12/21/2023 2:26:48 PM    Medications and allergies    Allergies  Allergen Reactions   Penicillins Swelling and Other (See Comments)    Has patient had a PCN reaction causing immediate rash, facial/tongue/throat swelling, SOB or lightheadedness with hypotension: Yes Has patient had a PCN reaction causing severe rash involving mucus membranes or skin necrosis: No Has patient had a PCN reaction that required hospitalization: No Has patient had a PCN reaction occurring within the last 10 years: No If all of the above answers are NO, then may proceed with Cephalosporin use.    Cephalexin Itching   Latex Swelling   Statins Other (See Comments)    MYALGIAS     Current Outpatient Medications:    acetaminophen  (TYLENOL ) 325 MG tablet, Take 2 tablets (650 mg total) by mouth every 6 (six) hours as needed for mild pain (pain score 1-3) (or Fever >/= 101)., Disp: , Rfl:    anastrozole  (ARIMIDEX ) 1 MG tablet, Take 1 tablet (1 mg total) by mouth daily., Disp: 90 tablet, Rfl: 3   aspirin  EC 81 MG tablet, Take 81 mg by mouth daily., Disp: , Rfl:    Calcium Carb-Cholecalciferol (CALCIUM + VITAMIN D3 PO), Take 1 tablet by mouth daily., Disp: , Rfl:    glimepiride  (AMARYL ) 4 MG tablet, Take 4 mg by mouth 2 (two) times daily., Disp: , Rfl:    insulin  degludec (TRESIBA) 100 UNIT/ML FlexTouch Pen, Inject 30 Units into the skin daily., Disp: , Rfl:    metFORMIN (GLUCOPHAGE) 1000 MG tablet, Take 1,000 mg by mouth 2 (two) times daily with a meal., Disp: , Rfl:    Nebivolol  HCl (BYSTOLIC ) 20 MG TABS,  Take 1 tablet (20 mg total) by mouth daily., Disp: 30 tablet, Rfl: 2   omeprazole (PRILOSEC) 40 MG capsule, Take 40 mg by mouth every morning. , Disp: , Rfl:    OZEMPIC, 0.25 OR 0.5 MG/DOSE, 2 MG/3ML SOPN, Inject 0.5 mg into the  skin once a week., Disp: , Rfl:    simvastatin  (ZOCOR ) 40 MG tablet, Take 40 mg by mouth daily., Disp: , Rfl:    valsartan -hydrochlorothiazide  (DIOVAN -HCT) 320-12.5 MG tablet, Take 1 tablet by mouth daily., Disp: 30 tablet, Rfl: 2   amLODipine  (NORVASC ) 5 MG tablet, Take 1 tablet (5 mg total) by mouth daily., Disp: , Rfl:    Meds ordered this encounter  Medications   amLODipine  (NORVASC ) 5 MG tablet    Sig: Take 1 tablet (5 mg total) by mouth daily.   Nebivolol  HCl (BYSTOLIC ) 20 MG TABS    Sig: Take 1 tablet (20 mg total) by mouth daily.    Dispense:  30 tablet    Refill:  2     Medications Discontinued During This Encounter  Medication Reason   docusate sodium  (COLACE) 100 MG capsule Patient Preference   sodium chloride  flush (NS) 0.9 % injection 10 mL    amLODipine  (NORVASC ) 10 MG tablet    carvedilol  (COREG ) 3.125 MG tablet Change in therapy     ASSESSMENT AND PLAN: .      ICD-10-CM   1. Resistant hypertension  I1A.0 EKG 12-Lead    amLODipine  (NORVASC ) 5 MG tablet    Nebivolol  HCl (BYSTOLIC ) 20 MG TABS    ECHOCARDIOGRAM COMPLETE    2. Pure hypercholesterolemia  E78.00     3. Type 2 diabetes mellitus with stage 3a chronic kidney disease, without long-term current use of insulin  (HCC)  E11.22    N18.31     4. Lower extremity edema  R60.0 ECHOCARDIOGRAM COMPLETE      Assessment and Plan Assessment & Plan Hypertension   Her hypertension remains uncontrolled with blood pressure between 195 and 200 mmHg. Previous medication adjustments were insufficient. Amlodipine  at 10 mg caused significant leg edema, necessitating a dosage reduction.  Contributing factors include sleep apnea, obesity, and Indian descent, increasing cardiovascular risk.  Non-compliance with CPAP may contribute to elevated blood pressure. Education on CPAP use for cardiovascular risk reduction and quality of life improvement was provided. Switch carvedilol  3.125 mg to Bystolic  20 mg once daily. Reduce amlodipine  to 5 mg once daily. Encourage CPAP use to aid in blood pressure control. Advise on reducing salt intake. Encourage weight loss through lifestyle changes, including exercise and dietary modifications. Continue valsartan  HCT 320/12.5 mg in the morning, could consider spironolactone  however needs close monitoring of her sodium levels.  Sleep apnea   Sleep apnea is suspected to contribute to uncontrolled hypertension. She is non-compliant with CPAP due to discomfort and claustrophobia. Education on CPAP use for cardiovascular risk reduction and quality of life improvement was provided. Encourage consistent use of CPAP machine to manage sleep apnea and aid in blood pressure control.  Leg edema due to amlodipine    Leg edema is attributed to amlodipine  use at 10 mg, causing discomfort and dark pigmentation from bleeding in the skin. Support stockings and leg elevation were recommended to manage symptoms. Reduce amlodipine  to 5 mg once daily. Advise wearing support stockings within 30 minutes of waking to reduce edema. Encourage leg elevation when sitting to minimize swelling.  Obesity   Obesity contributes to hypertension and knee issues. She discontinued Ozempic due to gastrointestinal side effects and concerns about gallbladder issues. Education on proper Ozempic use for weight management and its benefits for blood pressure and knee health was provided. Emphasis on slow eating and portion control to minimize gastrointestinal side effects. Consider restarting Ozempic at 0.25 mg with education on slow eating and portion  control to minimize gastrointestinal side effects. Encourage regular physical activity, such as cycling or using an elliptical, to aid in weight loss and  improve cardiovascular health.  Office visit in 6 to 8 weeks.  Signed,  Gordy Bergamo, MD, [facility] 12/22/2023, 7:40 AM [facility] [address] [place], [place] [address] Phone: [phone]. Fax:  [phone]  "

## 2023-12-21 NOTE — Patient Instructions (Addendum)
 Medication Instructions:  Your physician has recommended you make the following change in your medication:  Stop carvedilol  Start bystolic 20 mg by mouth daily  Start Amlodipine  5 mg by mouth daily  *If you need a refill on your cardiac medications before your next appointment, please call your pharmacy*  Lab Work: none If you have labs (blood work) drawn today and your tests are completely normal, you will receive your results only by: MyChart Message (if you have MyChart) OR A paper copy in the mail If you have any lab test that is abnormal or we need to change your treatment, we will call you to review the results.  Testing/Procedures: Your physician has requested that you have an echocardiogram. Echocardiography is a painless test that uses sound waves to create images of your heart. It provides your doctor with information about the size and shape of your heart and how well your heart's chambers and valves are working. This procedure takes approximately one hour. There are no restrictions for this procedure. Please do NOT wear cologne, perfume, aftershave, or lotions (deodorant is allowed). Please arrive 15 minutes prior to your appointment time.  Please note: We ask at that you not bring children with you during ultrasound (echo/ vascular) testing. Due to room size and safety concerns, children are not allowed in the ultrasound rooms during exams. Our front office staff cannot provide observation of children in our lobby area while testing is being conducted. An adult accompanying a patient to their appointment will only be allowed in the ultrasound room at the discretion of the ultrasound technician under special circumstances. We apologize for any inconvenience.   Follow-Up: At [facility], you and your health needs are our priority.  As part of our continuing mission to provide you with exceptional heart care, our providers are all part of one team.  This team includes your  primary Cardiologist (physician) and Advanced Practice Providers or APPs (Physician Assistants and Nurse Practitioners) who all work together to provide you with the care you need, when you need it.  Your next appointment:   June 30 at 11 AM  Provider:   Knox Perl, MD    We recommend signing up for the patient portal called "MyChart".  Sign up information is provided on this After Visit Summary.  MyChart is used to connect with patients for Virtual Visits (Telemedicine).  Patients are able to view lab/test results, encounter notes, upcoming appointments, etc.  Non-urgent messages can be sent to your provider as well.   To learn more about what you can do with MyChart, go to [email or web address].   Other Instructions

## 2023-12-27 ENCOUNTER — Telehealth: Payer: Self-pay | Admitting: Cardiology

## 2023-12-27 NOTE — Telephone Encounter (Signed)
   Name: Ty Oshima  DOB: [birth year]  MRN: [id number]  Primary Cardiologist: Knox Perl, MD  Chart reviewed as part of pre-operative protocol coverage. The patient has an upcoming visit scheduled with Dr. Berry Bristol on 02/07/2024 at which time clearance can be addressed in case there are any issues that would impact surgical recommendations.  Patient last seen in office on 12/21/2023 for resistant hypertension.  Blood pressure was 195/78 and medication changes were made.  Noted to be noncompliant with CPAP.  She will need to follow back up in office for blood pressure recheck prior to clearance.  Right knee revision is not scheduled until TBD as below. I added preop FYI to appointment note so that provider is aware to address at time of outpatient visit.  Per office protocol the cardiology provider should forward their finalized clearance decision and recommendations regarding antiplatelet therapy to the requesting party below.    Ideally aspirin  should be continued without interruption, however if the bleeding risk is too great, aspirin  may be held for 5-7 days prior to surgery. Please resume aspirin  post operatively when it is felt to be safe from a bleeding standpoint.    I will route this message as FYI to requesting party and remove this message from the preop box as separate preop APP input not needed at this time.   Please call with any questions.  Ava Boatman, NP  12/27/2023, 3:50 PM

## 2023-12-27 NOTE — Telephone Encounter (Signed)
   Pre-operative Risk Assessment    Patient Name: Gloria Mckinney  DOB: February 11, 1946 MRN: [id number]   Date of last office visit:  Date of next office visit:    Request for Surgical Clearance    Procedure:  Right Knee Revision  Date of Surgery:  Pending                               Surgeon:  Dr Neil Balls Surgeon's Group or Practice Name:   Phone number:  [phone] Fax number: [phone]     Type of Clearance Requested: medical and medicine- unclear about medicine      Type of Anesthesia:  Spinal   Additional requests/questions:    Signed, Shela Derby   12/27/2023, 3:25 PM

## 2023-12-27 NOTE — Telephone Encounter (Signed)
 Last office visit : 12/21/23 J. Berry Bristol, MD Next office : 01/31/24 Echo

## 2024-01-12 ENCOUNTER — Encounter: Payer: Self-pay | Admitting: Cardiology

## 2024-01-14 ENCOUNTER — Ambulatory Visit (HOSPITAL_COMMUNITY)
Admission: RE | Admit: 2024-01-14 | Discharge: 2024-01-14 | Disposition: A | Discharge status: Home / Self Care | Source: Ambulatory Visit | Attending: Cardiology | Admitting: Cardiology

## 2024-01-14 DIAGNOSIS — I1A0 Resistant hypertension: Secondary | ICD-10-CM | POA: Diagnosis present

## 2024-01-14 DIAGNOSIS — R600 Localized edema: Secondary | ICD-10-CM | POA: Diagnosis not present

## 2024-01-14 LAB — ECHOCARDIOGRAM COMPLETE
Area-P 1/2: 3.76 cm2
S' Lateral: 2.3 cm

## 2024-01-15 ENCOUNTER — Ambulatory Visit: Payer: Self-pay | Admitting: Cardiology

## 2024-01-15 NOTE — Progress Notes (Signed)
 Normal LVEF. NO significant valve issues

## 2024-01-21 ENCOUNTER — Ambulatory Visit: Admitting: Cardiology

## 2024-01-25 ENCOUNTER — Other Ambulatory Visit: Payer: Self-pay | Admitting: *Deleted

## 2024-01-25 ENCOUNTER — Encounter: Payer: Self-pay | Admitting: Cardiology

## 2024-01-25 ENCOUNTER — Ambulatory Visit: Attending: Cardiology | Admitting: Cardiology

## 2024-01-25 VITALS — BP 134/72 | HR 67 | Resp 16 | Ht 62.0 in | Wt 177.4 lb

## 2024-01-25 DIAGNOSIS — Z01810 Encounter for preprocedural cardiovascular examination: Secondary | ICD-10-CM | POA: Diagnosis present

## 2024-01-25 DIAGNOSIS — E1122 Type 2 diabetes mellitus with diabetic chronic kidney disease: Secondary | ICD-10-CM | POA: Diagnosis not present

## 2024-01-25 DIAGNOSIS — N1831 Chronic kidney disease, stage 3a: Secondary | ICD-10-CM | POA: Insufficient documentation

## 2024-01-25 DIAGNOSIS — I1A0 Resistant hypertension: Secondary | ICD-10-CM | POA: Insufficient documentation

## 2024-01-25 MED ORDER — NEBIVOLOL HCL 20 MG PO TABS: 20.0000 mg | ORAL_TABLET | Freq: Every day | ORAL | 2 refills | Status: DC

## 2024-01-25 MED ORDER — VALSARTAN-HYDROCHLOROTHIAZIDE 160-12.5 MG PO TABS: 1.0000 | ORAL_TABLET | Freq: Every day | ORAL | Status: DC

## 2024-01-25 NOTE — Patient Instructions (Addendum)
 Medication Instructions:  Your physician has recommended you make the following change in your medication:  Start Valsartan  hydrochlorothiazide  160/12.5 mg by mouth daily     *If you need a refill on your cardiac medications before your next appointment, please call your pharmacy*  Lab Work: Have lab work (BMP) checked tomorrow at [facility] Have lab work (BMP) checked in 3 weeks at [facility]. Can be done at any [facility] location.  There is a location on the first floor of our building If you have labs (blood work) drawn today and your tests are completely normal, you will receive your results only by: MyChart Message (if you have MyChart) OR A paper copy in the mail If you have any lab test that is abnormal or we need to change your treatment, we will call you to review the results.  Testing/Procedures: none  Follow-Up: At [facility], you and your health needs are our priority.  As part of our continuing mission to provide you with exceptional heart care, our providers are all part of one team.  This team includes your primary Cardiologist (physician) and Advanced Practice Providers or APPs (Physician Assistants and Nurse Practitioners) who all work together to provide you with the care you need, when you need it.  Your next appointment:   September 24 at 3:40  Provider:   Knox Perl, MD    We recommend signing up for the patient portal called MyChart.  Sign up information is provided on this After Visit Summary.  MyChart is used to connect with patients for Virtual Visits (Telemedicine).  Patients are able to view lab/test results, encounter notes, upcoming appointments, etc.  Non-urgent messages can be sent to your provider as well.   To learn more about what you can do with MyChart, go to [email or web address].   Other Instructions

## 2024-01-25 NOTE — Progress Notes (Signed)
 Cardiology Office Note:  .   Date:  01/25/2024  ID:  Gloria Mckinney, DOB 03-Sep-1945, MRN [id number] PCP: Olin Bertin, MD  [facility] Providers Cardiologist:  Knox Perl, MD   History of Present Illness: .   Gloria Mckinney is a 78 y.o. Asian [place] female patient with hypertension, hypercholesterolemia, diabetes mellitus, peripheral arterial disease, breast cancer SP left breast lumpectomy followed by chemo and XRT therapy in 2020 presents for management of resident hypertension and also preoperative cardiac risk evaluation with for upcoming right knee revision under spinal anesthesia by Dr. Neil Balls.  On her last office visit on 12/21/2023 and reduced the dose of amlodipine  from 10 mg to 5 mg in view of leg edema and started her on 20 mg of Bystolic .  Patient had called us  stating that her leg edema is still present hence reduced to 2.5 mg daily.  She now presents for follow-up. Confused about medications. Valsartan  HCT 320/12.5 mg dropped. Multiple medication list present.   Discussed the use of AI scribe software for clinical note transcription with the patient, who gave verbal consent to proceed.  History of Present Illness Gloria Mckinney is a 78 year old female with hypertension and diabetes who presents with concerns about leg swelling and medication management of hypertension.  She remains asymptomatic, continues to remain fairly active in spite of arthritis with no chest pain or dyspnea.  She experiences fluctuating blood pressure readings at home, with a recent measurement of 172/92 mmHg. She adjusts her medication dosage between 2.5 mg and 5 mg to manage her blood pressure, which is mostly controlled but occasionally rises. She takes amlodipine  and nebivolol  for blood pressure management.   Persistent leg swelling is present, which she associates with her blood pressure medication. The swelling does not improve with reducing  Amlodipine  to 5 mg dose of her medication hence further reduced to 2.5 mg daily. She also experiences a lack of energy, impacting her ability to stand for long periods. She takes furosemide 20 mg daily for edema.  She started Ozempic about one month ago at a dose of 0.25 mg but has not increased the dose due to concerns about tolerability. She wants to lose weight, noting a recent weight gain of 10 pounds over the last two months. She is currently taking insulin  at a dose of 40 units daily and metformin, with a goal to reduce or discontinue these medications.  Labs   Lab Results  Component Value Date   NA 132 (L) 09/27/2023   K 3.9 09/27/2023   CO2 24 09/27/2023   GLUCOSE 139 (H) 09/27/2023   BUN 13 09/27/2023   CREATININE 1.03 (H) 09/27/2023   CALCIUM 9.5 09/27/2023   GFRNONAA 57 (L) 09/27/2023      Latest Ref Rng & Units 09/27/2023    5:54 AM 09/26/2023    5:12 AM 09/25/2023    7:02 AM  BMP  Glucose 70 - 99 mg/dL 440  347  425   BUN 8 - 23 mg/dL 13  12  13    Creatinine 0.44 - 1.00 mg/dL 9.56  3.87  5.64   Sodium 135 - 145 mmol/L 132  134  134   Potassium 3.5 - 5.1 mmol/L 3.9  4.0  3.7   Chloride 98 - 111 mmol/L 97  100  100   CO2 22 - 32 mmol/L 24  23  23    Calcium 8.9 - 10.3 mg/dL 9.5  9.5  9.5  Latest Ref Rng & Units 09/27/2023    5:54 AM 09/26/2023    5:12 AM 09/25/2023    7:02 AM  CBC  WBC 4.0 - 10.5 K/uL 12.6  7.6  9.2   Hemoglobin 12.0 - 15.0 g/dL 9.9  16.1  09.6   Hematocrit 36.0 - 46.0 % 29.0  29.9  31.9   Platelets 150 - 400 K/uL 241  247  264    Lab Results  Component Value Date   HGBA1C 7.3 (H) 09/25/2023   External Labs:  PCP labs: K PN   Labs 03/30/2023:   Total cholesterol 169, triglycerides 114, HDL 59, LDL 90.   TSH normal at 1.790.  ROS  Review of Systems  Constitutional: Positive for malaise/fatigue.  Cardiovascular:  Positive for leg swelling. Negative for chest pain and dyspnea on exertion.    Physical Exam:   VS:  BP 134/72 (BP  Location: Right Arm, Patient Position: Sitting, Cuff Size: Normal)   Pulse 67   Resp 16   Ht 5' 2 (1.575 m)   Wt 177 lb 6.4 oz (80.5 kg)   SpO2 97%   BMI 32.45 kg/m    Wt Readings from Last 3 Encounters:  01/25/24 177 lb 6.4 oz (80.5 kg)  12/21/23 178 lb (80.7 kg)  09/24/23 170 lb (77.1 kg)    Physical Exam Constitutional:      Appearance: She is obese.  Neck:     Vascular: No carotid bruit or JVD.   Cardiovascular:     Rate and Rhythm: Normal rate and regular rhythm.     Pulses: Intact distal pulses.     Heart sounds: Normal heart sounds. No murmur heard.    No gallop.  Pulmonary:     Effort: Pulmonary effort is normal.     Breath sounds: Normal breath sounds.  Abdominal:     General: Bowel sounds are normal.     Palpations: Abdomen is soft.   Musculoskeletal:     Right lower leg: Edema present.     Left lower leg: Edema present.    Studies Reviewed: Aaron Aas    ECHOCARDIOGRAM COMPLETE 01/14/2024  1. Left ventricular ejection fraction, by estimation, is 60 to 65%. Left ventricular ejection fraction by 3D volume is 68 %. The left ventricle has normal function. The left ventricle has no regional wall motion abnormalities. There is mild concentric left ventricular hypertrophy. Left ventricular diastolic parameters are consistent with Grade I diastolic dysfunction (impaired relaxation). 2. Right ventricular systolic function is normal. The right ventricular size is normal. 3. Left atrial size was mildly dilated. 4. The mitral valve is normal in structure. Mild mitral valve regurgitation. No evidence of mitral stenosis. 5. The aortic valve is normal in structure. There is mild calcification of the aortic valve. Aortic valve regurgitation is not visualized. Aortic valve sclerosis/calcification is present, without any evidence of aortic stenosis. 6. The inferior vena cava is normal in size with greater than 50% respiratory variability, suggesting right atrial pressure of 3  mmHg.  EKG:        Medications ordered    Meds ordered this encounter  Medications   Nebivolol  HCl (BYSTOLIC ) 20 MG TABS    Sig: Take 1 tablet (20 mg total) by mouth daily.    Dispense:  90 tablet    Refill:  2   valsartan -hydrochlorothiazide  (DIOVAN  HCT) 160-12.5 MG tablet    Sig: Take 1 tablet by mouth daily.     ASSESSMENT AND PLAN: .  ICD-10-CM   1. 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 Preoperative cardiovascular examination  Z01.810 Basic Metabolic Panel (BMET)    Basic Metabolic Panel (BMET)    3. Type 2 diabetes mellitus with stage 3a chronic kidney disease, without long-term current use of insulin  (HCC)  E11.22    N18.31      Assessment & Plan Hypertension Blood pressure is generally well-controlled with occasional elevations. Current regimen includes amlodipine  2.5 mg daily, Lasix 20 mg daily and nebivolol  20 mg daily. Previous use of valsartan  HCT 320/12.5 mg in the morning was discontinued.  Previously she had not mentioned about furosemide either.   - Advise to monitor blood pressure once or twice a week to reduce anxiety-related elevations. - Reintroduce valsartan  HCT 160/12.5 mg that she has available at home. - Encourage weight loss to potentially reduce the need for antihypertensive medications. - BMP tomorrow for baseline and again in 2-3 weeks - Extensive discussion to maintain medication list and to avoid confusion.  Diabetes mellitus, type 2 Currently managed with insulin  and metformin. Transition to Ozempic monotherapy to reduce insulin  and metformin use. Weight loss can improve glycemic control. Ozempic is used even in non-diabetic patients and can help reduce the need for insulin  and metformin. - Increase Ozempic dose to 0.5 mg for 2-3 weeks, then consider increasing to 1 mg. - Monitor blood glucose closely and adjust insulin  dosage accordingly. -  Aim to discontinue insulin  and metformin with successful Ozempic use and weight loss. - Weight loss can normalize blood pressure and reduce the need for antihypertensive medications.  Leg swelling Persistent leg swelling. Current management includes furosemide 20 mg daily. Weight loss and increased Ozempic dosage may reduce leg swelling. - Continue furosemide 20 mg daily. - Increase Ozempic dose to 0.5 mg for 2-3 weeks, then consider increasing to 1 mg.  Preoperative cardiac risk stratification Patient is scheduled for right knee revision under spinal anesthesia by Dr. Neil Balls.  As of blood pressure is much improved, she can be taken up for the upcoming surgery with low risk.  Although she has multiple cardiovascular risk factors including diabetes, hypertension, hypercholesterolemia and obesity and age, she continues to remain active and has at least 7 metabolic equivalents of physical activity hence do not think she needs stress testing prior to surgery hence can be taken up for the surgery with low risk.  Office visit in 3 months.  40-minute office visit, reconciliation of medications, discussions regarding management of hypertension and diabetes and counseling regarding weight loss.  Daughter is present and all questions answered.  Signed,  Knox Perl, MD, [facility] 01/25/2024, 9:23 PM [facility] [address] [place], [place] [address] Phone: [phone]. Fax:  [phone]

## 2024-01-27 ENCOUNTER — Ambulatory Visit: Payer: Self-pay | Admitting: Cardiology

## 2024-01-27 LAB — BASIC METABOLIC PANEL WITH GFR
BUN/Creatinine Ratio: 14 (ref 12–28)
BUN: 14 mg/dL (ref 8–27)
CO2: 20 mmol/L (ref 20–29)
Calcium: 10.1 mg/dL (ref 8.7–10.3)
Chloride: 99 mmol/L (ref 96–106)
Creatinine, Ser: 1.02 mg/dL — ABNORMAL HIGH (ref 0.57–1.00)
Glucose: 128 mg/dL — ABNORMAL HIGH (ref 70–99)
Potassium: 5.3 mmol/L — ABNORMAL HIGH (ref 3.5–5.2)
Sodium: 136 mmol/L (ref 134–144)
eGFR: 57 mL/min/{1.73_m2} — ABNORMAL LOW (ref >59)

## 2024-01-31 ENCOUNTER — Other Ambulatory Visit (HOSPITAL_COMMUNITY)

## 2024-02-07 ENCOUNTER — Ambulatory Visit: Admitting: Cardiology

## 2024-02-14 ENCOUNTER — Telehealth: Payer: Self-pay | Admitting: *Deleted

## 2024-02-14 NOTE — Telephone Encounter (Signed)
 Pt daughter called stating that she wanted to change office visit to telephone visit. Visits were changed to phone visit and pt daughter is aware of time.

## 2024-02-16 NOTE — Assessment & Plan Note (Signed)
 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   Plan: 1. Adjuvant Taxol  Herceptin  weekly x12 completed 11/28/2018 followed by Herceptin  maintenance for 1 year to be completed 08/03/2019 2.  Adjuvant radiation therapy completed 02/13/2019 3.  Followed by adjuvant antiestrogen therapy to start January 2021 --------------------------------------------------------------------------------------------------------------------------------------------------------- Current treatment: Adjuvant Herceptin  completed 08/03/2019, adjuvant anastrozole  started January 2021 Adjuvant radiation completed 02/13/2019 Echocardiogram 11/29/2018: EF 55 to 60%   Iron deficiency anemia:  IV iron given 04/24/2019 currently on oral iron which will be completed in December 2020 hemoglobin improved to 13.7   Anastrozole  toxicities: tolerating it well. No side effects Dryness of Mouth: Currently using Biotene   Breast cancer surveillance: Mammogram: 10/26/23: Benign breast density category C Breast exam 02/17/2024: Benign CT abdomen: 12/02/2022: Appendicitis status post appendectomy MRI lumbar spine 01/16/2023: Degeneration with spinal stenosis   She underwent knee replacement surgery in August 2023 but she is still having stiffness and achiness in the knee..  Muscle aches and pains: I discussed with her about taking glucosamine/chondroitin or magnesium supplementation.   Return to clinic in 1 year for follow-up

## 2024-02-17 ENCOUNTER — Inpatient Hospital Stay: Payer: Medicare Other | Attending: Hematology and Oncology | Admitting: Hematology and Oncology

## 2024-02-17 DIAGNOSIS — C50212 Malignant neoplasm of upper-inner quadrant of left female breast: Secondary | ICD-10-CM

## 2024-02-17 DIAGNOSIS — Z170 Estrogen receptor positive status [ER+]: Secondary | ICD-10-CM | POA: Diagnosis not present

## 2024-02-17 LAB — BASIC METABOLIC PANEL WITH GFR
BUN/Creatinine Ratio: 13 (ref 12–28)
BUN: 14 mg/dL (ref 8–27)
CO2: 19 mmol/L — ABNORMAL LOW (ref 20–29)
Calcium: 9.8 mg/dL (ref 8.7–10.3)
Chloride: 100 mmol/L (ref 96–106)
Creatinine, Ser: 1.05 mg/dL — ABNORMAL HIGH (ref 0.57–1.00)
Glucose: 159 mg/dL — ABNORMAL HIGH (ref 70–99)
Potassium: 4.8 mmol/L (ref 3.5–5.2)
Sodium: 134 mmol/L (ref 134–144)
eGFR: 55 mL/min/1.73 — ABNORMAL LOW (ref >59)

## 2024-02-17 NOTE — Progress Notes (Signed)
 HEMATOLOGY-ONCOLOGY TELEPHONE VISIT PROGRESS NOTE  I connected with our patient on 02/17/24 at 11:30 AM EDT by telephone and verified that I am speaking with the correct person using two identifiers.  I discussed the limitations, risks, security and privacy concerns of performing an evaluation and management service by telephone and the availability of in person appointments.  I also discussed with the patient that there may be a patient responsible charge related to this service. The patient expressed understanding and agreed to proceed.   History of Present Illness: Follow-up to discuss treatment plan after completion of anastrozole  therapy in January 2026  History of Present Illness Gloria Mckinney is a 78 year old female who presents for follow-up regarding her anastrozole  treatment and other health concerns.  She has been on anastrozole  since January 2021, with a planned completion in January 2026. Annual mammograms are conducted in March. She experiences intermittent knee pain, which sometimes requires medication. Her other knee feels tired, managed with medication. She reports minor eye problems without specific details.    Oncology History  Malignant neoplasm of upper-inner quadrant of left breast in female, estrogen receptor positive (HCC)  07/18/2018 Initial Diagnosis   Screening detected left breast mass at 10 o'clock position 1.8 cm axilla negative, biopsy revealed grade 3 IDC ER 80%, PR 70%, Ki-67 40%, HER-2 +3+ by IHC with heterogeneity, T1c N0 stage Ia clinical stage   08/15/2018 Surgery   Left Lumpectomy [facility]) [phone]): IDC grade 3, 1.9 cm, Posterior margin positive (Muscle) due to a transected satellite nodule 0.5 cm, 3 lymph nodes negative for carcinoma, ER 80%, PR 70%, Ki-67 40%, HER-2 +3+ by IHC with heterogeneity T1cN0 Stage 1A.    08/24/2018 Cancer Staging   Staging form: Breast, AJCC 8th Edition - Pathologic: Stage IA (pT1b, pN0, cM0, G3, ER+, PR+, HER2+)      09/09/2018 - 08/03/2019 Chemotherapy   trastuzumab  (HERCEPTIN ) 300 mg in sodium chloride  0.9 % 250 mL chemo infusion, 294 mg, Intravenous,  Once, 8 of 8 cycles. Administration: 300 mg (09/09/2018), 150 mg (09/19/2018), 150 mg (10/10/2018), 450 mg (12/19/2018), 450 mg (01/09/2019), 450 mg (03/13/2019), 150 mg (09/26/2018), 150 mg (10/03/2018), 150 mg (10/17/2018), 150 mg (10/24/2018), 150 mg (10/31/2018), 150 mg (11/07/2018), 150 mg (11/14/2018), 150 mg (11/21/2018), 450 mg (11/28/2018), 450 mg (01/30/2019), 450 mg (02/20/2019)  PACLitaxel  (TAXOL ) 150 mg in sodium chloride  0.9 % 250 mL chemo infusion (</= 80mg /m2), 80 mg/m2 = 150 mg, Intravenous,  Once, 3 of 3 cycles. Dose modification: 60 mg/m2 (original dose 80 mg/m2, Cycle 2, Reason: Dose not tolerated). Administration: 150 mg (09/09/2018), 150 mg (09/19/2018), 150 mg (10/10/2018), 150 mg (09/26/2018), 150 mg (10/03/2018), 114 mg (10/17/2018), 114 mg (10/24/2018), 114 mg (10/31/2018), 114 mg (11/07/2018), 114 mg (11/14/2018), 114 mg (11/21/2018), 114 mg (11/28/2018)  trastuzumab -dkst (OGIVRI ) 450 mg in sodium chloride  0.9 % 250 mL chemo infusion, 450 mg (100 % of original dose 450 mg), Intravenous,  Once, 7 of 7 cycles. Dose modification: 450 mg (original dose 450 mg, Cycle 9, Reason: Other (see comments), Comment: Biosimilar Conversion). Administration: 450 mg (04/03/2019), 450 mg (04/24/2019), 450 mg (05/15/2019), 450 mg (06/05/2019), 450 mg (06/26/2019), 450 mg (07/17/2019), 450 mg (08/03/2019)    01/16/2019 - 02/13/2019 Radiation Therapy   The patient initially received a dose of 40.05 Gy in 15 fractions to the breast using whole-breast tangent fields. This was delivered using a 3-D conformal technique. The pt received a boost delivering an additional 10 Gy in 5 fractions using a electron boost with  electrons. The total dose was 50.05 Gy.    08/2019 - 08/2024 Anti-estrogen oral therapy   Anastrozole      REVIEW OF SYSTEMS:   Constitutional: Denies fevers, chills or abnormal  weight loss All other systems were reviewed with the patient and are negative. Observations/Objective:     Assessment Plan:  Malignant neoplasm of upper-inner quadrant of left breast in female, estrogen receptor positive (HCC) 08/15/18: Left Lumpectomy: IDC grade 3, 1.9 cm, Posterior margin positive (Muscle) due to a transected satellite nodule 0.5 cm, 0/3 LN Neg, ER 80%, PR 70%, Ki-67 40%, HER-2 +3+ by IHC with heterogeneity T1cN0 Stage 1A   Posterior Margin: based on discussions with surgery, it is muscle and hence no further surgery is possible.   Plan: 1. Adjuvant Taxol  Herceptin  weekly x12 completed 11/28/2018 followed by Herceptin  maintenance for 1 year to be completed 08/03/2019 2.  Adjuvant radiation therapy completed 02/13/2019 3.  Followed by adjuvant antiestrogen therapy to start January 2021 --------------------------------------------------------------------------------------------------------------------------------------------------------- Current treatment: Adjuvant Herceptin  completed 08/03/2019, adjuvant anastrozole  started January 2021 Adjuvant radiation completed 02/13/2019 Echocardiogram 11/29/2018: EF 55 to 60%   Iron deficiency anemia:  IV iron given 04/24/2019 currently on oral iron which will be completed in December 2020 hemoglobin improved to 13.7   Anastrozole  toxicities: tolerating it well. No side effects Dryness of Mouth: Currently using Biotene   Breast cancer surveillance: Mammogram: 10/26/23: Benign breast density category C Breast exam 02/17/2024: Benign CT abdomen: 12/02/2022: Appendicitis status post appendectomy MRI lumbar spine 01/16/2023: Degeneration with spinal stenosis   She underwent knee replacement surgery in August 2023 but she is still having stiffness and achiness in the knee..   RTC on an as needed basis  Assessment & Plan Malignant neoplasm of upper-inner quadrant of left breast On anastrozole  for ER-positive breast cancer since January 2021.  Recent mammograms show no issues. - Continue anastrozole  until January 2026. - Schedule annual mammograms every March. - Maintain follow-up with primary care physician. - Contact oncology as needed.      I discussed the assessment and treatment plan with the patient. The patient was provided an opportunity to ask questions and all were answered. The patient agreed with the plan and demonstrated an understanding of the instructions. The patient was advised to call back or seek an in-person evaluation if the symptoms worsen or if the condition fails to improve as anticipated.   I provided 20 minutes of non-face-to-face time during this encounter.  This includes time for charting and coordination of care   Naomi MARLA Chad, MD

## 2024-02-18 NOTE — Progress Notes (Signed)
 Very stable renal function. No change in medication needeed

## 2024-03-31 ENCOUNTER — Ambulatory Visit: Admitting: Cardiology

## 2024-04-07 LAB — VITAMIN B12: Vitamin B-12: 290

## 2024-04-07 LAB — CBC AND DIFFERENTIAL
HCT: 31 — AB (ref 36–46)
Hemoglobin: 10.4 — AB (ref 12.0–16.0)
Neutrophils Absolute: 4.3
Platelets: 238 K/uL (ref 150–400)
WBC: 7.2

## 2024-04-07 LAB — BASIC METABOLIC PANEL WITH GFR
BUN: 14 (ref 4–21)
CO2: 25 — AB (ref 13–22)
Chloride: 103 (ref 99–108)
Creatinine: 0.9 (ref 0.5–1.1)
Glucose: 160
Potassium: 4.7 meq/L (ref 3.5–5.1)
Sodium: 134 — AB (ref 137–147)

## 2024-04-07 LAB — HEMOGLOBIN A1C: Hemoglobin A1C: 8.6

## 2024-04-07 LAB — COMPREHENSIVE METABOLIC PANEL WITH GFR
Albumin: 4 (ref 3.5–5.0)
Calcium: 9.9 (ref 8.7–10.7)
eGFR: 64

## 2024-04-07 LAB — TSH: TSH: 1.34 (ref 0.41–5.90)

## 2024-04-07 LAB — CBC: RBC: 3.75 — AB (ref 3.87–5.11)

## 2024-04-07 LAB — PROTEIN / CREATININE RATIO, URINE: Creatinine, Urine: 42

## 2024-04-07 LAB — HEPATIC FUNCTION PANEL
ALT: 11 U/L (ref 7–35)
AST: 11 — AB (ref 13–35)
Alkaline Phosphatase: 82 (ref 25–125)
Bilirubin, Total: 0.5

## 2024-04-07 LAB — MICROALBUMIN, URINE: Microalb, Ur: 21.53

## 2024-04-07 LAB — VITAMIN D 25 HYDROXY (VIT D DEFICIENCY, FRACTURES): Vit D, 25-Hydroxy: 36.9

## 2024-04-07 LAB — MICROALBUMIN / CREATININE URINE RATIO: Microalb Creat Ratio: 510.2

## 2024-04-08 ENCOUNTER — Other Ambulatory Visit (HOSPITAL_BASED_OUTPATIENT_CLINIC_OR_DEPARTMENT_OTHER): Payer: Self-pay | Admitting: Family Medicine

## 2024-04-08 DIAGNOSIS — E2839 Other primary ovarian failure: Secondary | ICD-10-CM

## 2024-05-03 ENCOUNTER — Ambulatory Visit: Attending: Cardiology | Admitting: Cardiology

## 2024-05-03 ENCOUNTER — Encounter: Payer: Self-pay | Admitting: Cardiology

## 2024-05-03 VITALS — BP 156/84 | HR 78 | Resp 16 | Ht 62.0 in | Wt 174.0 lb

## 2024-05-03 DIAGNOSIS — I1A0 Resistant hypertension: Secondary | ICD-10-CM | POA: Insufficient documentation

## 2024-05-03 DIAGNOSIS — E7800 Pure hypercholesterolemia, unspecified: Secondary | ICD-10-CM | POA: Insufficient documentation

## 2024-05-03 MED ORDER — AMLODIPINE BESYLATE 10 MG PO TABS: 10.0000 mg | ORAL_TABLET | Freq: Every day | ORAL | Status: DC

## 2024-05-03 MED ORDER — EZETIMIBE 10 MG PO TABS: 10.0000 mg | ORAL_TABLET | Freq: Every day | ORAL | 3 refills | Status: AC

## 2024-05-03 MED ORDER — SPIRONOLACTONE 25 MG PO TABS: 25.0000 mg | ORAL_TABLET | Freq: Every day | ORAL | 3 refills | Status: DC

## 2024-05-03 NOTE — Progress Notes (Signed)
 Cardiology Office Note:  .   Date:  05/03/2024  ID:  Gloria Mckinney, DOB 1946/01/15, MRN [id number] PCP: Chrystal Lamarr RAMAN, MD  [facility] Providers Cardiologist:  Gordy Bergamo, MD   History of Present Illness: .   Gloria Mckinney is a 78 y.o. sian [place] female patient with hypertension, hypercholesterolemia, diabetes mellitus, peripheral arterial disease, breast cancer SP left breast lumpectomy followed by chemo and XRT therapy in 2020 presents for management of resistant hypertension.  She presents for a 64-month office visit, no significant change in leg edema in spite of discontinuing amlodipine  and would like to try to go back on the medication.  Otherwise remains asymptomatic.  Cardiac Studies relevent.    ECHOCARDIOGRAM COMPLETE 01/14/2024  1. Left ventricular ejection fraction, by estimation, is 60 to 65%. Left ventricular ejection fraction by 3D volume is 68 %. The left ventricle has normal function. The left ventricle has no regional wall motion abnormalities. There is mild concentric left ventricular hypertrophy. Left ventricular diastolic parameters are consistent with Grade I diastolic dysfunction (impaired relaxation). 2. Right ventricular systolic function is normal. The right ventricular size is normal. 3. Left atrial size was mildly dilated.    Discussed the use of AI scribe software for clinical note transcription with the patient, who gave verbal consent to proceed.  History of Present Illness Gloria Mckinney is a 78 year old female with hypertension and diabetes who presents with uncontrolled blood pressure and diabetes management.  She experiences elevated blood pressure readings, reaching up to 169/179 mmHg, despite being on amlodipine , valsartan , and furosemide. Her diabetes is poorly controlled with a hemoglobin A1c of 8.6%. She is on metformin, insulin , and Ozempic 0.25 mg, which she started two years ago. Nausea with  Ozempic, especially when eating, limits her ability to increase the dose. Her high cholesterol is not well controlled, and she is taking simvastatin  40 mg in the evening.  Labs   Recent Labs    09/25/23 0702 09/26/23 0512 09/27/23 0554 01/26/24 1127 02/16/24 1449  NA 134* 134* 132* 136 134  K 3.7 4.0 3.9 5.3* 4.8  CL 100 100 97* 99 100  CO2 23 23 24 20  19*  GLUCOSE 135* 157* 139* 128* 159*  BUN 13 12 13 14 14   CREATININE 1.05* 0.94 1.03* 1.02* 1.05*  CALCIUM 9.5 9.5 9.5 10.1 9.8  GFRNONAA 55* >60 57*  --   --     Lab Results  Component Value Date   ALT 30 09/27/2023   AST 29 09/27/2023   ALKPHOS 48 09/27/2023   BILITOT 0.7 09/27/2023      Latest Ref Rng & Units 09/27/2023    5:54 AM 09/26/2023    5:12 AM 09/25/2023    7:02 AM  CBC  WBC 4.0 - 10.5 K/uL 12.6  7.6  9.2   Hemoglobin 12.0 - 15.0 g/dL 9.9  89.6  89.2   Hematocrit 36.0 - 46.0 % 29.0  29.9  31.9   Platelets 150 - 400 K/uL 241  247  264    Lab Results  Component Value Date   HGBA1C 7.3 (H) 09/25/2023     Care everywhere/Faxed External Labs:  WBC 7.2 4.0-11.0 K/ul    RBC 3.75 4.20-5.40 M/uL    HGB 10.4 12.0-16.0 g/dL    HCT 69.4 62.9-52.9 %    MCV 81.3 81.0-99.0 fL    MCH 27.7 27.0-33.0 pg    MCHC 34.1 32.0-36.0 g/dL    RDW 84.6 88.4-84.4 %  PLT 238 150-400    Hgb A1c 8.6   Glucose 160 70-99 mg/dL    BUN 14 3-73 mg/dL    Creatinine 9.07 9.39-8.69 mg/dl    [id number] 64 >39 calc   Sodium 134 136-145 mmol/L    Potassium 4.7 3.5-5.5 mmol/L    PCP labs: KPN   Labs 03/11/2023:   Total cholesterol 169, triglycerides 114, HDL 59, LDL 90.   TSH normal at 1.790.  ROS  Review of Systems  Cardiovascular:  Positive for leg swelling. Negative for chest pain and dyspnea on exertion.   Physical Exam:   VS:  BP (!) 156/84 (BP Location: Left Arm, Patient Position: Sitting, Cuff Size: Normal)   Pulse 78   Resp 16   Ht 5' 2 (1.575 m)   Wt 174 lb (78.9 kg)   SpO2 98%   BMI 31.83 kg/m    Wt  Readings from Last 3 Encounters:  05/03/24 174 lb (78.9 kg)  01/25/24 177 lb 6.4 oz (80.5 kg)  12/21/23 178 lb (80.7 kg)    BP Readings from Last 3 Encounters:  05/03/24 (!) 156/84  01/25/24 134/72  12/21/23 (!) 195/78   Physical Exam Constitutional:      Appearance: She is obese.  Neck:     Vascular: No carotid bruit or JVD.  Cardiovascular:     Rate and Rhythm: Normal rate and regular rhythm.     Pulses: Intact distal pulses.     Heart sounds: Normal heart sounds. No murmur heard.    No gallop.  Pulmonary:     Effort: Pulmonary effort is normal.     Breath sounds: Normal breath sounds.  Abdominal:     General: Bowel sounds are normal.     Palpations: Abdomen is soft.  Musculoskeletal:     Right lower leg: Edema (1-2+ trace edema) present.     Left lower leg: Edema (1-2+ trace edema) present.    EKG:         ASSESSMENT AND PLAN: .      ICD-10-CM   1. Resistant hypertension  I1A.0 spironolactone  (ALDACTONE ) 25 MG tablet    amLODipine  (NORVASC ) 10 MG tablet    2. Hypercholesterolemia  E78.00 ezetimibe  (ZETIA ) 10 MG tablet     Assessment & Plan  Uncontrolled hypertension Blood pressure remains elevated with readings of 159-179 mmHg. Current medications include amlodipine  and valsartan . Swelling is suspected to be related to diet rather than amlodipine . Plan to optimize blood pressure control and address peripheral edema. Discussed the potential benefits of dietary changes and medication adjustments to improve blood pressure and reduce swelling. - Increase amlodipine  to 10 mg if tolerated, monitor for worsening edema. - Initiate spironolactone  25 mg to aid in fluid management. - Communicate with primary care provider regarding medication adjustments.  Uncontrolled type 2 diabetes mellitus with hyperglycemia Diabetes is not well controlled with a recent HbA1c of 8.6%. She has been on metformin, insulin , and Ozempic 0.25 mg for two years. Experiences nausea with  Ozempic limits dose escalation. The goal is to improve diabetes control and reduce medication burden by increasing Ozempic dose to 2.5 mg, despite nausea, to aid in weight loss and appetite suppression. Discussed that nausea is common and may improve with dietary adjustments. Emphasized the importance of weight loss for overall health improvement. - Increase Ozempic dose to0.5 mg and communicate this to her primary care provider. - Educate on managing nausea by eating slowly and in small amounts. - Encourage fluid intake to prevent dehydration.  Peripheral edema Persistent peripheral edema, likely related to dietary factors and possibly amlodipine . Emphasized the role of dietary salt intake in edema and the potential benefits of spironolactone  for fluid management. - Educate on reducing dietary salt intake. - Initiate spironolactone  25 mg to manage fluid retention.  Hyperlipidemia, not well controlled Cholesterol levels are not well controlled. Currently on simvastatin  40 mg. Plan to enhance lipid control with additional medication. Discussed the benefits of combination therapy for cholesterol management. - Add Zetia  10 mg to simvastatin  40 mg regimen. - Educate on the benefits of combination therapy for cholesterol management.  Anemia, unspecified Mild anemia noted with low blood counts. Under surveillance by oncologist for breast cancer.   Follow up: 3 months for Hypertension and hyperlipidemia  Signed,  Gordy Bergamo, MD, [facility] 05/03/2024, 4:20 PM [facility] [address] [place], [place] [address] Phone: [phone]. Fax:  [phone]

## 2024-05-03 NOTE — Patient Instructions (Addendum)
 Medication Instructions:  Stop Amlodipine  2.5 mg   Start Amlodipine  10 mg  daily   Start Spironolactone  25 mg daily   Zetia  10  mg  daily  *If you need a refill on your cardiac medications before your next appointment, please call your pharmacy*   Lab Work: BMP in 2 weeks  after starting Spironolactone   If you have labs (blood work) drawn today and your tests are completely normal, you will receive your results only by: MyChart Message (if you have MyChart) OR A paper copy in the mail If you have any lab test that is abnormal or we need to change your treatment, we will call you to review the results.   Testing/Procedures: Not needed   Follow-Up: At [facility], you and your health needs are our priority.  As part of our continuing mission to provide you with exceptional heart care, we have created designated Provider Care Teams.  These Care Teams include your primary Cardiologist (physician) and Advanced Practice Providers (APPs -  Physician Assistants and Nurse Practitioners) who all work together to provide you with the care you need, when you need it.     Your next appointment:   3 month(s)  The format for your next appointment:   In Person  Provider:   Gordy Bergamo, MD

## 2024-05-10 ENCOUNTER — Other Ambulatory Visit: Payer: Self-pay | Admitting: Family Medicine

## 2024-05-10 DIAGNOSIS — Z1231 Encounter for screening mammogram for malignant neoplasm of breast: Secondary | ICD-10-CM

## 2024-05-16 ENCOUNTER — Telehealth: Payer: Self-pay | Admitting: Cardiology

## 2024-05-16 NOTE — Telephone Encounter (Signed)
 Pt c/o medication issue:  1. Name of Medication:  spironolactone  (ALDACTONE ) 25 MG tablet ezetimibe  (ZETIA ) 10 MG tablet  2. How are you currently taking this medication (dosage and times per day)?   3. Are you having a reaction (difficulty breathing--STAT)?   4. What is your medication issue?    Granddaughter says patient has been experiencing burning urination the past 4-5 days. She feels it in the morning when she uses the restroom and it normally gets better by around 4:00 PM. No other symptoms. Granddaughter thinks one of these medications may be causing this. She says patient doesn't have a fever, so she doesn't think it's a UTI. Please advise.

## 2024-05-16 NOTE — Telephone Encounter (Signed)
 Spoke to Granddaughter ,recommendation  to contact primary .  also message will be sent to Dr Ladona to review and respond.   The medication in question is unlikely the problem She verbalized understanding.

## 2024-05-25 ENCOUNTER — Ambulatory Visit: Payer: Self-pay | Admitting: Cardiology

## 2024-05-25 LAB — BASIC METABOLIC PANEL WITH GFR
BUN/Creatinine Ratio: 18 (ref 12–28)
BUN: 24 mg/dL (ref 8–27)
CO2: 15 mmol/L — ABNORMAL LOW (ref 20–29)
Calcium: 10 mg/dL (ref 8.7–10.3)
Chloride: 100 mmol/L (ref 96–106)
Creatinine, Ser: 1.37 mg/dL — ABNORMAL HIGH (ref 0.57–1.00)
Glucose: 163 mg/dL — ABNORMAL HIGH (ref 70–99)
Potassium: 5.5 mmol/L — ABNORMAL HIGH (ref 3.5–5.2)
Sodium: 130 mmol/L — ABNORMAL LOW (ref 134–144)
eGFR: 40 mL/min/1.73 — ABNORMAL LOW (ref >59)

## 2024-05-25 NOTE — Progress Notes (Signed)
 Her renal function has slightly decreased since starting spironolactone  and also sodium levels have dropped.  Potassium level has increased.  Hence would recommend discontinuing spironolactone .  If her blood pressure continues to remain high, please inform me, we could try clonidine at the low-dose of 0.1 mg twice daily.

## 2024-05-29 ENCOUNTER — Encounter: Admit: 2024-05-29 | Payer: PRIVATE HEALTH INSURANCE | Attending: Internal Medicine | Primary: Internal Medicine

## 2024-06-05 ENCOUNTER — Telehealth: Payer: Self-pay

## 2024-06-05 ENCOUNTER — Inpatient Hospital Stay

## 2024-06-05 ENCOUNTER — Other Ambulatory Visit: Payer: Self-pay

## 2024-06-05 DIAGNOSIS — R300 Dysuria: Secondary | ICD-10-CM

## 2024-06-05 DIAGNOSIS — N182 Chronic kidney disease, stage 2 (mild): Secondary | ICD-10-CM

## 2024-06-05 DIAGNOSIS — Z170 Estrogen receptor positive status [ER+]: Secondary | ICD-10-CM

## 2024-06-05 NOTE — Telephone Encounter (Signed)
 S/w patient's daughter regarding persistent urinary symptoms. Daughter reports that patient has been experiencing urgency and burning sensation with urination for the past 3 months. Patient has been evaluated by PCP for possible UTI and received Abx with no relief.  Dr. Odean made aware of the situation and would like patient to be evaluated in the clinic by Morna Kendall, NP for these symptoms.  Scheduled patient for lab and visit with [facility], 10/28. Appointment date and time confirmed with daughter.  All questions answered at time of call.

## 2024-06-06 ENCOUNTER — Encounter: Payer: Self-pay | Admitting: Adult Health

## 2024-06-06 ENCOUNTER — Ambulatory Visit: Payer: Self-pay

## 2024-06-06 ENCOUNTER — Inpatient Hospital Stay

## 2024-06-06 ENCOUNTER — Inpatient Hospital Stay: Attending: Adult Health | Admitting: Adult Health

## 2024-06-06 VITALS — BP 143/65 | HR 71 | Temp 97.2°F | Resp 18 | Ht 62.0 in | Wt 172.0 lb

## 2024-06-06 DIAGNOSIS — E1151 Type 2 diabetes mellitus with diabetic peripheral angiopathy without gangrene: Secondary | ICD-10-CM | POA: Insufficient documentation

## 2024-06-06 DIAGNOSIS — Z1731 Human epidermal growth factor receptor 2 positive status: Secondary | ICD-10-CM | POA: Diagnosis not present

## 2024-06-06 DIAGNOSIS — D72829 Elevated white blood cell count, unspecified: Secondary | ICD-10-CM | POA: Insufficient documentation

## 2024-06-06 DIAGNOSIS — M1990 Unspecified osteoarthritis, unspecified site: Secondary | ICD-10-CM | POA: Insufficient documentation

## 2024-06-06 DIAGNOSIS — E871 Hypo-osmolality and hyponatremia: Secondary | ICD-10-CM | POA: Diagnosis not present

## 2024-06-06 DIAGNOSIS — C50212 Malignant neoplasm of upper-inner quadrant of left female breast: Secondary | ICD-10-CM | POA: Diagnosis present

## 2024-06-06 DIAGNOSIS — K219 Gastro-esophageal reflux disease without esophagitis: Secondary | ICD-10-CM | POA: Diagnosis not present

## 2024-06-06 DIAGNOSIS — Z923 Personal history of irradiation: Secondary | ICD-10-CM | POA: Insufficient documentation

## 2024-06-06 DIAGNOSIS — I739 Peripheral vascular disease, unspecified: Secondary | ICD-10-CM | POA: Diagnosis not present

## 2024-06-06 DIAGNOSIS — E785 Hyperlipidemia, unspecified: Secondary | ICD-10-CM | POA: Diagnosis not present

## 2024-06-06 DIAGNOSIS — E119 Type 2 diabetes mellitus without complications: Secondary | ICD-10-CM | POA: Insufficient documentation

## 2024-06-06 DIAGNOSIS — D509 Iron deficiency anemia, unspecified: Secondary | ICD-10-CM | POA: Insufficient documentation

## 2024-06-06 DIAGNOSIS — R300 Dysuria: Secondary | ICD-10-CM

## 2024-06-06 DIAGNOSIS — N1832 Chronic kidney disease, stage 3b: Secondary | ICD-10-CM | POA: Insufficient documentation

## 2024-06-06 DIAGNOSIS — Z79811 Long term (current) use of aromatase inhibitors: Secondary | ICD-10-CM | POA: Diagnosis not present

## 2024-06-06 DIAGNOSIS — Z170 Estrogen receptor positive status [ER+]: Secondary | ICD-10-CM | POA: Diagnosis not present

## 2024-06-06 DIAGNOSIS — E1122 Type 2 diabetes mellitus with diabetic chronic kidney disease: Secondary | ICD-10-CM | POA: Diagnosis not present

## 2024-06-06 DIAGNOSIS — N390 Urinary tract infection, site not specified: Secondary | ICD-10-CM | POA: Diagnosis not present

## 2024-06-06 DIAGNOSIS — E782 Mixed hyperlipidemia: Secondary | ICD-10-CM | POA: Diagnosis not present

## 2024-06-06 DIAGNOSIS — Z1721 Progesterone receptor positive status: Secondary | ICD-10-CM | POA: Insufficient documentation

## 2024-06-06 DIAGNOSIS — Z794 Long term (current) use of insulin: Secondary | ICD-10-CM | POA: Diagnosis not present

## 2024-06-06 DIAGNOSIS — Z9221 Personal history of antineoplastic chemotherapy: Secondary | ICD-10-CM | POA: Insufficient documentation

## 2024-06-06 LAB — URINALYSIS, COMPLETE (UACMP) WITH MICROSCOPIC
Bilirubin Urine: NEGATIVE
Glucose, UA: NEGATIVE mg/dL
Hgb urine dipstick: NEGATIVE
Ketones, ur: NEGATIVE mg/dL
Nitrite: NEGATIVE
Protein, ur: NEGATIVE mg/dL
Specific Gravity, Urine: 1.009 (ref 1.005–1.030)
WBC, UA: >50 WBC/hpf (ref 0–5)
pH: 5 (ref 5.0–8.0)

## 2024-06-06 MED ORDER — SULFAMETHOXAZOLE-TRIMETHOPRIM 800-160 MG PO TABS: 1.0000 | ORAL_TABLET | Freq: Two times a day (BID) | ORAL | 0 refills | Status: DC

## 2024-06-06 NOTE — Telephone Encounter (Signed)
 Update on Gloria Mckinney's Health:  The urine test collected here at the [facility] during Gloria visit today with Morna Kendall DNP shows Gloria Mckinney with an acute urinary tract infection (UTI). Antibiotics have been prescribed and sent to the pharmacy for her to begin treatment.  We're currently waiting on the urine culture results, which should be back in about 2 days. If the results indicate a need to change the antibiotic, we'll contact you right away.  Please let us  know if you have any questions or if she experiences any new symptoms.

## 2024-06-06 NOTE — Patient Instructions (Signed)
 For vaginal dryness:  Consider Vitamin E suppositories Key E suppositories from [employer] inserted vaginally three nights a week Consider use of hyaluronic acid suppositories over the counter Consider use of pH-D as directed Be cautious not to use too much because that can lead to a yeast infection.    If these are ineffective, please let me know and we can try a vaginal estrogen cream.   I placed a referral to [facility] to see Dr. Elisabeth.

## 2024-06-06 NOTE — Telephone Encounter (Signed)
-----   Message from Morna JAYSON Kendall sent at 06/06/2024  2:51 PM EDT ----- Please let patient daughter who speaks english know that she has acute UTI and I sent in antibiotics for her to take.  We area waiting on urine culture to result and will let her know if antibiotics  need to change once we have that information.  It should be back in 2 days.  ----- Message ----- From: Rebecka, Lab [facility] [place] Sent: 06/06/2024  10:38 AM EDT To: Morna Dalton Kendall, NP

## 2024-06-06 NOTE — Progress Notes (Unsigned)
 [facility] Cancer Follow up:    Gloria Lamarr RAMAN, MD [address] [place] [address]   DIAGNOSIS: Cancer Staging  Malignant neoplasm of upper-inner quadrant of left breast in female, estrogen receptor positive (HCC) Staging form: Breast, AJCC 8th Edition - Pathologic: Stage IA (pT1b, pN0, cM0, G3, ER+, PR+, HER2+) - Signed by Crawford Morna Pickle, NP on 08/24/2018 Histologic grading system: 3 grade system    SUMMARY OF ONCOLOGIC HISTORY: Oncology History  Malignant neoplasm of upper-inner quadrant of left breast in female, estrogen receptor positive (HCC)  07/18/2018 Initial Diagnosis   Screening detected left breast mass at 10 o'clock position 1.8 cm axilla negative, biopsy revealed grade 3 IDC ER 80%, PR 70%, Ki-67 40%, HER-2 +3+ by IHC with heterogeneity, T1c N0 stage Ia clinical stage   08/15/2018 Surgery   Left Lumpectomy Warden) [phone]): IDC grade 3, 1.9 cm, Posterior margin positive (Muscle) due to a transected satellite nodule 0.5 cm, 3 lymph nodes negative for carcinoma, ER 80%, PR 70%, Ki-67 40%, HER-2 +3+ by IHC with heterogeneity T1cN0 Stage 1A.    08/24/2018 Cancer Staging   Staging form: Breast, AJCC 8th Edition - Pathologic: Stage IA (pT1b, pN0, cM0, G3, ER+, PR+, HER2+)     09/09/2018 - 08/03/2019 Chemotherapy   trastuzumab  (HERCEPTIN ) 300 mg in sodium chloride  0.9 % 250 mL chemo infusion, 294 mg, Intravenous,  Once, 8 of 8 cycles. Administration: 300 mg (09/09/2018), 150 mg (09/19/2018), 150 mg (10/10/2018), 450 mg (12/19/2018), 450 mg (01/09/2019), 450 mg (03/13/2019), 150 mg (09/26/2018), 150 mg (10/03/2018), 150 mg (10/17/2018), 150 mg (10/24/2018), 150 mg (10/31/2018), 150 mg (11/07/2018), 150 mg (11/14/2018), 150 mg (11/21/2018), 450 mg (11/28/2018), 450 mg (01/30/2019), 450 mg (02/20/2019)  PACLitaxel  (TAXOL ) 150 mg in sodium chloride  0.9 % 250 mL chemo infusion (</= 80mg /m2), 80 mg/m2 = 150 mg, Intravenous,  Once, 3 of 3 cycles. Dose  modification: 60 mg/m2 (original dose 80 mg/m2, Cycle 2, Reason: Dose not tolerated). Administration: 150 mg (09/09/2018), 150 mg (09/19/2018), 150 mg (10/10/2018), 150 mg (09/26/2018), 150 mg (10/03/2018), 114 mg (10/17/2018), 114 mg (10/24/2018), 114 mg (10/31/2018), 114 mg (11/07/2018), 114 mg (11/14/2018), 114 mg (11/21/2018), 114 mg (11/28/2018)  trastuzumab -dkst (OGIVRI ) 450 mg in sodium chloride  0.9 % 250 mL chemo infusion, 450 mg (100 % of original dose 450 mg), Intravenous,  Once, 7 of 7 cycles. Dose modification: 450 mg (original dose 450 mg, Cycle 9, Reason: Other (see comments), Comment: Biosimilar Conversion). Administration: 450 mg (04/03/2019), 450 mg (04/24/2019), 450 mg (05/15/2019), 450 mg (06/05/2019), 450 mg (06/26/2019), 450 mg (07/17/2019), 450 mg (08/03/2019)    01/16/2019 - 02/13/2019 Radiation Therapy   The patient initially received a dose of 40.05 Gy in 15 fractions to the breast using whole-breast tangent fields. This was delivered using a 3-D conformal technique. The pt received a boost delivering an additional 10 Gy in 5 fractions using a electron boost with electrons. The total dose was 50.05 Gy.    08/2019 - 08/2024 Anti-estrogen oral therapy   Anastrozole      CURRENT THERAPY:  INTERVAL HISTORY:  Discussed the use of AI scribe software for clinical note transcription with the patient, who gave verbal consent to proceed.  Gloria Mckinney 78 y.o. female returns for    Patient Active Problem List   Diagnosis Date Noted  . Acute cholecystitis without calculus 09/25/2023  . Abdominal pain 09/24/2023  . Hyponatremia 09/24/2023  . Leukocytosis 09/24/2023  . Dry mouth and eyes 09/24/2023  .  abnormal imaging  of right breast 09/24/2023  . Acute appendicitis 12/02/2022  . Gastro-esophageal reflux disease without esophagitis 07/30/2021  . Peripheral vascular disease 07/30/2021  . Stage 2 chronic kidney disease 07/30/2021  . Port-A-Cath in place 09/19/2018  .  Malignant neoplasm of upper-inner quadrant of left breast in female, estrogen receptor positive (HCC) 07/27/2018  . Hypertensive urgency 11/08/2017  . Mixed hyperlipidemia 11/08/2017  . Dyspnea on exertion 10/21/2016  . Type 2 diabetes mellitus without complication, without long-term current use of insulin  (HCC) 10/21/2016    is allergic to penicillins, nitrofurantoin monohyd macro, sitagliptin, cephalexin, latex, and statins.  MEDICAL HISTORY: Past Medical History:  Diagnosis Date  . Cancer (HCC)   . GERD (gastroesophageal reflux disease)   . History of radiation therapy 01/16/19- 02/13/19   left breast 15 fractions of 2.67 Gy for a total of 40.05 Gy. Left breast boost 5 fractions of 2 Gy for a total of 10 Gy  . Hypertension   . Mixed hyperlipidemia   . OA (osteoarthritis)    knee right  . Peripheral neuropathy   . PVD (peripheral vascular disease)   . Type 2 diabetes mellitus treated with insulin  [facility])    endocrinologist--- dr elizbeth Dukes    SURGICAL HISTORY: Past Surgical History:  Procedure Laterality Date  . BREAST BIOPSY Bilateral 2000   benign  . BREAST LUMPECTOMY WITH RADIOACTIVE SEED AND SENTINEL LYMPH NODE BIOPSY Left 08/15/2018   Procedure: LEFT BREAST LUMPECTOMY WITH RADIOACTIVE SEED AND LEFT AXILLARY DEEP SENTINEL LYMPH NODE BIOPSY INJECT BLUE DYE LEFT BREAST;  Surgeon: Gail Favorite, MD;  Location: [facility];  Service: General;  Laterality: Left;  . CHOLECYSTECTOMY N/A 09/26/2023   Procedure: LAPAROSCOPIC CHOLECYSTECTOMY;  Surgeon: Ann Fine, MD;  Location: [facility] OR;  Service: General;  Laterality: N/A;  . LAPAROSCOPIC APPENDECTOMY N/A 12/02/2022   Procedure: APPENDECTOMY LAPAROSCOPIC;  Surgeon: Lyndel Deward PARAS, MD;  Location: [facility] OR;  Service: General;  Laterality: N/A;  . PORT-A-CATH REMOVAL Right 08/09/2019   Procedure: REMOVAL PORT-A-CATH;  Surgeon: Gail Favorite, MD;  Location: [facility];  Service: General;  Laterality:  Right;  . PORTACATH PLACEMENT Right 08/15/2018   Procedure: INSERTION PORT-A-CATH WITH ULTRASOUND;  Surgeon: Gail Favorite, MD;  Location: [facility];  Service: General;  Laterality: Right;  . SHOULDER ARTHROSCOPY Right 06-16-2002   dr graves  . SHOULDER OPEN ROTATOR CUFF REPAIR Right 07-28-2000    dr yvone  . VAGINAL HYSTERECTOMY  1992   with BSO  . WRIST SURGERY  2007    SOCIAL HISTORY: Social History   Socioeconomic History  . Marital status: Married    Spouse name: Not on file  . Number of children: 4  . Years of education: Not on file  . Highest education level: Not on file  Occupational History  . Not on file  Tobacco Use  . Smoking status: Never  . Smokeless tobacco: Never  Vaping Use  . Vaping status: Never Used  Substance and Sexual Activity  . Alcohol use: Never  . Drug use: Never  . Sexual activity: Not on file  Other Topics Concern  . Not on file  Social History Narrative   Lives with husband and son.     Social Drivers of Corporate Investment Banker Strain: Not on file  Food Insecurity: Patient Declined (09/25/2023)   Hunger Vital Sign   . Worried About Programme Researcher, Broadcasting/film/video in the Last Year: Patient declined   . Ran Out  of Food in the Last Year: Patient declined  Transportation Needs: Patient Declined (09/25/2023)   PRAPARE - Transportation   . Lack of Transportation (Medical): Patient declined   . Lack of Transportation (Non-Medical): Patient declined  Physical Activity: Not on file  Stress: Not on file  Social Connections: Unknown (09/25/2023)   Social Connection and Isolation Panel   . Frequency of Communication with Friends and Family: Once a week   . Frequency of Social Gatherings with Friends and Family: Once a week   . Attends Religious Services: More than 4 times per year   . Active Member of Clubs or Organizations: Patient declined   . Attends Banker Meetings: Patient declined   . Marital Status: Married  Careers Information Officer Violence: Patient Declined (09/25/2023)   Humiliation, Afraid, Rape, and Kick questionnaire   . Fear of Current or Ex-Partner: Patient declined   . Emotionally Abused: Patient declined   . Physically Abused: Patient declined   . Sexually Abused: Patient declined    FAMILY HISTORY: Family History  Problem Relation Age of Onset  . Hypertension Mother   . CVA Mother   . Heart disease Mother   . Asthma Father   . Hypertension Sister   . Diabetes Sister   . Heart disease Brother   . Hypertension Sister   . Diabetes Sister   . Hypertension Sister   . Diabetes Sister   . Healthy Son   . Healthy Daughter   . Healthy Daughter   . Healthy Daughter   . Breast cancer Neg Hx     Review of Systems - Oncology    PHYSICAL EXAMINATION    Vitals:   06/06/24 0850  BP: (!) 143/65  Pulse: 71  Resp: 18  Temp: (!) 97.2 F (36.2 C)  SpO2: 100%    Physical Exam  LABORATORY DATA:  CBC    Component Value Date/Time   WBC 12.6 (H) 09/27/2023 0554   RBC 3.55 (L) 09/27/2023 0554   HGB 9.9 (L) 09/27/2023 0554   HGB 12.8 08/03/2019 0942   HCT 29.0 (L) 09/27/2023 0554   PLT 241 09/27/2023 0554   PLT 222 08/03/2019 0942   MCV 81.7 09/27/2023 0554   MCH 27.9 09/27/2023 0554   MCHC 34.1 09/27/2023 0554   RDW 12.6 09/27/2023 0554   LYMPHSABS 0.6 (L) 12/02/2022 1154   MONOABS 1.2 (H) 12/02/2022 1154   EOSABS 0.0 12/02/2022 1154   BASOSABS 0.0 12/02/2022 1154    CMP     Component Value Date/Time   NA 130 (L) 05/24/2024 1447   K 5.5 (H) 05/24/2024 1447   CL 100 05/24/2024 1447   CO2 15 (L) 05/24/2024 1447   GLUCOSE 163 (H) 05/24/2024 1447   GLUCOSE 139 (H) 09/27/2023 0554   BUN 24 05/24/2024 1447   CREATININE 1.37 (H) 05/24/2024 1447   CREATININE 0.92 08/03/2019 0942   CALCIUM 10.0 05/24/2024 1447   PROT 5.8 (L) 09/27/2023 0554   ALBUMIN  2.7 (L) 09/27/2023 0554   AST 29 09/27/2023 0554   AST 16 08/03/2019 0942   ALT 30 09/27/2023 0554   ALT 22 08/03/2019 0942    ALKPHOS 48 09/27/2023 0554   BILITOT 0.7 09/27/2023 0554   BILITOT 0.3 08/03/2019 0942   GFRNONAA 57 (L) 09/27/2023 0554   GFRNONAA >60 08/03/2019 0942   GFRAA >60 08/03/2019 0942     ASSESSMENT and THERAPY PLAN:   No problem-specific Assessment & Plan notes found for this encounter.  All questions were answered. The patient knows to call the clinic with any problems, questions or concerns. We can certainly see the patient much sooner if necessary.  Total encounter time:*** minutes*in face-to-face visit time, chart review, lab review, care coordination, order entry, and documentation of the encounter time.    Morna Kendall, NP 06/06/24 8:58 AM Medical Oncology and Hematology [facility] [address] [place], [place] [address] Tel. [phone]    Fax. [phone]  *Total Encounter Time as defined by the Centers for Medicare and Medicaid Services includes, in addition to the face-to-face time of a patient visit (documented in the note above) non-face-to-face time: obtaining and reviewing outside history, ordering and reviewing medications, tests or procedures, care coordination (communications with other health care professionals or caregivers) and documentation in the medical record.

## 2024-06-08 ENCOUNTER — Encounter: Admit: 2024-06-08 | Payer: PRIVATE HEALTH INSURANCE | Attending: Internal Medicine | Primary: Internal Medicine

## 2024-06-08 LAB — URINE CULTURE: Culture: 30000 — AB

## 2024-06-08 NOTE — Assessment & Plan Note (Signed)
 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   Plan: 1. Adjuvant Taxol  Herceptin  weekly x12 completed 11/28/2018 followed by Herceptin  maintenance for 1 year to be completed 08/03/2019 2.  Adjuvant radiation therapy completed 02/13/2019 3.  Followed by adjuvant antiestrogen therapy to start January 2021 --------------------------------------------------------------------------------------------------------------------------------------------------------- Current treatment: Adjuvant Herceptin  completed 08/03/2019, adjuvant anastrozole  started January 2021 Adjuvant radiation completed 02/13/2019 Echocardiogram 11/29/2018: EF 55 to 60%   Iron deficiency anemia:  IV iron given 04/24/2019 currently on oral iron which will be completed in December 2020 hemoglobin improved to 13.7   Dysuria, nocturia, and recent UTI.    Will obtain urinalysis and urine culture today, will prescribe antibiotics if + for infection and follow culture.  Discussed role of dryness.  Recommended she wait for urinalysis results and take antibiotics if indicated.  Reviewed OTC remedies for vaginal dryness, and consideration of vaginal estrogen at low dose three times a week.  Since UTI was recent, and coupled with vaginal dryness also sent referral to urology.

## 2024-06-22 ENCOUNTER — Encounter: Admit: 2024-06-22 | Payer: PRIVATE HEALTH INSURANCE | Attending: Internal Medicine | Primary: Internal Medicine

## 2024-06-28 ENCOUNTER — Other Ambulatory Visit: Payer: Self-pay | Admitting: *Deleted

## 2024-06-28 ENCOUNTER — Telehealth: Payer: Self-pay | Admitting: Cardiology

## 2024-06-28 DIAGNOSIS — R600 Localized edema: Secondary | ICD-10-CM

## 2024-06-28 NOTE — Telephone Encounter (Signed)
 I spoke with patient's grand daughter.  Patient stopped spironolactone  as of today.   Swelling is the same in both legs. I gave grand daughter message from Dr Ladona.  Patient will have lab work done on Monday

## 2024-06-28 NOTE — Telephone Encounter (Signed)
 Hope she has stopped her aldactone . 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 She probably has venous insufficiency in both legs and helps wearing medical grade compression stocking as this cannot be treated surgically.   She needs BMP anytime to be done.

## 2024-06-28 NOTE — Telephone Encounter (Signed)
 I spoke with patient's grand daughter.  She reports patient continues to have swelling in her legs.  Has not improved since visit with Dr Ladona on 9/24 and is sometimes worse than it was on 9/24.  Swelling is OK in the AM and worsens after patient has been up for about 3 hours.  Patient has decreased her salt intake. BP last night was 169/70. I advised grand daughter to have patient elevate legs when possible Patient is currently taking spironolactone .  I reviewed recommendations from 10/16 lab results with grand daughter  Her renal function has slightly decreased since starting spironolactone  and also sodium levels have dropped.  Potassium level has increased.  Hence would recommend discontinuing spironolactone .   If her blood pressure continues to remain high, please inform me, we could try clonidine at the low-dose of 0.1 mg twice daily.   Patient will stop spironolactone  now and will check BP twice daily and call if elevated. Will forward to Dr Ladona for review/recommendations.

## 2024-06-28 NOTE — Telephone Encounter (Signed)
 Pt c/o swelling/edema: STAT if pt has developed SOB within 24 hours  If swelling, where is the swelling located? Both legs  How much weight have you gained and in what time span? no  Have you gained 2 pounds in a day or 5 pounds in a week? no  Do you have a log of your daily weights (if so, list)? Doesn't keep track  Are you currently taking a fluid pill? Yes   Are you currently SOB? no  Have you traveled recently in a car or plane for an extended period of time? no

## 2024-07-04 ENCOUNTER — Other Ambulatory Visit: Payer: Self-pay

## 2024-07-04 ENCOUNTER — Encounter (HOSPITAL_BASED_OUTPATIENT_CLINIC_OR_DEPARTMENT_OTHER): Payer: Self-pay | Admitting: Emergency Medicine

## 2024-07-04 ENCOUNTER — Ambulatory Visit: Payer: Self-pay | Admitting: Cardiology

## 2024-07-04 ENCOUNTER — Emergency Department (HOSPITAL_BASED_OUTPATIENT_CLINIC_OR_DEPARTMENT_OTHER)
Admission: EM | Admit: 2024-07-04 | Discharge: 2024-07-04 | Disposition: A | Discharge status: Home / Self Care | Attending: Emergency Medicine | Admitting: Emergency Medicine

## 2024-07-04 DIAGNOSIS — E875 Hyperkalemia: Secondary | ICD-10-CM | POA: Insufficient documentation

## 2024-07-04 DIAGNOSIS — Z79899 Other long term (current) drug therapy: Secondary | ICD-10-CM | POA: Diagnosis not present

## 2024-07-04 DIAGNOSIS — I10 Essential (primary) hypertension: Secondary | ICD-10-CM | POA: Insufficient documentation

## 2024-07-04 DIAGNOSIS — Z7982 Long term (current) use of aspirin: Secondary | ICD-10-CM | POA: Diagnosis not present

## 2024-07-04 DIAGNOSIS — Z8679 Personal history of other diseases of the circulatory system: Secondary | ICD-10-CM

## 2024-07-04 DIAGNOSIS — I1A0 Resistant hypertension: Secondary | ICD-10-CM

## 2024-07-04 DIAGNOSIS — R600 Localized edema: Secondary | ICD-10-CM

## 2024-07-04 LAB — CBG MONITORING, ED: Glucose-Capillary: 163 mg/dL — ABNORMAL HIGH (ref 70–99)

## 2024-07-04 LAB — COMPREHENSIVE METABOLIC PANEL WITH GFR
ALT: 13 U/L (ref 0–44)
AST: 15 U/L (ref 15–41)
Albumin: 4.1 g/dL (ref 3.5–5.0)
Alkaline Phosphatase: 76 U/L (ref 38–126)
Anion gap: 12 (ref 5–15)
BUN: 23 mg/dL (ref 8–23)
CO2: 18 mmol/L — ABNORMAL LOW (ref 22–32)
Calcium: 9.7 mg/dL (ref 8.9–10.3)
Chloride: 106 mmol/L (ref 98–111)
Creatinine, Ser: 1.24 mg/dL — ABNORMAL HIGH (ref 0.44–1.00)
GFR, Estimated: 45 mL/min — ABNORMAL LOW (ref >60)
Glucose, Bld: 145 mg/dL — ABNORMAL HIGH (ref 70–99)
Potassium: 5.8 mmol/L — ABNORMAL HIGH (ref 3.5–5.1)
Sodium: 136 mmol/L (ref 135–145)
Total Bilirubin: 0.3 mg/dL (ref 0.0–1.2)
Total Protein: 6.8 g/dL (ref 6.5–8.1)

## 2024-07-04 LAB — CBC
HCT: 29 % — ABNORMAL LOW (ref 36.0–46.0)
Hemoglobin: 9.8 g/dL — ABNORMAL LOW (ref 12.0–15.0)
MCH: 28 pg (ref 26.0–34.0)
MCHC: 33.8 g/dL (ref 30.0–36.0)
MCV: 82.9 fL (ref 80.0–100.0)
Platelets: 242 K/uL (ref 150–400)
RBC: 3.5 MIL/uL — ABNORMAL LOW (ref 3.87–5.11)
RDW: 14.3 % (ref 11.5–15.5)
WBC: 7.5 K/uL (ref 4.0–10.5)
nRBC: 0 % (ref 0.0–0.2)

## 2024-07-04 LAB — BASIC METABOLIC PANEL WITH GFR
BUN/Creatinine Ratio: 22 (ref 12–28)
BUN: 22 mg/dL (ref 8–27)
CO2: 14 mmol/L — ABNORMAL LOW (ref 20–29)
Calcium: 9.6 mg/dL (ref 8.7–10.3)
Chloride: 104 mmol/L (ref 96–106)
Creatinine, Ser: 1.02 mg/dL — ABNORMAL HIGH (ref 0.57–1.00)
Glucose: 98 mg/dL (ref 70–99)
Potassium: 6.3 mmol/L (ref 3.5–5.2)
Sodium: 132 mmol/L — ABNORMAL LOW (ref 134–144)
eGFR: 57 mL/min/1.73 — ABNORMAL LOW (ref >59)

## 2024-07-04 MED ORDER — SODIUM ZIRCONIUM CYCLOSILICATE 10 G PO PACK
10.0000 g | PACK | Freq: Once | ORAL | Status: AC
  Administered 2024-07-04: 10 g via ORAL
  Filled 2024-07-04: qty 1

## 2024-07-04 MED ORDER — DEXTROSE 50 % IV SOLN
25.0000 g | Freq: Once | INTRAVENOUS | Status: AC
  Administered 2024-07-04: 25 g via INTRAVENOUS
  Filled 2024-07-04: qty 50

## 2024-07-04 MED ORDER — AMLODIPINE BESYLATE 5 MG PO TABS: 5.0000 mg | ORAL_TABLET | Freq: Every day | ORAL | 0 refills | Status: DC

## 2024-07-04 MED ORDER — INSULIN ASPART 100 UNIT/ML IJ SOLN
4.0000 [IU] | Freq: Once | INTRAMUSCULAR | Status: AC
  Administered 2024-07-04: 4 [IU] via INTRAVENOUS
  Filled 2024-07-04: qty 4

## 2024-07-04 MED ORDER — SODIUM CHLORIDE 0.9 % IV BOLUS
500.0000 mL | Freq: Once | INTRAVENOUS | Status: AC
  Administered 2024-07-04: 500 mL via INTRAVENOUS

## 2024-07-04 MED ORDER — SODIUM BICARBONATE 8.4 % IV SOLN
50.0000 meq | Freq: Once | INTRAVENOUS | Status: AC
  Administered 2024-07-04: 50 meq via INTRAVENOUS
  Filled 2024-07-04: qty 50

## 2024-07-04 NOTE — Discharge Instructions (Addendum)
 It was our pleasure to provide your ER care today - we hope that you feel better.  Drink plenty of fluids/stay well hydrated.     Your cardiologist recommends that you: stop aldactone  and valsartan  HCT, reduce the dose of amlodipine  to 5 mg daily, and start taking BiDil 1 p.o. 3 times daily. For now, also hold/stop your nebivolol .  For leg swelling, elevated your legs as much as possible when not up and about, follow heart healthy eating plan, limit salt intake.   You may also consider using compression hose.    Have your potassium level rechecked tomorrow (by your doctor, or ED if not able to be seen by your doctor).   Return to ER if worse, new symptoms, acutely weak or fainting, chest pain, trouble breathing, or other concern.

## 2024-07-04 NOTE — ED Triage Notes (Signed)
 Pt declined interpreter- reports having blood drawn yesterday and informed of elevated potassium levels.   C/o generalized fatigue.

## 2024-07-04 NOTE — ED Notes (Signed)
 IV Removed

## 2024-07-04 NOTE — ED Notes (Signed)
 Patient reports feeling fatigued and tired frequently.

## 2024-07-04 NOTE — Progress Notes (Signed)
 Discussed with daughter and advised her to go to the ED to treat hyperkalemia which has persisted in spite of stopping aldactone .   Will confirm hyperkalemia and if present we will also discontinue valsartan  HCT.  She is also having side effects from amlodipine  with marked leg edema, advised him to reduce the dose of amlodipine  to 5 mg daily.  With regard to management of her hypertension, we can use BiDil 1 p.o. 3 times daily or 2 tablets p.o. 3 times daily and see how she does.  Will follow along.

## 2024-07-04 NOTE — ED Provider Notes (Signed)
 [place] [facility] Provider Note   CSN: [id number] Arrival date & time: 07/04/24  1113     Patient presents with: Abnormal Lab   Gloria Mckinney is a 78 y.o. female.   Pt with hx htn, presents after cardiologist indicated outpatient labs showed high potassium and to go to ED.  Pt increased has chronic htn and bil leg swelling - indicates cardiologist has been adjusting bp meds, and labs showed high K.  Does not feel acutely ill or sick. No chest pain or sob. No abd pain or nvd. Normal appetite. Urinating regularly. No fever or chills. No worsening swelling. No numbness/weakness.   The history is provided by the patient, a relative and medical records. A language interpreter was used (offered AV interpreter service, pt and family prefer to have family interpret).  Abnormal Lab      Prior to Admission medications   Medication Sig Start Date End Date Taking? Authorizing Provider  amLODipine  (NORVASC ) 5 MG tablet Take 1 tablet (5 mg total) by mouth daily. 07/04/24   Bernard Drivers, MD  anastrozole  (ARIMIDEX ) 1 MG tablet Take 1 tablet (1 mg total) by mouth daily. 02/17/23   Odean Potts, MD  aspirin  EC 81 MG tablet Take 81 mg by mouth daily.    [provider]  Calcium Carb-Cholecalciferol (CALCIUM + VITAMIN D3 PO) Take 1 tablet by mouth daily.    [provider]  ezetimibe  (ZETIA ) 10 MG tablet Take 1 tablet (10 mg total) by mouth daily. 05/03/24 08/01/24  Ladona Heinz, MD  furosemide (LASIX) 20 MG tablet Take 20 mg by mouth daily.    [provider]  insulin  degludec (TRESIBA) 100 UNIT/ML FlexTouch Pen Inject 30 Units into the skin daily. 06/19/22   [provider]  metFORMIN (GLUCOPHAGE) 1000 MG tablet Take 1,000 mg by mouth 2 (two) times daily with a meal.    [provider]  Nebivolol  HCl (BYSTOLIC ) 20 MG TABS Take 1 tablet (20 mg total) by mouth daily. 01/25/24   Ladona Heinz, MD  omeprazole  (PRILOSEC) 40 MG capsule Take 40 mg by mouth every morning.  09/14/16   [provider]  OZEMPIC, 0.25 OR 0.5 MG/DOSE, 2 MG/3ML SOPN Inject 0.5 mg into the skin once a week.    [provider]  simvastatin  (ZOCOR ) 40 MG tablet Take 40 mg by mouth daily. 09/24/16   [provider]    Allergies: Penicillins, Nitrofurantoin monohyd macro, Sitagliptin, Cephalexin, Latex, and Statins    Review of Systems  Constitutional:  Negative for fever.  Respiratory:  Negative for shortness of breath.   Cardiovascular:  Negative for chest pain.  Gastrointestinal:  Negative for abdominal pain, diarrhea and vomiting.  Genitourinary:  Negative for decreased urine volume, dysuria and flank pain.  Musculoskeletal:  Negative for back pain and neck pain.  Neurological:  Negative for weakness, numbness and headaches.    Updated Vital Signs BP (!) 122/58   Pulse 72   Temp 98.1 F (36.7 C) (Oral)   Resp 16   Ht 1.575 m (5' 2)   Wt 80.3 kg   SpO2 96%   BMI 32.37 kg/m   Physical Exam Vitals and nursing note reviewed.  Constitutional:      Appearance: Normal appearance. She is well-developed.  HENT:     Head: Atraumatic.     Nose: Nose normal.     Mouth/Throat:     Mouth: Mucous membranes are moist.  Eyes:  General: No scleral icterus.    Conjunctiva/sclera: Conjunctivae normal.  Neck:     Trachea: No tracheal deviation.  Cardiovascular:     Rate and Rhythm: Normal rate and regular rhythm.     Pulses: Normal pulses.     Heart sounds: Normal heart sounds. No murmur heard.    No friction rub. No gallop.  Pulmonary:     Effort: Pulmonary effort is normal. No respiratory distress.     Breath sounds: Normal breath sounds.  Abdominal:     General: Bowel sounds are normal. There is no distension.     Palpations: Abdomen is soft. There is no mass.     Tenderness: There is no abdominal tenderness.  Musculoskeletal:     Cervical back: Normal range of motion and neck  supple. No rigidity. No muscular tenderness.     Comments: Symmetric bil ankle and lower leg edema.   Skin:    General: Skin is warm and dry.     Findings: No rash.  Neurological:     Mental Status: She is alert.     Comments: Alert, speech normal. Motor/sens grossly intact bil.   Psychiatric:        Mood and Affect: Mood normal.     (all labs ordered are listed, but only abnormal results are displayed) Results for orders placed or performed during the hospital encounter of 07/04/24  CBC   Collection Time: 07/04/24 11:58 AM  Result Value Ref Range   WBC 7.5 4.0 - 10.5 K/uL   RBC 3.50 (L) 3.87 - 5.11 MIL/uL   Hemoglobin 9.8 (L) 12.0 - 15.0 g/dL   HCT 70.9 (L) 63.9 - 53.9 %   MCV 82.9 80.0 - 100.0 fL   MCH 28.0 26.0 - 34.0 pg   MCHC 33.8 30.0 - 36.0 g/dL   RDW 85.6 88.4 - 84.4 %   Platelets 242 150 - 400 K/uL   nRBC 0.0 0.0 - 0.2 %  Comprehensive metabolic panel with GFR   Collection Time: 07/04/24 11:58 AM  Result Value Ref Range   Sodium 136 135 - 145 mmol/L   Potassium 5.8 (H) 3.5 - 5.1 mmol/L   Chloride 106 98 - 111 mmol/L   CO2 18 (L) 22 - 32 mmol/L   Glucose, Bld 145 (H) 70 - 99 mg/dL   BUN 23 8 - 23 mg/dL   Creatinine, Ser 8.75 (H) 0.44 - 1.00 mg/dL   Calcium 9.7 8.9 - 89.6 mg/dL   Total Protein 6.8 6.5 - 8.1 g/dL   Albumin  4.1 3.5 - 5.0 g/dL   AST 15 15 - 41 U/L   ALT 13 0 - 44 U/L   Alkaline Phosphatase 76 38 - 126 U/L   Total Bilirubin 0.3 0.0 - 1.2 mg/dL   GFR, Estimated 45 (L) >60 mL/min   Anion gap 12 5 - 15  CBG monitoring, ED   Collection Time: 07/04/24  1:27 PM  Result Value Ref Range   Glucose-Capillary 163 (H) 70 - 99 mg/dL      EKG: EKG Interpretation Date/Time:  Tuesday July 04 2024 12:46:36 EST Ventricular Rate:  61 PR Interval:  209 QRS Duration:  88 QT Interval:  395 QTC Calculation: 398 R Axis:   32  Text Interpretation: Sinus rhythm Low voltage, precordial leads Confirmed by Bernard Drivers ([id number]) on 07/04/2024 1:33:59  PM  Radiology: No results found.   Procedures   Medications Ordered in the ED  sodium chloride  0.9 % bolus 500 mL (0 mLs  Intravenous Stopped 07/04/24 1323)  sodium bicarbonate  injection 50 mEq (50 mEq Intravenous Given 07/04/24 1249)  dextrose  50 % solution 25 g (25 g Intravenous Given 07/04/24 1249)  insulin  aspart (novoLOG ) injection 4 Units (4 Units Intravenous Given 07/04/24 1248)  sodium zirconium cyclosilicate  (LOKELMA ) packet 10 g (10 g Oral Given 07/04/24 1252)                                    Medical Decision Making Problems Addressed: History of chronic hypertension: chronic illness or injury Hyperkalemia: acute illness or injury with systemic symptoms that poses a threat to life or bodily functions Peripheral edema: chronic illness or injury  Amount and/or Complexity of Data Reviewed Independent Historian:     Details: Family/daughter, hx External Data Reviewed: labs and notes. Labs: ordered. Decision-making details documented in ED Course. ECG/medicine tests: ordered.  Risk Prescription drug management. Decision regarding hospitalization.   Iv ns. Continuous pulse ox and cardiac monitoring. Labs ordered/sent.   Differential diagnosis includes hyperkalemia, aki, etc. Dispo decision including potential need for admission considered - will get labs and reassess.   Reviewed nursing notes and prior charts for additional history. External reports reviewed. Additional history from: family.   Recent cardiology recommends per note below related to recent visit, bp, swelling, and high k:  Discussed with daughter and advised her to go to the ED to treat hyperkalemia which has persisted in spite of stopping aldactone .    Will confirm hyperkalemia and if present we will also discontinue valsartan  HCT.   She is also having side effects from amlodipine  with marked leg edema, advised him to reduce the dose of amlodipine  to 5 mg daily.   With regard to management of her  hypertension, we can use BiDil 1 p.o. 3 times daily or 2 tablets p.o. 3 times daily and see how she does.  Will follow along.  Cardiac monitor: sinus rhythm, rate 66.  Labs reviewed/interpreted by me - k high 5.8. lokelma  po, hco3 iv, d50 iv, novolog  iv, ivf.   Repeat bmet ordered, plan to recheck k. While waiting for repeat bmet, pt was inadvertently discharged by staff - when I discovered this had occurred, I called patient/patients daughter who was in ED - they have already returned home, and pt doing fine (and they did get instructions, AVS, from nurse)   I discussed need to return tomorrow AM for recheck, recheck bmet/k - daughter voices understanding and they will return tomorrow AM between 7 AM and 9 AM.   Rec close pcp/cardiology f/u. Recheck labs/bmet tomorrow.   Return precautions provided.   CRITICAL CARE RE: hyperkalemia Performed by: Deleah Tison E Farrel Guimond Total critical care time: 40 minutes Critical care time was exclusive of separately billable procedures and treating other patients. Critical care was necessary to treat or prevent imminent or life-threatening deterioration. Critical care was time spent personally by me on the following activities: development of treatment plan with patient and/or surrogate as well as nursing, discussions with consultants, evaluation of patient's response to treatment, examination of patient, obtaining history from patient or surrogate, ordering and performing treatments and interventions, ordering and review of laboratory studies, ordering and review of radiographic studies, pulse oximetry and re-evaluation of patient's condition.       Final diagnoses:  Hyperkalemia    ED Discharge Orders          Ordered    amLODipine  (NORVASC ) 5 MG tablet  Daily        07/04/24 1412               Bernard Drivers, MD 07/04/24 1556

## 2024-07-05 ENCOUNTER — Encounter (HOSPITAL_BASED_OUTPATIENT_CLINIC_OR_DEPARTMENT_OTHER): Payer: Self-pay

## 2024-07-05 ENCOUNTER — Emergency Department (HOSPITAL_BASED_OUTPATIENT_CLINIC_OR_DEPARTMENT_OTHER)
Admission: EM | Admit: 2024-07-05 | Discharge: 2024-07-05 | Disposition: A | Discharge status: Home / Self Care | Source: Ambulatory Visit | Attending: Emergency Medicine | Admitting: Emergency Medicine

## 2024-07-05 DIAGNOSIS — Z91040 Latex allergy status: Secondary | ICD-10-CM | POA: Insufficient documentation

## 2024-07-05 DIAGNOSIS — Z79899 Other long term (current) drug therapy: Secondary | ICD-10-CM | POA: Insufficient documentation

## 2024-07-05 DIAGNOSIS — R600 Localized edema: Secondary | ICD-10-CM | POA: Insufficient documentation

## 2024-07-05 DIAGNOSIS — Z794 Long term (current) use of insulin: Secondary | ICD-10-CM | POA: Insufficient documentation

## 2024-07-05 DIAGNOSIS — Z7982 Long term (current) use of aspirin: Secondary | ICD-10-CM | POA: Diagnosis not present

## 2024-07-05 DIAGNOSIS — I10 Essential (primary) hypertension: Secondary | ICD-10-CM | POA: Diagnosis not present

## 2024-07-05 LAB — BASIC METABOLIC PANEL WITH GFR
Anion gap: 12 (ref 5–15)
BUN: 18 mg/dL (ref 8–23)
CO2: 19 mmol/L — ABNORMAL LOW (ref 22–32)
Calcium: 9.6 mg/dL (ref 8.9–10.3)
Chloride: 104 mmol/L (ref 98–111)
Creatinine, Ser: 1.07 mg/dL — ABNORMAL HIGH (ref 0.44–1.00)
GFR, Estimated: 54 mL/min — ABNORMAL LOW (ref >60)
Glucose, Bld: 173 mg/dL — ABNORMAL HIGH (ref 70–99)
Potassium: 4.8 mmol/L (ref 3.5–5.1)
Sodium: 135 mmol/L (ref 135–145)

## 2024-07-05 LAB — CBC WITH DIFFERENTIAL/PLATELET
Abs Immature Granulocytes: 0.02 K/uL (ref 0.00–0.07)
Basophils Absolute: 0 K/uL (ref 0.0–0.1)
Basophils Relative: 0 %
Eosinophils Absolute: 0.1 K/uL (ref 0.0–0.5)
Eosinophils Relative: 2 %
HCT: 28.7 % — ABNORMAL LOW (ref 36.0–46.0)
Hemoglobin: 9.5 g/dL — ABNORMAL LOW (ref 12.0–15.0)
Immature Granulocytes: 0 %
Lymphocytes Relative: 27 %
Lymphs Abs: 1.8 K/uL (ref 0.7–4.0)
MCH: 27.8 pg (ref 26.0–34.0)
MCHC: 33.1 g/dL (ref 30.0–36.0)
MCV: 83.9 fL (ref 80.0–100.0)
Monocytes Absolute: 0.6 K/uL (ref 0.1–1.0)
Monocytes Relative: 9 %
Neutro Abs: 4 K/uL (ref 1.7–7.7)
Neutrophils Relative %: 62 %
Platelets: 245 K/uL (ref 150–400)
RBC: 3.42 MIL/uL — ABNORMAL LOW (ref 3.87–5.11)
RDW: 14 % (ref 11.5–15.5)
WBC: 6.6 K/uL (ref 4.0–10.5)
nRBC: 0 % (ref 0.0–0.2)

## 2024-07-05 NOTE — ED Triage Notes (Signed)
 Per pt's daughter, pt is back to have bloodwork drawn to recheck. Was recently here and was told to have bloodwork rechecked.

## 2024-07-05 NOTE — ED Provider Notes (Signed)
 [facility] [place] Provider Note   CSN: [id number] Arrival date & time: 07/05/24  9094     Patient presents with: Labs Only   Gloria Mckinney is a 78 y.o. female.   78 year old female here for a recheck of her potassium.  Patient was seen yesterday and was found to have an elevated potassium of 5.8, she was contacted by the provider who evaluated her in the ED yesterday and told to return today for recheck of her potassium.  She has had multiple medication changes recently and an attempt to regulate her potassium, her amlodipine  was also reduced as it is believed this may be contributing to her bilateral lower extremity edema.  She notes some improvement with elevation of her legs, she prefers not to wear compression stockings.  She otherwise feels in her usual state of health and has no complaints.  She is scheduled to see her PCP in December and her cardiologist in January.        Prior to Admission medications   Medication Sig Start Date End Date Taking? Authorizing Provider  amLODipine  (NORVASC ) 5 MG tablet Take 1 tablet (5 mg total) by mouth daily. 07/04/24   Bernard Drivers, MD  anastrozole  (ARIMIDEX ) 1 MG tablet Take 1 tablet (1 mg total) by mouth daily. 02/17/23   Gudena, Vinay, MD  aspirin  EC 81 MG tablet Take 81 mg by mouth daily.    [provider]  Calcium Carb-Cholecalciferol (CALCIUM + VITAMIN D3 PO) Take 1 tablet by mouth daily.    [provider]  ezetimibe  (ZETIA ) 10 MG tablet Take 1 tablet (10 mg total) by mouth daily. 05/03/24 08/01/24  Ladona Heinz, MD  furosemide (LASIX) 20 MG tablet Take 20 mg by mouth daily.    [provider]  insulin  degludec (TRESIBA) 100 UNIT/ML FlexTouch Pen Inject 30 Units into the skin daily. 06/19/22   [provider]  metFORMIN (GLUCOPHAGE) 1000 MG tablet Take 1,000 mg by mouth 2 (two) times daily with a meal.    [provider]  Nebivolol  HCl  (BYSTOLIC ) 20 MG TABS Take 1 tablet (20 mg total) by mouth daily. 01/25/24   Ladona Heinz, MD  omeprazole (PRILOSEC) 40 MG capsule Take 40 mg by mouth every morning.  09/14/16   [provider]  OZEMPIC, 0.25 OR 0.5 MG/DOSE, 2 MG/3ML SOPN Inject 0.5 mg into the skin once a week.    [provider]  simvastatin  (ZOCOR ) 40 MG tablet Take 40 mg by mouth daily. 09/24/16   [provider]    Allergies: Penicillins, Nitrofurantoin monohyd macro, Sitagliptin, Cephalexin, Latex, and Statins    Review of Systems  Updated Vital Signs  Vitals:   07/05/24 0911 07/05/24 0916  BP: 139/76   Pulse: 69   Resp: 16   Temp: 97.9 F (36.6 C)   TempSrc: Oral   SpO2: 93%   Weight:  80.3 kg  Height:  5' 2 (1.575 m)     Physical Exam Vitals and nursing note reviewed.  HENT:     Head: Normocephalic.  Eyes:     Extraocular Movements: Extraocular movements intact.  Cardiovascular:     Rate and Rhythm: Normal rate and regular rhythm.     Heart sounds: Normal heart sounds.  Pulmonary:     Effort: Pulmonary effort is normal.     Breath sounds: Normal breath sounds.  Musculoskeletal:     Cervical back: Normal range of motion.     Right  lower leg: Edema present.     Left lower leg: Edema present.     Comments: 1-2+ pitting edema bilateral lower extremities Moves all extremities spontaneously without difficulty  Skin:    General: Skin is warm and dry.  Neurological:     Mental Status: She is alert and oriented to person, place, and time.     (all labs ordered are listed, but only abnormal results are displayed) Labs Reviewed  CBC WITH DIFFERENTIAL/PLATELET - Abnormal; Notable for the following components:      Result Value   RBC 3.42 (*)    Hemoglobin 9.5 (*)    HCT 28.7 (*)    All other components within normal limits  BASIC METABOLIC PANEL WITH GFR - Abnormal; Notable for the following components:   CO2 19 (*)    Glucose, Bld 173 (*)    Creatinine, Ser 1.07 (*)     GFR, Estimated 54 (*)    All other components within normal limits    EKG: None  Radiology: No results found.   Procedures   Medications Ordered in the ED - No data to display                                  Medical Decision Making This patient presents to the ED for concern of recheck of labs, this involves an extensive number of treatment options, and is a complaint that carries with it a high risk of complications and morbidity.  The differential diagnosis includes hyperkalemia, hypokalemia, low serum bicarb, dependent edema versus venous insufficiency   Co morbidities that complicate the patient evaluation  Hypertension, hyperkalemia noted on lab work this week   Additional history obtained:  Additional history obtained from record review External records from outside source obtained and reviewed including ED note from yesterday   Lab Tests:  I Ordered, and personally interpreted labs.  The pertinent results include: CBC largely stable from previous with hemoglobin of 9.5.  BMP notable for potassium of 4.8 which is down from yesterday as result of 5.8, creatinine improved at 1.07, bicarb of 19 is improved.   Cardiac Monitoring: / EKG:  The patient was maintained on a cardiac monitor.  I personally viewed and interpreted the cardiac monitored which showed an underlying rhythm of: NSR   Problem List / ED Course / Critical interventions / Medication management  I have reviewed the patients home medicines and have made adjustments as needed  Test / Admission - Considered:  Patient was seen in this emergency department yesterday and was found to be hyperkalemic with a potassium of 5.8, the plan was to recheck the patient's potassium after she received IV fluids/bicarb/dextrose /insulin /Lokelma , however patient was inadvertently discharged before a repeat BMP could be collected.  Patient was contacted by Dr. Bernard after discharge yesterday and was instructed to return  to the emergency department today for a recheck of her potassium. Potassium has normalized today to 4.8.  Patient does continue to have persistent bilateral lower extremity pitting edema, her amlodipine  was recently adjusted in an attempt to lessen this.  I discussed with her that this is likely due to dependent edema versus venous insufficiency, I recommend that she decrease her salt intake, elevate her legs as she is able, and use compression stockings to help alleviate the swelling.  She is scheduled to see her PCP on December 19, I recommend that she contact their office this afternoon to determine  if a sooner follow-up is warranted for a recheck of her potassium.  She and her daughter voiced understanding and are in agreement with this plan, return precautions discussed, she is appropriate for discharge at this time.  Staffed with Dr. Ruthe  Amount and/or Complexity of Data Reviewed Labs: ordered.        Final diagnoses:  Lower extremity edema    ED Discharge Orders     None          Glendia Rocky SAILOR, [place] 07/05/24 1014    Ruthe Cornet, DO 07/05/24 1045

## 2024-07-05 NOTE — Discharge Instructions (Addendum)
 Your potassium today is within normal limits.  Please contact your primary care provider to determine if a sooner follow-up is necessary for a re-check of your potassium, as they may want to see you sooner than your December 19th follow-up.  Continue to limit salt intake, elevate your legs, and use compression stockings to help alleviate your lower extremity edema.  Amlodipine  may also be contributing to your lower extremity edema, your dosage of this medication was recently reduced, please discuss this further with your PCP.  Return to the emergency department if your symptoms worsen.

## 2024-07-10 ENCOUNTER — Telehealth: Payer: Self-pay | Admitting: Cardiology

## 2024-07-10 NOTE — Telephone Encounter (Signed)
 Pt c/o swelling: STAT is pt has developed SOB within 24 hours  How much weight have you gained and in what time span?  About 5 lbs in 1 week  If swelling, where is the swelling located?  Legs   Are you currently taking a fluid pill?  Yes--furosemide + HCTZ  Are you currently SOB?   Do you have a log of your daily weights (if so, list)?  around 180 lbs currently  Have you gained 3 pounds in a day or 5 pounds in a week?   Have you traveled recently?  No

## 2024-07-10 NOTE — Telephone Encounter (Signed)
*  STAT* If patient is at the pharmacy, call can be transferred to refill team.   1. Which medications need to be refilled? (please list name of each medication and dose if known)  BiDil 1 p.o. 3 times daily or 2 tablets p.o   2. Which pharmacy/location (including street and city if local pharmacy) is medication to be sent to?  [facility] [address] [place], [place] - [address]  3. Do they need a 30 day or 90 day supply?   90 day supply  See previous encounter. Prescription was never sent in.

## 2024-07-11 NOTE — Telephone Encounter (Signed)
 I am getting confused about her management, best to bring her in to be evaluated.

## 2024-07-11 NOTE — Telephone Encounter (Signed)
 Spoke with pt's daughter, DPR who states pt continues to struggle with LEE.  Pt has not weighed today but reports weight up about 5 pounds in the last week.  She does not have blood pressure readings to provide.  She is taking medications as prescribed but states medication Dr Ladona wanted her to start was never called to pharmacy.  She denies current CP or SOB.   Advised will forward for Dr Godfrey review and further recommendation.  Pt's daughter verbalizes understanding and thanked CHARITY FUNDRAISER for the phone call.

## 2024-07-12 NOTE — Telephone Encounter (Signed)
 Pt scheduled 12/4 at 11:30 with Dr. Ladona.

## 2024-07-12 NOTE — Telephone Encounter (Signed)
 Patient has been scheduled for 12/4 with Dr. Ganji

## 2024-07-13 ENCOUNTER — Encounter: Payer: Self-pay | Admitting: Cardiology

## 2024-07-13 ENCOUNTER — Other Ambulatory Visit (HOSPITAL_COMMUNITY): Payer: Self-pay

## 2024-07-13 ENCOUNTER — Telehealth: Payer: Self-pay | Admitting: Pharmacy Technician

## 2024-07-13 ENCOUNTER — Ambulatory Visit: Attending: Cardiology | Admitting: Cardiology

## 2024-07-13 ENCOUNTER — Other Ambulatory Visit: Payer: Self-pay

## 2024-07-13 VITALS — BP 152/68 | HR 83 | Resp 16 | Ht 62.0 in | Wt 184.1 lb

## 2024-07-13 DIAGNOSIS — I872 Venous insufficiency (chronic) (peripheral): Secondary | ICD-10-CM | POA: Diagnosis not present

## 2024-07-13 DIAGNOSIS — E7800 Pure hypercholesterolemia, unspecified: Secondary | ICD-10-CM | POA: Diagnosis not present

## 2024-07-13 DIAGNOSIS — N1831 Chronic kidney disease, stage 3a: Secondary | ICD-10-CM | POA: Insufficient documentation

## 2024-07-13 DIAGNOSIS — I1A0 Resistant hypertension: Secondary | ICD-10-CM | POA: Diagnosis not present

## 2024-07-13 DIAGNOSIS — E1122 Type 2 diabetes mellitus with diabetic chronic kidney disease: Secondary | ICD-10-CM | POA: Insufficient documentation

## 2024-07-13 MED ORDER — AMLODIPINE BESYLATE 10 MG PO TABS: 10.0000 mg | ORAL_TABLET | Freq: Every day | ORAL | Status: AC

## 2024-07-13 MED ORDER — NEBIVOLOL HCL 20 MG PO TABS
20.0000 mg | ORAL_TABLET | Freq: Every day | ORAL | 6 refills | Status: AC
  Filled 2024-07-13 (×2): qty 30, 30d supply, fill #0

## 2024-07-13 MED ORDER — VALSARTAN 40 MG PO TABS
40.0000 mg | ORAL_TABLET | Freq: Every day | ORAL | 0 refills | Status: DC
  Filled 2024-07-13: qty 30, 30d supply, fill #0

## 2024-07-13 MED ORDER — MOUNJARO 2.5 MG/0.5ML ~~LOC~~ SOAJ
2.5000 mg | SUBCUTANEOUS | 1 refills | Status: AC
  Filled 2024-07-13: qty 2, 28d supply, fill #0

## 2024-07-13 MED ORDER — NEBIVOLOL HCL 20 MG PO TABS: 20.0000 mg | ORAL_TABLET | Freq: Every day | ORAL | Status: DC

## 2024-07-13 NOTE — Telephone Encounter (Signed)
   I called the granddaughter and she told me call the daughter   They are aware

## 2024-07-13 NOTE — Addendum Note (Signed)
 Addended by: GRETEL MAEOLA CROME on: 07/13/2024 01:14 PM   Modules accepted: Orders

## 2024-07-13 NOTE — Telephone Encounter (Signed)
 Too soon   NEXT AVAILABLE FILL DATE [id number] LAST FILL DT [id number] FILLED AT PHARMACY  Spoke to daughter

## 2024-07-13 NOTE — Progress Notes (Signed)
 Cardiology Office Note:  .   Date:  07/13/2024  ID:  Gloria Mckinney, DOB 08/14/1945, MRN [id number] PCP: Chrystal Lamarr RAMAN, MD  [facility] Providers Cardiologist:  Gordy Bergamo, MD   History of Present Illness: .   Gloria Mckinney is a 78 y.o. Asian Indian female patient with hypertension, hypercholesterolemia, diabetes mellitus, peripheral arterial disease, breast cancer SP left breast lumpectomy followed by chemo and XRT therapy in 2020 presents for management of resistant hypertension.  Chronic leg edema has been an issue, in spite of discontinuing amlodipine , leg edema persisted.  On a prior office visit on 05/03/2024, I had recommended adding spironolactone , advised her to increase her Ozempic as her diabetes was not well-controlled and also added Zetia  in view of uncontrolled hypercholesterolemia.  Due to hypokalemia, spironolactone  was discontinued and in spite of this she continued to have elevated potassium levels hence valsartan  HCT 160/12.5 mg was discontinued and she was also advised to go to the emergency room.  She received Lasix and discharged home.  She has now been off of spironolactone  and valsartan  HCT.  She has had difficulty in controlling blood pressure and continued leg edema and leg discomfort.  She is accompanied by her daughter.  Denies chest pain, shortness of breath, PND or orthopnea.  Cardiac Studies relevent.    ECHOCARDIOGRAM COMPLETE 01/14/2024  1. Left ventricular ejection fraction, by estimation, is 60 to 65%. Left ventricular ejection fraction by 3D volume is 68 %. The left ventricle has normal function. The left ventricle has no regional wall motion abnormalities. There is mild concentric left ventricular hypertrophy. Left ventricular diastolic parameters are consistent with Grade I diastolic dysfunction (impaired relaxation). 2. Right ventricular systolic function is normal. The right ventricular size is normal.  Labs   No  results found for: CHOL, HDL, LDLCALC, LDLDIRECT, TRIG, CHOLHDL No results found for: LIPOA  Recent Labs    09/27/23 0554 01/26/24 1127 07/03/24 1138 07/04/24 1158 07/05/24 0919  NA 132*   < > 132* 136 135  K 3.9   < > 6.3* 5.8* 4.8  CL 97*   < > 104 106 104  CO2 24   < > 14* 18* 19*  GLUCOSE 139*   < > 98 145* 173*  BUN 13   < > 22 23 18   CREATININE 1.03*   < > 1.02* 1.24* 1.07*  CALCIUM 9.5   < > 9.6 9.7 9.6  GFRNONAA 57*  --   --  45* 54*   < > = values in this interval not displayed.    Lab Results  Component Value Date   ALT 13 07/04/2024   AST 15 07/04/2024   ALKPHOS 76 07/04/2024   BILITOT 0.3 07/04/2024      Latest Ref Rng & Units 07/05/2024    9:19 AM 07/04/2024   11:58 AM 09/27/2023    5:54 AM  CBC  WBC 4.0 - 10.5 K/uL 6.6  7.5  12.6   Hemoglobin 12.0 - 15.0 g/dL 9.5  9.8  9.9   Hematocrit 36.0 - 46.0 % 28.7  29.0  29.0   Platelets 150 - 400 K/uL 245  242  241    Lab Results  Component Value Date   HGBA1C 7.3 (H) 09/25/2023    No results found for: TSH    ROS  Review of Systems  Cardiovascular:  Positive for leg swelling (chronic). Negative for chest pain and dyspnea on exertion.   Physical Exam:   VS:  BP ROLLEN)  152/68 (BP Location: Left Arm, Patient Position: Sitting, Cuff Size: Large)   Pulse 83   Resp 16   Ht 5' 2 (1.575 m)   Wt 184 lb 1.6 oz (83.5 kg)   SpO2 96%   BMI 33.67 kg/m    Wt Readings from Last 3 Encounters:  07/13/24 184 lb 1.6 oz (83.5 kg)  07/05/24 177 lb (80.3 kg)  07/04/24 177 lb (80.3 kg)    BP Readings from Last 3 Encounters:  07/13/24 (!) 152/68  07/05/24 139/76  07/04/24 (!) 122/58   Physical Exam Neck:     Vascular: No carotid bruit or JVD.  Cardiovascular:     Rate and Rhythm: Normal rate and regular rhythm.     Pulses: Intact distal pulses.     Heart sounds: Normal heart sounds. No murmur heard.    No gallop.  Pulmonary:     Effort: Pulmonary effort is normal.     Breath sounds: Normal  breath sounds.  Abdominal:     General: Bowel sounds are normal.     Palpations: Abdomen is soft.  Musculoskeletal:     Right lower leg: Edema (2-3+ pitting below-knee) present.     Left lower leg: Edema (2-3+ pitting below-knee) present.    EKG:         ASSESSMENT AND PLAN: .      ICD-10-CM   1. Resistant hypertension  I1A.0 Nebivolol  HCl (BYSTOLIC ) 20 MG TABS    amLODipine  (NORVASC ) 10 MG tablet    valsartan  (DIOVAN ) 40 MG tablet    2. Type 2 diabetes mellitus with stage 3a chronic kidney disease, without long-term current use of insulin  (HCC)  E11.22 tirzepatide (MOUNJARO) 2.5 MG/0.5ML Pen   N18.31     3. Venous insufficiency of leg Bilateral  I87.2 Ambulatory referral to Vascular Surgery    4. Hypercholesterolemia  E78.00      1. Resistant hypertension (Primary) Patient's medications have been ordered significantly and hence I brought the patient in to reconcile medications and to reapproach her hypertension management.  She had discontinued Nebivolol .  I have reintroduced Nebivolol  20 mg daily, increased amlodipine  from 5 mg to 10 mg and for cardiovascular, renal protection, I will place her on 40 mg of valsartan , she has an appointment to see Dr. Lamarr Rotunda on 08/20/2024 at which point patient's daughter wishes to have blood work, BMP done. If blood pressure continues to be uncontrolled, she will MyChart message me, I will try Catapres patch 0.1-0.2 mg weekly.  - Nebivolol  HCl (BYSTOLIC ) 20 MG TABS; Take 1 tablet (20 mg total) by mouth daily. - amLODipine  (NORVASC ) 10 MG tablet; Take 1 tablet (10 mg total) by mouth daily. - valsartan  (DIOVAN ) 40 MG tablet; Take 1 tablet (40 mg total) by mouth daily.  Dispense: 30 tablet; Refill: 0  2. Type 2 diabetes mellitus with stage 3a chronic kidney disease, without long-term current use of insulin  (HCC) I had prescribed her Ozempic for weight loss and also for diabetes control and for renal protection.  Patient had usual side  effects of nausea and lack of appetite hence discontinued but would like to try Mounjaro.  Rx sent for 1 month supply.  If she tolerates this, if appropriate, she can follow-up with PCP for further dose escalation and refills.  - tirzepatide (MOUNJARO) 2.5 MG/0.5ML Pen; Inject 2.5 mg into the skin once a week.  Dispense: 2 mL; Refill: 1  3. Venous insufficiency of leg Bilateral Her leg edema is unrelated to amlodipine .  I reviewed her vascular study, she has venous insufficiency and although only 1 leg was studied, she has bilateral venous insufficiency as evidenced by skin with discoloration and complete disappearance of edema in recumbent position.  Patient having difficulty wearing support stockings. - Ambulatory referral to Vascular Surgery - I again discussed with her using a mechanical aid to wear support stockings.  4. Hypercholesterolemia In view of diabetes mellitus, our goal LDL is <70.  She needs lipid profile testing when she visits her PCP as well.  I had introduced Zetia  and she has been tolerating this along with statin.  Follow up: 3 months for follow-up of hypertension.  Signed,  Gordy Bergamo, MD, [facility] 07/13/2024, 12:41 PM [facility] [address] [place], [place] [address] Phone: [phone]. Fax:  [phone]

## 2024-07-13 NOTE — Telephone Encounter (Signed)
 Pt went to pickup her rx bystolic  20 mg daily downstairs at the [facility] & they told the pt that they are waiting for Ganji to sign off on the rx. Please advise.

## 2024-07-13 NOTE — Patient Instructions (Addendum)
 Medication Instructions:  Please START amlodipine  10 mg daily.  Please START bystolic  20 mg daily.   Please START Mounjaro weekly. STOP Ozempic.  Please START valsartan  40 mg daily.    *If you need a refill on your cardiac medications before your next appointment, please call your pharmacy*  Lab Work: None.  If you have labs (blood work) drawn today and your tests are completely normal, you will receive your results only by: MyChart Message (if you have MyChart) OR A paper copy in the mail If you have any lab test that is abnormal or we need to change your treatment, we will call you to review the results.  Testing/Procedures: None.  Follow-Up: At [facility], you and your health needs are our priority.  As part of our continuing mission to provide you with exceptional heart care, our providers are all part of one team.  This team includes your primary Cardiologist (physician) and Advanced Practice Providers or APPs (Physician Assistants and Nurse Practitioners) who all work together to provide you with the care you need, when you need it.  Your next appointment:   3 month(s)  Provider:   Gordy Bergamo, MD    We recommend signing up for the patient portal called MyChart.  Sign up information is provided on this After Visit Summary.  MyChart is used to connect with patients for Virtual Visits (Telemedicine).  Patients are able to view lab/test results, encounter notes, upcoming appointments, etc.  Non-urgent messages can be sent to your provider as well.   To learn more about what you can do with MyChart, go to [email or web address].   Other Instructions You have been referred to Vascular Surgery. Someone from that office will call to schedule an appointment.    Please have your PCP send us  a copy of your lipid levels and your BMP once you have them done.

## 2024-07-14 ENCOUNTER — Encounter: Admit: 2024-07-14 | Payer: PRIVATE HEALTH INSURANCE | Attending: Internal Medicine | Primary: Internal Medicine

## 2024-07-14 DIAGNOSIS — E1169 Type 2 diabetes mellitus with other specified complication: Principal | ICD-10-CM

## 2024-07-14 MED ORDER — TRADJENTA 5 MG TABLET
5 | ORAL_TABLET | Freq: Every day | ORAL | 4 refills | 7.00000 days | Status: AC
Start: 2024-07-14 — End: ?

## 2024-07-19 ENCOUNTER — Inpatient Hospital Stay: Admit: 2024-07-19 | Discharge: 2024-07-19 | Payer: MEDICARE | Primary: Internal Medicine

## 2024-07-19 ENCOUNTER — Ambulatory Visit: Admit: 2024-07-19 | Payer: PRIVATE HEALTH INSURANCE | Attending: Internal Medicine | Primary: Internal Medicine

## 2024-07-19 ENCOUNTER — Encounter: Admit: 2024-07-19 | Payer: PRIVATE HEALTH INSURANCE | Attending: Internal Medicine | Primary: Internal Medicine

## 2024-07-19 VITALS — BP 124/80 | HR 100 | Temp 97.90000°F | Resp 16 | Ht <= 58 in | Wt 121.0 lb

## 2024-07-19 DIAGNOSIS — E785 Hyperlipidemia, unspecified: Secondary | ICD-10-CM

## 2024-07-19 DIAGNOSIS — I10 Essential (primary) hypertension: Secondary | ICD-10-CM

## 2024-07-19 DIAGNOSIS — M533 Sacrococcygeal disorders, not elsewhere classified: Secondary | ICD-10-CM

## 2024-07-19 DIAGNOSIS — E1169 Type 2 diabetes mellitus with other specified complication: Secondary | ICD-10-CM

## 2024-07-19 DIAGNOSIS — M25562 Pain in left knee: Principal | ICD-10-CM

## 2024-07-19 DIAGNOSIS — M25561 Pain in right knee: Secondary | ICD-10-CM

## 2024-07-19 DIAGNOSIS — D649 Anemia, unspecified: Principal | ICD-10-CM

## 2024-07-20 ENCOUNTER — Inpatient Hospital Stay: Admit: 2024-07-20 | Discharge: 2024-07-20 | Payer: MEDICARE | Primary: Internal Medicine

## 2024-07-20 ENCOUNTER — Encounter: Admit: 2024-07-20 | Payer: PRIVATE HEALTH INSURANCE | Primary: Internal Medicine

## 2024-07-20 ENCOUNTER — Encounter: Admit: 2024-07-20 | Payer: PRIVATE HEALTH INSURANCE | Attending: Internal Medicine | Primary: Internal Medicine

## 2024-07-20 DIAGNOSIS — I10 Essential (primary) hypertension: Principal | ICD-10-CM

## 2024-07-20 DIAGNOSIS — D649 Anemia, unspecified: Principal | ICD-10-CM

## 2024-07-20 DIAGNOSIS — E785 Hyperlipidemia, unspecified: Secondary | ICD-10-CM

## 2024-07-20 DIAGNOSIS — E538 Deficiency of other specified B group vitamins: Secondary | ICD-10-CM

## 2024-07-20 DIAGNOSIS — E1169 Type 2 diabetes mellitus with other specified complication: Secondary | ICD-10-CM

## 2024-07-20 DIAGNOSIS — E038 Other specified hypothyroidism: Secondary | ICD-10-CM

## 2024-07-20 LAB — CBC WITH AUTO DIFFERENTIAL
BKR WAM ABSOLUTE IMMATURE GRANULOCYTES.: 0.02 x 1000/ÂµL (ref 0.00–0.30)
BKR WAM ABSOLUTE LYMPHOCYTE COUNT.: 1.66 x 1000/ÂµL (ref 0.60–3.70)
BKR WAM ABSOLUTE NRBC: 0 x 1000/ÂµL (ref 0.00–1.00)
BKR WAM ANC (ABSOLUTE NEUTROPHIL COUNT): 3.73 x 1000/ÂµL (ref 2.00–7.60)
BKR WAM BASOPHIL ABSOLUTE COUNT.: 0.04 x 1000/ÂµL (ref 0.00–1.00)
BKR WAM BASOPHILS: 0.7 % (ref 0.0–1.4)
BKR WAM EOSINOPHIL ABSOLUTE COUNT.: 0.19 x 1000/ÂµL (ref 0.00–1.00)
BKR WAM EOSINOPHILS: 3.1 % (ref 0.0–5.0)
BKR WAM HEMATOCRIT: 33.5 % — ABNORMAL LOW (ref 35.00–45.00)
BKR WAM HEMOGLOBIN: 10.3 g/dL — ABNORMAL LOW (ref 11.7–15.5)
BKR WAM IMMATURE GRANULOCYTES: 0.3 % (ref 0.0–1.0)
BKR WAM LYMPHOCYTES: 27.3 % (ref 17.0–50.0)
BKR WAM MCH: 28.6 pg (ref 27.0–33.0)
BKR WAM MCHC: 30.7 g/dL — ABNORMAL LOW (ref 31.0–36.0)
BKR WAM MCV: 93.1 fL (ref 80.0–100.0)
BKR WAM MONOCYTE ABSOLUTE COUNT.: 0.44 x 1000/ÂµL (ref 0.00–1.00)
BKR WAM MONOCYTES: 7.2 % (ref 4.0–12.0)
BKR WAM MPV: 11.1 fL (ref 8.0–12.0)
BKR WAM NEUTROPHILS: 61.4 % (ref 39.0–72.0)
BKR WAM NUCLEATED RED BLOOD CELLS: 0 % (ref 0.0–1.0)
BKR WAM PLATELETS: 287 x1000/ÂµL (ref 150–420)
BKR WAM RDW-CV: 13.3 % (ref 11.0–15.0)
BKR WAM RED BLOOD CELL COUNT.: 3.6 M/ÂµL — ABNORMAL LOW (ref 4.00–6.00)
BKR WAM WHITE BLOOD CELL COUNT: 6.1 x1000/ÂµL (ref 4.0–11.0)

## 2024-07-20 LAB — COMPREHENSIVE METABOLIC PANEL
BKR A/G RATIO: 1.5 (ref 1.0–2.2)
BKR ALANINE AMINOTRANSFERASE (ALT): 25 U/L (ref 10–35)
BKR ALBUMIN: 4.1 g/dL (ref 3.6–5.1)
BKR ALKALINE PHOSPHATASE: 51 U/L (ref 9–122)
BKR ANION GAP: 10 (ref 7–17)
BKR ASPARTATE AMINOTRANSFERASE (AST): 29 U/L (ref 10–35)
BKR AST/ALT RATIO: 1.2
BKR BILIRUBIN TOTAL: 0.4 mg/dL (ref <=1.2)
BKR BLOOD UREA NITROGEN: 19 mg/dL (ref 8–23)
BKR BUN / CREAT RATIO: 17.8 (ref 8.0–23.0)
BKR CALCIUM: 9.3 mg/dL (ref 8.8–10.2)
BKR CHLORIDE: 104 mmol/L (ref 98–107)
BKR CO2: 23 mmol/L (ref 20–30)
BKR CREATININE: 1.07 mg/dL (ref 0.40–1.30)
BKR EGFR, CREATININE (CKD-EPI 2021): 54 mL/min/1.73m2 — ABNORMAL LOW (ref >=60)
BKR GLOBULIN: 2.8 g/dL (ref 2.0–3.9)
BKR GLUCOSE: 140 mg/dL — ABNORMAL HIGH (ref 70–100)
BKR POTASSIUM: 5.2 mmol/L (ref 3.3–5.3)
BKR PROTEIN TOTAL: 6.9 g/dL (ref 5.9–8.3)
BKR SODIUM: 137 mmol/L (ref 136–144)

## 2024-07-20 LAB — URINALYSIS-MACROSCOPIC W/REFLEX MICROSCOPIC
BKR BILIRUBIN, UA: NEGATIVE
BKR BLOOD, UA: NEGATIVE
BKR GLUCOSE, UA: NEGATIVE
BKR KETONES, UA: NEGATIVE
BKR NITRITE, UA: NEGATIVE
BKR PH, UA: 5.5 (ref 5.5–7.5)
BKR PROTEIN, UA: NEGATIVE
BKR SPECIFIC GRAVITY, UA: 1.013 (ref 1.005–1.030)
BKR UROBILINOGEN, UA: <2 mg/dL (ref <=2.0)

## 2024-07-20 LAB — URINE MICROSCOPIC     (BH GH LMW YH)
BKR RBC/HPF, UA (INSTRUMENT): 2 /HPF (ref 0–2)
BKR URINE SQUAMOUS EPITHELIAL CELLS, UA (INSTRUMENT): 2 /HPF (ref 0–5)
BKR WBC/HPF, UA (INSTRUMENT): 1 /HPF (ref 0–5)

## 2024-07-20 LAB — TSH: BKR THYROID STIMULATING HORMONE: 5.42 u[IU]/mL — ABNORMAL HIGH

## 2024-07-20 LAB — LIPID PANEL
BKR CHOLESTEROL/HDL RATIO: 3 (ref 0.0–5.0)
BKR CHOLESTEROL: 143 mg/dL
BKR HDL CHOLESTEROL: 47 mg/dL (ref >=40)
BKR LDL CHOLESTEROL SAMPSON CALCULATED: 69 mg/dL
BKR TRIGLYCERIDES: 156 mg/dL — ABNORMAL HIGH

## 2024-07-20 LAB — ALBUMIN/CREATININE PANEL, URINE, RANDOM
BKR ALBUMIN, URINE, RANDOM: 28.8 mg/L
BKR ALBUMIN/CREATININE RATIO, URINE, RANDOM: 44.7 mg/g{creat} — ABNORMAL HIGH (ref <30.0)
BKR CREATININE, URINE, RANDOM: 65 mg/dL

## 2024-07-20 LAB — VITAMIN B12: BKR VITAMIN B12: 211 pg/mL — ABNORMAL LOW (ref 232–1245)

## 2024-07-21 LAB — HEMOGLOBIN A1C
BKR ESTIMATED AVERAGE GLUCOSE: 163 mg/dL
BKR HEMOGLOBIN A1C: 7.3 % — ABNORMAL HIGH (ref 4.0–5.6)

## 2024-07-21 MED ORDER — CYANOCOBALAMIN (VIT B-12) 1,000 MCG TABLET
1000 | Freq: Every day | ORAL | 0.00 refills | 70.00000 days | Status: AC
Start: 2024-07-21 — End: ?

## 2024-07-26 LAB — LAB REPORT - SCANNED: EGFR: 55

## 2024-07-27 NOTE — Progress Notes (Signed)
 Labs 07/26/2024:  Total cholesterol 08/10/2008, triglycerides 146, HDL 42, LDL 43.  Serum glucose 111 mg, BUN 16, creatinine 1.05, eGFR 55 mL, potassium 4.9, sodium 134, LFTs normal.  Very stable labs and stable kidney function and potassium and sodium levels

## 2024-08-01 ENCOUNTER — Encounter: Payer: Self-pay | Admitting: Cardiology

## 2024-08-28 ENCOUNTER — Other Ambulatory Visit (HOSPITAL_COMMUNITY): Payer: Self-pay

## 2024-08-28 ENCOUNTER — Ambulatory Visit: Admitting: Cardiology

## 2024-08-28 ENCOUNTER — Other Ambulatory Visit: Payer: Self-pay

## 2024-08-28 DIAGNOSIS — I1A0 Resistant hypertension: Secondary | ICD-10-CM

## 2024-08-28 MED ORDER — VALSARTAN 40 MG PO TABS
40.0000 mg | ORAL_TABLET | Freq: Every day | ORAL | 3 refills | Status: AC
  Filled 2024-08-28: qty 90, 90d supply, fill #0

## 2024-10-16 ENCOUNTER — Ambulatory Visit: Admitting: Cardiology

## 2024-10-17 ENCOUNTER — Ambulatory Visit

## 2024-10-17 ENCOUNTER — Ambulatory Visit (HOSPITAL_COMMUNITY)

## 2024-10-27 ENCOUNTER — Ambulatory Visit

## 2025-01-24 ENCOUNTER — Encounter: Admit: 2025-01-24 | Payer: PRIVATE HEALTH INSURANCE | Attending: Internal Medicine | Primary: Internal Medicine

## 2025-03-05 ENCOUNTER — Inpatient Hospital Stay: Admitting: Hematology and Oncology
